# Patient Record
Sex: Female | Born: 1945 | Race: White | Hispanic: No | Marital: Married | Smoking: Never smoker
Health system: Southern US, Community
[De-identification: ages and names within clinical notes are randomized; demographics above are authoritative.]

## PROBLEM LIST (undated history)

## (undated) DIAGNOSIS — I10 Essential (primary) hypertension: Secondary | ICD-10-CM

## (undated) DIAGNOSIS — F419 Anxiety disorder, unspecified: Secondary | ICD-10-CM

## (undated) DIAGNOSIS — E785 Hyperlipidemia, unspecified: Secondary | ICD-10-CM

## (undated) DIAGNOSIS — R0602 Shortness of breath: Secondary | ICD-10-CM

## (undated) DIAGNOSIS — D649 Anemia, unspecified: Secondary | ICD-10-CM

## (undated) DIAGNOSIS — R51 Headache: Secondary | ICD-10-CM

## (undated) DIAGNOSIS — G5600 Carpal tunnel syndrome, unspecified upper limb: Secondary | ICD-10-CM

## (undated) DIAGNOSIS — F32A Depression, unspecified: Secondary | ICD-10-CM

## (undated) DIAGNOSIS — Z8719 Personal history of other diseases of the digestive system: Secondary | ICD-10-CM

## (undated) DIAGNOSIS — M542 Cervicalgia: Secondary | ICD-10-CM

## (undated) DIAGNOSIS — R55 Syncope and collapse: Secondary | ICD-10-CM

## (undated) DIAGNOSIS — G43909 Migraine, unspecified, not intractable, without status migrainosus: Secondary | ICD-10-CM

## (undated) DIAGNOSIS — K922 Gastrointestinal hemorrhage, unspecified: Secondary | ICD-10-CM

## (undated) DIAGNOSIS — E119 Type 2 diabetes mellitus without complications: Secondary | ICD-10-CM

## (undated) DIAGNOSIS — G4730 Sleep apnea, unspecified: Secondary | ICD-10-CM

## (undated) DIAGNOSIS — R011 Cardiac murmur, unspecified: Secondary | ICD-10-CM

## (undated) DIAGNOSIS — I639 Cerebral infarction, unspecified: Secondary | ICD-10-CM

## (undated) DIAGNOSIS — J449 Chronic obstructive pulmonary disease, unspecified: Secondary | ICD-10-CM

## (undated) DIAGNOSIS — I495 Sick sinus syndrome: Secondary | ICD-10-CM

## (undated) DIAGNOSIS — I341 Nonrheumatic mitral (valve) prolapse: Secondary | ICD-10-CM

## (undated) DIAGNOSIS — I4891 Unspecified atrial fibrillation: Secondary | ICD-10-CM

## (undated) DIAGNOSIS — I499 Cardiac arrhythmia, unspecified: Secondary | ICD-10-CM

## (undated) DIAGNOSIS — F329 Major depressive disorder, single episode, unspecified: Secondary | ICD-10-CM

## (undated) DIAGNOSIS — K52831 Collagenous colitis: Secondary | ICD-10-CM

## (undated) DIAGNOSIS — K219 Gastro-esophageal reflux disease without esophagitis: Secondary | ICD-10-CM

## (undated) DIAGNOSIS — M1990 Unspecified osteoarthritis, unspecified site: Secondary | ICD-10-CM

## (undated) HISTORY — PX: TONSILLECTOMY: SUR1361

## (undated) HISTORY — PX: BACK SURGERY: SHX140

## (undated) HISTORY — PX: BREAST CYST ASPIRATION: SHX578

## (undated) HISTORY — PX: LUMBAR LAMINECTOMY: SHX95

## (undated) HISTORY — PX: KNEE ARTHROSCOPY: SHX127

## (undated) HISTORY — PX: JOINT REPLACEMENT: SHX530

## (undated) SURGERY — ESOPHAGOGASTRODUODENOSCOPY (EGD) WITH PROPOFOL
Anesthesia: Monitor Anesthesia Care

## (undated) SURGERY — BRONCHOSCOPY, FLEXIBLE
Anesthesia: Moderate Sedation

---

## 1977-10-06 HISTORY — PX: ABDOMINAL HYSTERECTOMY: SHX81

## 1992-10-06 HISTORY — PX: NASAL SINUS SURGERY: SHX719

## 2004-09-16 ENCOUNTER — Ambulatory Visit: Payer: Self-pay | Admitting: Family Medicine

## 2004-12-27 ENCOUNTER — Emergency Department: Payer: Self-pay | Admitting: General Practice

## 2005-03-29 ENCOUNTER — Emergency Department: Payer: Self-pay | Admitting: Emergency Medicine

## 2005-03-29 ENCOUNTER — Other Ambulatory Visit: Payer: Self-pay

## 2005-10-06 DIAGNOSIS — G4730 Sleep apnea, unspecified: Secondary | ICD-10-CM

## 2005-10-06 HISTORY — DX: Sleep apnea, unspecified: G47.30

## 2006-05-28 ENCOUNTER — Ambulatory Visit: Payer: Self-pay | Admitting: Family Medicine

## 2007-03-02 ENCOUNTER — Ambulatory Visit: Payer: Self-pay | Admitting: Unknown Physician Specialty

## 2007-05-11 ENCOUNTER — Ambulatory Visit: Payer: Self-pay | Admitting: Rheumatology

## 2007-06-05 ENCOUNTER — Inpatient Hospital Stay: Payer: Self-pay | Admitting: Internal Medicine

## 2007-06-05 ENCOUNTER — Other Ambulatory Visit: Payer: Self-pay

## 2007-07-22 ENCOUNTER — Ambulatory Visit: Payer: Self-pay | Admitting: Family Medicine

## 2007-09-08 ENCOUNTER — Ambulatory Visit: Payer: Self-pay | Admitting: Cardiology

## 2007-10-04 ENCOUNTER — Ambulatory Visit: Payer: Self-pay | Admitting: Family Medicine

## 2007-10-13 ENCOUNTER — Ambulatory Visit: Payer: Self-pay | Admitting: Gastroenterology

## 2008-01-03 ENCOUNTER — Ambulatory Visit: Payer: Self-pay | Admitting: Urology

## 2008-03-02 ENCOUNTER — Ambulatory Visit: Payer: Self-pay | Admitting: Rheumatology

## 2008-07-25 ENCOUNTER — Ambulatory Visit: Payer: Self-pay | Admitting: Family Medicine

## 2008-10-06 DIAGNOSIS — K52831 Collagenous colitis: Secondary | ICD-10-CM

## 2008-10-06 HISTORY — DX: Collagenous colitis: K52.831

## 2009-06-01 ENCOUNTER — Emergency Department: Payer: Self-pay | Admitting: Internal Medicine

## 2009-10-03 ENCOUNTER — Ambulatory Visit: Payer: Self-pay | Admitting: Family Medicine

## 2009-10-06 HISTORY — PX: CHOLECYSTECTOMY: SHX55

## 2010-02-15 ENCOUNTER — Emergency Department: Payer: Self-pay | Admitting: Emergency Medicine

## 2010-02-28 ENCOUNTER — Ambulatory Visit: Payer: Self-pay | Admitting: Family Medicine

## 2010-03-26 ENCOUNTER — Ambulatory Visit: Payer: Self-pay | Admitting: Gastroenterology

## 2010-03-26 ENCOUNTER — Encounter: Payer: Self-pay | Admitting: Gastroenterology

## 2010-04-09 ENCOUNTER — Encounter: Payer: Self-pay | Admitting: Gastroenterology

## 2010-04-15 ENCOUNTER — Ambulatory Visit: Payer: Self-pay | Admitting: Surgery

## 2010-05-08 ENCOUNTER — Encounter: Payer: Self-pay | Admitting: Gastroenterology

## 2010-05-08 ENCOUNTER — Inpatient Hospital Stay: Payer: Self-pay | Admitting: Surgery

## 2010-05-09 LAB — PATHOLOGY REPORT

## 2010-05-29 ENCOUNTER — Encounter (INDEPENDENT_AMBULATORY_CARE_PROVIDER_SITE_OTHER): Payer: Self-pay | Admitting: *Deleted

## 2010-07-12 ENCOUNTER — Ambulatory Visit: Payer: Self-pay | Admitting: Gastroenterology

## 2010-08-15 ENCOUNTER — Telehealth: Payer: Self-pay | Admitting: Gastroenterology

## 2010-09-10 ENCOUNTER — Ambulatory Visit: Payer: Self-pay | Admitting: Gastroenterology

## 2010-10-06 HISTORY — PX: CARDIAC CATHETERIZATION: SHX172

## 2010-10-11 ENCOUNTER — Ambulatory Visit: Payer: Self-pay | Admitting: Family Medicine

## 2010-11-07 NOTE — Progress Notes (Signed)
Summary: update  Phone Note Call from Patient Call back at Home Phone [phone]   Caller: Patient Call For: Dr. Christella Hartigan Reason for Call: Talk to Nurse Summary of Call: would like to discuss/update after stopping Entocort and [facility]'s Colon Health Initial call taken by: Vallarie Mare,  August 15, 2010 9:04 AM  Follow-up for Phone Call        Patient calling to report she has been off Entocort and Phillip's colon health since 07/21/10.She had done pretty well until last Friday. Last Friday she had 3 diarrhea stools. She has had one diarrhea stool each day since then until today. Today she has had 3 diarrhea stools already.. She reports she has not had a normal BM since last Friday. Please, advise. Follow-up by: Jesse Fall RN,  August 15, 2010 9:19 AM  Additional Follow-up for Phone Call Additional follow up Details #1::        restart one entocort pill a day and needs rov in 3-4 weeks, (call sooner if diarrhea worsens). Additional Follow-up by: Rachael Fee MD,  August 15, 2010 9:28 AM    Additional Follow-up for Phone Call Additional follow up Details #2::    Patient notified to restart entocort one tablet daily as per Dr. Christella Hartigan. She will call if diarrhea gets worse. Patient scheduled on 09/10/10 at 2:45 PM with Dr. Christella Hartigan. Follow-up by: Jesse Fall RN,  August 15, 2010 1:59 PM  c

## 2010-11-07 NOTE — Letter (Signed)
Summary: [facility]   Imported By: Lester Ross 07/18/2010 09:36:42  _____________________________________________________________________  External Attachment:    Type:   Image     Comment:   External Document

## 2010-11-07 NOTE — Assessment & Plan Note (Signed)
History of Present Illness Visit Type: new patient  Primary GI MD: Rob Bunting MD Primary Provider: Jerl Mina, MD  Requesting Provider: Dr. Dierdre Forth Chief Complaint: LLQ abd pain  History of Present Illness:     very pleasant 65 year old woman who is bothered by terrible profuse diarrhea that lasted for about 3 months and only improved after she had her gallbladder removed. Macroscopic cholecystectomy about 2 months ago showed chronic cholecystitis with adhesions throughout her abdomen.   Workup for this included colonoscopy and also EGD. Biopsies of her colonShowed collagenous colitis.Marland Kitchen     she was having dirrhea, 15 times a day for about 3 months. This was very new for her. She underwent colonoscoyp in June, tics noted, random biopsies showed collagenous colitis.  Pentasa didn't help at all.  She was put on entocort, she could only take 2 pills a day.  She is still on one pill a day. She is actually now pretty constipated.  She will have 3 bms a week.  She takes 2 oxycodone a week for RA pains.           Current Medications (verified): 1)  Zocor 10 Mg Tabs (Simvastatin) .Marland Kitchen.. 1 By Mouth Once Daily 2)  Sertraline Hcl 100 Mg Tabs (Sertraline Hcl) .Marland Kitchen.. 1 1/2 By Mouth Once Daily 3)  Prilosec 20 Mg Cpdr (Omeprazole) .Marland Kitchen.. 1 By Mouth Once Daily 4)  Metoprolol Tartrate 50 Mg Tabs (Metoprolol Tartrate) .Marland Kitchen.. 1 By Mouth Once Daily 5)  Losartan Potassium 100 Mg Tabs (Losartan Potassium) .Marland Kitchen.. 1 By Mouth Once Daily 6)  Zoloft 100 Mg Tabs (Sertraline Hcl) .Marland Kitchen.. 1 1/2 By Mouth Once Daily 7)  Entocort Ec 3 Mg Xr24h-Cap (Budesonide) .... One Capsule By Mouth Once Daily 8)  Phillips Colon Health  Caps (Probiotic Product) .... One Capsule By Mouth Once Daily 9)  Gas-X 80 Mg Chew (Simethicone) .... As Needed  Allergies (verified): 1)  ! Sulfa 2)  ! Inderal 3)  ! Flagyl 4)  ! Relafen 5)  ! Plaquenil 6)  ! * Entocort  Past History:  Past Medical History: Chronic Headaches mitral  valve prolapse Arthritis Hypertension carpal tunnel Obesity Depression Diverticulosis GI Bleed Hemorrhoids Sleep Apnea   Past Surgical History: Rotator Cuff Repair laminectomy Breast Biopsy Hysterectomy Knee Arthroscopy Tonsillectomy cholecystectomy 2001  Family History: no colon cancer  Social History: nonsmoker, nondrinker  Review of Systems       Pertinent positive and negative review of systems were noted in the above HPI and GI specific review of systems.  All other review of systems was otherwise negative.   Vital Signs:  Patient profile:   65 year old female Height:      66 inches Weight:      201 pounds BMI:     32.56 BSA:     2.01 Pulse rate:   62 / minute Pulse rhythm:   regular BP sitting:   138 / 76  (left arm) Cuff size:   regular  Vitals Entered By: Ok Anis CMA (July 12, 2010 2:20 PM)  Physical Exam  Additional Exam:  Constitutional: generally well appearing Psychiatric: alert and oriented times 3 Eyes: extraocular movements intact Mouth: oropharynx moist, no lesions Neck: supple, no lymphadenopathy Cardiovascular: heart regular rate and rythm Lungs: CTA bilaterally Abdomen: soft, non-tender, non-distended, no obvious ascites, no peritoneal signs, normal bowel sounds Extremities: no lower extremity edema bilaterally Skin: no lesions on visible extremities    Impression & Recommendations:  Problem # 1:  recent  profuse diarrhea, cholecystectomy it is unusual for acute diarrhea to improve after cholecystectomy. I have also never seen sick gallbladder cause collagenous colitis as the biopsy of her colon did show. Perhaps her chronic cholecystitis and her collagenous colitis for unrelated but both truly present. Perhaps her collagenous colitis was the result of her cholecystitis, gallbladder pathology as the clinical timing of her improvement does seem to suggest. She is only taking a single Entocort a day and feels very good. She is also  probiotic once daily. I recommended she stop both of these for now to see how she feels. I explained to her that collagenous colitis can come intermittently. She has a return of her significant diarrhea she is to call immediately, otherwise she will report on her symptoms in 4-5 weeks by phone.  Patient Instructions: 1)  Stop the probiotic. 2)  Stop the entocort. 3)  Call Dr. Christella Hartigan office in 4-5 weeks to report on symptoms (diarrhea), much sooner if you get worse. 4)  A copy of this information will be sent to Dr. Dierdre Forth, and Dr. Burnett Sheng. 5)  The medication list was reviewed and reconciled.  All changed / newly prescribed medications were explained.  A complete medication list was provided to the patient / caregiver.

## 2010-11-07 NOTE — Procedures (Signed)
Summary: Donnelly Stager MD  [facility]/Martin Cyndia Skeeters MD   Imported By: Lester [place] 07/18/2010 09:39:52  _____________________________________________________________________  External Attachment:    Type:   Image     Comment:   External Document

## 2010-11-07 NOTE — Letter (Signed)
Summary: New Patient letter  [facility]  [address] [place], [place] [address]   Phone: [phone]  Fax: [phone]       05/29/2010 MRN: [id number]  Caitlin Leonard [address] [place], [place]  [address]  Dear Caitlin Leonard,  Welcome to the Gastroenterology Division at [facility].    You are scheduled to see Dr.  Christella Hartigan  on 07/12/1967 at 2:15 pmon the 3rd floor at [facility], [address]. Please bring Colon and Path report from Dr Marva Panda.   We ask that you try to arrive at  our office 15 minutes prior to your appointment time to allow for check-in. We would like you to complete the enclosed self-administered evaluation form prior to your visit and bring it with you on the day of your appointment.  We will review it with you.  Also, please bring a complete list of all your medications or, if you prefer, bring the medication bottles and we will list them.  Please bring your insurance card so that we may make a copy of it.  If your insurance requires a referral to see a specialist, please bring your referral form from your primary care physician.  Co-payments are due at the time of your visit and may be paid by cash, check or credit card.     Your office visit will consist of a consult with your physician (includes a physical exam), any laboratory testing he/she may order, scheduling of any necessary diagnostic testing (e.g. x-ray, ultrasound, CT-scan), and scheduling of a procedure (e.g. Endoscopy, Colonoscopy) if required.  Please allow enough time on your schedule to allow for any/all of these possibilities.    If you cannot keep your appointment, please call [phone] to cancel or reschedule prior to your appointment date.  This allows [place] the opportunity to schedule an appointment for another patient in need of care.  If you do not cancel or reschedule by 5 p.m. the business day prior to your appointment date, you will be charged a $50.00 late  cancellation/no-show fee.    Thank you for choosing [facility] for your medical needs.  We appreciate the opportunity to care for you.  Please visit [place] at our website  to learn more about our practice.                     Sincerely,                                                             The Gastroenterology Division

## 2010-11-07 NOTE — Assessment & Plan Note (Signed)
  Review of gastrointestinal problems: 1. Collagenous colitis, diagnosed by colonoscopy, biopsies randomly June 2011 Dr. Marva Panda.  Entocort, one pill a day is very effective.    History of Present Illness Visit Type: Follow-up Visit Primary GI MD: Rob Bunting MD Primary Provider: Jerl Mina, MD  Requesting Provider: Dr. Dierdre Forth Chief Complaint: colitis History of Present Illness:     very pleasant 65 year old woman whom I last saw about 2 months ago.  she did well for about one month but then diarrhea returned and she restarted entocort at 1 pill a day about one month ago and the dirrhea responded in 2 days. She still has intermittent lower left sided abd pains after eating.  But stools are back to normal.           Current Medications (verified): 1)  Zocor 10 Mg Tabs (Simvastatin) .Marland Kitchen.. 1 By Mouth Once Daily 2)  Prilosec 20 Mg Cpdr (Omeprazole) .Marland Kitchen.. 1 By Mouth Once Daily 3)  Metoprolol Tartrate 50 Mg Tabs (Metoprolol Tartrate) .Marland Kitchen.. 1 By Mouth Once Daily 4)  Amlodipine Besylate 10 Mg Tabs (Amlodipine Besylate) .... Once Daily 5)  Prozac 20 Mg Caps (Fluoxetine Hcl) .... Once Daily 6)  Gas-X 80 Mg Chew (Simethicone) .... As Needed 7)  Entocort Ec 3 Mg Xr24h-Cap (Budesonide) .... Once Daily  Allergies: 1)  ! Sulfa 2)  ! Inderal 3)  ! Flagyl 4)  ! Plaquenil  Vital Signs:  Patient profile:   65 year old female Height:      66 inches Weight:      203.50 pounds BMI:     32.96 Pulse rate:   72 / minute Pulse rhythm:   regular BP sitting:   120 / 72  (left arm) Cuff size:   regular  Vitals Entered By: June McMurray CMA Duncan Dull) (September 10, 2010 2:39 PM)  Physical Exam  Additional Exam:  Constitutional: generally well appearing Psychiatric: alert and oriented times 3 Abdomen: soft, non-tender, non-distended, normal bowel sounds    Impression & Recommendations:  Problem # 1:  collagenous colitis she will stay on Entocort, one pill a day for the next 2 weeks and  then she will stop. If the symptoms returned she knows she can restart Entocort but she will also call my office to let me know what is going on.  Patient Instructions: 1)  Stay on one entocort pill a day for 2 more weeks, then stop. 2)  If diarrhea symptoms return,  convincingly, restart one entocort pill a day and call Dr. Christella Hartigan' office. 3)  A copy of this information will be sent to Dr. Burnett Sheng. 4)  The medication list was reviewed and reconciled.  All changed / newly prescribed medications were explained.  A complete medication list was provided to the patient / caregiver.

## 2010-11-07 NOTE — Procedures (Signed)
Summary: Glendon Axe MD  Colon/Martin Cyndia Skeeters MD   Imported By: Lester [place] 07/18/2010 09:41:48  _____________________________________________________________________  External Attachment:    Type:   Image     Comment:   External Document

## 2010-11-07 NOTE — Op Note (Signed)
Summary: Quentin Ore MD  Quentin Ore MD   Imported By: Lester [place]itt[place] 07/18/2010 09:35:16  _____________________________________________________________________  External Attachment:    Type:   Image     Comment:   External Document

## 2011-07-02 ENCOUNTER — Emergency Department: Payer: Self-pay | Admitting: Unknown Physician Specialty

## 2011-08-15 ENCOUNTER — Ambulatory Visit: Payer: Self-pay | Admitting: Cardiology

## 2011-08-15 ENCOUNTER — Observation Stay: Payer: Self-pay | Admitting: Student

## 2011-09-05 ENCOUNTER — Encounter: Payer: Self-pay | Admitting: Unknown Physician Specialty

## 2011-09-06 ENCOUNTER — Encounter: Payer: Self-pay | Admitting: Unknown Physician Specialty

## 2011-10-07 ENCOUNTER — Encounter: Payer: Self-pay | Admitting: Unknown Physician Specialty

## 2011-11-06 ENCOUNTER — Ambulatory Visit: Payer: Self-pay | Admitting: Family Medicine

## 2011-11-07 ENCOUNTER — Encounter: Payer: Self-pay | Admitting: Unknown Physician Specialty

## 2011-12-05 ENCOUNTER — Encounter (HOSPITAL_COMMUNITY): Payer: Self-pay | Admitting: Pharmacy Technician

## 2011-12-11 ENCOUNTER — Other Ambulatory Visit: Payer: Self-pay | Admitting: Orthopedic Surgery

## 2011-12-11 ENCOUNTER — Encounter (HOSPITAL_COMMUNITY)
Admission: RE | Admit: 2011-12-11 | Discharge: 2011-12-11 | Disposition: A | Discharge status: Home / Self Care | Payer: BC Managed Care – PPO | Source: Ambulatory Visit | Attending: Orthopedic Surgery | Admitting: Orthopedic Surgery

## 2011-12-11 ENCOUNTER — Other Ambulatory Visit: Payer: Self-pay

## 2011-12-11 ENCOUNTER — Encounter (HOSPITAL_COMMUNITY): Payer: Self-pay

## 2011-12-11 HISTORY — DX: Hyperlipidemia, unspecified: E78.5

## 2011-12-11 HISTORY — DX: Unspecified osteoarthritis, unspecified site: M19.90

## 2011-12-11 HISTORY — DX: Shortness of breath: R06.02

## 2011-12-11 HISTORY — DX: Cardiac arrhythmia, unspecified: I49.9

## 2011-12-11 HISTORY — DX: Gastro-esophageal reflux disease without esophagitis: K21.9

## 2011-12-11 HISTORY — DX: Anxiety disorder, unspecified: F41.9

## 2011-12-11 HISTORY — DX: Cardiac murmur, unspecified: R01.1

## 2011-12-11 HISTORY — DX: Major depressive disorder, single episode, unspecified: F32.9

## 2011-12-11 HISTORY — DX: Syncope and collapse: R55

## 2011-12-11 HISTORY — DX: Headache: R51

## 2011-12-11 HISTORY — DX: Essential (primary) hypertension: I10

## 2011-12-11 HISTORY — DX: Personal history of other diseases of the digestive system: Z87.19

## 2011-12-11 HISTORY — DX: Depression, unspecified: F32.A

## 2011-12-11 HISTORY — DX: Sleep apnea, unspecified: G47.30

## 2011-12-11 HISTORY — DX: Collagenous colitis: K52.831

## 2011-12-11 LAB — PROTIME-INR
INR: 0.96 (ref 0.00–1.49)
Prothrombin Time: 13 seconds (ref 11.6–15.2)

## 2011-12-11 LAB — DIFFERENTIAL
Basophils Relative: 1 % (ref 0–1)
Eosinophils Absolute: 0.2 10*3/uL (ref 0.0–0.7)
Lymphs Abs: 2.3 10*3/uL (ref 0.7–4.0)
Neutro Abs: 7.4 10*3/uL (ref 1.7–7.7)
Neutrophils Relative %: 69 % (ref 43–77)

## 2011-12-11 LAB — URINALYSIS, ROUTINE W REFLEX MICROSCOPIC
Bilirubin Urine: NEGATIVE
Ketones, ur: NEGATIVE mg/dL
Nitrite: NEGATIVE
Specific Gravity, Urine: 1.019 (ref 1.005–1.030)
Urobilinogen, UA: 1 mg/dL (ref 0.0–1.0)

## 2011-12-11 LAB — TYPE AND SCREEN
ABO/RH(D): O POS
Antibody Screen: NEGATIVE

## 2011-12-11 LAB — COMPREHENSIVE METABOLIC PANEL
Alkaline Phosphatase: 91 U/L (ref 39–117)
BUN: 14 mg/dL (ref 6–23)
Creatinine, Ser: 0.77 mg/dL (ref 0.50–1.10)
GFR calc Af Amer: >90 mL/min (ref >90)
Glucose, Bld: 114 mg/dL — ABNORMAL HIGH (ref 70–99)
Potassium: 4 mEq/L (ref 3.5–5.1)
Total Protein: 7.5 g/dL (ref 6.0–8.3)

## 2011-12-11 LAB — APTT: aPTT: 26 seconds (ref 24–37)

## 2011-12-11 LAB — CBC
HCT: 43.8 % (ref 36.0–46.0)
Hemoglobin: 15.1 g/dL — ABNORMAL HIGH (ref 12.0–15.0)
MCH: 31.9 pg (ref 26.0–34.0)
MCHC: 34.5 g/dL (ref 30.0–36.0)
MCV: 92.4 fL (ref 78.0–100.0)
RDW: 13 % (ref 11.5–15.5)

## 2011-12-11 LAB — SURGICAL PCR SCREEN
MRSA, PCR: NEGATIVE
Staphylococcus aureus: NEGATIVE

## 2011-12-11 LAB — ABO/RH: ABO/RH(D): O POS

## 2011-12-11 NOTE — Pre-Procedure Instructions (Signed)
20 Caitlin Leonard  12/11/2011   Your procedure is scheduled on:  Monday, March 18  Report to [facility] at 0530 AM.  Call this number if you have problems the morning of surgery: [phone]   Remember:   Do not eat food:After Midnight.  May have clear liquids: up to 4 Hours before arrival.(1:30 am)  Clear liquids include soda, tea, black coffee, apple or grape juice, broth.  Take these medicines the morning of surgery with A SIP OF WATER: Amlodipine,Prozac,Metoprolol,Omeprazole,Hydrocodone   Do not wear jewelry, make-up or nail polish.  Do not wear lotions, powders, or perfumes. You may wear deodorant.  Do not shave 48 hours prior to surgery.  Do not bring valuables to the hospital.  Contacts, dentures or bridgework may not be worn into surgery.  Leave suitcase in the car. After surgery it may be brought to your room.  For patients admitted to the hospital, checkout time is 11:00 AM the day of discharge.     Special Instructions: Incentive Spirometry - Practice and bring it with you on the day of surgery. and CHG Shower Use Special Wash: 1/2 bottle night before surgery and 1/2 bottle morning of surgery.   Please read over the following fact sheets that you were given: Pain Booklet, Coughing and Deep Breathing, Blood Transfusion Information, Total Joint Packet, MRSA Information and Surgical Site Infection Prevention

## 2011-12-12 LAB — URINE CULTURE

## 2011-12-15 NOTE — Progress Notes (Signed)
RECEIVED REQUESTED MATERIAL [facility].   MATERIAL PLACED ON THE CHART.   CHART GIVEN TO ALLISON FOR REVIEW.

## 2011-12-15 NOTE — Progress Notes (Signed)
Spoke with Morrie Sheldon at the [facility] [phone]...requested neurology notes be faxed to me at [phone].  She will search for notes and fax what she finds.

## 2011-12-15 NOTE — Consult Note (Addendum)
Anesthesia:  Patient is a 66 year old female scheduled for a left TKA on 12/22/11.  Her history includes HTN, OSA, murmur with history of MVP, dysrhythmia, migraine headaches, anxiety, HLD, cataracts, GERD, asthma, non-smoker, IBS, colitis, depression, syncope, and SOB.  Her PCP is Dr. Jerl Mina.  EKG from 12/11/11 showed SB, moderate voltage criteria for LVH (may be normal variant), inferior and anterior T wave abnormality.  These changes are at least somewhat apparent on her EKG from [facility] from 11/22/09, but do appear more pronounced on her current EKG.  She denies CP, SOB.  Until recently, when she had to take care of her parents with dementia, she was going to the riding a stationary bicycle for 30-60 minutes without CV symptoms.    Her Cardiologist is Dr. Harold Hedge.  He last saw her on 09/16/11.  She has been followed for reports of syncope with no cardiac etiology found.  Referral to Neurology was made.    She has had a prior cath at [facility] on 09/08/07 (due to a finding of a anterobasilar perfusion defect on on Adenosine myoview in September 2008.  EF 51%.) that showed normal coronary anatomy.  A 24-hr Holter test showed no significant arrhythmias in August of 2008 and a 30 day event monitor in 2010 revealing no significant arrhythmias.  Her last echo on 05/30/09 showed normal Lv systolic function, EF 55%, mild MI/TI.  Carotid dopplers in 2010 also showed no significant ICA stenosis. According to his note, brain MRI only showed a tiny venous angioma.    Neurology notes from [facility] were received this afternoon, but were from > 10 year ago.  Subsequently, I called Ms. Kittelson and learned she has most recently been followed by Dr. Terrace Arabia at [facility] just a few months ago.  She reports that every 3-4 years she has multiple episodes of brief syncope lasting over a few months.  Her most recent exacerbation was in September 2012 when she had approximately 6 or 7  episodes.  These are preceded by 1-2 minutes of N/V and flushing.  If she is able to lie down once these symptoms develop, she can usually avoid passing out.  Her last episode was 2 weeks ago while straining to have a BM.  The episode lasted for a few seconds.    I will follow-up once her most recent Neurology records are available.    Addendum: 12/17/11 1645  I reviewed records from Neurologist Dr. Terrace Arabia.  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  Dr. Terrace Arabia has not made any specific recommendations so far.  I reviewed her cardiac and syncopal history with Anesthesiologist Dr. Noreene Larsson.  With negative cardiac work-up, normal carotid duplex in 2010, and no acute changes on MRI, would plan to proceed if no new symptomology.

## 2011-12-21 ENCOUNTER — Other Ambulatory Visit: Payer: Self-pay | Admitting: Orthopedic Surgery

## 2011-12-21 MED ORDER — CEFAZOLIN SODIUM-DEXTROSE 2-3 GM-% IV SOLR
2.0000 g | INTRAVENOUS | Status: AC
  Administered 2011-12-22: 2 g via INTRAVENOUS
  Filled 2011-12-21: qty 50

## 2011-12-22 ENCOUNTER — Encounter (HOSPITAL_COMMUNITY): Payer: Self-pay | Admitting: Vascular Surgery

## 2011-12-22 ENCOUNTER — Inpatient Hospital Stay (HOSPITAL_COMMUNITY)
Admission: RE | Admit: 2011-12-22 | Discharge: 2011-12-26 | DRG: 209 | Disposition: A | Discharge status: Home Health | Payer: BC Managed Care – PPO | Source: Ambulatory Visit | Attending: Orthopedic Surgery | Admitting: Orthopedic Surgery

## 2011-12-22 ENCOUNTER — Ambulatory Visit (HOSPITAL_COMMUNITY): Payer: BC Managed Care – PPO | Admitting: Vascular Surgery

## 2011-12-22 ENCOUNTER — Encounter (HOSPITAL_COMMUNITY): Payer: Self-pay | Admitting: *Deleted

## 2011-12-22 ENCOUNTER — Encounter (HOSPITAL_COMMUNITY)
Admission: RE | Disposition: A | Discharge status: Home Health | Payer: Self-pay | Source: Ambulatory Visit | Attending: Orthopedic Surgery

## 2011-12-22 DIAGNOSIS — M1990 Unspecified osteoarthritis, unspecified site: Secondary | ICD-10-CM | POA: Diagnosis present

## 2011-12-22 DIAGNOSIS — G43909 Migraine, unspecified, not intractable, without status migrainosus: Secondary | ICD-10-CM | POA: Diagnosis present

## 2011-12-22 DIAGNOSIS — J45909 Unspecified asthma, uncomplicated: Secondary | ICD-10-CM | POA: Diagnosis present

## 2011-12-22 DIAGNOSIS — I10 Essential (primary) hypertension: Secondary | ICD-10-CM | POA: Diagnosis present

## 2011-12-22 DIAGNOSIS — K219 Gastro-esophageal reflux disease without esophagitis: Secondary | ICD-10-CM | POA: Diagnosis present

## 2011-12-22 DIAGNOSIS — F411 Generalized anxiety disorder: Secondary | ICD-10-CM | POA: Diagnosis present

## 2011-12-22 DIAGNOSIS — M1710 Unilateral primary osteoarthritis, unspecified knee: Principal | ICD-10-CM | POA: Diagnosis present

## 2011-12-22 DIAGNOSIS — Z79899 Other long term (current) drug therapy: Secondary | ICD-10-CM

## 2011-12-22 DIAGNOSIS — D62 Acute posthemorrhagic anemia: Secondary | ICD-10-CM | POA: Diagnosis not present

## 2011-12-22 DIAGNOSIS — M1712 Unilateral primary osteoarthritis, left knee: Secondary | ICD-10-CM

## 2011-12-22 DIAGNOSIS — G4730 Sleep apnea, unspecified: Secondary | ICD-10-CM | POA: Diagnosis present

## 2011-12-22 DIAGNOSIS — E785 Hyperlipidemia, unspecified: Secondary | ICD-10-CM | POA: Diagnosis present

## 2011-12-22 HISTORY — PX: TOTAL KNEE ARTHROPLASTY: SHX125

## 2011-12-22 SURGERY — ARTHROPLASTY, KNEE, TOTAL
Anesthesia: Regional | Site: Knee | Laterality: Left | Wound class: Clean

## 2011-12-22 MED ORDER — SODIUM CHLORIDE 0.9 % IV SOLN
INTRAVENOUS | Status: DC
  Administered 2011-12-22 – 2011-12-23 (×3): via INTRAVENOUS

## 2011-12-22 MED ORDER — ALUM & MAG HYDROXIDE-SIMETH 200-200-20 MG/5ML PO SUSP: 30.0000 mL | ORAL | Status: DC | PRN

## 2011-12-22 MED ORDER — METOPROLOL SUCCINATE ER 50 MG PO TB24
50.0000 mg | ORAL_TABLET | Freq: Every day | ORAL | Status: DC
  Administered 2011-12-23 – 2011-12-26 (×4): 50 mg via ORAL
  Filled 2011-12-22 (×5): qty 1

## 2011-12-22 MED ORDER — BUPIVACAINE 0.25 % ON-Q PUMP SINGLE CATH 300ML
INJECTION | Status: DC | PRN
  Administered 2011-12-22: 300 mL

## 2011-12-22 MED ORDER — BUPIVACAINE-EPINEPHRINE 0.25% -1:200000 IJ SOLN
INTRAMUSCULAR | Status: DC | PRN
  Administered 2011-12-22: 20 mL

## 2011-12-22 MED ORDER — ACETAMINOPHEN 650 MG RE SUPP: 650.0000 mg | Freq: Four times a day (QID) | RECTAL | Status: DC | PRN

## 2011-12-22 MED ORDER — PANTOPRAZOLE SODIUM 40 MG PO TBEC
40.0000 mg | DELAYED_RELEASE_TABLET | Freq: Every day | ORAL | Status: DC
  Administered 2011-12-23 – 2011-12-26 (×4): 40 mg via ORAL
  Filled 2011-12-22 (×3): qty 1

## 2011-12-22 MED ORDER — BUPIVACAINE ON-Q PAIN PUMP (FOR ORDER SET NO CHG): INJECTION | Status: DC

## 2011-12-22 MED ORDER — HYDROMORPHONE HCL PF 1 MG/ML IJ SOLN
0.5000 mg | INTRAMUSCULAR | Status: DC | PRN
  Administered 2011-12-23 (×2): 1 mg via INTRAVENOUS
  Filled 2011-12-22 (×3): qty 1

## 2011-12-22 MED ORDER — PROPOFOL 10 MG/ML IV EMUL
INTRAVENOUS | Status: DC | PRN
  Administered 2011-12-22: 150 mg via INTRAVENOUS
  Administered 2011-12-22: 50 mg via INTRAVENOUS

## 2011-12-22 MED ORDER — LORAZEPAM 1 MG PO TABS: 1.0000 mg | ORAL_TABLET | Freq: Every day | ORAL | Status: DC | PRN

## 2011-12-22 MED ORDER — ONDANSETRON HCL 4 MG PO TABS
4.0000 mg | ORAL_TABLET | Freq: Four times a day (QID) | ORAL | Status: DC | PRN
  Administered 2011-12-25: 4 mg via ORAL
  Filled 2011-12-22: qty 1

## 2011-12-22 MED ORDER — FLUOXETINE HCL 20 MG PO CAPS
40.0000 mg | ORAL_CAPSULE | Freq: Every day | ORAL | Status: DC
Start: 2011-12-22 — End: 2011-12-26
  Administered 2011-12-23 – 2011-12-26 (×4): 40 mg via ORAL
  Filled 2011-12-22 (×5): qty 2

## 2011-12-22 MED ORDER — FLEET ENEMA 7-19 GM/118ML RE ENEM: 1.0000 | ENEMA | Freq: Once | RECTAL | Status: AC | PRN

## 2011-12-22 MED ORDER — METHOCARBAMOL 100 MG/ML IJ SOLN
500.0000 mg | Freq: Four times a day (QID) | INTRAVENOUS | Status: DC | PRN
  Administered 2011-12-22: 500 mg via INTRAVENOUS
  Filled 2011-12-22: qty 5

## 2011-12-22 MED ORDER — EPHEDRINE SULFATE 50 MG/ML IJ SOLN
INTRAMUSCULAR | Status: DC | PRN
  Administered 2011-12-22: 5 mg via INTRAVENOUS
  Administered 2011-12-22: 10 mg via INTRAVENOUS

## 2011-12-22 MED ORDER — CHLORHEXIDINE GLUCONATE 4 % EX LIQD
60.0000 mL | Freq: Once | CUTANEOUS | Status: DC
  Filled 2011-12-22: qty 60

## 2011-12-22 MED ORDER — BUPIVACAINE-EPINEPHRINE PF 0.5-1:200000 % IJ SOLN
INTRAMUSCULAR | Status: DC | PRN
  Administered 2011-12-22: 30 mL

## 2011-12-22 MED ORDER — DIPHENHYDRAMINE HCL 12.5 MG/5ML PO ELIX: 12.5000 mg | ORAL_SOLUTION | ORAL | Status: DC | PRN

## 2011-12-22 MED ORDER — ACETAMINOPHEN 10 MG/ML IV SOLN
1000.0000 mg | Freq: Four times a day (QID) | INTRAVENOUS | Status: DC
  Administered 2011-12-22: 1000 mg via INTRAVENOUS
  Filled 2011-12-22 (×3): qty 100

## 2011-12-22 MED ORDER — BUPIVACAINE 0.25 % ON-Q PUMP SINGLE CATH 300ML
300.0000 mL | INJECTION | Status: DC
  Filled 2011-12-22 (×2): qty 300

## 2011-12-22 MED ORDER — METOCLOPRAMIDE HCL 5 MG PO TABS
5.0000 mg | ORAL_TABLET | Freq: Three times a day (TID) | ORAL | Status: DC | PRN
  Filled 2011-12-22: qty 2

## 2011-12-22 MED ORDER — CEFAZOLIN SODIUM-DEXTROSE 2-3 GM-% IV SOLR
2.0000 g | Freq: Four times a day (QID) | INTRAVENOUS | Status: AC
  Administered 2011-12-22 – 2011-12-23 (×3): 2 g via INTRAVENOUS
  Filled 2011-12-22 (×4): qty 50

## 2011-12-22 MED ORDER — SIMVASTATIN 20 MG PO TABS
20.0000 mg | ORAL_TABLET | Freq: Every evening | ORAL | Status: DC
  Administered 2011-12-23 – 2011-12-25 (×3): 20 mg via ORAL
  Filled 2011-12-22 (×5): qty 1

## 2011-12-22 MED ORDER — METOCLOPRAMIDE HCL 5 MG/ML IJ SOLN: 5.0000 mg | Freq: Three times a day (TID) | INTRAMUSCULAR | Status: DC | PRN

## 2011-12-22 MED ORDER — HYDROMORPHONE HCL PF 1 MG/ML IJ SOLN
0.2500 mg | INTRAMUSCULAR | Status: DC | PRN
  Administered 2011-12-22: 0.25 mg via INTRAVENOUS
  Administered 2011-12-22: 0.5 mg via INTRAVENOUS
  Administered 2011-12-22: 0.25 mg via INTRAVENOUS
  Administered 2011-12-22: 0.5 mg via INTRAVENOUS

## 2011-12-22 MED ORDER — ONDANSETRON HCL 4 MG/2ML IJ SOLN
INTRAMUSCULAR | Status: DC | PRN
  Administered 2011-12-22: 4 mg via INTRAVENOUS

## 2011-12-22 MED ORDER — SODIUM CHLORIDE 0.9 % IR SOLN
Status: DC | PRN
  Administered 2011-12-22: 3000 mL

## 2011-12-22 MED ORDER — ONDANSETRON HCL 4 MG/2ML IJ SOLN
4.0000 mg | Freq: Four times a day (QID) | INTRAMUSCULAR | Status: DC | PRN
  Administered 2011-12-23: 4 mg via INTRAVENOUS
  Filled 2011-12-22: qty 2

## 2011-12-22 MED ORDER — OXYCODONE HCL 5 MG PO TABS
5.0000 mg | ORAL_TABLET | ORAL | Status: DC | PRN
  Administered 2011-12-22 – 2011-12-25 (×11): 10 mg via ORAL
  Administered 2011-12-25: 5 mg via ORAL
  Administered 2011-12-26 (×3): 10 mg via ORAL
  Filled 2011-12-22 (×3): qty 2
  Filled 2011-12-22: qty 1
  Filled 2011-12-22 (×12): qty 2

## 2011-12-22 MED ORDER — AMLODIPINE BESYLATE 5 MG PO TABS
5.0000 mg | ORAL_TABLET | Freq: Every day | ORAL | Status: DC
  Administered 2011-12-23 – 2011-12-26 (×3): 5 mg via ORAL
  Filled 2011-12-22 (×5): qty 1

## 2011-12-22 MED ORDER — IBUPROFEN 600 MG PO TABS
600.0000 mg | ORAL_TABLET | Freq: Three times a day (TID) | ORAL | Status: DC
  Administered 2011-12-22 – 2011-12-26 (×11): 600 mg via ORAL
  Filled 2011-12-22 (×16): qty 1

## 2011-12-22 MED ORDER — DOCUSATE SODIUM 100 MG PO CAPS
100.0000 mg | ORAL_CAPSULE | Freq: Two times a day (BID) | ORAL | Status: DC
  Administered 2011-12-22 – 2011-12-26 (×8): 100 mg via ORAL
  Filled 2011-12-22 (×9): qty 1

## 2011-12-22 MED ORDER — ACETAMINOPHEN 325 MG PO TABS
650.0000 mg | ORAL_TABLET | Freq: Four times a day (QID) | ORAL | Status: DC | PRN
  Administered 2011-12-23: 650 mg via ORAL
  Filled 2011-12-22: qty 2

## 2011-12-22 MED ORDER — ZOLPIDEM TARTRATE 5 MG PO TABS: 5.0000 mg | ORAL_TABLET | Freq: Every evening | ORAL | Status: DC | PRN

## 2011-12-22 MED ORDER — LACTATED RINGERS IV SOLN
INTRAVENOUS | Status: DC | PRN
  Administered 2011-12-22 (×2): via INTRAVENOUS

## 2011-12-22 MED ORDER — PHENOL 1.4 % MT LIQD: 1.0000 | OROMUCOSAL | Status: DC | PRN

## 2011-12-22 MED ORDER — BISACODYL 5 MG PO TBEC: 5.0000 mg | DELAYED_RELEASE_TABLET | Freq: Every day | ORAL | Status: DC | PRN

## 2011-12-22 MED ORDER — OXYCODONE HCL 10 MG PO TB12
10.0000 mg | ORAL_TABLET | Freq: Two times a day (BID) | ORAL | Status: DC
  Administered 2011-12-22 (×2): 10 mg via ORAL
  Filled 2011-12-22 (×2): qty 1

## 2011-12-22 MED ORDER — MENTHOL 3 MG MT LOZG: 1.0000 | LOZENGE | OROMUCOSAL | Status: DC | PRN

## 2011-12-22 MED ORDER — BUDESONIDE 3 MG PO CP24
6.0000 mg | ORAL_CAPSULE | ORAL | Status: DC
  Administered 2011-12-23 – 2011-12-24 (×2): 6 mg via ORAL
  Administered 2011-12-25: 3 mg via ORAL
  Administered 2011-12-26: 6 mg via ORAL
  Filled 2011-12-22 (×7): qty 2

## 2011-12-22 MED ORDER — SENNOSIDES-DOCUSATE SODIUM 8.6-50 MG PO TABS: 1.0000 | ORAL_TABLET | Freq: Every evening | ORAL | Status: DC | PRN

## 2011-12-22 MED ORDER — MIDAZOLAM HCL 5 MG/5ML IJ SOLN
INTRAMUSCULAR | Status: DC | PRN
  Administered 2011-12-22: 2 mg via INTRAVENOUS

## 2011-12-22 MED ORDER — METHOCARBAMOL 500 MG PO TABS
500.0000 mg | ORAL_TABLET | Freq: Four times a day (QID) | ORAL | Status: DC | PRN
  Administered 2011-12-25 – 2011-12-26 (×2): 500 mg via ORAL
  Filled 2011-12-22 (×3): qty 1

## 2011-12-22 MED ORDER — ENOXAPARIN SODIUM 40 MG/0.4ML ~~LOC~~ SOLN
40.0000 mg | SUBCUTANEOUS | Status: DC
  Administered 2011-12-23 – 2011-12-26 (×4): 40 mg via SUBCUTANEOUS
  Filled 2011-12-22 (×5): qty 0.4

## 2011-12-22 MED ORDER — MELATONIN 5 MG PO TABS: 1.0000 | ORAL_TABLET | Freq: Every day | ORAL | Status: DC

## 2011-12-22 MED ORDER — FENTANYL CITRATE 0.05 MG/ML IJ SOLN
INTRAMUSCULAR | Status: DC | PRN
  Administered 2011-12-22: 100 ug via INTRAVENOUS
  Administered 2011-12-22: 25 ug via INTRAVENOUS
  Administered 2011-12-22 (×2): 50 ug via INTRAVENOUS
  Administered 2011-12-22: 25 ug via INTRAVENOUS

## 2011-12-22 MED ORDER — ACETAMINOPHEN 10 MG/ML IV SOLN
INTRAVENOUS | Status: AC
  Filled 2011-12-22: qty 100

## 2011-12-22 MED ORDER — LIDOCAINE HCL (CARDIAC) 20 MG/ML IV SOLN
INTRAVENOUS | Status: DC | PRN
  Administered 2011-12-22: 60 mg via INTRAVENOUS

## 2011-12-22 MED ORDER — SODIUM CHLORIDE 0.9 % IV SOLN: INTRAVENOUS | Status: DC

## 2011-12-22 SURGICAL SUPPLY — 57 items
BANDAGE ELASTIC 6 VELCRO ST LF (GAUZE/BANDAGES/DRESSINGS) ×1 IMPLANT
BANDAGE ESMARK 6X9 LF (GAUZE/BANDAGES/DRESSINGS) ×1 IMPLANT
BLADE SAGITTAL 13X1.27X60 (BLADE) ×2 IMPLANT
BLADE SAW SGTL 83.5X18.5 (BLADE) ×2 IMPLANT
BNDG CMPR 9X6 STRL LF SNTH (GAUZE/BANDAGES/DRESSINGS) ×1
BNDG ESMARK 6X9 LF (GAUZE/BANDAGES/DRESSINGS) ×2
BOWL SMART MIX CTS (DISPOSABLE) ×2 IMPLANT
CATH KIT ON Q 5IN SLV (PAIN MANAGEMENT) ×2 IMPLANT
CEMENT BONE SIMPLEX SPEEDSET (Cement) ×4 IMPLANT
CLOTH BEACON ORANGE TIMEOUT ST (SAFETY) ×2 IMPLANT
COVER BACK TABLE 24X17X13 BIG (DRAPES) ×1 IMPLANT
COVER SURGICAL LIGHT HANDLE (MISCELLANEOUS) ×2 IMPLANT
CUFF TOURNIQUET SINGLE 34IN LL (TOURNIQUET CUFF) ×2 IMPLANT
DRAPE EXTREMITY T 121X128X90 (DRAPE) ×2 IMPLANT
DRAPE INCISE IOBAN 66X45 STRL (DRAPES) ×4 IMPLANT
DRAPE PROXIMA HALF (DRAPES) ×2 IMPLANT
DRAPE U-SHAPE 47X51 STRL (DRAPES) ×2 IMPLANT
DRSG ADAPTIC 3X8 NADH LF (GAUZE/BANDAGES/DRESSINGS) ×2 IMPLANT
DRSG PAD ABDOMINAL 8X10 ST (GAUZE/BANDAGES/DRESSINGS) ×2 IMPLANT
DURAPREP 26ML APPLICATOR (WOUND CARE) ×4 IMPLANT
ELECT REM PT RETURN 9FT ADLT (ELECTROSURGICAL) ×2
ELECTRODE REM PT RTRN 9FT ADLT (ELECTROSURGICAL) ×1 IMPLANT
EVACUATOR 1/8 PVC DRAIN (DRAIN) ×2 IMPLANT
GLOVE BIOGEL M 7.0 STRL (GLOVE) IMPLANT
GLOVE BIOGEL PI IND STRL 7.5 (GLOVE) IMPLANT
GLOVE BIOGEL PI IND STRL 8.5 (GLOVE) ×2 IMPLANT
GLOVE BIOGEL PI INDICATOR 7.5 (GLOVE)
GLOVE BIOGEL PI INDICATOR 8.5 (GLOVE) ×2
GLOVE SURG ORTHO 8.0 STRL STRW (GLOVE) ×4 IMPLANT
GOWN PREVENTION PLUS XLARGE (GOWN DISPOSABLE) ×4 IMPLANT
GOWN STRL NON-REIN LRG LVL3 (GOWN DISPOSABLE) ×4 IMPLANT
HANDPIECE INTERPULSE COAX TIP (DISPOSABLE) ×2
HOOD PEEL AWAY FACE SHEILD DIS (HOOD) ×8 IMPLANT
KIT BASIN OR (CUSTOM PROCEDURE TRAY) ×2 IMPLANT
KIT ROOM TURNOVER OR (KITS) ×2 IMPLANT
MANIFOLD NEPTUNE II (INSTRUMENTS) ×2 IMPLANT
NEEDLE 22X1 1/2 (OR ONLY) (NEEDLE) ×1 IMPLANT
NS IRRIG 1000ML POUR BTL (IV SOLUTION) ×2 IMPLANT
PACK TOTAL JOINT (CUSTOM PROCEDURE TRAY) ×2 IMPLANT
PAD ARMBOARD 7.5X6 YLW CONV (MISCELLANEOUS) ×4 IMPLANT
PADDING CAST COTTON 6X4 STRL (CAST SUPPLIES) ×2 IMPLANT
POSITIONER HEAD PRONE TRACH (MISCELLANEOUS) ×2 IMPLANT
SET HNDPC FAN SPRY TIP SCT (DISPOSABLE) ×1 IMPLANT
SPONGE GAUZE 4X4 12PLY (GAUZE/BANDAGES/DRESSINGS) ×2 IMPLANT
STAPLER VISISTAT 35W (STAPLE) ×2 IMPLANT
SUCTION FRAZIER TIP 10 FR DISP (SUCTIONS) ×2 IMPLANT
SUT BONE WAX W31G (SUTURE) ×2 IMPLANT
SUT VIC AB 0 CTB1 27 (SUTURE) ×4 IMPLANT
SUT VIC AB 1 CT1 27 (SUTURE) ×8
SUT VIC AB 1 CT1 27XBRD ANBCTR (SUTURE) ×4 IMPLANT
SUT VIC AB 2-0 CT1 27 (SUTURE) ×4
SUT VIC AB 2-0 CT1 TAPERPNT 27 (SUTURE) ×2 IMPLANT
SYR CONTROL 10ML LL (SYRINGE) ×1 IMPLANT
TOWEL OR 17X24 6PK STRL BLUE (TOWEL DISPOSABLE) ×2 IMPLANT
TOWEL OR 17X26 10 PK STRL BLUE (TOWEL DISPOSABLE) ×2 IMPLANT
TRAY FOLEY CATH 14FR (SET/KITS/TRAYS/PACK) ×2 IMPLANT
WATER STERILE IRR 1000ML POUR (IV SOLUTION) ×6 IMPLANT

## 2011-12-22 NOTE — Transfer of Care (Signed)
Immediate Anesthesia Transfer of Care Note  Patient: Caitlin Leonard  Procedure(s) Performed: Procedure(s) (LRB): TOTAL KNEE ARTHROPLASTY (Left)  Patient Location: PACU  Anesthesia Type: General  Level of Consciousness: awake, alert  and oriented  Airway & Oxygen Therapy: Patient Spontanous Breathing and Patient connected to nasal cannula oxygen  Post-op Assessment: Report given to PACU RN, Post -op Vital signs reviewed and stable and Patient moving all extremities  Post vital signs: Reviewed and stable  Complications: No apparent anesthesia complications

## 2011-12-22 NOTE — Progress Notes (Signed)
Orthopedic Tech Progress Note Patient Details:  Caitlin Leonard Apr 14, 1946 [id number]  CPM Left Knee CPM Left Knee: On Left Knee Flexion (Degrees): 90  Left Knee Extension (Degrees): 0  Additional Comments: pt in at 10a   Fortune Torosian T 12/22/2011, 10:01 AM

## 2011-12-22 NOTE — Anesthesia Preprocedure Evaluation (Addendum)
Anesthesia Evaluation  Patient identified by MRN, date of birth, ID band Patient awake    Reviewed: Allergy & Precautions, H&P , NPO status , Patient's Chart, lab work & pertinent test results, reviewed documented beta blocker date and time   Airway Mallampati: III TM Distance: >3 FB Neck ROM: Full    Dental No notable dental hx. (+) Teeth Intact and Dental Advisory Given   Pulmonary asthma , sleep apnea ,  Does not use CPAP breath sounds clear to auscultation  Pulmonary exam normal       Cardiovascular hypertension, On Medications, On Home Beta Blockers and Pt. on home beta blockers + dysrhythmias + Valvular Problems/Murmurs MVP Rhythm:Regular Rate:Normal     Neuro/Psych  Headaches, PSYCHIATRIC DISORDERS    GI/Hepatic Neg liver ROS, GERD-  Medicated and Controlled,  Endo/Other  negative endocrine ROSMorbid obesity  Renal/GU negative Renal ROS  negative genitourinary   Musculoskeletal   Abdominal (+) + obese,   Peds  Hematology negative hematology ROS (+)   Anesthesia Other Findings   Reproductive/Obstetrics negative OB ROS                         Anesthesia Physical Anesthesia Plan  ASA: III  Anesthesia Plan: General   Post-op Pain Management:    Induction: Intravenous  Airway Management Planned: LMA  Additional Equipment:   Intra-op Plan:   Post-operative Plan: Extubation in OR  Informed Consent: I have reviewed the patients History and Physical, chart, labs and discussed the procedure including the risks, benefits and alternatives for the proposed anesthesia with the patient or authorized representative who has indicated his/her understanding and acceptance.     Plan Discussed with: CRNA and Anesthesiologist  Anesthesia Plan Comments:        Anesthesia Quick Evaluation

## 2011-12-22 NOTE — H&P (Signed)
Caitlin Leonard MRN:  [id number] DOB/SEX:  1946-06-10/female  CHIEF COMPLAINT:  Painful left Knee  HISTORY: Patient is a 66 y.o. female presented with a history of pain in the left knee. Onset of symptoms was gradual starting several years ago with gradually worsening course since that time. The patient noted no past surgery on the left knee. Prior procedures on the knee include arthroscopy. Patient has been treated conservatively with over-the-counter NSAIDs and activity modification. Patient currently rates pain in the knee at 9 out of 10 with activity. There is pain at night.  PAST MEDICAL HISTORY: There are no active problems to display for this patient.  Past Medical History  Diagnosis Date  . Hypertension     on medication x 10 years  . Heart murmur     MVP ( symptomatic)  . Dysrhythmia   . Asthma     due to seasonal alllergies  . Sleep apnea 2007    does not use CPAP since 40 # wt loss  . GERD (gastroesophageal reflux disease)   . History of IBS   . Collagenous colitis 2010  . Headache     migraines; 2 x month  . Arthritis     osteoarthritis  . Anxiety   . Depression     situational  . Hyperlipemia   . Syncopal episodes   . Cataract   . Shortness of breath     exertional   Past Surgical History  Procedure Date  . Cardiac catheterization 2012    [facility]  . Lumbar laminectomy [id number]  . Back surgery   . Tonsillectomy   . Knee arthroscopy 1985, 1990, 2012 x 2    bilateral  . Cholecystectomy 2011  . Nasal sinus surgery 1994  . Abdominal hysterectomy 1979     MEDICATIONS:   Prescriptions prior to admission  Medication Sig Dispense Refill  . amLODipine (NORVASC) 5 MG tablet Take 5 mg by mouth daily.      . budesonide (ENTOCORT EC) 3 MG 24 hr capsule Take 6 mg by mouth every morning.      Marland Kitchen FLUoxetine (PROZAC) 40 MG capsule Take 40 mg by mouth daily.      Marland Kitchen HYDROcodone-acetaminophen (NORCO) 10-325 MG per tablet Take 1 tablet by mouth every 8 (eight) hours as  needed. For pain      . ibuprofen (ADVIL,MOTRIN) 600 MG tablet Take 600 mg by mouth 2 (two) times daily.      Marland Kitchen LORazepam (ATIVAN) 1 MG tablet Take 1 mg by mouth daily as needed. For migraines      . Melatonin 5 MG TABS Take 1 tablet by mouth at bedtime. For sleep      . metoprolol succinate (TOPROL-XL) 50 MG 24 hr tablet Take 50 mg by mouth daily. Take with or immediately following a meal.      . omeprazole (PRILOSEC) 20 MG capsule Take 20 mg by mouth daily.      . orphenadrine (NORFLEX) 100 MG tablet Take 100 mg by mouth daily as needed. takes with lorazepam for migraines      . simvastatin (ZOCOR) 20 MG tablet Take 20 mg by mouth every evening.        ALLERGIES:   Allergies  Allergen Reactions  . Contrast Media (Iodinated Diagnostic Agents) Anaphylaxis  . Hydroxychloroquine Sulfate Hives  . Metronidazole Other (See Comments)    unknown  . Propranolol Hcl Hives  . Sulfonamide Derivatives Hives    REVIEW OF SYSTEMS:  Pertinent items are  noted in HPI.   FAMILY HISTORY:   Family History  Problem Relation Age of Onset  . Anesthesia problems Neg Hx     SOCIAL HISTORY:   History  Substance Use Topics  . Smoking status: Never Smoker   . Smokeless tobacco: Never Used  . Alcohol Use: Yes     occasional wine     EXAMINATION:  Vital signs in last 24 hours: Temp:  [97.7 F (36.5 C)] 97.7 F (36.5 C) (03/18 0542) Pulse Rate:  [62] 62  (03/18 0542) Resp:  [18] 18  (03/18 0542) BP: (132)/(73) 132/73 mmHg (03/18 0542) SpO2:  [93 %] 93 % (03/18 0542)  General appearance: alert, cooperative, no distress and mildly obese Lungs: clear to auscultation bilaterally Heart: regular rate and rhythm, S1, S2 normal, no murmur, click, rub or gallop Abdomen: soft, non-tender; bowel sounds normal; no masses,  no organomegaly Extremities: extremities normal, atraumatic, no cyanosis or edema and Homans sign is negative, no sign of DVT Pulses: 2+ and symmetric Skin: Skin color, texture,  turgor normal. No rashes or lesions Neurologic: Alert and oriented X 3, normal strength and tone. Normal symmetric reflexes. Normal coordination and gait  Musculoskeletal:  ROM 0-115, Ligaments intact,  Imaging Review Plain radiographs demonstrate severe degenerative joint disease of the left knee. The overall alignment is mild varus. The bone quality appears to be good for age and reported activity level.  Assessment/Plan: End stage arthritis, left knee   The patient history, physical examination and imaging studies are consistent with advanced degenerative joint disease of the left knee. The patient has failed conservative treatment.  The clearance notes were reviewed.  After discussion with the patient it was felt that Total Knee Replacement was indicated. The procedure,  risks, and benefits of total knee arthroplasty were presented and reviewed. The risks including but not limited to aseptic loosening, infection, blood clots, vascular injury, stiffness, patella tracking problems complications among others were discussed. The patient acknowledged the explanation, agreed to proceed with the plan.  Anntoinette Haefele 12/22/2011, 7:10 AM

## 2011-12-22 NOTE — Preoperative (Signed)
Beta Blockers   Reason not to administer Beta Blockers:Not Applicable 

## 2011-12-22 NOTE — Op Note (Signed)
TOTAL KNEE REPLACEMENT OPERATIVE NOTE:  12/22/2011  1:36 PM  PATIENT:  Caitlin Leonard  66 y.o. female  PRE-OPERATIVE DIAGNOSIS:  osteoarthritis left knee  POST-OPERATIVE DIAGNOSIS:  osteoarthritis left knee  PROCEDURE:  Procedure(s): TOTAL KNEE ARTHROPLASTY  SURGEON:  Surgeon(s): Raymon Mutton, MD  PHYSICIAN ASSISTANT: Altamese Cabal, [facility]  ANESTHESIA:   general  DRAINS: Hemovac and On-Q Marcaine Pain Pump  SPECIMEN: None  COUNTS:  Correct  TOURNIQUET:   Total Tourniquet Time Documented: Thigh (Left) - 49 minutes  DICTATION:  Indication for procedure:    The patient is a 66 y.o. female who has failed conservative treatment for osteoarthritis left knee.  Informed consent was obtained prior to anesthesia. The risks versus benefits of the operation were explain and in a way the patient can, and did, understand.   Description of procedure:     The patient was taken to the operating room and placed under anesthesia.  The patient was positioned in the usual fashion taking care that all body parts were adequately padded and/or protected.  I foley catheter was placed.  A tourniquet was applied and the leg prepped and draped in the usual sterile fashion.  The extremity was exsanguinated with the esmarch and tourniquet inflated to 350 mmHg.  Pre-operative range of motion was normal.  The knee was in 3 degree of mild varus.  A midline incision approximately 6-7 inches long was made with a #10 blade.  A new blade was used to make a parapatellar arthrotomy going 2-3 cm into the quadriceps tendon, over the patella, and alongside the medial aspect of the patellar tendon.  A synovectomy was then performed with the #10 blade and forceps. I then elevated the deep MCL off the medial tibial metaphysis subperiosteally around to the semimembranosus attachment.    I everted the patella and used calipers to measure patellar thickness.  I used the reamer to ream down to appropriate thickness to  recreate the native thickness.  I then removed excess bone with the rongeur and sagittal saw.  I used the appropriately sized template and drilled the three lug holes.  I then put the trial in place and measured the thickness with the calipers to ensure recreation of the native thickness.  The trial was then removed and the patella subluxed and the knee brought into flexion.  A homan retractor was place to retract and protect the patella and lateral structures.  A Z-retractor was place medially to protect the medial structures.  The extra-medullary alignment system was used to make cut the tibial articular surface perpendicular to the anamotic axis of the tibia and in 3 degrees of posterior slope.  The cut surface and alignment jig was removed.  I then used the intramedullary alignment guide to make a 6 valgus cut on the distal femur.  I then marked out the epicondylar axis on the distal femur.  The posterior condylar axis measured 3 degrees.  I then used the anterior referencing sizer and measured the femur to be a size E.  The 4-In-1 cutting block was screwed into place in external rotation matching the posterior condylar angle, making our cuts perpendicular to the epicondylar axis.  Anterior, posterior and chamfer cuts were made with the sagittal saw.  The cutting block and cut pieces were removed.  A lamina spreader was placed in 90 degrees of flexion.  The ACL, PCL, menisci, and posterior condylar osteophytes were removed.  A 12 mm spacer blocked was found to offer good flexion  and extension gap balance after minimal in degree releasing.   The scoop retractor was then placed and the femoral finishing block was pinned in place.  The small sagittal saw was used as well as the lug drill to finish the femur.  The block and cut surfaces were removed and the medullary canal hole filled with autograft bone from the cut pieces.  The tibia was delivered forward in deep flexion and external rotation.  A size 5  tray was selected and pinned into place centered on the medial 1/3 of the tibial tubercle.  The reamer and keel was used to prepare the tibia through the tray.    I then trialed with the size E femur, size 5 tibia, a 12 mm insert and the 35 patella.  I had excellent flexion/extension gap balance, excellent patella tracking.  Flexion was full and beyond 120 degrees; extension was zero.  These components were chosen and the staff opened them to me on the back table while the knee was lavaged copiously and the cement mixed.  I cemented in the components and removed all excess cement.  The polyethylene tibial component was snapped into place and the knee placed in extension while cement was hardening.  The capsule was infilltrated with 20cc of .25% Marcaine with epinephrine.  A hemovac was place in the joint exiting superolaterally.  A pain pump was place superomedially superficial to the arthrotomy.  Once the cement was hard, the tourniquet was let down.  Hemostasis was obtained.  The arthrotomy was closed with figure-8 #1 vicryl sutures.  The deep soft tissues were closed with #0 vicryls and the subcuticular layer closed with a running #2-0 vicryl.  The skin was reapproximated and closed with skin staples.  The wound was dressed with xeroform, 4 x4's, 2 ABD sponges, a single layer of webril and a TED stocking.   The patient was then awakened, extubated, and taken to the recovery room in stable condition.  BLOOD LOSS:  300cc DRAINS: 1 hemovac, 1 pain catheter COMPLICATIONS:  None.  PLAN OF CARE: Admit to inpatient   PATIENT DISPOSITION:  PACU - hemodynamically stable.   Delay start of Pharmacological VTE agent (>24hrs) due to surgical blood loss or risk of bleeding:  not applicable  Please fax a copy of this op note to my office at [phone] (please only include page 1 and 2 of the Case Information op note)

## 2011-12-22 NOTE — Anesthesia Postprocedure Evaluation (Signed)
  Anesthesia Post-op Note  Patient: Caitlin Leonard  Procedure(s) Performed: Procedure(s) (LRB): TOTAL KNEE ARTHROPLASTY (Left)  Patient Location: PACU  Anesthesia Type: GA combined with regional for post-op pain  Level of Consciousness: awake and alert   Airway and Oxygen Therapy: Patient Spontanous Breathing and Patient connected to nasal cannula oxygen  Post-op Pain: moderate  Post-op Assessment: Post-op Vital signs reviewed, Patient's Cardiovascular Status Stable, Respiratory Function Stable, Patent Airway and No signs of Nausea or vomiting  Post-op Vital Signs: Reviewed and stable  Complications: No apparent anesthesia complications

## 2011-12-22 NOTE — Progress Notes (Signed)

## 2011-12-22 NOTE — Anesthesia Procedure Notes (Addendum)
Anesthesia Regional Block:  Femoral nerve block  Pre-Anesthetic Checklist: ,, timeout performed, Correct Patient, Correct Site, Correct Laterality, Correct Procedure, Correct Position, site marked, Risks and benefits discussed, pre-op evaluation,  At surgeon's request and post-op pain management  Laterality: Left  Prep: Maximum Sterile Barrier Precautions used and chloraprep       Needles:  Injection technique: Single-shot  Needle Type: Echogenic Stimulator Needle      Needle Gauge: 22 and 22 G    Additional Needles:  Procedures: nerve stimulator Femoral nerve block  Nerve Stimulator or Paresthesia:  Response: Patellar respose, 0.4 mA,   Additional Responses:   Narrative:  Start time: 12/22/2011 7:10 AM End time: 12/22/2011 7:18 AM Injection made incrementally with aspirations every 5 mL. Anesthesiologist: Fitzgerald,MD  Additional Notes: 2% Lidocaine skin wheel.   Femoral nerve block Procedure Name: LMA Insertion Date/Time: 12/22/2011 7:42 AM Performed by: Rogelia Boga Pre-anesthesia Checklist: Patient identified, Emergency Drugs available, Suction available, Patient being monitored and Timeout performed Patient Re-evaluated:Patient Re-evaluated prior to inductionOxygen Delivery Method: Circle system utilized Preoxygenation: Pre-oxygenation with 100% oxygen Intubation Type: IV induction LMA: LMA with gastric port inserted LMA Size: 4.0 Number of attempts: 1 Placement Confirmation: positive ETCO2 and breath sounds checked- equal and bilateral Tube secured with: Tape Dental Injury: Teeth and Oropharynx as per pre-operative assessment

## 2011-12-23 ENCOUNTER — Encounter (HOSPITAL_COMMUNITY): Payer: Self-pay | Admitting: Orthopedic Surgery

## 2011-12-23 LAB — CBC
Hemoglobin: 11.8 g/dL — ABNORMAL LOW (ref 12.0–15.0)
MCH: 31.4 pg (ref 26.0–34.0)
MCHC: 33.4 g/dL (ref 30.0–36.0)
MCV: 93.9 fL (ref 78.0–100.0)
RBC: 3.76 MIL/uL — ABNORMAL LOW (ref 3.87–5.11)

## 2011-12-23 LAB — BASIC METABOLIC PANEL
CO2: 22 mEq/L (ref 19–32)
Calcium: 9 mg/dL (ref 8.4–10.5)
Creatinine, Ser: 0.54 mg/dL (ref 0.50–1.10)
GFR calc non Af Amer: >90 mL/min (ref >90)
Glucose, Bld: 123 mg/dL — ABNORMAL HIGH (ref 70–99)

## 2011-12-23 MED ORDER — OXYCODONE HCL 20 MG PO TB12
20.0000 mg | ORAL_TABLET | Freq: Two times a day (BID) | ORAL | Status: DC
  Administered 2011-12-23 – 2011-12-24 (×4): 20 mg via ORAL
  Filled 2011-12-23 (×4): qty 1

## 2011-12-23 NOTE — Progress Notes (Signed)
Physical Therapy Treatment Note   12/23/11 1700  PT Visit Information  Last PT Received On 12/23/11  Precautions  Precautions Knee  Restrictions  LLE Weight Bearing WBAT  Bed Mobility  Bed Mobility (ther ex in supine only completed )  Total Joint Exercises  Ankle Circles/Pumps AROM;Both;10 reps;Supine  Quad Sets AROM;Left;10 reps;Supine  Heel Slides AAROM;Left;10 reps;Supine (achieve approx 45 degrees)  Short Arc Quad AAROM;Left;10 reps;Supine  Hip ABduction/ADduction AAROM;Left;10 reps;Supine  Straight Leg Raises AAROM;Left;10 reps;Supine  PT - End of Session  Equipment Utilized During Treatment Gait belt  Activity Tolerance (OOB mobility deferred due to fall earlier today)  Patient left in bed;with call bell in reach  General  Behavior During Session [facility] for tasks performed  Cognition [facility] for tasks performed  PT - Assessment/Plan  Comments on Treatment Session RN and pt report patient to have fallen earlier today due to bilat Knee buckling. Patient now reports "I didn't realize how weak I was." Patient with increaed difficulty achieving L quad set. Patient now to strongly benefit from SNF upon d/c to [facility] functional recovery.  PT Frequency 7X/week  Follow Up Recommendations Skilled nursing facility;Supervision/Assistance - 24 hour  Equipment Recommended Defer to next venue  Acute Rehab PT Goals  PT Goal: Perform Home Exercise Program - Progress Progressing toward goal     Lewis Shock, PT, DPT Pager #: [phone] Office #: [phone]

## 2011-12-23 NOTE — Progress Notes (Signed)
Orthopedic Tech Progress Note Patient Details:  Caitlin Leonard 06/28/1946 [id number]  Other Ortho Devices Type of Ortho Device: Knee Immobilizer   Cammer, Mickie Bail 12/23/2011, 12:14 PM Knee immobilizer

## 2011-12-23 NOTE — Progress Notes (Signed)
Caitlin Spurling, MD   Altamese Cabal, PA-C [address] [place], [place], [place]  [address]                             [phone]   PROGRESS NOTE  Subjective:  negative for Chest Pain  negative for Shortness of Breath  negative for Nausea/Vomiting   negative for Calf Pain  negative for Bowel Movement   Tolerating Diet: yes         Patient reports pain as 9 on 0-10 scale.    Objective: Vital signs in last 24 hours:   Patient Vitals for the past 24 hrs:  BP Temp Pulse Resp SpO2  12/23/11 0520 112/86 mmHg 98 F (36.7 C) 77  18  93 %  12/23/11 0416 127/60 mmHg 98.2 F (36.8 C) 60  18  94 %  12/22/11 2121 125/103 mmHg 98.6 F (37 C) 87  18  96 %  12/22/11 1211 109/50 mmHg 97.6 F (36.4 C) 65  16  95 %  12/22/11 1113 - - 64  15  98 %  12/22/11 1112 - - 67  18  97 %  12/22/11 1111 - - 63  13  96 %  12/22/11 1110 - - 60  14  97 %  12/22/11 1109 - - 64  14  98 %  12/22/11 1108 - - 61  16  98 %  12/22/11 1107 - - 65  13  97 %  12/22/11 1106 - - 67  13  98 %  12/22/11 1105 108/61 mmHg - 63  13  98 %  12/22/11 1104 - - 65  14  99 %  12/22/11 1103 - - 62  15  99 %  12/22/11 1102 - - 63  12  98 %  12/22/11 1101 - - 63  14  98 %  12/22/11 1100 - 97.8 F (36.6 C) 63  12  98 %  12/22/11 1059 - - 64  14  98 %  12/22/11 1058 - - 63  12  95 %  12/22/11 1057 - - 64  17  95 %  12/22/11 1056 - - 62  13  95 %  12/22/11 1055 - - 62  12  99 %  12/22/11 1054 - - 62  26  97 %  12/22/11 1053 - - 60  24  100 %  12/22/11 1052 - - 60  14  100 %  12/22/11 1051 - - 59  23  99 %  12/22/11 1050 109/59 mmHg - 60  12  99 %  12/22/11 1049 - - 60  33  100 %  12/22/11 1048 - - 65  17  100 %  12/22/11 1047 - - 62  17  100 %  12/22/11 1046 - - 58  7  100 %  12/22/11 1045 - - 60  16  100 %  12/22/11 1044 - - 59  21  100 %  12/22/11 1043 - - 60  12  100 %  12/22/11 1042 - - 60  20  100 %  12/22/11 1041 - - 58  21  100 %  12/22/11 1040 - - 60  11  100 %  12/22/11 1039 - - 60  13  100 %    12/22/11 1038 - - 58  19  100 %  12/22/11 1037 - - 60  8  100 %  12/22/11  1036 - - 59  13  100 %  12/22/11 1035 117/57 mmHg - 57  15  100 %  12/22/11 1034 - - 58  21  100 %  12/22/11 1033 - - 59  13  100 %  12/22/11 1032 - - 61  14  99 %  12/22/11 1031 - - 60  20  99 %  12/22/11 1030 - - 58  13  99 %  12/22/11 1029 - - 60  14  100 %  12/22/11 1028 - - 59  11  100 %  12/22/11 1027 - - 56  21  100 %  12/22/11 1026 - - 59  15  99 %  12/22/11 1025 - - 62  19  98 %  12/22/11 1024 - - 59  17  99 %  12/22/11 1023 - - 60  14  100 %  12/22/11 1022 - - 58  25  99 %  12/22/11 1021 - - 60  14  100 %  12/22/11 1020 106/57 mmHg - 62  17  99 %  12/22/11 1019 - - 60  13  97 %  12/22/11 1018 - - 57  26  99 %  12/22/11 1017 - - 56  11  100 %  12/22/11 1016 - - 57  17  100 %  12/22/11 1015 - - 58  14  100 %  12/22/11 1014 - - 56  10  100 %  12/22/11 1013 - - 60  21  100 %  12/22/11 1012 - - 57  13  99 %  12/22/11 1011 - - 56  16  99 %  12/22/11 1010 - - 59  14  100 %  12/22/11 1009 - - 59  13  100 %  12/22/11 1008 - - 60  9  100 %  12/22/11 1007 - - 61  13  99 %  12/22/11 1006 - - 59  10  97 %  12/22/11 1005 106/62 mmHg - 58  8  97 %  12/22/11 1004 - - 56  9  98 %  12/22/11 1003 - - 58  13  97 %  12/22/11 1002 - - 59  15  96 %  12/22/11 1001 - - 60  18  97 %  12/22/11 1000 - - 62  23  98 %  12/22/11 0959 - - 63  18  98 %  12/22/11 0958 - - 63  22  99 %  12/22/11 0957 - - 62  12  99 %  12/22/11 0956 - - 63  14  98 %  12/22/11 0955 - - 62  15  98 %  12/22/11 0954 - - 62  11  98 %  12/22/11 0953 - - 61  16  97 %  12/22/11 0952 - - 63  10  98 %  12/22/11 0951 - - 65  13  97 %  12/22/11 0950 - - 65  15  96 %  12/22/11 0949 129/70 mmHg - 62  14  96 %  12/22/11 0948 - - 63  22  96 %  12/22/11 0947 - - 63  17  95 %  12/22/11 0946 - - 64  15  97 %  12/22/11 0945 - - 61  14  96 %  12/22/11 0944 - - 64  14  95 %  12/22/11 0943 - - 63  15  96 %  12/22/11 0942 - -  65  13  95 %  12/22/11  0941 - - 61  15  94 %  12/22/11 0940 - - 61  15  94 %  12/22/11 0939 - - 62  15  96 %  12/22/11 0938 - - 66  13  95 %  12/22/11 0937 - - 63  15  95 %  12/22/11 0936 - - 64  17  94 %  12/22/11 0935 108/73 mmHg - 68  20  94 %  12/22/11 0934 - - 61  - 94 %  12/22/11 0930 - 97.3 F (36.3 C) - - -    @flow {1959:LAST@   Intake/Output from previous day:   03/18 0701 - 03/19 0700 In: 3246.3 [I.V.:2946.3] Out: 1550 [Urine:900; Drains:600]   Intake/Output this shift:       Intake/Output      03/18 0701 - 03/19 0700 03/19 0701 - 03/20 0700   I.V. 1610.3    Other 300    Total Intake 3246.3    Urine 900    Drains 600    Blood 50    Total Output 1550    Net +1696.3            LABORATORY DATA:  Basename 12/23/11 0526  WBC 9.1  HGB 11.8*  HCT 35.3*  PLT 178    Basename 12/23/11 0526  NA 132*  K 4.1  CL 99  CO2 22  BUN 8  CREATININE 0.54  GLUCOSE 123*  CALCIUM 9.0   Lab Results  Component Value Date   INR 0.96 12/11/2011    Examination:  General appearance: alert, cooperative and no distress Extremities: Homans sign is negative, no sign of DVT  Wound Exam: clean, dry, intact   Drainage:  Scant/small amount Serosanguinous exudate  Motor Exam: EHL and FHL Intact  Sensory Exam: Deep Peroneal normal  Vascular Exam:    Assessment:    1 Day Post-Op  Procedure(s) (LRB): TOTAL KNEE ARTHROPLASTY (Left)  ADDITIONAL DIAGNOSIS:  Active Problems:  * No active hospital problems. *   Acute Blood Loss Anemia   Plan: Physical Therapy as ordered Weight Bearing as Tolerated (WBAT)  DVT Prophylaxis:  Lovenox  DISCHARGE PLAN: Home  DISCHARGE NEEDS: HHPT, CPM, Walker and 3-in-1 comode seat         Kemo Spruce 12/23/2011, 8:01 AM

## 2011-12-23 NOTE — Progress Notes (Signed)
Physical Therapy Evaluation Note  Past Medical History  Diagnosis Date  . Hypertension     on medication x 10 years  . Heart murmur     MVP ( symptomatic)  . Dysrhythmia   . Asthma     due to seasonal alllergies  . Sleep apnea 2007    does not use CPAP since 40 # wt loss  . GERD (gastroesophageal reflux disease)   . History of IBS   . Collagenous colitis 2010  . Headache     migraines; 2 x month  . Arthritis     osteoarthritis  . Anxiety   . Depression     situational  . Hyperlipemia   . Syncopal episodes   . Cataract   . Shortness of breath     exertional    Past Surgical History  Procedure Date  . Cardiac catheterization 2012    [facility]  . Lumbar laminectomy [id number]  . Back surgery   . Tonsillectomy   . Knee arthroscopy 1985, 1990, 2012 x 2    bilateral  . Cholecystectomy 2011  . Nasal sinus surgery 1994  . Abdominal hysterectomy 1979  . 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    Procedure: TOTAL KNEE ARTHROPLASTY;  Surgeon: Raymon Mutton, MD;  Location: [facility] OR;  Service: Orthopedics;  Laterality: Left;     12/23/11 0900  PT Visit Information  Last PT Received On 12/23/11  Precautions  Precautions Knee  Restrictions  LLE Weight Bearing WBAT  Home Living  Lives With Spouse  Type of Home House  Home Layout One level  Home Access Stairs to enter  Entrance Stairs-Rails Right  Entrance Stairs-Number of Steps 4  Bathroom Shower/Tub Walk-in Pension scheme manager Yes  How Accessible Accessible via walker  Home Adaptive Equipment Bedside commode/3-in-1;Walker - rolling;Reacher;Sock aid  Additional Comments spouse to be home for the next week and half  Prior Function  Level of Independence Independent with basic ADLs;Independent with gait;Independent with transfers  Able to Take Stairs? Yes  Driving Yes  Vocation Retired  Public house manager  Overall Cognitive Status Appears within functional limits for  tasks assessed  Orientation Level Oriented X4  Sensation  Light Touch Appears Intact  Bed Mobility  Bed Mobility Yes  Supine to Sit 4: Min assist;With rails;HOB flat  Supine to Sit Details (indicate cue type and reason) assist for L LE management, v/c's for sequencing  Sitting - Scoot to Edge of Bed 4: Min assist  Sitting - Scoot to [place] of Bed Details (indicate cue type and reason) assist for L LE management  Transfers  Transfers Yes  Sit to Stand 3: Mod assist;From bed  Sit to Stand Details (indicate cue type and reason) verbal cues for technique/sequencing  Stand Pivot Transfers 1: +2 Total assist (Pt = 70%, + L knee buckling, no KI per MD)  Stand Pivot Transfer Details (indicate cue type and reason) std pvt to chair with max directional verbal cues  Ambulation/Gait  Ambulation/Gait No (unable due nausea and L knee buckling)  Stairs No  Posture/Postural Control  Posture/Postural Control No significant limitations  Balance  Balance Assessed No  RUE Assessment  RUE Assessment WFL  LUE Assessment  LUE Assessment WFL  RLE Assessment  RLE Assessment WFL  LLE Assessment  LLE Assessment Not tested (due to operative leg)  Cervical Assessment  Cervical Assessment [facility]  Thoracic Assessment  Thoracic Assessment [facility]  Lumbar Assessment  Lumbar Assessment [facility]  Exercises  Exercises Total Joint  Total Joint Exercises  Ankle Circles/Pumps AROM;10 reps;Supine;Both  Quad Sets AROM;Left;10 reps;Supine  Heel Slides AAROM;Left;10 reps;Seated (achieved 80 degrees L knee flexion)  PT - End of Session  Equipment Utilized During Treatment Gait belt  Activity Tolerance Patient tolerated treatment well  Patient left in chair;with call bell in reach;with family/visitor present  Nurse Communication Mobility status for transfers;Mobility status for ambulation  General  Behavior During Session [facility] for tasks performed  Cognition [facility] for tasks performed  PT Assessment  Clinical Impression  Statement patient s/p L TKA presenting with decreased L LE strength and decreased active L knee ROM. Patient with h/o of syncope and falls due "fainting." Patient to benefit from skilled PT to maximize strength and ROM to L LE. to return to I function.  PT Recommendation/Assessment Patient will need skilled PT in the acute care venue  PT Problem List Decreased strength;Decreased range of motion;Decreased activity tolerance;Decreased balance;Decreased mobility  Barriers to Discharge Decreased caregiver support (patient spouse only home for 1.5 weeks)  PT Therapy Diagnosis  Difficulty walking;Abnormality of gait;Generalized weakness;Acute pain  PT Plan  PT Frequency 7X/week  PT Treatment/Interventions DME instruction;Gait training;Stair training;Functional mobility training;Therapeutic activities;Therapeutic exercise  PT Recommendation  Follow Up Recommendations Home health PT;Supervision/Assistance - 24 hour  Equipment Recommended None recommended by PT (pt has dme)  Individuals Consulted  Consulted and Agree with Results and Recommendations Patient  Acute Rehab PT Goals  PT Goal Formulation With patient  Time For Goal Achievement 7 days  Pt will go Supine/Side to Sit with modified independence;with HOB 0 degrees  PT Goal: Supine/Side to Sit - Progress Goal set today  Pt will go Sit to Stand with modified independence  PT Goal: Sit to Stand - Progress Goal set today  Pt will Transfer Bed to Chair/Chair to Bed with supervision (with RW)  PT Transfer Goal: Bed to Chair/Chair to Bed - Progress Goal set today  Pt will Ambulate 51 - 150 feet;with modified independence;with rolling walker  PT Goal: Ambulate - Progress Goal set today  Pt will Go Up / Down Stairs 3-5 stairs;with min assist;with rail(s) (and HHA on L UE.)  PT Goal: Up/Down Stairs - Progress Goal set today  Pt will Perform Home Exercise Program Independently  PT Goal: Perform Home Exercise Program - Progress Goal set today      Pain: 5/10 L LE pain  Lewis Shock, PT, DPT Pager #: [phone] Office #: [phone]

## 2011-12-24 LAB — CBC
HCT: 29 % — ABNORMAL LOW (ref 36.0–46.0)
MCHC: 33.1 g/dL (ref 30.0–36.0)
Platelets: 126 10*3/uL — ABNORMAL LOW (ref 150–400)
RDW: 13.7 % (ref 11.5–15.5)
WBC: 7.9 10*3/uL (ref 4.0–10.5)

## 2011-12-24 LAB — BASIC METABOLIC PANEL
BUN: 8 mg/dL (ref 6–23)
Chloride: 99 mEq/L (ref 96–112)
Creatinine, Ser: 0.67 mg/dL (ref 0.50–1.10)
GFR calc Af Amer: >90 mL/min (ref >90)
GFR calc non Af Amer: >90 mL/min (ref >90)
Potassium: 4 mEq/L (ref 3.5–5.1)

## 2011-12-24 NOTE — Progress Notes (Signed)
Occupational Therapy Evaluation Patient Details Name: Caitlin Leonard MRN: [id number] DOB: 1946-08-06 Today's Date: 12/24/2011  Problem List: There is no problem list on file for this patient.   Past Medical History:  Past Medical History  Diagnosis Date  . Hypertension     on medication x 10 years  . Heart murmur     MVP ( symptomatic)  . Dysrhythmia   . Asthma     due to seasonal alllergies  . Sleep apnea 2007    does not use CPAP since 40 # wt loss  . GERD (gastroesophageal reflux disease)   . History of IBS   . Collagenous colitis 2010  . Headache     migraines; 2 x month  . Arthritis     osteoarthritis  . Anxiety   . Depression     situational  . Hyperlipemia   . Syncopal episodes   . Cataract   . Shortness of breath     exertional   Past Surgical History:  Past Surgical History  Procedure Date  . Cardiac catheterization 2012    [facility]  . Lumbar laminectomy [id number]  . Back surgery   . Tonsillectomy   . Knee arthroscopy 1985, 1990, 2012 x 2    bilateral  . Cholecystectomy 2011  . Nasal sinus surgery 1994  . Abdominal hysterectomy 1979  . 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 Pt will benefit from skilled OT to address the problems below to max indep and safety with ADL and mobility for ADL prior to D/C home with husband. Pt will most likely not need OT after D/C - pending progress. OT Recommendation/Assessment: Patient will need skilled OT in the acute care venue OT Problem List: Decreased strength;Decreased range of motion;Decreased activity tolerance;Decreased knowledge of use of DME or AE;Pain Barriers to Discharge: None OT Therapy Diagnosis : Generalized weakness;Acute pain OT Plan OT Frequency: Min 2X/week OT  Treatment/Interventions: Self-care/ADL training;DME and/or AE instruction;Therapeutic activities;Patient/family education OT Recommendation Follow Up Recommendations: No OT follow up;Other (comment) (pending progress) Equipment Recommended: None recommended by OT;Other (comment) (has all DME and AE) Individuals Consulted Consulted and Agree with Results and Recommendations: Patient OT Goals Acute Rehab OT Goals OT Goal Formulation: With patient Time For Goal Achievement: 7 days ADL Goals Pt Will Perform Lower Body Bathing: with modified independence;with adaptive equipment;Unsupported;Sit to stand from chair ADL Goal: Lower Body Bathing - Progress: Goal set today Pt Will Perform Lower Body Dressing: with modified independence;Sit to stand from chair;Unsupported;with adaptive equipment ADL Goal: Lower Body Dressing - Progress: Goal set today Pt Will Transfer to Toilet: with modified independence;with DME;Ambulation;3-in-1 ADL Goal: Toilet Transfer - Progress: Goal set today Pt Will Perform Toileting - Clothing Manipulation: with modified independence;Standing ADL Goal: Toileting - Clothing Manipulation - Progress: Goal set today Pt Will Perform Toileting - Hygiene: Independently;Sit to stand from 3-in-1/toilet ADL Goal: Toileting - Hygiene - Progress: Goal set today Pt Will Perform Tub/Shower Transfer: with supervision;Shower transfer;Ambulation;with DME;Other (comment) (3 in 1) ADL Goal: Tub/Shower Transfer - Progress: Goal set today  OT Evaluation Precautions/Restrictions  Precautions Precautions: Knee Restrictions Weight Bearing Restrictions: Yes LLE Weight Bearing: Weight bearing as tolerated Prior Functioning Home Living Lives With: Spouse Type of Home: House Home Layout: One level Home Access: Stairs to enter Entrance Stairs-Rails: Right Entrance Stairs-Number  of Steps: 4 Bathroom Shower/Tub: Health visitor: Standard Bathroom Accessibility: Yes How  Accessible: Accessible via walker Home Adaptive Equipment: Bedside commode/3-in-1;Walker - rolling;Reacher;Sock aid Additional Comments: spouse to be home for the next week and half Prior Function Level of Independence: Independent with basic ADLs;Independent with gait;Independent with transfers Able to Take Stairs?: Yes Driving: Yes Vocation: Retired Comments: Husband plans to return to work. ADL ADL Eating/Feeding: Independent Where Assessed - Eating/Feeding: Edge of bed Grooming: Simulated;Set up Where Assessed - Grooming: Supported;Supine, head of bed up Upper Body Bathing: Simulated;Set up Lower Body Bathing: Simulated;Minimal assistance Where Assessed - Lower Body Bathing: Sit to stand from bed Upper Body Dressing: Set up Lower Body Dressing: Moderate assistance;Simulated Where Assessed - Lower Body Dressing: Sit to stand from bed Toilet Transfer: Simulated;Supervision/safety Tub/Shower Transfer: Other (comment) (To to assess shower transfer) Ambulation Related to ADLs: supervision ADL Comments: Will benefit from review of AE and practicing shower transfer. Vision/Perception  Vision - History Baseline Vision: Wears glasses all the time Cognition Cognition Arousal/Alertness: Awake/alert Overall Cognitive Status: Appears within functional limits for tasks assessed Orientation Level: Oriented X4 Sensation/Coordination Coordination Gross Motor Movements are Fluid and Coordinated: Yes Fine Motor Movements are Fluid and Coordinated: Yes Extremity Assessment RUE Assessment RUE Assessment: Within Functional Limits LUE Assessment LUE Assessment: Within Functional Limits Mobility  Bed Mobility Supine to Sit: 4: Min assist Supine to Sit Details (indicate cue type and reason): use of bed rail but patient able to manage L LE Sitting - Scoot to Edge of Bed: 5: Supervision Transfers Transfers: Yes Sit to Stand: 5: Supervision;With upper extremity assist;From bed Sit to Stand  Details (indicate cue type and reason): v/cs for hand placement Exercises Total Joint Exercises Short Arc Quad: AROM;Left;10 reps;Seated Heel Slides: AROM;Left;10 reps;Seated (10 AAROM to achieve increased L knee flexion to 90 degrees) End of Session OT - End of Session Equipment Utilized During Treatment: Gait belt Activity Tolerance: Patient tolerated treatment well Patient left: in bed;with call bell in reach General Behavior During Session: [facility] for tasks performed Cognition: [facility] for tasks performed   Ciel Yanes,HILLARY 12/24/2011, 5:03 PM  [facility], OTR/L  [phone] 12/24/2011

## 2011-12-24 NOTE — Progress Notes (Signed)
Physical Therapy Treatment Note   12/24/11 0913  PT Visit Information  Last PT Received On 12/24/11  Precautions  Precautions Knee  Restrictions  LLE Weight Bearing WBAT  Bed Mobility  Bed Mobility Yes  Supine to Sit 4: Min assist (assist with L LE, HOB flat, no use of bed rail)  Sitting - Scoot to [place] of Bed 5: Supervision  Transfers  Sit to Stand 4: Min assist;From bed  Sit to Stand Details (indicate cue type and reason) verbal cues for hand placement  Ambulation/Gait  Ambulation/Gait Yes  Ambulation/Gait Assistance 4: Min assist  Ambulation/Gait Assistance Details (indicate cue type and reason) L KI used, increased time, increased stability and control  Ambulation Distance (Feet) 30 Feet  Assistive device Rolling walker  Gait Pattern Step-to pattern;Decreased step length - left;Decreased stance time - left  Gait velocity decreased  Stairs No  Wheelchair Mobility  Wheelchair Mobility No  Total Joint Exercises  Quad Sets AROM;Left;10 reps;Supine  Heel Slides AROM;Left;10 reps;Seated (achieve 60 degrees L knee flex)  PT - End of Session  Equipment Utilized During Treatment Gait belt;Left knee immobilizer  Activity Tolerance Patient limited by fatigue  Patient left in chair;with family/visitor present;with call bell in reach  Nurse Communication Mobility status for transfers;Mobility status for ambulation  General  Behavior During Session [facility] for tasks performed  Cognition [facility] for tasks performed  PT - Assessment/Plan  Comments on Treatment Session Patient with much improved mobiltiy and ambulation ability this date. Patient with no feeling of whooziness or knee buckling.   PT Plan Discharge plan remains appropriate;Frequency remains appropriate  PT Frequency 7X/week  Follow Up Recommendations Skilled nursing facility;Supervision/Assistance - 24 hour (vs HHPT)  Equipment Recommended Defer to next venue  Acute Rehab PT Goals  PT Goal: Supine/Side to Sit - Progress  Progressing toward goal  PT Goal: Sit to Stand - Progress Progressing toward goal  PT Transfer Goal: Bed to Chair/Chair to Bed - Progress Progressing toward goal  PT Goal: Ambulate - Progress Progressing toward goal  PT Goal: Perform Home Exercise Program - Progress Progressing toward goal    Pain: 5/10 L knee pain  Lewis Shock, PT, DPT Pager #: [phone] Office #: [phone]

## 2011-12-24 NOTE — Progress Notes (Signed)
  Georgena Spurling, MD   Altamese Cabal, PA-C [address] [place], [place], [place]  [address]                             [phone]   PROGRESS NOTE  Subjective:  negative for Chest Pain  negative for Shortness of Breath  negative for Nausea/Vomiting   negative for Calf Pain  negative for Bowel Movement   Tolerating Diet: yes         Patient reports pain as 6 on 0-10 scale.    Objective: Vital signs in last 24 hours:   Patient Vitals for the past 24 hrs:  BP Temp Temp src Pulse Resp SpO2  12/24/11 1214 108/54 mmHg - - - - -  12/24/11 1022 105/52 mmHg - - - - 88 %  12/24/11 0750 120/50 mmHg 97.4 F (36.3 C) Oral 67  16  88 %  12/24/11 0656 - 98.9 F (37.2 C) Oral 69  20  94 %  12/23/11 2044 127/45 mmHg 100 F (37.8 C) Oral 77  18  93 %  12/23/11 1424 110/55 mmHg 98.6 F (37 C) Oral 67  18  90 %    @flow {1959:LAST@   Intake/Output from previous day:   03/19 0701 - 03/20 0700 In: 360 [P.O.:360] Out: 52 [Urine:2; Drains:50]   Intake/Output this shift:   03/20 0701 - 03/20 1900 In: 120 [P.O.:120] Out: 250 [Urine:250]   Intake/Output      03/19 0701 - 03/20 0700 03/20 0701 - 03/21 0700   P.O. 360 120   I.V.  0   Other     Total Intake 360 120   Urine 2 250   Drains 50    Blood     Total Output 52 250   Net +308 -130        Emesis Occurrence 1 x       LABORATORY DATA:  Basename 12/24/11 0530 12/23/11 0526  WBC 7.9 9.1  HGB 9.6* 11.8*  HCT 29.0* 35.3*  PLT 126* 178    Basename 12/24/11 0530 12/23/11 0526  NA 131* 132*  K 4.0 4.1  CL 99 99  CO2 25 22  BUN 8 8  CREATININE 0.67 0.54  GLUCOSE 112* 123*  CALCIUM 8.8 9.0   Lab Results  Component Value Date   INR 0.96 12/11/2011    Examination:  General appearance: alert, cooperative and no distress Extremities: Homans sign is negative, no sign of DVT  Wound Exam: clean, dry, intact   Drainage:  None: wound tissue dry  Motor Exam: EHL and FHL Intact  Sensory Exam: Deep Peroneal  normal  Vascular Exam:    Assessment:    2 Days Post-Op  Procedure(s) (LRB): TOTAL KNEE ARTHROPLASTY (Left)  ADDITIONAL DIAGNOSIS:  Active Problems:  * No active hospital problems. *   Acute Blood Loss Anemia   Plan: Physical Therapy as ordered Weight Bearing as Tolerated (WBAT)  DVT Prophylaxis:  Lovenox  DISCHARGE PLAN: Home  DISCHARGE NEEDS: HHPT, CPM, Walker and 3-in-1 comode seat         Kharon Hixon 12/24/2011, 12:44 PM

## 2011-12-24 NOTE — Progress Notes (Signed)
Physical Therapy Treatment Note   12/24/11 1524  PT Visit Information  Last PT Received On 12/24/11  Precautions  Precautions Knee  Restrictions  LLE Weight Bearing WBAT  Bed Mobility  Supine to Sit 4: Min assist  Supine to Sit Details (indicate cue type and reason) use of bed rail but patient able to manage L LE  Sitting - Scoot to Edge of Bed 5: Supervision  Transfers  Sit to Stand 4: Min assist  Sit to Stand Details (indicate cue type and reason) v/cs for hand placement  Ambulation/Gait  Ambulation/Gait Assistance 4: Min assist  Ambulation/Gait Assistance Details (indicate cue type and reason) patient able to amb without L KI, pt able to complete L QS during stance phase, no knee buckling this date  Ambulation Distance (Feet) 30 Feet  Assistive device Rolling walker  Gait Pattern Step-to pattern;Decreased step length - left;Decreased stance time - left  Gait velocity decreased  Total Joint Exercises  Heel Slides AROM;Left;10 reps;Seated (10 AAROM to achieve increased L knee flexion to 90 degrees)  Short Arc Quad AROM;Left;10 reps;Seated  PT - End of Session  Equipment Utilized During Treatment Gait belt  Patient left in chair;with call bell in reach  Nurse Communication Mobility status for transfers;Mobility status for ambulation  General  Behavior During Session [facility] for tasks performed  Cognition [facility] for tasks performed  PT - Assessment/Plan  Comments on Treatment Session Patient ambulation limited by onset of lightheadedness however tolerated ambulation without KI very well. Plan is to trial stair negotiation 3/21 in the AM.  Follow Up Recommendations Skilled nursing facility (home vs SNF, spouse not to be avail after next week)  Acute Rehab PT Goals  PT Goal: Supine/Side to Sit - Progress Progressing toward goal  PT Goal: Sit to Stand - Progress Progressing toward goal  PT Transfer Goal: Bed to Chair/Chair to Bed - Progress Progressing toward goal  PT Goal: Ambulate -  Progress Progressing toward goal  PT Goal: Perform Home Exercise Program - Progress Progressing toward goal    Pain: intially 3/10, s/p pt 7.5/10 in L Knee  Lewis Shock, PT, DPT Pager #: [phone] Office #: [phone]

## 2011-12-25 LAB — BASIC METABOLIC PANEL
Chloride: 100 mEq/L (ref 96–112)
GFR calc Af Amer: >90 mL/min (ref >90)
GFR calc non Af Amer: >90 mL/min (ref >90)
Potassium: 4 mEq/L (ref 3.5–5.1)
Sodium: 134 mEq/L — ABNORMAL LOW (ref 135–145)

## 2011-12-25 LAB — CBC
Hemoglobin: 9.8 g/dL — ABNORMAL LOW (ref 12.0–15.0)
MCHC: 33.7 g/dL (ref 30.0–36.0)
RDW: 13.4 % (ref 11.5–15.5)
WBC: 6.2 10*3/uL (ref 4.0–10.5)

## 2011-12-25 MED ORDER — WHITE PETROLATUM GEL
Status: AC
  Administered 2011-12-25: 15:00:00
  Filled 2011-12-25: qty 5

## 2011-12-25 NOTE — Plan of Care (Signed)
Problem: Consults Goal: Diagnosis- Total Joint Replacement Outcome: Completed/Met Date Met:  12/25/11 Primary Total Knee  Problem: Phase III Progression Outcomes Goal: Discharge plan remains appropriate-arrangements made Outcome: Progressing Still home vs. snf depending on pt meeting PT goals

## 2011-12-25 NOTE — Progress Notes (Signed)
CARE MANAGEMENT NOTE 12/25/2011    Action/Plan:   Patient was preoperatively setup with [facility], but wants to go to SNF for shortterm rehab.   Anticipated DC Date:  12/26/2011   Anticipated DC Plan:  SKILLED NURSING FACILITY  In-house referral  Clinical Social Worker      DC Planning Services  CM consult      [facility] Choice  NA   Choice offered to / List presented to:  NA   DME arranged  NA      DME agency  NA     HH arranged  NA      Status of service:  Completed, signed off    Discharge Disposition:  SKILLED NURSING FACILITY

## 2011-12-25 NOTE — Progress Notes (Signed)
Physical Therapy Treatment Note   12/25/11 1400  PT Visit Information  Last PT Received On 12/25/11  Precautions  Precautions Knee  Restrictions  LLE Weight Bearing WBAT  Bed Mobility  Supine to Sit 5: Supervision  Supine to Sit Details (indicate cue type and reason) no use of bed rail to mimic home, increased time required but able to complete task  Sitting - Scoot to Edge of Bed 5: Supervision  Transfers  Sit to Stand 4: Min assist  Sit to Stand Details (indicate cue type and reason) minA from bed, supervision from armed chair  Ambulation/Gait  Ambulation/Gait Assistance 4: Min assist  Ambulation/Gait Assistance Details (indicate cue type and reason) patient with improved sequencing and walker management demonstrating safe technique  Ambulation Distance (Feet) 50 Feet  Assistive device Rolling walker  Gait Pattern Step-to pattern;Decreased step length - left;Decreased stance time - left;Antalgic  Gait velocity decreased, however much improved from yesterday  Stairs Yes  Stairs Assistance 4: Min assist  Stairs Assistance Details (indicate cue type and reason) completed both platform step forward with RW and 4 STE home, spouse present and returned demonstrated good understanding and assist to patient  Stair Management Technique Two rails;Forwards  Number of Stairs 4   PT - End of Session  Equipment Utilized During Treatment Gait belt  Activity Tolerance (patient became pale during stair negotiation, BP 118/58, 123/57)  Patient left in bed;with call bell in reach;with family/visitor present  Nurse Communication Mobility status for transfers;Mobility status for ambulation  General  Behavior During Session [facility] for tasks performed  Cognition [facility] for tasks performed  PT - Assessment/Plan  Comments on Treatment Session Patient with improved moral and improved recall re: transfers and walker management during ambulation however remains to have difficulty with recall for proper stair  negotiation sequencing however sposue able to recall and remind patient. Patient remains to require 24/7 supervision due to h/o syncope and occasional episodes of "zoning out". Patient unsafe to return home alone at this time but is safe to return home with 24/7 assist/supervision and HHPT.  PT Plan Discharge plan remains appropriate;Frequency remains appropriate  PT Frequency 7X/week  Follow Up Recommendations Skilled nursing facility (vs HHPT with 24/7 supervision/assist)  Equipment Recommended None recommended by PT  Acute Rehab PT Goals  PT Goal: Supine/Side to Sit - Progress Progressing toward goal  PT Goal: Sit to Stand - Progress Progressing toward goal  PT Transfer Goal: Bed to Chair/Chair to Bed - Progress Progressing toward goal  PT Goal: Ambulate - Progress Progressing toward goal  PT Goal: Up/Down Stairs - Progress Progressing toward goal  PT Goal: Perform Home Exercise Program - Progress Progressing toward goal    Pain: 8/10 during there ex  Lewis Shock, PT, DPT Pager #: [phone] Office #: [phone]

## 2011-12-25 NOTE — Progress Notes (Signed)
  Georgena Spurling, MD   Altamese Cabal, PA-C [address] [place], [place], [place]  [address]                             [phone]   PROGRESS NOTE  Subjective:  negative for Chest Pain  negative for Shortness of Breath  positive for Nausea/Vomiting   negative for Calf Pain  negative for Bowel Movement   Tolerating Diet: yes         Patient reports pain as 5 on 0-10 scale.    Objective: Vital signs in last 24 hours:   Patient Vitals for the past 24 hrs:  BP Temp Temp src Pulse Resp SpO2  12/25/11 0624 118/60 mmHg 98 F (36.7 C) - 65  18  95 %  12/24/11 2100 122/51 mmHg 98.1 F (36.7 C) - 71  18  94 %  12/24/11 1225 108/54 mmHg 98.2 F (36.8 C) Oral 66  18  90 %  12/24/11 1214 108/54 mmHg - - - - -    @flow {1959:LAST@   Intake/Output from previous day:   03/20 0701 - 03/21 0700 In: 600 [P.O.:600] Out: 550 [Urine:550]   Intake/Output this shift:   03/21 0701 - 03/21 1900 In: -  Out: 400 [Urine:400]   Intake/Output      03/20 0701 - 03/21 0700 03/21 0701 - 03/22 0700   P.O. 600    Total Intake 600    Urine 550 400   Drains     Total Output 550 400   Net +50 -400        Urine Occurrence 1 x       LABORATORY DATA:  Basename 12/25/11 0731 12/24/11 0530 12/23/11 0526  WBC 6.2 7.9 9.1  HGB 9.8* 9.6* 11.8*  HCT 29.1* 29.0* 35.3*  PLT 175 126* 178    Basename 12/25/11 0731 12/24/11 0530 12/23/11 0526  NA 134* 131* 132*  K 4.0 4.0 4.1  CL 100 99 99  CO2 28 25 22   BUN 10 8 8   CREATININE 0.66 0.67 0.54  GLUCOSE 142* 112* 123*  CALCIUM 9.0 8.8 9.0   Lab Results  Component Value Date   INR 0.96 12/11/2011    Examination:  General appearance: alert, cooperative and no distress Extremities: Homans sign is negative, no sign of DVT  Wound Exam: clean, dry, intact   Drainage:  None: wound tissue dry  Motor Exam: EHL and FHL Intact  Sensory Exam: Deep Peroneal normal  Vascular Exam:    Assessment:    3 Days Post-Op  Procedure(s) (LRB): TOTAL  KNEE ARTHROPLASTY (Left)  ADDITIONAL DIAGNOSIS:  Active Problems:  * No active hospital problems. *   Acute Blood Loss Anemia   Plan: Physical Therapy as ordered Weight Bearing as Tolerated (WBAT)  DVT Prophylaxis:  Lovenox  DISCHARGE PLAN: Home vs. snf  DISCHARGE NEEDS: HHPT, CPM, Walker and 3-in-1 comode seat         Bernisha Verma 12/25/2011, 10:46 AM

## 2011-12-25 NOTE — Progress Notes (Signed)
Utilization review completed. Danniela Mcbrearty, RN, BSN. 12/25/11  

## 2011-12-25 NOTE — Consult Note (Signed)
Clinical Social Work Department BRIEF PSYCHOSOCIAL ASSESSMENT 12/25/2011  Patient:  Caitlin Leonard, Caitlin Leonard     Account Number:  [id number]     Admit date:  12/22/2011  Clinical Social Worker:  Thomasene Mohair  Date/Time:  12/25/2011 11:00 AM  Referred by:  Physician  Date Referred:  12/24/2011 Referred for  SNF Placement   Other Referral:   Interview type:  Patient Other interview type:    PSYCHOSOCIAL DATA Living Status:  HUSBAND Admitted from facility:   Level of care:   Primary support name:  Caitlin Leonard/ cell [phone] Primary support relationship to patient:  SPOUSE Degree of support available:   Good    CURRENT CONCERNS Current Concerns  Post-Acute Placement   Other Concerns:    SOCIAL WORK ASSESSMENT / PLAN CSW met with Pt to discuss dc plans. 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 Pt is agreeable to snf at dc for additional rehab.  Pt's husband is at bedside and is agreeable to plan as well. CSW will begin bed search in [facility].   Assessment/plan status:   Other assessment/ plan:   Information/referral to community resources:    PATIENT'S/FAMILY'S RESPONSE TO PLAN OF CARE: Pt and Pt's spouse agree that snf would offer pt the most rehab and nursing care benefit after dc from hospital. Family aware that insurance preauth is required.    Caitlin Hamman, LCSW [phone]  Covering for Caitlin Fava, LCSW

## 2011-12-25 NOTE — Consult Note (Signed)
Clinical Social Work Department BRIEF PSYCHOSOCIAL ASSESSMENT 12/25/2011  Patient:  Caitlin Leonard     Account Number:  [id number]     Admit date:  12/23/2011  Clinical Social Worker:  Thomasene Mohair  Date/Time:  12/25/2011 11:30 AM  Referred by:  Physician  Date Referred:  12/25/2011 Referred for  SNF Placement   Other Referral:   Interview type:  Patient Other interview type:    PSYCHOSOCIAL DATA Living Status:  ALONE Admitted from facility:   Level of care:   Primary support name:  Edwena Blow cell [phone] Primary support relationship to patient:  FAMILY Degree of support available:   granddaughter, good support.    CURRENT CONCERNS Current Concerns  Post-Acute Placement   Other Concerns:    SOCIAL WORK ASSESSMENT / PLAN CSW met with pt to discuss dc plans. Pt prefers to dc home vs a family member's house vs a SNF. Pt lives alone and recognizes that the recommendation is snf at dc. Pt is agreeable to information being sent out to search for bed availability.   Assessment/plan status:  Psychosocial Support/Ongoing Assessment of Needs Other assessment/ plan:   Information/referral to community resources:    PATIENT'S/FAMILY'S RESPONSE TO PLAN OF CARE: Pt prefers to go home at [facility] but understands concerns of medical team.  Pt will talk to son and granddaughter re: bed offers and dc plan.    Frederico Hamman, LCSW [phone] covering for Bonnye Fava, LCSW

## 2011-12-25 NOTE — Progress Notes (Signed)
OT Cancellation Note  Treatment cancelled today due to pt. declining due to in CPM. Will re-attempt 3/22 in A.Mitchell Heir, OTR/L Pager [phone] 12/25/2011, 1:55 PM

## 2011-12-25 NOTE — Progress Notes (Signed)
Physical Therapy Treatment Note   12/25/11 0947  PT Visit Information  Last PT Received On 12/25/11  Precautions  Precautions Knee  Restrictions  LLE Weight Bearing WBAT  Bed Mobility  Supine to Sit 5: Supervision  Supine to Sit Details (indicate cue type and reason) use of bed rail, increased time required, + nausea upon achieving sitting EOB  Transfers  Sit to Stand 3: Mod assist  Sit to Stand Details (indicate cue type and reason) v/cs for hand placement and technique, pt with impaired/delayed processing this date compared to yesterday and broke down crying/emotional due to frustration   Stand Pivot Transfers 3: Mod assist  Stand Pivot Transfer Details (indicate cue type and reason) std pvt to chair only due to nausea, max directional verbal cues for sequencing due to patient emotional/upset  Ambulation/Gait  Ambulation/Gait No (pt limited by nausea and pt being upset)  Wheelchair Mobility  Wheelchair Mobility No  Posture/Postural Control  Posture/Postural Control No significant limitations  Balance  Balance Assessed No  Total Joint Exercises  Heel Slides AROM;Left;10 reps;Seated  Short Arc Quad AROM;Left;10 reps;Seated  PT - End of Session  Equipment Utilized During Treatment Gait belt  Activity Tolerance (limited by nausea and being upset)  Patient left in chair;with call bell in reach;with family/visitor present  Nurse Communication Mobility status for transfers;Mobility status for ambulation (alerted RN re: nausea, RN provided medication)  General  Behavior During Session [facility] for tasks performed  Cognition (demonstrated increased recall difficulty with transfer technique and walker safety)  PT - Assessment/Plan  Comments on Treatment Session Patient greatly limited this AM due to becoming emotional regarding difficulty recalling yesterdays transfer technique and walker management in addition to thoughts of going to a rehab facility. Plan is to see patient again in PM.    PT Plan Discharge plan remains appropriate  PT Frequency 7X/week  Follow Up Recommendations Skilled nursing facility (vs HHPT with 24/7 supervision/assist)  Equipment Recommended None recommended by PT  Acute Rehab PT Goals  PT Goal: Supine/Side to Sit - Progress Progressing toward goal  PT Goal: Sit to Stand - Progress Progressing toward goal  PT Transfer Goal: Bed to Chair/Chair to Bed - Progress Progressing toward goal  PT Goal: Perform Home Exercise Program - Progress Progressing toward goal    Pain: 8/10 with increased L knee flexion  Lewis Shock, PT, DPT Pager #: [phone] Office #: [phone]

## 2011-12-26 LAB — BASIC METABOLIC PANEL
CO2: 26 mEq/L (ref 19–32)
Chloride: 101 mEq/L (ref 96–112)
GFR calc Af Amer: >90 mL/min (ref >90)
Potassium: 3.5 mEq/L (ref 3.5–5.1)

## 2011-12-26 MED ORDER — ENOXAPARIN SODIUM 40 MG/0.4ML ~~LOC~~ SOLN: 40.0000 mg | SUBCUTANEOUS | Status: DC

## 2011-12-26 MED ORDER — METHOCARBAMOL 500 MG PO TABS: 500.0000 mg | ORAL_TABLET | Freq: Four times a day (QID) | ORAL | Status: AC | PRN

## 2011-12-26 MED ORDER — OXYCODONE HCL 5 MG PO TABS: 5.0000 mg | ORAL_TABLET | ORAL | Status: AC | PRN

## 2011-12-26 NOTE — Discharge Summary (Signed)
PATIENT ID:      Caitlin Leonard  MRN:     [id number] DOB/AGE:    July 16, 1946 / 66 y.o.     DISCHARGE SUMMARY  ADMISSION DATE:    12/22/2011 DISCHARGE DATE:   12/26/2011   ADMISSION DIAGNOSIS: osteoarthritis left knee  (osteoarthritis left knee)  DISCHARGE DIAGNOSIS:  osteoarthritis left knee    ADDITIONAL DIAGNOSIS: Active Problems:  * No active hospital problems. *   Past Medical History  Diagnosis Date  . Hypertension     on medication x 10 years  . Heart murmur     MVP ( symptomatic)  . Dysrhythmia   . Asthma     due to seasonal alllergies  . Sleep apnea 2007    does not use CPAP since 40 # wt loss  . GERD (gastroesophageal reflux disease)   . History of IBS   . Collagenous colitis 2010  . Headache     migraines; 2 x month  . Arthritis     osteoarthritis  . Anxiety   . Depression     situational  . Hyperlipemia   . Syncopal episodes   . Cataract   . Shortness of breath     exertional    PROCEDURE: Procedure(s): TOTAL KNEE ARTHROPLASTY on 12/22/2011  CONSULTS:     HISTORY:  See H&P in chart  HOSPITAL COURSE:  Caitlin Leonard is a 66 y.o. admitted on 12/22/2011 and found to have a diagnosis of osteoarthritis left knee.  After appropriate laboratory studies were obtained  they were taken to the operating room on 12/22/2011 and underwent Procedure(s): TOTAL KNEE ARTHROPLASTY.   They were given perioperative antibiotics:  Anti-infectives     Start     Dose/Rate Route Frequency Ordered Stop   12/22/11 1200   ceFAZolin (ANCEF) IVPB 2 g/50 mL premix        2 g 100 mL/hr over 30 Minutes Intravenous Every 6 hours 12/22/11 1157 12/23/11 0047   12/22/11 0800   ceFAZolin (ANCEF) IVPB 2 g/50 mL premix        2 g 100 mL/hr over 30 Minutes Intravenous 60 min pre-op 12/21/11 1405 12/22/11 0745        .  Tolerated the procedure well.  Placed with a foley intraoperatively.  Given Ofirmev at induction and for 48 hours.  PCA for analgesia.   POD #1, allowed out of bed  to a chair.  PT for ambulation and exercise program.  Foley D/C'd in morning.  IV saline locked.  O2 discontionued.  POD #2, continued PT and ambulation .  The remainder of the hospital course was dedicated to ambulation and strengthening.   The patient was discharged on 4 Days Post-Op in  Good condition.  Blood products given:none  DIAGNOSTIC STUDIES: Recent vital signs: Patient Vitals for the past 24 hrs:  BP Temp Pulse Resp SpO2  12/26/11 0558 118/58 mmHg 97.7 F (36.5 C) 70  18  94 %  12/25/11 2342 121/56 mmHg 97.3 F (36.3 C) 74  18  90 %  12/25/11 1254 125/46 mmHg 97.2 F (36.2 C) 75  18  -       Recent laboratory studies:  Basename 12/25/11 0731 01-01-12 0530 12/23/11 0526  WBC 6.2 7.9 9.1  HGB 9.8* 9.6* 11.8*  HCT 29.1* 29.0* 35.3*  PLT 175 126* 178    Basename 12/26/11 0625 12/25/11 0731 01-01-2012 0530 12/23/11 0526  NA 135 134* 131* 132*  K 3.5 4.0 4.0 4.1  CL  101 100 99 99  CO2 26 28 25 22   BUN 9 10 8 8   CREATININE 0.66 0.66 0.67 0.54  GLUCOSE 108* 142* 112* 123*  CALCIUM 9.2 9.0 8.8 9.0   Lab Results  Component Value Date   INR 0.96 12/11/2011     Recent Radiographic Studies :  Dg Chest 2 View  12/11/2011  *RADIOLOGY REPORT*  Clinical Data: Hypertension.  Heart murmur.  Dysrhythmia.  Sleep apnea.  CHEST - 2 VIEW  Comparison: None.  Findings:  Cardiopericardial silhouette within normal limits. Mediastinal contours normal. Trachea midline.  No airspace disease or effusion.  IMPRESSION: No active cardiopulmonary disease.  Original Report Authenticated By: Andreas Newport, M.D.    DISCHARGE INSTRUCTIONS: Discharge Orders    Future Orders Please Complete By Expires   Diet - low sodium heart healthy      Call MD / Call 911      Comments:   If you experience chest pain or shortness of breath, CALL 911 and be transported to the hospital emergency room.  If you develope a fever above 101 F, pus (white drainage) or increased drainage or redness at the wound, or calf  pain, call your surgeon's office.   Constipation Prevention      Comments:   Drink plenty of fluids.  Prune juice may be helpful.  You may use a stool softener, such as Colace (over the counter) 100 mg twice a day.  Use MiraLax (over the counter) for constipation as needed.   Increase activity slowly as tolerated      Weight Bearing as taught in Physical Therapy      Comments:   Use a walker or crutches as instructed.   Driving restrictions      Comments:   No driving for 6 weeks   Lifting restrictions      Comments:   No lifting for 6 weeks   CPM      Comments:   Continuous passive motion machine (CPM):      Use the CPM from 0 to 90 for 6-8 hours per day.      You may increase by 10 per day.  You may break it up into 2 or 3 sessions per day.      Use CPM for 2 weeks or until you are told to stop.   TED hose      Comments:   Use stockings (TED hose) for 3 weeks on both leg(s).  You may remove them at night for sleeping.   Change dressing      Comments:   Change dressing on friday, then change the dressing daily with sterile 4 x 4 inch gauze dressing and apply TED hose.  You may clean the incision with alcohol prior to redressing.   Do not put a pillow under the knee. Place it under the heel.         DISCHARGE MEDICATIONS:   Medication List  As of 12/26/2011 12:24 PM   STOP taking these medications         HYDROcodone-acetaminophen 10-325 MG per tablet         TAKE these medications         amLODipine 5 MG tablet   Commonly known as: NORVASC   Take 5 mg by mouth daily.      budesonide 3 MG 24 hr capsule   Commonly known as: ENTOCORT EC   Take 6 mg by mouth every morning.      enoxaparin 40  MG/0.4ML injection   Commonly known as: LOVENOX   Inject 0.4 mLs (40 mg total) into the skin daily.      FLUoxetine 40 MG capsule   Commonly known as: PROZAC   Take 40 mg by mouth daily.      ibuprofen 600 MG tablet   Commonly known as: ADVIL,MOTRIN   Take 600 mg by mouth 2  (two) times daily.      LORazepam 1 MG tablet   Commonly known as: ATIVAN   Take 1 mg by mouth daily as needed. For migraines      Melatonin 5 MG Tabs   Take 1 tablet by mouth at bedtime. For sleep      methocarbamol 500 MG tablet   Commonly known as: ROBAXIN   Take 1-2 tablets (500-1,000 mg total) by mouth every 6 (six) hours as needed.      metoprolol succinate 50 MG 24 hr tablet   Commonly known as: TOPROL-XL   Take 50 mg by mouth daily. Take with or immediately following a meal.      omeprazole 20 MG capsule   Commonly known as: PRILOSEC   Take 20 mg by mouth daily.      orphenadrine 100 MG tablet   Commonly known as: NORFLEX   Take 100 mg by mouth daily as needed. takes with lorazepam for migraines      oxyCODONE 5 MG immediate release tablet   Commonly known as: Oxy IR/ROXICODONE   Take 1-2 tablets (5-10 mg total) by mouth every 3 (three) hours as needed.      simvastatin 20 MG tablet   Commonly known as: ZOCOR   Take 20 mg by mouth every evening.            FOLLOW UP VISIT:   Follow-up Information    Follow up with Raymon Mutton, MD. Call on 01/06/2012.   Contact information:   [address] [place] [address] [phone]          DISPOSITION:  Home    CONDITION:  Good   Francella Barnett 12/26/2011, 12:24 PM

## 2011-12-26 NOTE — Progress Notes (Signed)
Physical Therapy Treatment Note   12/26/11 0755  PT Visit Information  Last PT Received On 12/26/11  Precautions  Precautions Knee  Restrictions  LLE Weight Bearing WBAT  Bed Mobility  Supine to Sit 5: Supervision  Supine to Sit Details (indicate cue type and reason) no use of bed rail, increased time  Sitting - Scoot to [place] of Bed 5: Supervision  Transfers  Sit to Stand 5: Supervision  Sit to Stand Details (indicate cue type and reason) verbal cues for hand placement  Ambulation/Gait  Ambulation/Gait Assistance 4: Min assist  Ambulation/Gait Assistance Details (indicate cue type and reason) contact guard, patient transitioned to step-through gait pattern towards end of tx  Ambulation Distance (Feet) 150 Feet  Assistive device Rolling walker  Gait Pattern Step-to pattern;Decreased step length - left;Decreased stance time - left  Gait velocity improved from yesterday  Stairs (did not completed but went over and re-educated pt on stair negotiation, pt able to recall ascending but required min v/cs for descending)  Total Joint Exercises  Heel Slides AROM;Left;20 reps;Seated;PROM (pt achieved approx 95 deg L knee flex in sitting)  [employer] reps;Seated  PT - End of Session  Equipment Utilized During Treatment Gait belt  Activity Tolerance Patient tolerated treatment well  Patient left in chair;with call bell in reach  General  Behavior During Session [facility] for tasks performed  Cognition [facility] for tasks performed  PT - Assessment/Plan  Comments on Treatment Session Patient with improved recall of yesterdays treatment requiring only minor cues for descending step sequencing and hand placement when getting up from bed. Patient safe to return home with 24/7 supervision and HHPT, If 24/7 supervision can not be provided pt unsafe to return home.  PT Plan Discharge plan remains appropriate  Follow Up Recommendations Home health PT;Supervision/Assistance - 24 hour (vs SNF if 24/7  assist isn't avail)  Equipment Recommended None recommended by PT  Acute Rehab PT Goals  PT Goal: Supine/Side to Sit - Progress Progressing toward goal  PT Goal: Sit to Stand - Progress Progressing toward goal  PT Transfer Goal: Bed to Chair/Chair to Bed - Progress Progressing toward goal  PT Goal: Ambulate - Progress Progressing toward goal  PT Goal: Perform Home Exercise Program - Progress Progressing toward goal    Pain: 4/10 L knee pain  Lewis Shock, PT, DPT Pager #: [phone] Office #: [phone]

## 2011-12-26 NOTE — Discharge Instructions (Signed)
Diet: As you were doing prior to hospitalization   Activity:  Increase activity slowly as tolerated                  No lifting or driving for 6 weeks  Shower:  May shower without a dressing once there is no drainage from your wound.                 Do NOT wash over the wound.  If drainage remains, cover wound with saran                  Wrap and then shower.  Clean incision with betadine and change dressing                        After saran wrap removed.  Dressing:  You may change your dressing on Friday                    Then change the dressing daily with sterile 4"x4"s gauze dressing                     And TED hose for knees.  Use paper tape to hold dressing in place                     For hips.  You may clean the incision with alcohol prior to redressing.  Weight Bearing:  Weight bearing as tolerated as taught in physical therapy.  Use a                                walker or Crutches as instructed.  To prevent constipation: you may use a stool softener such as -               Colace ( over the counter) 100 mg by mouth twice a day                Drink plenty of fluids ( prune juice may be helpful) and high fiber foods                Miralax ( over the counter) for constipation as needed.    Precautions:  If you experience chest pain or shortness of breath - call 911 immediately               For transfer to the hospital emergency department!!               If you develop a fever greater that 101 F, purulent drainage from wound,                             increased redness or drainage from wound, or calf pain -- Call the office at                                                 [phone].  Follow- Up Appointment:  Please call for an appointment to be seen on 01/06/12  [place] - [phone]                   

## 2011-12-26 NOTE — Progress Notes (Signed)
  Georgena Spurling, MD   Altamese Cabal, PA-C [address] [place], [place], [place]  [address]                             [phone]   PROGRESS NOTE  Subjective:  negative for Chest Pain  negative for Shortness of Breath  negative for Nausea/Vomiting   negative for Calf Pain  negative for Bowel Movement   Tolerating Diet: yes         Patient reports pain as 4 on 0-10 scale.    Objective: Vital signs in last 24 hours:   Patient Vitals for the past 24 hrs:  BP Temp Pulse Resp SpO2  12/26/11 0558 118/58 mmHg 97.7 F (36.5 C) 70  18  94 %  12/25/11 2342 121/56 mmHg 97.3 F (36.3 C) 74  18  90 %  12/25/11 1254 125/46 mmHg 97.2 F (36.2 C) 75  18  -    @flow {1959:LAST@   Intake/Output from previous day:   03/21 0701 - 03/22 0700 In: 960 [P.O.:960] Out: 400 [Urine:400]   Intake/Output this shift:       Intake/Output      03/21 0701 - 03/22 0700 03/22 0701 - 03/23 0700   P.O. 960    Total Intake 960    Urine 400    Total Output 400    Net +560         Urine Occurrence 1 x       LABORATORY DATA:  Basename 12/25/11 0731 12/24/11 0530 12/23/11 0526  WBC 6.2 7.9 9.1  HGB 9.8* 9.6* 11.8*  HCT 29.1* 29.0* 35.3*  PLT 175 126* 178    Basename 12/26/11 0625 12/25/11 0731 12/24/11 0530 12/23/11 0526  NA 135 134* 131* 132*  K 3.5 4.0 4.0 4.1  CL 101 100 99 99  CO2 26 28 25 22   BUN 9 10 8 8   CREATININE 0.66 0.66 0.67 0.54  GLUCOSE 108* 142* 112* 123*  CALCIUM 9.2 9.0 8.8 9.0   Lab Results  Component Value Date   INR 0.96 12/11/2011    Examination:  General appearance: alert, cooperative and no distress Extremities: Homans sign is negative, no sign of DVT  Wound Exam: clean, dry, intact   Drainage:  None: wound tissue dry  Motor Exam: EHL and FHL Intact  Sensory Exam: Deep Peroneal normal  Vascular Exam:    Assessment:    4 Days Post-Op  Procedure(s) (LRB): TOTAL KNEE ARTHROPLASTY (Left)  ADDITIONAL DIAGNOSIS:  Active Problems:  * No active  hospital problems. *   Acute Blood Loss Anemia   Plan: Physical Therapy as ordered Weight Bearing as Tolerated (WBAT)  DVT Prophylaxis:  Lovenox  DISCHARGE PLAN: Home  DISCHARGE NEEDS: HHPT, CPM, Walker and 3-in-1 comode seat         Lashauna Arpin 12/26/2011, 12:18 PM

## 2012-01-12 ENCOUNTER — Encounter: Payer: Self-pay | Admitting: Orthopedic Surgery

## 2012-02-04 ENCOUNTER — Encounter: Payer: Self-pay | Admitting: Orthopedic Surgery

## 2012-09-06 ENCOUNTER — Ambulatory Visit: Payer: Self-pay | Admitting: Ophthalmology

## 2012-09-13 ENCOUNTER — Ambulatory Visit: Payer: Self-pay | Admitting: Ophthalmology

## 2012-10-19 ENCOUNTER — Encounter: Payer: Self-pay | Admitting: Orthopedic Surgery

## 2012-11-06 ENCOUNTER — Encounter: Payer: Self-pay | Admitting: Orthopedic Surgery

## 2012-11-08 ENCOUNTER — Ambulatory Visit: Payer: Self-pay | Admitting: Family Medicine

## 2012-12-04 ENCOUNTER — Encounter: Payer: Self-pay | Admitting: Orthopedic Surgery

## 2012-12-31 ENCOUNTER — Other Ambulatory Visit: Payer: Self-pay | Admitting: Family Medicine

## 2012-12-31 LAB — BASIC METABOLIC PANEL
Anion Gap: 8 (ref 7–16)
BUN: 10 mg/dL (ref 7–18)
Calcium, Total: 9.1 mg/dL (ref 8.5–10.1)
Chloride: 105 mmol/L (ref 98–107)
Co2: 24 mmol/L (ref 21–32)
Creatinine: 0.82 mg/dL (ref 0.60–1.30)
Glucose: 117 mg/dL — ABNORMAL HIGH (ref 65–99)
Osmolality: 274 (ref 275–301)
Potassium: 3.5 mmol/L (ref 3.5–5.1)
Sodium: 137 mmol/L (ref 136–145)

## 2013-01-07 ENCOUNTER — Emergency Department: Payer: Self-pay | Admitting: Emergency Medicine

## 2013-01-07 LAB — CBC WITH DIFFERENTIAL/PLATELET
Basophil #: 0.1 10*3/uL (ref 0.0–0.1)
Basophil %: 0.7 %
Eosinophil %: 1.5 %
HGB: 12.2 g/dL (ref 12.0–16.0)
Lymphocyte %: 20.7 %
MCH: 27.8 pg (ref 26.0–34.0)
MCHC: 32.8 g/dL (ref 32.0–36.0)
MCV: 85 fL (ref 80–100)
Neutrophil #: 6.4 10*3/uL (ref 1.4–6.5)
Neutrophil %: 69.8 %
Platelet: 269 10*3/uL (ref 150–440)
RDW: 16.3 % — ABNORMAL HIGH (ref 11.5–14.5)

## 2013-01-07 LAB — TROPONIN I: Troponin-I: <0.02 ng/mL

## 2013-01-07 LAB — COMPREHENSIVE METABOLIC PANEL
Alkaline Phosphatase: 109 U/L (ref 50–136)
Anion Gap: 8 (ref 7–16)
Calcium, Total: 9.2 mg/dL (ref 8.5–10.1)
Chloride: 106 mmol/L (ref 98–107)
Co2: 25 mmol/L (ref 21–32)
Creatinine: 0.85 mg/dL (ref 0.60–1.30)
Glucose: 128 mg/dL — ABNORMAL HIGH (ref 65–99)
Osmolality: 281 (ref 275–301)
Potassium: 3.3 mmol/L — ABNORMAL LOW (ref 3.5–5.1)
SGOT(AST): 25 U/L (ref 15–37)
SGPT (ALT): 24 U/L (ref 12–78)
Sodium: 139 mmol/L (ref 136–145)

## 2013-03-09 ENCOUNTER — Ambulatory Visit: Payer: Self-pay | Admitting: Specialist

## 2013-03-18 ENCOUNTER — Emergency Department: Payer: Self-pay | Admitting: Emergency Medicine

## 2013-04-05 ENCOUNTER — Ambulatory Visit: Payer: Self-pay | Admitting: Orthopedic Surgery

## 2013-09-14 ENCOUNTER — Ambulatory Visit: Payer: Self-pay | Admitting: Ophthalmology

## 2013-11-09 ENCOUNTER — Ambulatory Visit: Payer: Self-pay | Admitting: Family Medicine

## 2014-02-24 ENCOUNTER — Ambulatory Visit (INDEPENDENT_AMBULATORY_CARE_PROVIDER_SITE_OTHER): Payer: Medicare HMO | Admitting: Internal Medicine

## 2014-02-24 ENCOUNTER — Other Ambulatory Visit: Payer: Medicare HMO

## 2014-02-24 ENCOUNTER — Encounter (INDEPENDENT_AMBULATORY_CARE_PROVIDER_SITE_OTHER): Payer: Self-pay

## 2014-02-24 ENCOUNTER — Encounter: Payer: Self-pay | Admitting: Internal Medicine

## 2014-02-24 VITALS — BP 122/86 | HR 74 | Ht 69.0 in | Wt 226.6 lb

## 2014-02-24 DIAGNOSIS — R059 Cough, unspecified: Secondary | ICD-10-CM

## 2014-02-24 DIAGNOSIS — R05 Cough: Secondary | ICD-10-CM

## 2014-02-24 DIAGNOSIS — R053 Chronic cough: Secondary | ICD-10-CM | POA: Insufficient documentation

## 2014-02-24 DIAGNOSIS — Z77120 Contact with and (suspected) exposure to mold (toxic): Secondary | ICD-10-CM

## 2014-02-24 MED ORDER — HYDROCOD POLST-CHLORPHEN POLST 10-8 MG/5ML PO LQCR: 5.0000 mL | Freq: Two times a day (BID) | ORAL | Status: DC

## 2014-02-24 NOTE — Patient Instructions (Signed)
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  Cough is from sinus drainage, possible acid reflux, possible asthma or Hypersensitivity Pneumonitis All of this is working together to cause cyclical cough/LPR cough and made worse by talking  #Sinus drainage /Sinus allergies  - start netti pot saline wash three times a week - continue singulair and nasonex as before    #Possible Acid Reflux  - take otc zegerid 20mg   1 capsule daily on empty stomach    -  At all times avoid colas, spices, cheeses, spirits, red meats, beer, chocolates, fried foods etc.,   - sleep with head end of bed elevated  - eat small frequent meals  - do not go to bed for 3 hours after last meal  #Possible Asthma  - do PFT breathing test in 4 weeks  #Possible Hypersensitivity Pneumonitis  - do blood test for mold  - do HRCT chest    #Cyclical cough/Irritable Larynx  - please choose 2-3 days and observe complete voice rest - no talking or whispering  - at all times there  there is urge to cough, drink water or swallow or sip on throat lozenge - do tussionex 26ml Twice daily x 3 days during this period  #Followup - I will see you in 4 weeksl cough score at followup - any problems call or come sooner

## 2014-02-24 NOTE — Progress Notes (Addendum)
Subjective:    Patient ID: Caitlin Leonard, female    DOB: [birth year], 68 y.o.   MRN: [id number]  PCP [place] Pink, MD Allergist : DR Madilyn Hook Pulmonary : Dr Raul Del -> Dr Chase Caller Rheum: Dr Amil Amen  HPI  IOV 02/24/2014  Chief Complaint  Patient presents with  . Pulmonary Consult    Referred by Dr. Donneta Romberg for asthmatic bronchitis.     New consult for chronic cough  68 year old  female previously well. A year ago in April 2014 approximately she started doing some gardening by removing  Weeds  one day and then abruptly she developed cough possibly with fever and green sputum production. Apparently chest x-ray at that time showed pneumonia. She was treated with antibiotics. And possibly prednisone at that time. Cough subsequently resolved in 10 weeks. She did have allergy workup in June 2014 that showed significant positive reaction for mixed Aspergillus and mold mix. To my knowledge she was not treated with allergy shots. She was maintained on steroid inhalers and after 10 weeks she started feeling well although she did have some mild basal chronic cough.  Then again on 01/04/2014 she went to the garden and removed chick weeds and then abruptly fell sick with fever, green sputum. This time apparently the chest x-ray was normal. She saw Dr. Raul Del in [place]. Dr. Raul Del is a pulmonologist. Apparently was diagnosed with asthmatic bronchitis. Unclear she had a spirometry. She was maintained on prednisone but within 4 weeks again tremendous weight so she quit taking this several weeks ago. She now rates that a chest x-ray was clear. Initially cough was productive but subsequently has become dry. Initially cough was severe but currently rated as moderate. Cough has definitely improved with the passage of time but still significant enough. Initially running low-grade fever for several weeks but subsequently resolved. Cough is associated with fatigue, ticklish sensation in the throat and  occasional gag. She denies any acid reflux. Cough somewhat improved by cough suppressants. Significantly impact quality of life.  Of note, she is known to have psoriatic arthritis and apparently was considered for TNF alpha agents but because of the above they were not started. She is on [place] for this. Uptodate says: Phosphodiesterase-4 Enzyme Inhibitor. Respiratory: Upper respiratory tract infection (8%), nasopharyngitis (7%), sinusitis (2%), bronchitis (<2%), cough (<2%), pharyngitis (<2%), rhinitis (<2%), sinus headache (<2%).  Infection: Use caution in patients with latent infections such as tuberculosis, viral hepatitis, herpes viral infection and herpes zoster (limited experience). Use in patients with severe immunological diseases, severe acute infectious diseases, or psoriasis patients treated with immunosuppressive therapy has not been evaluated.  Current RSI cough score is 29 and reflects multifactorial cough    Dr Lorenza Cambridge Reflux Symptom Index (> 13-15 suggestive of LPR cough) 0 -> 5  =  none ->severe problem 02/24/2014   Hoarseness of problem with voice 5  Clearing  Of Throat 5  Excess throat mucus or feeling of post nasal drip 5  Difficulty swallowing food, liquid or tablets 1  Cough after eating or lying down 4  Breathing difficulties or choking episodes 2  Troublesome or annoying cough 5  Sensation of something sticking in throat or lump in throat 1  Heartburn, chest pain, indigestion, or stomach acid coming up 1  TOTAL 29    Past Medical History  Diagnosis Date  . Hypertension     on medication x 10 years  . Heart murmur     MVP ( symptomatic)  . Dysrhythmia   .  Asthma     due to seasonal alllergies  . Sleep apnea 2007    does not use CPAP since 40 # wt loss  . GERD (gastroesophageal reflux disease)   . History of IBS   . Collagenous colitis 2010  . Headache(784.0)     migraines; 2 x month  . Arthritis     osteoarthritis  . Anxiety   . Depression      situational  . Hyperlipemia   . Syncopal episodes   . Cataract   . Shortness of breath     exertional     Family History  Problem Relation Age of Onset  . Anesthesia problems Neg Hx      History   Social History  . Marital Status: Married    Spouse Name: N/A    Number of Children: N/A  . Years of Education: N/A   Occupational History  . retired Pharmacist, hospital    Social History Main Topics  . Smoking status: Never Smoker   . Smokeless tobacco: Never Used  . Alcohol Use: Yes     Comment: occasional wine  . Drug Use: No  . Sexual Activity: No   Other Topics Concern  . Not on file   Social History Narrative  . No narrative on file     Allergies  Allergen Reactions  . Contrast Media [Iodinated Diagnostic Agents] Anaphylaxis  . Hydroxychloroquine Sulfate Hives  . Metronidazole Other (See Comments)    unknown  . Propranolol Hcl Hives  . Sulfonamide Derivatives Hives     Outpatient Prescriptions Prior to Visit  Medication Sig Dispense Refill  . amLODipine (NORVASC) 5 MG tablet Take 5 mg by mouth daily.      Marland Kitchen ibuprofen (ADVIL,MOTRIN) 600 MG tablet Take 600 mg by mouth 2 (two) times daily.      Marland Kitchen LORazepam (ATIVAN) 1 MG tablet Take 1 mg by mouth daily as needed. For migraines      . Melatonin 5 MG TABS Take 1 tablet by mouth at bedtime. For sleep      . metoprolol succinate (TOPROL-XL) 50 MG 24 hr tablet Take 50 mg by mouth daily. Take with or immediately following a meal.      . omeprazole (PRILOSEC) 20 MG capsule Take 20 mg by mouth daily.      . orphenadrine (NORFLEX) 100 MG tablet Take 100 mg by mouth daily as needed. takes with lorazepam for migraines      . simvastatin (ZOCOR) 20 MG tablet Take 20 mg by mouth every evening.      . budesonide (ENTOCORT EC) 3 MG 24 hr capsule Take 6 mg by mouth every morning.      . enoxaparin (LOVENOX) 40 MG/0.4ML injection Inject 0.4 mLs (40 mg total) into the skin daily.  10 Syringe  0  . FLUoxetine (PROZAC) 40 MG capsule Take 40  mg by mouth daily.       No facility-administered medications prior to visit.      Review of Systems  Constitutional: Negative for fever and unexpected weight change.  HENT: Positive for congestion, rhinorrhea and sinus pressure. Negative for dental problem, ear pain, nosebleeds, postnasal drip, sneezing, sore throat and trouble swallowing.   Eyes: Negative for redness and itching.  Respiratory: Positive for cough, chest tightness and shortness of breath. Negative for wheezing.   Cardiovascular: Negative for palpitations and leg swelling.  Gastrointestinal: Negative for nausea and vomiting.  Genitourinary: Negative for dysuria.  Musculoskeletal: Negative for joint swelling.  Skin: Negative for rash.  Neurological: Positive for headaches.  Hematological: Does not bruise/bleed easily.  Psychiatric/Behavioral: Negative for dysphoric mood. The patient is not nervous/anxious.        Objective:   Physical Exam  Vitals reviewed. Constitutional: She is oriented to person, place, and time. She appears well-developed and well-nourished. No distress.  Body mass index is 33.45 kg/(m^2).   HENT:  Head: Normocephalic and atraumatic.  Right Ear: External ear normal.  Left Ear: External ear normal.  Mouth/Throat: Oropharynx is clear and moist. No oropharyngeal exudate.  Post nasal drainage +  Eyes: Conjunctivae and EOM are normal. Pupils are equal, round, and reactive to light. Right eye exhibits no discharge. Left eye exhibits no discharge. No scleral icterus.  Neck: Normal range of motion. Neck supple. No JVD present. No tracheal deviation present. No thyromegaly present.  Cardiovascular: Normal rate, regular rhythm, normal heart sounds and intact distal pulses.  Exam reveals no gallop and no friction rub.   No murmur heard. Pulmonary/Chest: Effort normal and breath sounds normal. No respiratory distress. She has no wheezes. She has no rales. She exhibits no tenderness.  Abdominal: Soft.  Bowel sounds are normal. She exhibits no distension and no mass. There is no tenderness. There is no rebound and no guarding.  Musculoskeletal: Normal range of motion. She exhibits no edema and no tenderness.  Lymphadenopathy:    She has no cervical adenopathy.  Neurological: She is alert and oriented to person, place, and time. She has normal reflexes. No cranial nerve deficit. She exhibits normal muscle tone. Coordination normal.  Skin: Skin is warm and dry. No rash noted. She is not diaphoretic. No erythema. No pallor.  Psychiatric: She has a normal mood and affect. Her behavior is normal. Judgment and thought content normal.          Assessment & Plan:

## 2014-02-25 NOTE — Assessment & Plan Note (Signed)
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  Cough is from sinus drainage, possible acid reflux, possible asthma or Hypersensitivity Pneumonitis All of this is working together to cause cyclical cough/LPR cough and made worse by talking  #Sinus drainage /Sinus allergies  - start netti pot saline wash three times a week - continue singulair and nasonex as before    #Possible Acid Reflux  - take otc zegerid 20mg  1 capsule daily on empty stomach    -  At all times avoid colas, spices, cheeses, spirits, red meats, beer, chocolates, fried foods etc.,   - sleep with head end of bed elevated  - eat small frequent meals  - do not go to bed for 3 hours after last meal  #Possible Asthma  - do PFT breathing test in 4 weeks  #Possible Hypersensitivity Pneumonitis  - do blood test for mold  - do HRCT chest    #Cyclical cough/Irritable Larynx  - please choose 2-3 days and observe complete voice rest - no talking or whispering  - at all times there  there is urge to cough, drink water or swallow or sip on throat lozenge - do tussionex 5ml Twice daily x 3 days during this period  #Followup - I will see you in 4 weeksl cough score at followup - any problems call or come sooner  

## 2014-02-25 NOTE — Assessment & Plan Note (Signed)
Check CT chest Check HP panel

## 2014-03-02 ENCOUNTER — Encounter: Payer: Self-pay | Admitting: Internal Medicine

## 2014-03-02 LAB — HYPERSENSITIVITY PNUEMONITIS PROFILE

## 2014-03-03 ENCOUNTER — Ambulatory Visit (INDEPENDENT_AMBULATORY_CARE_PROVIDER_SITE_OTHER)
Admission: RE | Admit: 2014-03-03 | Discharge: 2014-03-03 | Disposition: A | Discharge status: Home / Self Care | Payer: Medicare HMO | Source: Ambulatory Visit | Attending: Internal Medicine | Admitting: Internal Medicine

## 2014-03-03 DIAGNOSIS — R059 Cough, unspecified: Secondary | ICD-10-CM

## 2014-03-03 DIAGNOSIS — R053 Chronic cough: Secondary | ICD-10-CM

## 2014-03-03 DIAGNOSIS — R05 Cough: Secondary | ICD-10-CM

## 2014-03-07 ENCOUNTER — Telehealth: Payer: Self-pay | Admitting: Internal Medicine

## 2014-03-07 ENCOUNTER — Encounter: Payer: Self-pay | Admitting: Internal Medicine

## 2014-03-07 NOTE — Telephone Encounter (Addendum)
Patient sent email asking for results. I have replied back. However, if she calls here they are for your use    02/24/14 - HP panel negative; this is for mold exposure. Did not call because I wanted to wait for CT chest  03/03/14 - CT chest done Friday and reviweing Tuesday 03/07/14: Just let her know healthy lung tissue but there are few other minor findings that I will explain face to face    PLAN  - continue cough Rx plan laid out at office visit  - minor CT chest findings: will explain at followup  - ensure followup per prior OV    THanks  Dr. Brand Males, M.D., [facility].C.P Pulmonary and Critical Care Medicine Staff Physician [facility] Pager: [phone], If no answer or between  15:00h - 7:00h: call [phone]  03/07/2014 10:09 PM   CT chst 03/03/14   1)  No evidence of ILD to explain cough. Good news 2. There is evidence she had viral respiratory infection in past   3) Coronary artery calcification 4) Ascending aorta 4cm    CT chest 03/03/14  COMPARISON: None.  FINDINGS:  No pathologically enlarged mediastinal lymph nodes. Hilar regions  are difficult to definitively evaluate without IV contrast but  appear grossly unremarkable. No axillary adenopathy. Ascending aorta  measures 4 cm. Three-vessel coronary artery calcification. Heart  size normal. No pleural fluid.  Clustered peribronchovascular nodularity in the right lower lobe,  superior segment (series 5, image 23) is likely post infectious in  etiology. No ill-defined centrilobular nodularity. No subpleural  reticulation, traction bronchiectasis/ bronchiolectasis,  ground-glass, architectural distortion or honeycombing. No air  trapping on inspiratory and expiratory imaging. No pleural fluid.  Airway is unremarkable.  Incidental imaging of the upper abdomen shows the visualized  portions of the liver, adrenal glands, kidneys, spleen and stomach  to be grossly  unremarkable. No upper abdominal adenopathy. No  worrisome lytic or sclerotic lesions. Degenerative changes are seen  in the spine.  IMPRESSION:  1. No evidence of interstitial lung disease or hypersensitivity  pneumonitis. No findings to explain the patient's chronic cough.  2. Small ascending aortic aneurysm.  3. Three-vessel coronary artery calcification.  Electronically Signed  By: Lorin Picket M.D.  On: 03/03/2014 11:23             External Result Report

## 2014-03-07 NOTE — Telephone Encounter (Signed)
Pt is requesting lab results. Please advise MR thanks

## 2014-03-08 NOTE — Telephone Encounter (Signed)
I called and spoke with the pt about her results and she verbalized understanding  She verbalized understanding and nothing further needed Will keep her planned ov in July

## 2014-03-17 ENCOUNTER — Encounter: Payer: Self-pay | Admitting: Internal Medicine

## 2014-04-17 ENCOUNTER — Ambulatory Visit (INDEPENDENT_AMBULATORY_CARE_PROVIDER_SITE_OTHER): Payer: Medicare HMO | Admitting: Internal Medicine

## 2014-04-17 ENCOUNTER — Encounter: Payer: Self-pay | Admitting: Internal Medicine

## 2014-04-17 VITALS — BP 132/80 | HR 120 | Ht 67.0 in | Wt 230.0 lb

## 2014-04-17 DIAGNOSIS — R059 Cough, unspecified: Secondary | ICD-10-CM

## 2014-04-17 DIAGNOSIS — I2584 Coronary atherosclerosis due to calcified coronary lesion: Secondary | ICD-10-CM

## 2014-04-17 DIAGNOSIS — R05 Cough: Secondary | ICD-10-CM

## 2014-04-17 DIAGNOSIS — Z77120 Contact with and (suspected) exposure to mold (toxic): Secondary | ICD-10-CM

## 2014-04-17 DIAGNOSIS — I2510 Atherosclerotic heart disease of native coronary artery without angina pectoris: Secondary | ICD-10-CM

## 2014-04-17 DIAGNOSIS — R053 Chronic cough: Secondary | ICD-10-CM

## 2014-04-17 DIAGNOSIS — I712 Thoracic aortic aneurysm, without rupture, unspecified: Secondary | ICD-10-CM

## 2014-04-17 DIAGNOSIS — I7121 Aneurysm of the ascending aorta, without rupture: Secondary | ICD-10-CM | POA: Insufficient documentation

## 2014-04-17 LAB — PULMONARY FUNCTION TEST
DL/VA % PRED: 80 %
DL/VA: 4.16 ml/min/mmHg/L
DLCO unc % pred: 71 %
DLCO unc: 20.6 ml/min/mmHg
FEF 25-75 Post: 3.64 L/sec
FEF 25-75 Pre: 2.8 L/sec
FEF2575-%CHANGE-POST: 30 %
FEF2575-%Pred-Post: 160 %
FEF2575-%Pred-Pre: 123 %
FEV1-%CHANGE-POST: 8 %
FEV1-%PRED-POST: 105 %
FEV1-%PRED-PRE: 96 %
FEV1-POST: 2.84 L
FEV1-PRE: 2.61 L
FEV1FVC-%CHANGE-POST: 0 %
FEV1FVC-%Pred-Pre: 106 %
FEV6-%Change-Post: 8 %
FEV6-%Pred-Post: 102 %
FEV6-%Pred-Pre: 94 %
FEV6-Post: 3.47 L
FEV6-Pre: 3.19 L
FEV6FVC-%PRED-PRE: 104 %
FEV6FVC-%Pred-Post: 104 %
FVC-%Change-Post: 8 %
FVC-%PRED-PRE: 90 %
FVC-%Pred-Post: 98 %
FVC-Post: 3.47 L
FVC-Pre: 3.19 L
POST FEV1/FVC RATIO: 82 %
PRE FEV6/FVC RATIO: 100 %
Post FEV6/FVC ratio: 100 %
Pre FEV1/FVC ratio: 82 %
RV % PRED: 75 %
RV: 1.72 L
TLC % pred: 99 %
TLC: 5.54 L

## 2014-04-17 NOTE — Progress Notes (Signed)
PFT done today. 

## 2014-04-17 NOTE — Assessment & Plan Note (Signed)
  Chronic Cough   - too bad is back aftger gardening exposure  - Cough is from sinus drainage, possible acid reflux, possible asthma  All of this is working together to cause cyclical cough/LPR cough or irritable larynx and made worse by talking  #Sinus drainage /Sinus allergies  -continue netti pot saline wash three times a week - continue singulair and nasonex as before  #Possible Acid Reflux  - continue PPI as before on empty stomach    -  At all times avoid colas, spices, cheeses, spirits, red meats, beer, chocolates, fried foods etc.,   - sleep with head end of bed elevated  - eat small frequent meals  - do not go to bed for 3 hours after last meal  #Possible Asthma  - continue advair as before   #Cyclical cough/Irritable Larynx  - you will benefit from nuerontin and speech therapy but have agreed you want to see how next 8 weeks evolve before commmiting to this or referral to [facility] Dr Joya Gaskins  #Health Maintenance  - PREVNAR Vaccine today   -#Followup - I will see you in 8 weeksl cough score at followup - any problems call or come sooner

## 2014-04-17 NOTE — Assessment & Plan Note (Signed)
She recollects having had a cardiac stress test 5 years ago. She is surprised to find out that she has coronary artery calcification on her CT scan of the chest and then when she wants a copy of the CD-ROM. She will discuss this with her cardiologist in [place] Dr. Bartholome Bill

## 2014-04-17 NOTE — Patient Instructions (Addendum)
#  Coronary ARtery calcification and Aortica Aneurysm  - sign release to get CD rom of our CT chest and please follow with Dr Bartholome Bill your cardiologist in [place]  # Chronic Cough   - too bad is back  - Cough is from sinus drainage, possible acid reflux, possible asthma  All of this is working together to cause cyclical cough/LPR cough or irritable larynx and made worse by talking  #Sinus drainage /Sinus allergies  -continue netti pot saline wash three times a week - continue singulair and nasonex as before  #Possible Acid Reflux  - continue PPI as before on empty stomach    -  At all times avoid colas, spices, cheeses, spirits, red meats, beer, chocolates, fried foods etc.,   - sleep with head end of bed elevated  - eat small frequent meals  - do not go to bed for 3 hours after last meal  #Possible Asthma  - continue advair as before   #Cyclical cough/Irritable Larynx  - you will benefit from nuerontin and speech therapy but have agreed you want to see how next 8 weeks evolve  #Health Maintenance  - PREVNAR Vaccine today   -#Followup - I will see you in 8 weeksl cough score at followup - any problems call or come sooner

## 2014-04-17 NOTE — Progress Notes (Signed)
Subjective:    Patient ID: Caitlin Leonard, female    DOB: 06-08-1946, 68 y.o.   MRN: [id number]  HPI  IOV 02/24/14    New consult for chronic cough  68 year old  female previously well. A year ago in April 2014 approximately she started doing some gardening by removing  Weeds  one day and then abruptly she developed cough possibly with fever and green sputum production. Apparently chest x-ray at that time showed pneumonia. She was treated with antibiotics. And possibly prednisone at that time. Cough subsequently resolved in 10 weeks. She did have allergy workup in June 2014 that showed significant positive reaction for mixed Aspergillus and mold mix. To my knowledge she was not treated with allergy shots. She was maintained on steroid inhalers and after 10 weeks she started feeling well although she did have some mild basal chronic cough.  Then again on 01/04/2014 she went to the garden and removed chick weeds and then abruptly fell sick with fever, green sputum. This time apparently the chest x-ray was normal. She saw Dr. Raul Del in [place]. Dr. Raul Del is a pulmonologist. Apparently was diagnosed with asthmatic bronchitis. Unclear she had a spirometry. She was maintained on prednisone but within 4 weeks again tremendous weight so she quit taking this several weeks ago. She now rates that a chest x-ray was clear. Initially cough was productive but subsequently has become dry. Initially cough was severe but currently rated as moderate. Cough has definitely improved with the passage of time but still significant enough. Initially running low-grade fever for several weeks but subsequently resolved. Cough is associated with fatigue, ticklish sensation in the throat and occasional gag. She denies any acid reflux. Cough somewhat improved by cough suppressants. Significantly impact quality of life.  Of note, she is known to have psoriatic arthritis and apparently was considered for TNF alpha agents but  because of the above they were not started. She is on [place] for this. Uptodate says: Phosphodiesterase-4 Enzyme Inhibitor. Respiratory: Upper respiratory tract infection (8%), nasopharyngitis (7%), sinusitis (2%), bronchitis (<2%), cough (<2%), pharyngitis (<2%), rhinitis (<2%), sinus headache (<2%).  Infection: Use caution in patients with latent infections such as tuberculosis, viral hepatitis, herpes viral infection and herpes zoster (limited experience). Use in patients with severe immunological diseases, severe acute infectious diseases, or psoriasis patients treated with immunosuppressive therapy has not been evaluated.  Current RSI cough score is 29 and reflects multifactorial cough    Dr Lorenza [place] Reflux Symptom Index (> 13-15 suggestive of LPR cough) 0 -> 5  =  none ->severe problem 02/24/2014   Hoarseness of problem with voice 5  Clearing  Of Throat 5  Excess throat mucus or feeling of post nasal drip 5  Difficulty swallowing food, liquid or tablets 1  Cough after eating or lying down 4  Breathing difficulties or choking episodes 2  Troublesome or annoying cough 5  Sensation of something sticking in throat or lump in throat 1  Heartburn, chest pain, indigestion, or stomach acid coming up 1  TOTAL 29      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   Cough is from sinus drainage, possible acid reflux, possible asthma or Hypersensitivity Pneumonitis All of this is working together to cause cyclical cough/LPR cough and made worse by talking  #Sinus drainage /Sinus allergies  - start netti pot saline wash three times a week - continue singulair and nasonex as before   #Possible Acid Reflux  - take otc zegerid 20mg   1  capsule daily on empty stomach    -  At all times avoid colas, spices, cheeses, spirits, red meats, beer, chocolates, fried foods etc.,   - sleep with head end of bed elevated  - eat small frequent meals  - do not go to bed for 3 hours after last meal  #Possible Asthma    - do PFT breathing test in 4 weeks  #Possible Hypersensitivity Pneumonitis  - do blood test for mold  - do HRCT chest    #Cyclical cough/Irritable Larynx  - please choose 2-3 days and observe complete voice rest - no talking or whispering  - at all times there  there is urge to cough, drink water or swallow or sip on throat lozenge - do tussionex 78ml Twice daily x 3 days during this period  #Followup - I will see you in 4 weeksl cough score at followup - any problems call or come sooner   OV 04/17/2014  Chief Complaint  Patient presents with  . Follow-up    With PFT results. Pt states she has DOE, productive cough with white and yellow mucous and wheezing. Denies CP/tightness.    FU chronic cough  -At last visit we focused on treating the sinus, acid reflux issues and simple effective measures for irritable larynx syndrome. The same time we did initiate some basic cough investigation . I am not so sure she did any of the treatment measures I recommended. In fact on medication list review I notice that she is on Advair now but then she tells me that she's been on it for years. I also noticed that she is on Prilosec although I recommended Zegerid and she doesn't initiated on Prilosec for years.  But she stated that the cough resolved but a month ago she went and did some gardening again and the cough is relapsed. Currently cough is moderate and associated with a lot of clearing of the throat and hoarse voice. Relevant investigations are listed below. The CT scan of the chest also has incidental findings that are listed below  Hypersensitivity Blood tests profile 02/24/2014 is negative  CT chest 03/03/2014  IMPRESSION:  1. No evidence of interstitial lung disease or hypersensitivity  pneumonitis. No findings to explain the patient's chronic cough.  2. Small ascending aortic aneurysm.  3. Three-vessel coronary artery calcification.  Electronically Signed  By: Lorin Picket M.D.   On: 03/03/2014 11:23   Pulmonary function test 04/17/2014 is essentially normal. Diffusion capacity is 20.6/71% and mildly reduced but the CT scan of the chest does not show any evidence of emphysema or interstitial lung disease  Review of Systems  Constitutional: Negative for fever and unexpected weight change.  HENT: Positive for congestion. Negative for dental problem, ear pain, nosebleeds, postnasal drip, rhinorrhea, sinus pressure, sneezing, sore throat and trouble swallowing.   Eyes: Negative for redness and itching.  Respiratory: Positive for cough, shortness of breath and wheezing. Negative for chest tightness.   Cardiovascular: Negative for palpitations and leg swelling.  Gastrointestinal: Negative for nausea and vomiting.  Genitourinary: Negative for dysuria.  Musculoskeletal: Negative for joint swelling.  Skin: Negative for rash.  Neurological: Negative for headaches.  Hematological: Does not bruise/bleed easily.  Psychiatric/Behavioral: Negative for dysphoric mood. The patient is not nervous/anxious.   Current outpatient prescriptions:albuterol (PROVENTIL HFA;VENTOLIN HFA) 108 (90 BASE) MCG/ACT inhaler, Inhale 1-2 puffs into the lungs every 6 (six) hours as needed for wheezing or shortness of breath., Disp: , Rfl: ;  amLODipine (NORVASC) 5 MG tablet,  Take 5 mg by mouth daily., Disp: , Rfl: ;  Azelastine HCl 0.15 % SOLN, Place into the nose 2 (two) times daily., Disp: , Rfl:  fluticasone-salmeterol (ADVAIR HFA) 115-21 MCG/ACT inhaler, Inhale 2 puffs into the lungs 2 (two) times daily., Disp: , Rfl: ;  ibuprofen (ADVIL,MOTRIN) 600 MG tablet, Take 600 mg by mouth 2 (two) times daily., Disp: , Rfl: ;  levocetirizine (XYZAL) 5 MG tablet, at bedtime., Disp: , Rfl: ;  LORazepam (ATIVAN) 1 MG tablet, Take 1 mg by mouth daily as needed. For migraines, Disp: , Rfl:  Melatonin 5 MG TABS, Take 1 tablet by mouth at bedtime. For sleep, Disp: , Rfl: ;  metoprolol succinate (TOPROL-XL) 50 MG 24 hr  tablet, Take 50 mg by mouth daily. Take with or immediately following a meal., Disp: , Rfl: ;  montelukast (SINGULAIR) 10 MG tablet, Take 1 tablet by mouth daily., Disp: , Rfl: ;  NASONEX 50 MCG/ACT nasal spray, Place into the nose daily., Disp: , Rfl:  omeprazole (PRILOSEC) 20 MG capsule, Take 20 mg by mouth daily., Disp: , Rfl: ;  orphenadrine (NORFLEX) 100 MG tablet, Take 100 mg by mouth daily as needed. takes with lorazepam for migraines, Disp: , Rfl: ;  OTEZLA 30 MG TABS, Take 1 tablet by mouth 2 (two) times daily., Disp: , Rfl: ;  sertraline (ZOLOFT) 100 MG tablet, Take 100 mg by mouth daily., Disp: , Rfl: ;  simvastatin (ZOCOR) 20 MG tablet, Take 20 mg by mouth every evening., Disp: , Rfl:       Objective:   Physical Exam  Filed Vitals:   04/17/14 1422  BP: 132/80  Pulse: 120  Height: 5\' 7"  (1.702 m)  Weight: 230 lb (104.327 kg)  SpO2: 94%   tals reviewed. Constitutional: She is oriented to person, place, and time. She appears well-developed and well-nourished. No distress.     HENT:  Head: Normocephalic and atraumatic.  Right Ear: External ear normal.  Left Ear: External ear normal.  Mouth/Throat: Oropharynx is clear and moist. No oropharyngeal exudate.   frequent cough and clearing of the throat and barking laryngeal quality to the cough  Eyes: Conjunctivae and EOM are normal. Pupils are equal, round, and reactive to light. Right eye exhibits no discharge. Left eye exhibits no discharge. No scleral icterus.  Neck: Normal range of motion. Neck supple. No JVD present. No tracheal deviation present. No thyromegaly present.  Cardiovascular: Normal rate, regular rhythm, normal heart sounds and intact distal pulses.  Exam reveals no gallop and no friction rub.   No murmur heard. Pulmonary/Chest: Effort normal and breath sounds normal. No respiratory distress. She has no wheezes. She has no rales. She exhibits no tenderness.  Abdominal: Soft. Bowel sounds are normal. She exhibits no  distension and no mass. There is no tenderness. There is no rebound and no guarding.  Musculoskeletal: Normal range of motion. She exhibits no edema and no tenderness.  Lymphadenopathy:    She has no cervical adenopathy.  Neurological: She is alert and oriented to person, place, and time. She has normal reflexes. No cranial nerve deficit. She exhibits normal muscle tone. Coordination normal.  Skin: Skin is warm and dry. No rash noted. She is not diaphoretic. No erythema. No pallor.  Psychiatric: She has a normal mood and affect. Her behavior is normal. Judgment and thought content normal.            Assessment & Plan:  #Coronary ARtery calcification and Aortica Aneurysm  - sign  release to get CD rom of our CT chest and please follow with Dr Bartholome Bill your cardiologist in [place]  # Chronic Cough   - too bad is back  - Cough is from sinus drainage, possible acid reflux, possible asthma  All of this is working together to cause cyclical cough/LPR cough or irritable larynx and made worse by talking  #Sinus drainage /Sinus allergies  -continue netti pot saline wash three times a week - continue singulair and nasonex as before  #Possible Acid Reflux  - continue PPI as before on empty stomach    -  At all times avoid colas, spices, cheeses, spirits, red meats, beer, chocolates, fried foods etc.,   - sleep with head end of bed elevated  - eat small frequent meals  - do not go to bed for 3 hours after last meal  #Possible Asthma  - continue advair as before   #Cyclical cough/Irritable Larynx  -  you will benefit from nuerontin and speech therapy but have agreed you want to see how next 8 weeks evolve before commmiting to this or referral to [facility] Dr Joya Gaskins  #Health Maintenance  - PREVNAR Vaccine today   -#Followup - I will see you in 8 weeksl cough score at followup - any problems call or come sooner

## 2014-04-17 NOTE — Assessment & Plan Note (Signed)
Again this was noticed on CT scan of the chest done for chronic cough. She will again discuss this with her cardiologist

## 2014-04-19 ENCOUNTER — Telehealth: Payer: Self-pay | Admitting: Internal Medicine

## 2014-04-19 NOTE — Telephone Encounter (Signed)
Rec'd from [facility] forward 8 pages to [facility]

## 2014-04-27 DIAGNOSIS — I341 Nonrheumatic mitral (valve) prolapse: Secondary | ICD-10-CM | POA: Insufficient documentation

## 2014-06-03 ENCOUNTER — Ambulatory Visit: Payer: Self-pay | Admitting: Family Medicine

## 2014-06-13 ENCOUNTER — Ambulatory Visit (INDEPENDENT_AMBULATORY_CARE_PROVIDER_SITE_OTHER): Payer: Medicare HMO | Admitting: Internal Medicine

## 2014-06-13 ENCOUNTER — Encounter: Payer: Self-pay | Admitting: Internal Medicine

## 2014-06-13 VITALS — BP 122/86 | HR 109 | Ht 68.0 in | Wt 233.0 lb

## 2014-06-13 DIAGNOSIS — R0609 Other forms of dyspnea: Secondary | ICD-10-CM

## 2014-06-13 DIAGNOSIS — R0600 Dyspnea, unspecified: Secondary | ICD-10-CM | POA: Insufficient documentation

## 2014-06-13 DIAGNOSIS — R053 Chronic cough: Secondary | ICD-10-CM

## 2014-06-13 DIAGNOSIS — R05 Cough: Secondary | ICD-10-CM

## 2014-06-13 DIAGNOSIS — R911 Solitary pulmonary nodule: Secondary | ICD-10-CM

## 2014-06-13 DIAGNOSIS — R059 Cough, unspecified: Secondary | ICD-10-CM

## 2014-06-13 DIAGNOSIS — R0989 Other specified symptoms and signs involving the circulatory and respiratory systems: Secondary | ICD-10-CM

## 2014-06-13 NOTE — Progress Notes (Signed)
Subjective:    Patient ID: Caitlin Leonard, female    DOB: [birth year], 68 y.o.   MRN: [id number]  HPI   IOV 02/24/14    New consult for chronic cough  68 year old  female previously well. A year ago in April 2014 approximately she started doing some gardening by removing  Weeds  one day and then abruptly she developed cough possibly with fever and green sputum production. Apparently chest x-ray at that time showed pneumonia. She was treated with antibiotics. And possibly prednisone at that time. Cough subsequently resolved in 10 weeks. She did have allergy workup in June 2014 that showed significant positive reaction for mixed Aspergillus and mold mix. To my knowledge she was not treated with allergy shots. She was maintained on steroid inhalers and after 10 weeks she started feeling well although she did have some mild basal chronic cough.  Then again on 01/04/2014 she went to the garden and removed chick weeds and then abruptly fell sick with fever, green sputum. This time apparently the chest x-ray was normal. She saw Dr. Raul Del in [place]. Dr. Raul Del is a pulmonologist. Apparently was diagnosed with asthmatic bronchitis. Unclear she had a spirometry. She was maintained on prednisone but within 4 weeks again tremendous weight so she quit taking this several weeks ago. She now rates that a chest x-ray was clear. Initially cough was productive but subsequently has become dry. Initially cough was severe but currently rated as moderate. Cough has definitely improved with the passage of time but still significant enough. Initially running low-grade fever for several weeks but subsequently resolved. Cough is associated with fatigue, ticklish sensation in the throat and occasional gag. She denies any acid reflux. Cough somewhat improved by cough suppressants. Significantly impact quality of life.   Note chronic sinus issues- seen dr [facility] in [place] few years ago. S/p surgery in  past   Of note, she is known to have psoriatic arthritis and apparently was considered for TNF alpha agents but because of the above they were not started. She is on [place] for this. Uptodate says: Phosphodiesterase-4 Enzyme Inhibitor. Respiratory: Upper respiratory tract infection (8%), nasopharyngitis (7%), sinusitis (2%), bronchitis (<2%), cough (<2%), pharyngitis (<2%), rhinitis (<2%), sinus headache (<2%).  Infection: Use caution in patients with latent infections such as tuberculosis, viral hepatitis, herpes viral infection and herpes zoster (limited experience). Use in patients with severe immunological diseases, severe acute infectious diseases, or psoriasis patients treated with immunosuppressive therapy has not been evaluated.  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   Cough is from sinus drainage, possible acid reflux, possible asthma or Hypersensitivity Pneumonitis All of this is working together to cause cyclical cough/LPR cough and made worse by talking  #Sinus drainage /Sinus allergies  - start netti pot saline wash three times a week - continue singulair and nasonex as before   #Possible Acid Reflux  - take otc zegerid 20mg   1 capsule daily on empty stomach    -  At all times avoid colas, spices, cheeses, spirits, red meats, beer, chocolates, fried foods etc.,   - sleep with head end of bed elevated  - eat small frequent meals  - do not go to bed for 3 hours after last meal  #Possible Asthma  - do PFT breathing test in 4 weeks  #Possible Hypersensitivity Pneumonitis  - do blood test for mold  - do HRCT chest    #Cyclical  cough/Irritable Larynx  - please choose 2-3 days and observe complete voice rest - no talking or whispering  - at all times there  there is urge to cough, drink water or swallow or sip on throat lozenge - do tussionex 41m Twice daily x 3 days during this period  #Followup - I will see you  in 4 weeksl cough score at followup - any problems call or come sooner   OV 04/17/2014  Chief Complaint  Patient presents with  . Follow-up    With PFT results. Pt states she has DOE, productive cough with white and yellow mucous and wheezing. Denies CP/tightness.    FU chronic cough  -At last visit we focused on treating the sinus, acid reflux issues and simple effective measures for irritable larynx syndrome. The same time we did initiate some basic cough investigation . I am not so sure she did any of the treatment measures I recommended. In fact on medication list review I notice that she is on Advair now but then she tells me that she's been on it for years. I also noticed that she is on Prilosec although I recommended Zegerid and she doesn't initiated on Prilosec for years.  But she stated that the cough resolved but a month ago she went and did some gardening again and the cough is relapsed. Currently cough is moderate and associated with a lot of clearing of the throat and hoarse voice. Relevant investigations are listed below. The CT scan of the chest also has incidental findings that are listed below  Hypersensitivity Blood tests profile 02/24/2014 is negative  CT chest 03/03/2014  IMPRESSION:  1. No evidence of interstitial lung disease or hypersensitivity  pneumonitis. No findings to explain the patient's chronic cough.  2. Small ascending aortic aneurysm.  3. Three-vessel coronary artery calcification.  4. Clustered peribronchovascular nodularity in the right lower lobe,  superior segment (series 5, image 23) is likely post infectious in  etiology  Electronically Signed  By: MLorin PicketM.D.  On: 03/03/2014 11:23   Pulmonary function test 04/17/2014 is essentially normal. Diffusion capacity is 20.6/71% and mildly reduced but the CT scan of the chest does not show any evidence of emphysema or interstitial lung disease  #Coronary ARtery calcification and [place]  Aneurysm  - sign release to get CD rom of our CT chest and please follow with Dr KBartholome [place] # Chronic Cough   - too bad is back  - Cough is from sinus drainage, possible acid reflux, possible asthma  All of this is working together to cause cyclical cough/LPR cough or irritable larynx and made worse by talking  #Sinus drainage /Sinus allergies  -continue netti pot saline wash three times a week - continue singulair and nasonex as before  #Possible Acid Reflux  - continue PPI as before on empty stomach    -  At all times avoid colas, spices, cheeses, spirits, red meats, beer, chocolates, fried foods etc.,   - sleep with head end of bed elevated  - eat small frequent meals  - do not go to bed for 3 hours after last meal  #Possible Asthma  - continue advair as before   #Cyclical cough/Irritable Larynx  - you will benefit from nuerontin and speech therapy but have agreed you want to see how next 8 weeks evolve  #Health Maintenance  - PREVNAR Vaccine today   -#Followup - I will see you in 8 weeksl cough score at followup -  any problems call or come sooner   OV 06/13/2014  Chief Complaint  Patient presents with  . Follow-up    Pt states her breathing is unchanged since last OV. Pt c/o DOE. Pt states her cough has improved. Pt denies CP/tightness.    FU cough: this is mych better. RSI cough score is 9. This is after she followeed sinus, gerd and airway Rx compliantly. STill complaints of hoarse voice after she is tired. Last visit with ENT Was few years ago; wants to defer followup now  RLL peribronchial nodularity - seen on CT may 2015. Will do fu May 2016  Dyspnea: reports insidious onset of chronic dyspnea on exertion, class 2 relieved by rest. PEr Hx cardiac stress test is negative. Not progressive but significant. Not responding to advair. CT chest not explaining dyspnea. Assoicated obesity +. She wants more testing. No ass chest pain/.       Dr Lorenza [place] Reflux Symptom Index (> 13-15 suggestive of LPR cough) 0 -> 5  =  none ->severe problem 02/24/2014  06/13/2014 After compliance  Hoarseness of problem with voice 5 3  Clearing  Of Throat 5 1  Excess throat mucus or feeling of post nasal drip 5 0  Difficulty swallowing food, liquid or tablets 1 0  Cough after eating or lying down 4 3  Breathing difficulties or choking episodes 2 1  Troublesome or annoying cough 5 1  Sensation of something sticking in throat or lump in throat 1 0  Heartburn, chest pain, indigestion, or stomach acid coming up 1 0  TOTAL 29 9    Review of Systems  Constitutional: Negative for fever and unexpected weight change.  HENT: Negative for congestion, dental problem, ear pain, nosebleeds, postnasal drip, rhinorrhea, sinus pressure, sneezing, sore throat and trouble swallowing.   Eyes: Negative for redness and itching.  Respiratory: Positive for cough and shortness of breath. Negative for chest tightness and wheezing.   Cardiovascular: Negative for palpitations and leg swelling.  Gastrointestinal: Negative for nausea and vomiting.  Genitourinary: Negative for dysuria and vaginal bleeding.  Musculoskeletal: Negative for joint swelling.  Skin: Negative for rash.  Neurological: Negative for headaches.  Hematological: Does not bruise/bleed easily.  Psychiatric/Behavioral: Negative for dysphoric mood. The patient is not nervous/anxious.    Current outpatient prescriptions:albuterol (PROVENTIL HFA;VENTOLIN HFA) 108 (90 BASE) MCG/ACT inhaler, Inhale 1-2 puffs into the lungs every 6 (six) hours as needed for wheezing or shortness of breath., Disp: , Rfl: ;  ALPRAZolam (XANAX) 0.5 MG tablet, Take 1 tablet by mouth at bedtime., Disp: , Rfl: ;  amLODipine (NORVASC) 5 MG tablet, Take 5 mg by mouth daily., Disp: , Rfl:  Azelastine HCl 0.15 % SOLN, Place into the nose 2 (two) times daily., Disp: , Rfl: ;  EPIPEN 2-PAK 0.3 MG/0.3ML SOAJ injection, as needed.,  Disp: , Rfl: ;  fluticasone-salmeterol (ADVAIR HFA) 115-21 MCG/ACT inhaler, Inhale 2 puffs into the lungs 2 (two) times daily., Disp: , Rfl: ;  ibuprofen (ADVIL,MOTRIN) 600 MG tablet, Take 600 mg by mouth 2 (two) times daily., Disp: , Rfl: ;  levocetirizine (XYZAL) 5 MG tablet, at bedtime., Disp: , Rfl:  LORazepam (ATIVAN) 1 MG tablet, Take 1 mg by mouth daily as needed. For migraines, Disp: , Rfl: ;  Melatonin 5 MG TABS, Take 1 tablet by mouth at bedtime. For sleep, Disp: , Rfl: ;  metoprolol succinate (TOPROL-XL) 50 MG 24 hr tablet, Take 50 mg by mouth daily. Take with or immediately following a meal., Disp: ,  Rfl: ;  montelukast (SINGULAIR) 10 MG tablet, Take 1 tablet by mouth daily., Disp: , Rfl:  NASONEX 50 MCG/ACT nasal spray, Place into the nose daily., Disp: , Rfl: ;  omeprazole (PRILOSEC) 20 MG capsule, Take 20 mg by mouth daily., Disp: , Rfl: ;  orphenadrine (NORFLEX) 100 MG tablet, Take 100 mg by mouth daily as needed. takes with lorazepam for migraines, Disp: , Rfl: ;  sertraline (ZOLOFT) 100 MG tablet, Take 100 mg by mouth daily., Disp: , Rfl: ;  simvastatin (ZOCOR) 20 MG tablet, Take 20 mg by mouth every evening., Disp: , Rfl:      Objective:   Physical Exam  Vitals reviewed. Constitutional: She is oriented to person, place, and time. She appears well-developed and well-nourished. No distress.  Body mass index is 35.44 kg/(m^2).   HENT:  Head: Normocephalic and atraumatic.  Right Ear: External ear normal.  Left Ear: External ear normal.  Mouth/Throat: Oropharynx is clear and moist. No oropharyngeal exudate.  Eyes: Conjunctivae and EOM are normal. Pupils are equal, round, and reactive to light. Right eye exhibits no discharge. Left eye exhibits no discharge. No scleral icterus.  Neck: Normal range of motion. Neck supple. No JVD present. No tracheal deviation present. No thyromegaly present.  Cardiovascular: Normal rate, regular rhythm, normal heart sounds and intact distal pulses.   Exam reveals no gallop and no friction rub.   No murmur heard. Pulmonary/Chest: Effort normal and breath sounds normal. No respiratory distress. She has no wheezes. She has no rales. She exhibits no tenderness.  Abdominal: Soft. Bowel sounds are normal. She exhibits no distension and no mass. There is no tenderness. There is no rebound and no guarding.  Musculoskeletal: Normal range of motion. She exhibits no edema and no tenderness.  Lymphadenopathy:    She has no cervical adenopathy.  Neurological: She is alert and oriented to person, place, and time. She has normal reflexes. No cranial nerve deficit. She exhibits normal muscle tone. Coordination normal.  Skin: Skin is warm and dry. No rash noted. She is not diaphoretic. No erythema. No pallor.  Psychiatric: She has a normal mood and affect. Her behavior is normal. Judgment and thought content normal.    Filed Vitals:   06/13/14 1337  BP: 122/86  Pulse: 109  Height: 5\' 8"  (1.727 m)  Weight: 233 lb (105.688 kg)  SpO2: 96%         Assessment & Plan:  #Shortness of breath - probably related to obesity, deconditionin or diastolic dysfunction or some combo of these  - do CPST bike test    #Right lower lobe lung nodules - seen on CT May 2015  - likely post infectious  - repeat CT chest wo contrast [id number] (to  Be ordered at followup)  #Chronic Cough   - glad is better  - Cough is from sinus drainage, possible acid reflux, possible asthma  All of this is working together to cause cyclical cough/LPR cough or irritable larynx and made worse by talking   #Sinus drainage /Sinus allergies   -continue netti pot saline wash three times a week  - continue singulair and nasonex as before and xyzal as before   #Possible Acid Reflux   - continue Prilosec and dietary control as before    #Possible Asthma   - continue advair as before    -#Followup - do CPST bike test and then return for followup

## 2014-06-13 NOTE — Patient Instructions (Addendum)
#  Shortness of breath  - do CPST bike test    #Right lower lobe lung nodules - seen on CT May 2015  - likely post infectious  - repeat CT chest wo contrast [id number] (to  Be ordered at followup)  #Chronic Cough   - glad is better  - Cough is from sinus drainage, possible acid reflux, possible asthma  All of this is working together to cause cyclical cough/LPR cough or irritable larynx and made worse by talking  #Sinus drainage /Sinus allergies  -continue netti pot saline wash three times a week - continue singulair and nasonex as before and xyzal as before  #Possible Acid Reflux  - continue Prilosec and dietary control as before   #Possible Asthma  - continue advair as before    -#Followup - do CPST bike test and then return for followup

## 2014-06-14 DIAGNOSIS — R911 Solitary pulmonary nodule: Secondary | ICD-10-CM | POA: Insufficient documentation

## 2014-06-14 NOTE — Assessment & Plan Note (Signed)
#  Right lower lobe lung nodules - seen on CT May 2015  - likely post infectious  - repeat CT chest wo contrast [id number] (to  Be ordered at followup)

## 2014-06-14 NOTE — Assessment & Plan Note (Signed)
 #  Right lower lobe lung nodules - seen on CT May 2015  - likely post infectious  - repeat CT chest wo contrast [id number] (to  Be ordered at followup)  #Chronic Cough   - glad is better  - Cough is from sinus drainage, possible acid reflux, possible asthma  All of this is working together to cause cyclical cough/LPR cough or irritable larynx and made worse by talking  #Sinus drainage /Sinus allergies  -continue netti pot saline wash three times a week - continue singulair and nasonex as before and xyzal as before  #Possible Acid Reflux  - continue Prilosec and dietary control as before   #Possible Asthma  - continue advair as before    -#Followup - do CPST bike test and then return for followup

## 2014-06-14 NOTE — Assessment & Plan Note (Signed)
#  Shortness of breath - probably related to obesity, deconditionin or diastolic dysfunction or some combo of these  - do CPST bike test

## 2014-07-14 ENCOUNTER — Ambulatory Visit (HOSPITAL_COMMUNITY): Payer: Medicare HMO | Attending: Internal Medicine

## 2014-07-14 DIAGNOSIS — R0600 Dyspnea, unspecified: Secondary | ICD-10-CM | POA: Insufficient documentation

## 2014-07-18 ENCOUNTER — Ambulatory Visit: Payer: Medicare HMO | Admitting: Internal Medicine

## 2014-07-20 ENCOUNTER — Ambulatory Visit (INDEPENDENT_AMBULATORY_CARE_PROVIDER_SITE_OTHER): Payer: Medicare HMO | Admitting: Internal Medicine

## 2014-07-20 ENCOUNTER — Encounter: Payer: Self-pay | Admitting: Internal Medicine

## 2014-07-20 VITALS — BP 134/92 | HR 86 | Ht 68.0 in | Wt 242.0 lb

## 2014-07-20 DIAGNOSIS — R911 Solitary pulmonary nodule: Secondary | ICD-10-CM

## 2014-07-20 NOTE — Progress Notes (Signed)
Subjective:    Patient ID: Caitlin Leonard, female    DOB: [birth year], 68 y.o.   MRN: [id number]  HPI   IOV 02/24/14    New consult for chronic cough  68 year old  female previously well. A year ago in April 2014 approximately she started doing some gardening by removing  Weeds  one day and then abruptly she developed cough possibly with fever and green sputum production. Apparently chest x-ray at that time showed pneumonia. She was treated with antibiotics. And possibly prednisone at that time. Cough subsequently resolved in 10 weeks. She did have allergy workup in June 2014 that showed significant positive reaction for mixed Aspergillus and mold mix. To my knowledge she was not treated with allergy shots. She was maintained on steroid inhalers and after 10 weeks she started feeling well although she did have some mild basal chronic cough.  Then again on 01/04/2014 she went to the garden and removed chick weeds and then abruptly fell sick with fever, green sputum. This time apparently the chest x-ray was normal. She saw Dr. Meredeth Ide in [place]. Dr. Meredeth Ide is a pulmonologist. Apparently was diagnosed with asthmatic bronchitis. Unclear she had a spirometry. She was maintained on prednisone but within 4 weeks again tremendous weight so she quit taking this several weeks ago. She now rates that a chest x-ray was clear. Initially cough was productive but subsequently has become dry. Initially cough was severe but currently rated as moderate. Cough has definitely improved with the passage of time but still significant enough. Initially running low-grade fever for several weeks but subsequently resolved. Cough is associated with fatigue, ticklish sensation in the throat and occasional gag. She denies any acid reflux. Cough somewhat improved by cough suppressants. Significantly impact quality of life.   Note chronic sinus issues- seen dr [facility] in [place] few years ago. S/p surgery in  past   Of note, she is known to have psoriatic arthritis and apparently was considered for TNF alpha agents but because of the above they were not started. She is on [place] for this. Uptodate says: Phosphodiesterase-4 Enzyme Inhibitor. Respiratory: Upper respiratory tract infection (8%), nasopharyngitis (7%), sinusitis (2%), bronchitis (<2%), cough (<2%), pharyngitis (<2%), rhinitis (<2%), sinus headache (<2%).  Infection: Use caution in patients with latent infections such as tuberculosis, viral hepatitis, herpes viral infection and herpes zoster (limited experience). Use in patients with severe immunological diseases, severe acute infectious diseases, or psoriasis patients treated with immunosuppressive therapy has not been evaluated.  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   Cough is from sinus drainage, possible acid reflux, possible asthma or Hypersensitivity Pneumonitis All of this is working together to cause cyclical cough/LPR cough and made worse by talking  #Sinus drainage /Sinus allergies  - start netti pot saline wash three times a week - continue singulair and nasonex as before   #Possible Acid Reflux  - take otc zegerid 20mg   1 capsule daily on empty stomach    -  At all times avoid colas, spices, cheeses, spirits, red meats, beer, chocolates, fried foods etc.,   - sleep with head end of bed elevated  - eat small frequent meals  - do not go to bed for 3 hours after last meal  #Possible Asthma  - do PFT breathing test in 4 weeks  #Possible Hypersensitivity Pneumonitis  - do blood test for mold  - do HRCT chest    #Cyclical  cough/Irritable Larynx  - please choose 2-3 days and observe complete voice rest - no talking or whispering  - at all times there  there is urge to cough, drink water or swallow or sip on throat lozenge - do tussionex 41m Twice daily x 3 days during this period  #Followup - I will see you  in 4 weeksl cough score at followup - any problems call or come sooner   OV 04/17/2014  Chief Complaint  Patient presents with  . Follow-up    With PFT results. Pt states she has DOE, productive cough with white and yellow mucous and wheezing. Denies CP/tightness.    FU chronic cough  -At last visit we focused on treating the sinus, acid reflux issues and simple effective measures for irritable larynx syndrome. The same time we did initiate some basic cough investigation . I am not so sure she did any of the treatment measures I recommended. In fact on medication list review I notice that she is on Advair now but then she tells me that she's been on it for years. I also noticed that she is on Prilosec although I recommended Zegerid and she doesn't initiated on Prilosec for years.  But she stated that the cough resolved but a month ago she went and did some gardening again and the cough is relapsed. Currently cough is moderate and associated with a lot of clearing of the throat and hoarse voice. Relevant investigations are listed below. The CT scan of the chest also has incidental findings that are listed below  Hypersensitivity Blood tests profile 02/24/2014 is negative  CT chest 03/03/2014  IMPRESSION:  1. No evidence of interstitial lung disease or hypersensitivity  pneumonitis. No findings to explain the patient's chronic cough.  2. Small ascending aortic aneurysm.  3. Three-vessel coronary artery calcification.  4. Clustered peribronchovascular nodularity in the right lower lobe,  superior segment (series 5, image 23) is likely post infectious in  etiology  Electronically Signed  By: MLorin PicketM.D.  On: 03/03/2014 11:23   Pulmonary function test 04/17/2014 is essentially normal. Diffusion capacity is 20.6/71% and mildly reduced but the CT scan of the chest does not show any evidence of emphysema or interstitial lung disease  #Coronary ARtery calcification and Aortica  Aneurysm  - sign release to get CD rom of our CT chest and please follow with Dr [place]artholome [place] # Chronic Cough   - too bad is back  - Cough is from sinus drainage, possible acid reflux, possible asthma  All of this is working together to cause cyclical cough/LPR cough or irritable larynx and made worse by talking  #Sinus drainage /Sinus allergies  -continue netti pot saline wash three times a week - continue singulair and nasonex as before  #Possible Acid Reflux  - continue PPI as before on empty stomach    -  At all times avoid colas, spices, cheeses, spirits, red meats, beer, chocolates, fried foods etc.,   - sleep with head end of bed elevated  - eat small frequent meals  - do not go to bed for 3 hours after last meal  #Possible Asthma  - continue advair as before   #Cyclical cough/Irritable Larynx  - you will benefit from nuerontin and speech therapy but have agreed you want to see how next 8 weeks evolve  #Health Maintenance  - PREVNAR Vaccine today   -#Followup - I will see you in 8 weeksl cough score at followup -  any problems call or come sooner   OV 06/13/2014  Chief Complaint  Patient presents with  . Follow-up    Pt states her breathing is unchanged since last OV. Pt c/o DOE. Pt states her cough has improved. Pt denies CP/tightness.    FU cough: this is mych better. RSI cough score is 9. This is after she followeed sinus, gerd and airway Rx compliantly. STill complaints of hoarse voice after she is tired. Last visit with ENT Was few years ago; wants to defer followup now  RLL peribronchial nodularity - seen on CT may 2015. Will do fu May 2016  Dyspnea: reports insidious onset of chronic dyspnea on exertion, class 2 relieved by rest. PEr Hx cardiac stress test is negative. Not progressive but significant. Not responding to advair. CT chest not explaining dyspnea. Assoicated obesity +. She wants more testing. No ass chest pain/.       OV 07/20/2014  Chief Complaint  Patient presents with  . Follow-up    Pt states she is not wheezing like before. Pt states she feels like she is improving. Pt here after CPST. Pt c/o DOE, mild prod cough with white/yellow mucus. Pt denies CP/tightness.     CPST fu for dyspjnea   - CPST shows redueced Vo2 max 65% and for IBW at 75%., Vo2 max is ok at 45%, Adequate effort. Normal Ventilation. Normal HRR response. No EIB. PRoblem is flat o2 pulse and stroke volume limitation all c/w circulatory limitation. She tells me stress test cardiac this summer 2015 by Dr Herb Grays is normal. Suspect diastolic dysfn  - no new issues  Dr Gretta Cool Reflux Symptom Index (> 13-15 suggestive of LPR cough) 0 -> 5  =  none ->severe problem 02/24/2014  06/13/2014 After compliance  Hoarseness of problem with voice 5 3  Clearing  Of Throat 5 1  Excess throat mucus or feeling of post nasal drip 5 0  Difficulty swallowing food, liquid or tablets 1 0  Cough after eating or lying down 4 3  Breathing difficulties or choking episodes 2 1  Troublesome or annoying cough 5 1  Sensation of something sticking in throat or lump in throat 1 0  Heartburn, chest pain, indigestion, or stomach acid coming up 1 0  TOTAL 29 9    Review of Systems  Constitutional: Negative for fever and unexpected weight change.  HENT: Negative for congestion, dental problem, ear pain, nosebleeds, postnasal drip, rhinorrhea, sinus pressure, sneezing, sore throat and trouble swallowing.   Eyes: Negative for redness and itching.  Respiratory: Positive for cough and shortness of breath. Negative for chest tightness and wheezing.   Cardiovascular: Negative for palpitations and leg swelling.  Gastrointestinal: Negative for nausea and vomiting.  Genitourinary: Negative for dysuria.  Musculoskeletal: Negative for joint swelling.  Skin: Negative for rash.  Neurological: Negative for headaches.  Hematological: Does not bruise/bleed  easily.  Psychiatric/Behavioral: Negative for dysphoric mood. The patient is not nervous/anxious.        Objective:   Physical Exam  Filed Vitals:   07/20/14 1111  BP: 134/92  Pulse: 86  Height: 5\' 8"  (1.727 m)  Weight: 242 lb (109.77 kg)  SpO2: 95%    Discussion only visit     Assessment & Plan:  #Shortness of breath CPST bike test 07/14/14: shows circulatory limitation which in presence of a normal stress test could be due to diastolic dysfunction  - will inform Dr 09/13/14 of these findings  - recommend BP control  and weight loss   #Right lower lobe lung nodules - seen on CT May 2015  - likely post infectious  - repeat CT chest wo contrast [id number]    #Chronic Cough   - glad is better  - Cough is from sinus drainage, possible acid reflux, possible asthma  All of this is working together to cause cyclical cough/LPR cough or irritable larynx and made worse by talking  #Sinus drainage /Sinus allergies  -continue netti pot saline wash three times a week - continue singulair and nasonex as before and xyzal as before  #Possible Acid Reflux  - continue Prilosec and dietary control as before   #Possible Asthma  - continue advair as before    -#Followup - May 2016 after CT chest    (> 50% of this 15 min visit spent in face to face counseling)    Dr. Brand Males, M.D., [facility].C.P Pulmonary and Critical Care Medicine Staff Physician [facility] Pager: [phone], If no answer or between  15:00h - 7:00h: call [phone]  07/20/2014 11:53 AM

## 2014-07-20 NOTE — Patient Instructions (Addendum)
#  Shortness of breath CPST bike test 07/14/14: shows circulatory limitation which in presence of a normal stress test could be due to diastolic dysfunction  - will inform Dr Lana Fish of these findings  - recommend BP control and weight loss   #Right lower lobe lung nodules - seen on CT May 2015  - likely post infectious  - repeat CT chest wo contrast [id number]    #Chronic Cough   - glad is better  - Cough is from sinus drainage, possible acid reflux, possible asthma  All of this is working together to cause cyclical cough/LPR cough or irritable larynx and made worse by talking  #Sinus drainage /Sinus allergies  -continue netti pot saline wash three times a week - continue singulair and nasonex as before and xyzal as before  #Possible Acid Reflux  - continue Prilosec and dietary control as before   #Possible Asthma  - continue advair as before    -#Followup - May 2016 after CT chest

## 2014-08-02 ENCOUNTER — Encounter: Payer: Self-pay | Admitting: Internal Medicine

## 2014-08-02 NOTE — Telephone Encounter (Signed)
Per 10.28 mychart email from pt: Message     I have had 2 pneumonia shots 1 in 2009 and another this fall. Please change my chart.   Email sent back to pt asking if her most recent PNA vaccine was the poly-23 or prevnar? The 2009 vaccine has been added to her chart

## 2014-08-24 ENCOUNTER — Ambulatory Visit: Payer: Self-pay | Admitting: Cardiology

## 2014-09-22 DIAGNOSIS — R0600 Dyspnea, unspecified: Secondary | ICD-10-CM | POA: Diagnosis not present

## 2014-10-01 ENCOUNTER — Telehealth: Payer: Self-pay | Admitting: Family

## 2014-10-01 DIAGNOSIS — R6889 Other general symptoms and signs: Secondary | ICD-10-CM

## 2014-10-01 NOTE — Progress Notes (Signed)
E visit for Flu like symptoms   We are sorry that you are not feeling well.  Here is how we plan to help! Based on what you have shared with me it looks like you may have flu-like symptoms that should be watched but do not seem to indicate anti-viral treatment.  Influenza or "the flu" is   an infection caused by a respiratory virus. The flu virus is highly contagious and persons who did not receive their yearly flu vaccination may "catch" the flu from close contact. We have anti-viral medications to treat the viruses that cause this infection. They are not a "cure" and only shorten the course of the infection. These prescriptions are most effective when they are given within the first 2 days of "flu" symptoms. They are indicated for patients who have elevated risks of complications from infection or who might exposure close contacts who are at risk. These medications can help patients avoid complications from the flu  but have side effects that you should know. Possible side effects from Tamiflu or oseltamivir include nausea, vomiting, diarrhea, dizziness, headaches, eye redness, sleep problems or other respiratory symptoms. You should not take Tamiflu if you have an allergy to oseltamivir or any to the ingredients in Tamiflu. Based upon your symptoms and potential risk factors I recommend that you follow the flu symptoms recommendation that I have listed below.  ANYONE WHO HAS FLU SYMPTOMS SHOULD: . Stay home. The flu is highly contagious and going out or to work exposes others! . Be sure to drink plenty of fluids. Water is fine as well as fruit juices, sodas and electrolyte beverages. You may want to stay away from caffeine or alcohol. If you are nauseated, try taking small sips of liquids. How do you know if you are getting enough fluid? Your urine should be a pale yellow or almost colorless. . Get rest. . Taking a steamy shower or using a humidifier may help nasal congestion and ease sore throat  pain. Using a saline nasal spray works much the same way. . Cough drops, hard candies and sore throat lozenges may ease your cough. . Line up a caregiver. Have someone check on you regularly.   GET HELP RIGHT AWAY IF: . You cannot keep down liquids or your medications. . You become short of breath . Your fell like you are going to pass out or loose consciousness. . Your symptoms persist after you have completed your treatment plan MAKE SURE YOU   Understand these instructions.  Will watch your condition.  Will get help right away if you are not doing well or get worse.  Your e-visit answers were reviewed by a board certified advanced clinical practitioner to complete your personal care plan.  Depending on the condition, your plan could have included both over the counter or prescription medications.  If there is a problem please reply  once you have received a response from your provider.  Your safety is important to [place].  If you have drug allergies check your prescription carefully.    You can use MyChart to ask questions about today's visit, request a non-urgent call back, or ask for a work or school excuse.  You will get an e-mail in the next two days asking about your experience.  I hope that your e-visit has been valuable and will speed your recovery. Thank you for using e-visits.

## 2014-10-17 ENCOUNTER — Ambulatory Visit (HOSPITAL_COMMUNITY)
Admission: RE | Admit: 2014-10-17 | Discharge: 2014-10-17 | Disposition: A | Discharge status: Home / Self Care | Payer: Medicare HMO | Source: Ambulatory Visit | Attending: Internal Medicine | Admitting: Internal Medicine

## 2014-10-17 ENCOUNTER — Telehealth (HOSPITAL_COMMUNITY): Payer: Self-pay | Admitting: Vascular Surgery

## 2014-10-17 ENCOUNTER — Encounter (HOSPITAL_COMMUNITY): Payer: Self-pay

## 2014-10-17 VITALS — BP 113/73 | HR 103 | Resp 18 | Wt 249.2 lb

## 2014-10-17 DIAGNOSIS — I712 Thoracic aortic aneurysm, without rupture: Secondary | ICD-10-CM

## 2014-10-17 DIAGNOSIS — Z7982 Long term (current) use of aspirin: Secondary | ICD-10-CM | POA: Insufficient documentation

## 2014-10-17 DIAGNOSIS — R0600 Dyspnea, unspecified: Secondary | ICD-10-CM | POA: Insufficient documentation

## 2014-10-17 DIAGNOSIS — R002 Palpitations: Secondary | ICD-10-CM

## 2014-10-17 DIAGNOSIS — I2510 Atherosclerotic heart disease of native coronary artery without angina pectoris: Secondary | ICD-10-CM | POA: Diagnosis not present

## 2014-10-17 DIAGNOSIS — I2584 Coronary atherosclerosis due to calcified coronary lesion: Secondary | ICD-10-CM

## 2014-10-17 DIAGNOSIS — E669 Obesity, unspecified: Secondary | ICD-10-CM | POA: Diagnosis not present

## 2014-10-17 DIAGNOSIS — R5381 Other malaise: Secondary | ICD-10-CM | POA: Insufficient documentation

## 2014-10-17 DIAGNOSIS — G4733 Obstructive sleep apnea (adult) (pediatric): Secondary | ICD-10-CM | POA: Insufficient documentation

## 2014-10-17 DIAGNOSIS — R053 Chronic cough: Secondary | ICD-10-CM

## 2014-10-17 DIAGNOSIS — R000 Tachycardia, unspecified: Secondary | ICD-10-CM | POA: Insufficient documentation

## 2014-10-17 DIAGNOSIS — I10 Essential (primary) hypertension: Secondary | ICD-10-CM | POA: Diagnosis not present

## 2014-10-17 DIAGNOSIS — I7121 Aneurysm of the ascending aorta, without rupture: Secondary | ICD-10-CM

## 2014-10-17 DIAGNOSIS — R05 Cough: Secondary | ICD-10-CM

## 2014-10-17 DIAGNOSIS — Z79899 Other long term (current) drug therapy: Secondary | ICD-10-CM | POA: Insufficient documentation

## 2014-10-17 DIAGNOSIS — R0602 Shortness of breath: Secondary | ICD-10-CM

## 2014-10-17 DIAGNOSIS — F329 Major depressive disorder, single episode, unspecified: Secondary | ICD-10-CM | POA: Diagnosis not present

## 2014-10-17 DIAGNOSIS — K219 Gastro-esophageal reflux disease without esophagitis: Secondary | ICD-10-CM | POA: Diagnosis not present

## 2014-10-17 LAB — BASIC METABOLIC PANEL
ANION GAP: 11 (ref 5–15)
BUN: 12 mg/dL (ref 6–23)
CHLORIDE: 105 meq/L (ref 96–112)
CO2: 20 mmol/L (ref 19–32)
Calcium: 9.7 mg/dL (ref 8.4–10.5)
Creatinine, Ser: 0.72 mg/dL (ref 0.50–1.10)
GFR calc Af Amer: >90 mL/min (ref >90)
GFR calc non Af Amer: 88 mL/min — ABNORMAL LOW (ref >90)
Glucose, Bld: 140 mg/dL — ABNORMAL HIGH (ref 70–99)
Potassium: 4.7 mmol/L (ref 3.5–5.1)
SODIUM: 136 mmol/L (ref 135–145)

## 2014-10-17 LAB — CBC
HCT: 40.5 % (ref 36.0–46.0)
HEMOGLOBIN: 13.3 g/dL (ref 12.0–15.0)
MCH: 28.9 pg (ref 26.0–34.0)
MCHC: 32.8 g/dL (ref 30.0–36.0)
MCV: 87.9 fL (ref 78.0–100.0)
PLATELETS: 274 10*3/uL (ref 150–400)
RBC: 4.61 MIL/uL (ref 3.87–5.11)
RDW: 15 % (ref 11.5–15.5)
WBC: 9.5 10*3/uL (ref 4.0–10.5)

## 2014-10-17 LAB — BRAIN NATRIURETIC PEPTIDE: B NATRIURETIC PEPTIDE 5: 31.4 pg/mL (ref 0.0–100.0)

## 2014-10-17 LAB — TSH: TSH: 4.384 u[IU]/mL (ref 0.350–4.500)

## 2014-10-17 MED ORDER — POTASSIUM CHLORIDE CRYS ER 20 MEQ PO TBCR: 20.0000 meq | EXTENDED_RELEASE_TABLET | Freq: Every day | ORAL | Status: DC

## 2014-10-17 MED ORDER — FUROSEMIDE 40 MG PO TABS: 40.0000 mg | ORAL_TABLET | Freq: Every day | ORAL | Status: DC

## 2014-10-17 MED ORDER — SIMVASTATIN 20 MG PO TABS: 20.0000 mg | ORAL_TABLET | Freq: Every evening | ORAL | Status: DC

## 2014-10-17 MED ORDER — FUROSEMIDE 20 MG PO TABS: 20.0000 mg | ORAL_TABLET | Freq: Every day | ORAL | Status: DC

## 2014-10-17 NOTE — Progress Notes (Signed)
Patient ID: Caitlin Leonard, female   DOB: 06-19-1946, 69 y.o.   MRN: [id number] PCP: Dr. Maryland Pink  69 yo with history of psoriatic arthritis, suspected asthma, and chronic cough presents for evaluation of exertional dyspnea.  Patient has been seeing pulmonary (Dr Chase Caller) for about a year for evaluation of exertional dyspnea and chronic cough.  Workup has not identified a definite pulmonary cause for her dyspnea.  She may have asthma but this probably does not explain the extent of her symptoms.    Patient has had exertional dyspnea for 6+ months.  It is quite limiting. She is short of breath walking 100 feet or walking up a flight of steps.  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  She does seem to have orthopnea and has to sleep on her side.  She feels her heart race frequently and HR runs high (sinus tachycardia).  She has episodes of lightheadedness with standing and has spells of severe presyncope about every 4 months.  She has chest pain when she lies flat in bed but not with exertion.  Omeprazole has helped this.   Patient had PFTs in 7/15 that were essentially normal.  Chest CT in 5/15 did not show interstitial lung disease.  Echo in 7/15 was unremarkable as was stress Cardiolite.  However, CPX in 10/15 showed a marked functional limitation that appeared circulatory.  Right and left heart cath in 11/15 showed no significant coronary disease and filling pressures were not elevated. She does, of note, snore and gasp in her sleep.   ECG: NSR, nonspecific T wave abnormalities.   Labs (11/15): K 4.5, Na 133, creatinine 0.7  PMH: 1. GERD 2. Depression 3. HTN 4. Suspected asthma: On Advair.  5. History chronic sinusitis/sinus surgery.  6. OSA: Resolved with weight loss, but has gained back the weight.  Not using CPAP currently.  7. Chronic cough: ?Asthma, ?GERD.  She has had extensive workup with Dr. Chase Caller.  8. Chronic exertional dyspnea: Extensive workup.  She saw Dr. Chase Caller  and no definite pulmonary etiology was found.  PFTs in 7/15 were essentially normal.  CT chest in 5/15 showed coronary calcification but no interstitial lung disease.  Echo (7/15) showed EF > 55%, no LVH, mild RV dilation with normal RV function, mild MR.  Stress Cardiolite (summer 2015) showed no ischemia.  CPX (10/15) showed RER 1.11, peak VO2 9.2 (65% predicted, moderately depressed), VE/VCO2 slope 42, flat oxygen pulse response that could be consistent with ischemia versus diastolic dysfunction.  LHC/RHC (11/15, [facility]) with no significant coronary disease, mean RA 4, PASP 14, mean PCWP 4.  9. Psoriatic arthritis: Dr. Amil Amen.  10. Coronary artery disease: Coronary calcification on CT in 5/15 but coronary angiography in 11/15 did not show significant disease.  11. Ascending aortic aneurysm: 4.0 cm on 5/15 CT.   SH: Married, lives in [place], nonsmoker, retired.    FH: No premature CAD.   ROS: All systems reviewed and negative except as per HPI.   Current Outpatient Prescriptions  Medication Sig Dispense Refill  . albuterol (PROVENTIL HFA;VENTOLIN HFA) 108 (90 BASE) MCG/ACT inhaler Inhale 1-2 puffs into the lungs every 6 (six) hours as needed for wheezing or shortness of breath.    . ALPRAZolam (XANAX) 0.5 MG tablet Take 1 tablet by mouth at bedtime.    Marland Kitchen amLODipine (NORVASC) 5 MG tablet Take 5 mg by mouth daily.    . Azelastine HCl 0.15 % SOLN Place into the nose 2 (two) times  daily.    Marland Kitchen EPIPEN 2-PAK 0.3 MG/0.3ML SOAJ injection as needed.    . fluticasone-salmeterol (ADVAIR HFA) 115-21 MCG/ACT inhaler Inhale 2 puffs into the lungs 2 (two) times daily.    Marland Kitchen ibuprofen (ADVIL,MOTRIN) 200 MG tablet Take 600 mg by mouth at bedtime.    Marland Kitchen levocetirizine (XYZAL) 5 MG tablet Take 5 mg by mouth at bedtime.     Marland Kitchen LORazepam (ATIVAN) 1 MG tablet Take 1 mg by mouth daily as needed. For migraines    . Melatonin 5 MG TABS Take 1 tablet by mouth at bedtime. For sleep    . metoprolol succinate (TOPROL-XL)  50 MG 24 hr tablet Take 50 mg by mouth daily. Take with or immediately following a meal.    . montelukast (SINGULAIR) 10 MG tablet Take 1 tablet by mouth daily.    Marland Kitchen NASONEX 50 MCG/ACT nasal spray Place 2 sprays into the nose daily.     Marland Kitchen omeprazole (PRILOSEC) 20 MG capsule Take 20 mg by mouth daily.    . orphenadrine (NORFLEX) 100 MG tablet Take 100 mg by mouth daily as needed. takes with lorazepam for migraines    . sertraline (ZOLOFT) 100 MG tablet Take 150 mg by mouth daily.     . furosemide (LASIX) 20 MG tablet Take 1 tablet (20 mg total) by mouth daily. 90 tablet 3  . potassium chloride SA (KLOR-CON M20) 20 MEQ tablet Take 1 tablet (20 mEq total) by mouth daily. 90 tablet 3  . simvastatin (ZOCOR) 20 MG tablet Take 1 tablet (20 mg total) by mouth every evening. 90 tablet 3   No current facility-administered medications for this encounter.   BP 113/73 mmHg  Pulse 103  Resp 18  Wt 249 lb 3 oz (113.031 kg)  SpO2 93% General: NAD, obese.  Neck: JVP 8 with HJR, no thyromegaly or thyroid nodule.  Lungs: Clear to auscultation bilaterally with normal respiratory effort. CV: Nondisplaced PMI.  Heart regular S1/S2, no S3/S4, no murmur.  Trace ankle edema.  No carotid bruit.  Normal pedal pulses.  Abdomen: Soft, nontender, no hepatosplenomegaly, no distention.  Skin: Intact without lesions or rashes.  Neurologic: Alert and oriented x 3.  Psych: Normal affect. Extremities: No clubbing or cyanosis.  HEENT: Normal.   Assessment/Plan: 1. Exertional dyspnea: Main complaint.  Extensive workup so far.  PFTs essentially normal, no ILD on CT chest, echo and Cardiolite unremarkable, LHC/RHC without coronary disease or evidence for significant volume overload.  It is possible that asthma contributes to the picture.  Obesity and deconditioning certainly play a role here as well.  CPX suggested circulatory limitation, but so far testing has not backed this up.  It is possible, however, that she has  diastolic dysfunction that leads to worse symptoms when she exerts herself.  On my exam, she has HJR and perhaps mild volume overload.  - Lasix 40 mg daily x 4 days then decrease to 20 mg daily.  Empiric test to see if this helps her symptoms.  - Take KCl 20 along with Lasix.  - Check BMET, BNP, CBC, and TSH today.  2. OSA: Patient had OSA in the past, lost weight and it resolved.  She has gained weight back again, and I suspect she has significant OSA.  I will arrange for sleep study.  3. Tachycardia: Patient frequently feels her heart racing.  HR runs high at baseline, sinus tachycardia.  I will arrange for 48 hour holter monitor to assess for arrhythmias and to  determine average HR.  She could have inappropriate sinus tachycardia and slowing of HR may help her symptoms.  Beta blocker or Corlanor would be options though if she has asthma, beta blocker might trigger bronchospasm.   4. CAD: Coronary calcification on CT but no significant stenosis on angiography.  Given coronary calcification, reasonable to remain on statin.  Her statin was stopped to see if it was contributing to her symptoms, but this did not help.  She can restart her prior home simvastatin dose.  She will continue ASA 81 daily.  5. HTN: BP is under reasonable control.  6. Ascending aortic aneurysm:  4.0 cm on CT in 5/15.  Repeat CT in 5/16 to follow.   Followup in 2 wks.   Loralie Champagne 10/18/2014

## 2014-10-17 NOTE — Patient Instructions (Addendum)
Will call you if any lab work from today returns abnormal, otherwise no news is good news!  START Lasix (fluid pill) 2 tablets (40mg ) for the next 4 days, then reduce daily dose to 1 tablet (20mg ) once daily.  START Potassium 1 tabelt (77meq) once daily with lasix.  RESTART Simvastatin at previous daily dose (20mg  tablet once daily).  We have referred you to pulmonary for a sleep study.  We will schedule you to have a 48 hour Holter (Cardiac Event) Monitor placed.  Do the following things EVERYDAY: 1) Weigh yourself in the morning before breakfast. Write it down and keep it in a log. 2) Take your medicines as prescribed 3) Eat low salt foods-Limit salt (sodium) to 2000 mg per day.  4) Stay as active as you can everyday       5)   Limit all fluids for the day to less than 2 liters

## 2014-10-17 NOTE — Telephone Encounter (Signed)
Open in error

## 2014-10-18 ENCOUNTER — Telehealth (HOSPITAL_COMMUNITY): Payer: Self-pay

## 2014-10-18 ENCOUNTER — Ambulatory Visit (INDEPENDENT_AMBULATORY_CARE_PROVIDER_SITE_OTHER): Payer: Medicare HMO

## 2014-10-18 DIAGNOSIS — G4733 Obstructive sleep apnea (adult) (pediatric): Secondary | ICD-10-CM | POA: Insufficient documentation

## 2014-10-18 DIAGNOSIS — R002 Palpitations: Secondary | ICD-10-CM

## 2014-10-18 NOTE — Telephone Encounter (Signed)
Lab results reviewed with patient.  Instructed to resume Lasix dose of 20mg  daily per Dr. Claris Gladden instructions.  Aware and agreeable.  Renee Pain

## 2014-10-18 NOTE — Addendum Note (Signed)
Encounter addended by: Melissa Montane, NT on: 10/18/2014 10:52 AM<BR>     Documentation filed: Charges VN

## 2014-10-31 ENCOUNTER — Encounter (HOSPITAL_COMMUNITY): Payer: Self-pay

## 2014-10-31 ENCOUNTER — Ambulatory Visit (HOSPITAL_COMMUNITY)
Admission: RE | Admit: 2014-10-31 | Discharge: 2014-10-31 | Disposition: A | Discharge status: Home / Self Care | Payer: Medicare HMO | Source: Ambulatory Visit | Attending: Cardiology | Admitting: Cardiology

## 2014-10-31 VITALS — BP 106/66 | HR 83 | Resp 18 | Wt 251.8 lb

## 2014-10-31 DIAGNOSIS — I7121 Aneurysm of the ascending aorta, without rupture: Secondary | ICD-10-CM

## 2014-10-31 DIAGNOSIS — G4733 Obstructive sleep apnea (adult) (pediatric): Secondary | ICD-10-CM | POA: Insufficient documentation

## 2014-10-31 DIAGNOSIS — R0609 Other forms of dyspnea: Secondary | ICD-10-CM | POA: Insufficient documentation

## 2014-10-31 DIAGNOSIS — I10 Essential (primary) hypertension: Secondary | ICD-10-CM | POA: Diagnosis not present

## 2014-10-31 DIAGNOSIS — I2584 Coronary atherosclerosis due to calcified coronary lesion: Secondary | ICD-10-CM

## 2014-10-31 DIAGNOSIS — I712 Thoracic aortic aneurysm, without rupture: Secondary | ICD-10-CM | POA: Diagnosis not present

## 2014-10-31 DIAGNOSIS — I2510 Atherosclerotic heart disease of native coronary artery without angina pectoris: Secondary | ICD-10-CM | POA: Insufficient documentation

## 2014-10-31 DIAGNOSIS — R0600 Dyspnea, unspecified: Secondary | ICD-10-CM

## 2014-10-31 DIAGNOSIS — R000 Tachycardia, unspecified: Secondary | ICD-10-CM | POA: Diagnosis not present

## 2014-10-31 NOTE — Progress Notes (Signed)
Patient ID: Caitlin Leonard, female   DOB: 21-Jul-1946, 69 y.o.   MRN: [id number] PCP: Dr. Maryland Pink  69 yo with history of psoriatic arthritis, suspected asthma, and chronic cough presents for evaluation of exertional dyspnea.  Patient has been seeing pulmonary (Dr Chase Caller) for about a year for evaluation of exertional dyspnea and chronic cough.  Workup has not identified a definite pulmonary cause for her dyspnea.  She may have asthma but this may not explain the extent of her symptoms.    Patient has had exertional dyspnea for 6+ months.  It is quite limiting. She is short of breath walking 100 feet or walking up a flight of steps.  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  She does seem to have orthopnea and has to sleep on her side.  She feels her heart race frequently and HR runs high (sinus tachycardia).  She has episodes of lightheadedness with standing and has spells of severe presyncope about every 4 months.  She has chest pain when she lies flat in bed but not with exertion.  Omeprazole has helped this.   Patient had PFTs in 7/15 that were essentially normal.  Chest CT in 5/15 did not show interstitial lung disease.  Echo in 7/15 was unremarkable as was stress Cardiolite.  However, CPX in 10/15 showed a marked functional limitation that appeared circulatory.  Right and left heart cath in 11/15 showed no significant coronary disease and filling pressures were not elevated. She does, of note, snore and gasp in her sleep.   After last appointment, I had her wear a holter monitor given sensation of heart racing to assess for an arrhythmia.  This showed only occasional PVCs and PACs.  I tried her empirically on Lasix.  This did not help her breathing and made her feel dehydrated so she stopped it.  She is using Advair for asthma and tells me that her dyspnea worsens on days that she does not take Advair.  She is also under a fair amount of stress and feels depressed.  She is taking Zoloft and  Xanax.   ECG: NSR, nonspecific T wave abnormalities.   Labs (11/15): K 4.5, Na 133, creatinine 0.7 Labs (1/16): K 4.7, creatinine 0.72, HCT 40.5, TSH normal, BNP 31  PMH: 1. GERD 2. Depression 3. HTN 4. Suspected asthma: On Advair.  5. History chronic sinusitis/sinus surgery.  6. OSA: Resolved with weight loss, but has gained back the weight.  Not using CPAP currently.  7. Chronic cough: ?Asthma, ?GERD.  She has had extensive workup with Dr. Chase Caller.  8. Chronic exertional dyspnea: Extensive workup.  She saw Dr. Chase Caller and no definite pulmonary etiology was found.  PFTs in 7/15 were essentially normal.  CT chest in 5/15 showed coronary calcification but no interstitial lung disease.  Echo (7/15) showed EF > 55%, no LVH, mild RV dilation with normal RV function, mild MR.  Stress Cardiolite (summer 2015) showed no ischemia.  CPX (10/15) showed RER 1.11, peak VO2 9.2 (65% predicted, moderately depressed), VE/VCO2 slope 42, flat oxygen pulse response that could be consistent with ischemia versus diastolic dysfunction.  LHC/RHC (11/15, [facility]) with no significant coronary disease, mean RA 4, PASP 14, mean PCWP 4.  9. Psoriatic arthritis: Dr. Amil Amen.  10. Coronary artery disease: Coronary calcification on CT in 5/15 but coronary angiography in 11/15 did not show significant disease.  11. Ascending aortic aneurysm: 4.0 cm on 5/15 CT.  12. Palpitations: 48 hour holter (1/16) showed  occasional PACs and PVCs with max HR 133 (sinus tachy).    SH: Married, lives in [place], nonsmoker, retired.    FH: No premature CAD.   ROS: All systems reviewed and negative except as per HPI.   Current Outpatient Prescriptions  Medication Sig Dispense Refill  . albuterol (PROVENTIL HFA;VENTOLIN HFA) 108 (90 BASE) MCG/ACT inhaler Inhale 1-2 puffs into the lungs every 6 (six) hours as needed for wheezing or shortness of breath.    . ALPRAZolam (XANAX) 0.5 MG tablet Take 1 tablet by mouth at bedtime.    Marland Kitchen  amLODipine (NORVASC) 5 MG tablet Take 5 mg by mouth daily.    . Azelastine HCl 0.15 % SOLN Place into the nose 2 (two) times daily.    . chlorpheniramine-HYDROcodone (TUSSIONEX) 10-8 MG/5ML LQCR Take 5 mLs by mouth every 12 (twelve) hours as needed for cough.    . EPIPEN 2-PAK 0.3 MG/0.3ML SOAJ injection as needed.    . fluticasone-salmeterol (ADVAIR HFA) 115-21 MCG/ACT inhaler Inhale 2 puffs into the lungs 2 (two) times daily.    Marland Kitchen levocetirizine (XYZAL) 5 MG tablet Take 5 mg by mouth at bedtime.     Marland Kitchen levofloxacin (LEVAQUIN) 500 MG tablet Take 500 mg by mouth daily.    . metoprolol succinate (TOPROL-XL) 50 MG 24 hr tablet Take 50 mg by mouth daily. Take with or immediately following a meal.    . montelukast (SINGULAIR) 10 MG tablet Take 1 tablet by mouth daily.    Marland Kitchen NASONEX 50 MCG/ACT nasal spray Place 2 sprays into the nose daily.     . predniSONE (DELTASONE) 10 MG tablet Take 10 mg by mouth daily with breakfast.    . promethazine-codeine (PHENERGAN WITH CODEINE) 6.25-10 MG/5ML syrup Take by mouth every 6 (six) hours as needed for cough.    . sertraline (ZOLOFT) 100 MG tablet Take 150 mg by mouth daily.      No current facility-administered medications for this encounter.   BP 106/66 mmHg  Pulse 83  Resp 18  Wt 251 lb 12 oz (114.193 kg)  SpO2 97% General: NAD, obese.  Neck: JVP normal, no thyromegaly or thyroid nodule.  Lungs: Clear to auscultation bilaterally with normal respiratory effort. CV: Nondisplaced PMI.  Heart regular S1/S2, no S3/S4, no murmur.  Trace ankle edema.  No carotid bruit.  Normal pedal pulses.  Abdomen: Soft, nontender, no hepatosplenomegaly, no distention.  Skin: Intact without lesions or rashes.  Neurologic: Alert and oriented x 3.  Psych: Normal affect. Extremities: No clubbing or cyanosis.  HEENT: Normal.   Assessment/Plan: 1. Exertional dyspnea: Main complaint.  Extensive workup so far.  PFTs essentially normal, no ILD on CT chest, echo and Cardiolite  unremarkable, LHC/RHC without coronary disease or evidence for significant volume overload.  CPX suggested circulatory limitation, but so far testing has not backed this up.  BNP was normal and empiric trial of Lasix did not help her symptoms.  Of note, she does report that her symptoms worsen when she does not take Advair.  I suspect that her dyspnea is multifactorial and due to asthma, obesity/deconditioning, and possibly anxiety/depression plays a role.   - Continue to follow with pulmonary for treatment of asthma.   - She needs to work on weight loss through exercise and diet.  Also, I think that pulmonary rehab may be helpful.  She can do this at [facility], will try to arrange.  2. OSA: Patient had OSA in the past, lost weight and it resolved.  She  has gained weight back again, and I suspect she has significant OSA.  I will arrange for sleep study through our office in [place].  3. Tachycardia: 48 hour holter showed occasional PACs and PVCs but no significant arrhythmias and HR average was well less than 100 bpm.   4. CAD: Coronary calcification on CT but no significant stenosis on angiography.  Given coronary calcification, reasonable to remain on statin.  Her statin was stopped to see if it was contributing to her symptoms, but this did not help.  I started her back on simvastatin. She will continue ASA 81 daily.  5. HTN: BP is under reasonable control.  6. Ascending aortic aneurysm:  4.0 cm on CT in 5/15.  Repeat CTA chest in 5/16 to follow.   Followup in 6 months.   Loralie Champagne 10/31/2014

## 2014-10-31 NOTE — Patient Instructions (Signed)
Will schedule you for a CT chest and sleep study at [facility].  Will refer you to pulmonary rehab. They will call you to arrange initial appointment.  Follow up 6 months.  Do the following things EVERYDAY: 1) Weigh yourself in the morning before breakfast. Write it down and keep it in a log. 2) Take your medicines as prescribed 3) Eat low salt foods-Limit salt (sodium) to 2000 mg per day.  4) Stay as active as you can everyday 5) Limit all fluids for the day to less than 2 liters

## 2014-11-01 ENCOUNTER — Other Ambulatory Visit (HOSPITAL_COMMUNITY): Payer: Self-pay

## 2014-11-10 ENCOUNTER — Ambulatory Visit: Payer: Self-pay | Admitting: Family Medicine

## 2014-11-16 ENCOUNTER — Telehealth: Payer: Self-pay | Admitting: Internal Medicine

## 2014-11-16 NOTE — Telephone Encounter (Signed)
Can you get her in next 2 weeks? She has already been workedup. STill things to offer are  - ent eval - she had an ent in [place] but said will hold off - she chould now see him - start gabapentin Rx / Speech Rx -t his is something I would focus on at next vist  Can you get her in next 2 weeks

## 2014-11-16 NOTE — Telephone Encounter (Signed)
Nothing open in 2 wks  You have some blocked spots started in March  Please advise thanks

## 2014-11-16 NOTE — Telephone Encounter (Signed)
Please advise MR thanks 

## 2014-11-17 NOTE — Telephone Encounter (Signed)
Schedule not yet open. Will forward to myself to book pt once schedule opens. Pt aware I will call her back once schedule opens.

## 2014-11-17 NOTE — Telephone Encounter (Signed)
See if you can get her in 3.30pm and beyond on Tuesday 11/21/14. Caitlin Leonard ok to open my schedule

## 2014-11-20 NOTE — Telephone Encounter (Signed)
Called and spoke to pt's husband. Pt is not available to come in for an appt d/t being out of town. Pt's husband stated she would call once she is available to come in. Nothing further needed at this time.

## 2014-12-04 ENCOUNTER — Encounter: Payer: Self-pay | Admitting: Cardiology

## 2014-12-05 ENCOUNTER — Encounter: Payer: Self-pay | Admitting: Cardiology

## 2014-12-15 ENCOUNTER — Encounter: Payer: Self-pay | Admitting: Internal Medicine

## 2014-12-30 ENCOUNTER — Emergency Department: Payer: Self-pay | Admitting: Emergency Medicine

## 2014-12-30 LAB — BASIC METABOLIC PANEL
Anion Gap: 9 (ref 7–16)
BUN: 7 mg/dL
CALCIUM: 8.9 mg/dL
Chloride: 101 mmol/L
Co2: 24 mmol/L
Creatinine: 0.65 mg/dL
EGFR (African American): >60
EGFR (Non-African Amer.): >60
Glucose: 167 mg/dL — ABNORMAL HIGH
POTASSIUM: 3.2 mmol/L — AB
SODIUM: 134 mmol/L — AB

## 2014-12-30 LAB — CBC
HCT: 33.2 % — AB (ref 35.0–47.0)
HGB: 10.7 g/dL — ABNORMAL LOW (ref 12.0–16.0)
MCH: 26.9 pg (ref 26.0–34.0)
MCHC: 32.2 g/dL (ref 32.0–36.0)
MCV: 84 fL (ref 80–100)
Platelet: 280 10*3/uL (ref 150–440)
RBC: 3.97 10*6/uL (ref 3.80–5.20)
RDW: 17 % — ABNORMAL HIGH (ref 11.5–14.5)
WBC: 7.6 10*3/uL (ref 3.6–11.0)

## 2014-12-30 LAB — TROPONIN I

## 2015-01-10 ENCOUNTER — Telehealth: Payer: Self-pay | Admitting: Cardiology

## 2015-01-10 NOTE — Telephone Encounter (Signed)
New Message         Pt calling stating that she has switched cardiologists and will no longer being seeing Dr. Aundra Dubin and that any future appt's she has that he ordered something needs to be canceled. Please call pt back if you have any questions.

## 2015-01-10 NOTE — Telephone Encounter (Signed)
Will forward message to Dr Aundra Dubin and his nurse as Juluis Rainier.

## 2015-01-17 NOTE — Telephone Encounter (Signed)
Noted, Dr Aundra Dubin aware

## 2015-01-19 ENCOUNTER — Other Ambulatory Visit: Payer: Self-pay | Admitting: Cardiology

## 2015-01-19 DIAGNOSIS — I719 Aortic aneurysm of unspecified site, without rupture: Secondary | ICD-10-CM

## 2015-01-23 NOTE — Op Note (Signed)
PATIENT NAME:  Caitlin Leonard, FRITCHMAN MR#:  [id number] DATE OF BIRTH:  28-Feb-1946  DATE OF PROCEDURE:  09/13/2012  PREOPERATIVE DIAGNOSIS:  Cataract, left eye.    POSTOPERATIVE DIAGNOSIS:  Cataract, left eye.  PROCEDURE PERFORMED:  Extracapsular cataract extraction using phacoemulsification with placement of an Alcon [id number], 21.0-diopter posterior chamber lens, serial # [id number].  SURGEON:  Loura Back. Barbie Croston, MD  ASSISTANT:  None.  ANESTHESIA:  4% lidocaine and 0.75% Marcaine in a 50/50 mixture with 10 units/mL of Hylenex added, given as peribulbar.  ANESTHESIOLOGIST:  Dr. [place] Service   COMPLICATIONS:  None.  ESTIMATED BLOOD LOSS:  Less than 1 mL.  DESCRIPTION OF PROCEDURE:  The patient was brought to the operating room and given a peribulbar block.  The patient was then prepped and draped in the usual fashion.  The vertical rectus muscles were imbricated using 5-0 silk sutures.  These sutures were then clamped to the sterile drapes as bridle sutures.  A limbal peritomy was performed extending two clock hours and hemostasis was obtained with cautery.  A partial thickness scleral groove was made at the surgical limbus and dissected anteriorly in a lamellar dissection using an Alcon crescent knife.  The anterior chamber was entered supero-temporally with a Superblade and through the lamellar dissection with a 2.6 mm keratome.  DisCoVisc was used to replace the aqueous and a continuous tear capsulorrhexis was carried out.  Hydrodissection and hydrodelineation were carried out with balanced salt and a 27 gauge canula.  The nucleus was rotated to confirm the effectiveness of the hydrodissection.  Phacoemulsification was carried out using a divide-and-conquer technique.  Total ultrasound time was 1 minute and 39.1 seconds with an average power of 20.5 percent, CDE 33.87.  Irrigation/aspiration was used to remove the residual cortex.  DisCoVisc was used to inflate the capsule and the internal  incision was enlarged to 3 mm with the crescent knife.  The intraocular lens was folded and inserted into the capsular bag using the AcrySert delivery system.  Irrigation/aspiration was used to remove the residual DisCoVisc.  Miostat was injected into the anterior chamber through the paracentesis track to inflate the anterior chamber and induce miosis.  The wound was checked for leaks and none were found. The conjunctiva was closed with cautery and the bridle sutures were removed.  Two drops of 0.3% Vigamox were placed on the eye.   An eye shield was placed on the eye.  The patient was discharged to the recovery room in good condition.   ____________________________ Loura Back Heela Heishman, MD sad:drc D: 09/13/2012 12:53:54 ET T: 09/13/2012 13:09:11 ET JOB#: [id number]  cc: Remo Lipps A. Bronsen Serano, MD, <Dictator> Martie Lee MD ELECTRONICALLY SIGNED 09/20/2012 13:19

## 2015-01-29 ENCOUNTER — Encounter: Payer: Self-pay | Admitting: Cardiovascular Disease

## 2015-01-29 ENCOUNTER — Ambulatory Visit (INDEPENDENT_AMBULATORY_CARE_PROVIDER_SITE_OTHER): Payer: Medicare PPO | Admitting: Cardiovascular Disease

## 2015-01-29 VITALS — BP 120/88 | HR 92 | Ht 68.0 in | Wt 246.5 lb

## 2015-01-29 DIAGNOSIS — R0600 Dyspnea, unspecified: Secondary | ICD-10-CM

## 2015-01-29 DIAGNOSIS — I712 Thoracic aortic aneurysm, without rupture: Secondary | ICD-10-CM | POA: Diagnosis not present

## 2015-01-29 DIAGNOSIS — R05 Cough: Secondary | ICD-10-CM | POA: Diagnosis not present

## 2015-01-29 DIAGNOSIS — R053 Chronic cough: Secondary | ICD-10-CM

## 2015-01-29 DIAGNOSIS — G4733 Obstructive sleep apnea (adult) (pediatric): Secondary | ICD-10-CM

## 2015-01-29 DIAGNOSIS — I7121 Aneurysm of the ascending aorta, without rupture: Secondary | ICD-10-CM

## 2015-01-29 MED ORDER — DILTIAZEM HCL 30 MG PO TABS: 30.0000 mg | ORAL_TABLET | Freq: Three times a day (TID) | ORAL | Status: DC | PRN

## 2015-01-29 MED ORDER — PROPRANOLOL HCL 20 MG PO TABS: 20.0000 mg | ORAL_TABLET | Freq: Three times a day (TID) | ORAL | Status: DC | PRN

## 2015-01-29 MED ORDER — ALBUTEROL SULFATE HFA 108 (90 BASE) MCG/ACT IN AERS
1.0000 | INHALATION_SPRAY | Freq: Four times a day (QID) | RESPIRATORY_TRACT | Status: DC | PRN
Start: 2015-01-29 — End: 2017-01-06

## 2015-01-29 MED ORDER — PROMETHAZINE-CODEINE 6.25-10 MG/5ML PO SYRP
5.0000 mL | ORAL_SOLUTION | Freq: Four times a day (QID) | ORAL | Status: DC | PRN
Start: 2015-01-29 — End: 2015-04-11

## 2015-01-29 MED ORDER — DILTIAZEM HCL ER COATED BEADS 180 MG PO CP24: 180.0000 mg | ORAL_CAPSULE | Freq: Every day | ORAL | Status: DC

## 2015-01-29 MED ORDER — AZELASTINE HCL 0.15 % NA SOLN
1.0000 | Freq: Two times a day (BID) | NASAL | Status: DC | PRN
Start: 2015-01-29 — End: 2016-09-09

## 2015-01-29 NOTE — Assessment & Plan Note (Signed)
4 cm AAA, repeat CT scan scheduled for later this spring

## 2015-01-29 NOTE — Progress Notes (Signed)
Patient ID: Caitlin Leonard, female    DOB: [birth year], 69 y.o.   MRN: [id number]  HPI Comments: Caitlin Leonard is a 69 year old woman with history of obesity, chronic cough, chronic exertional dyspnea, who presents for second opinion regarding her symptoms.  She has been seen by pulmonary for her exertional shortness of breath and chronic cough.  Previously seen by Loralie Champagne as well as cardiology through [facility] for her symptoms. Symptoms of shortness of breath for more than 6 months, very limiting. No regular exercise as she feels unable to ambulate. Also reports having tachycardia with minimal exertion. Periods of near syncope at times, occasional chest pain  She has had stents of workup including PFTs July 2015 which were normal, chest CT scan May 2015, also again not showing interstitial lung disease Echocardiogram July 2015 showing mildly elevated right heart pressures, right ventricular systolic pressure of 40 Right and left heart catheterizations November 2015 showing no significant CAD, filling pressures were not elevated Possible sleep apnea Holter monitor has been performed showing APCs and PVCs She has been tried on Lasix daily but felt dehydrated Continues on Advair  She does report that cough medication helped her with her sleeping as typically now she coughs through the night and has broken sleep. Also reports that albuterol seemed to help her bronchospastic cough. Currently does not have any albuterol but would like some. Unable to participate in pulmonary rehabilitation as she has to sick parents that live out of state both in their 71s. She often has to fly to see them  EKG on today's visit shows normal sinus rhythm with rate 92 bpm, no significant ST or T-wave changes Other past medical history Includes carotid ultrasounds from August 2010 showing minimal carotid plaquing  Staged symptom limited exercise treadmill testing implies that she has mild to moderate functional  limitation, diastolic dysfunction suggested    Allergies  Allergen Reactions  . Contrast Media [Iodinated Diagnostic Agents] Anaphylaxis  . Hydroxychloroquine Sulfate Hives  . Propranolol Hcl Hives  . Sulfonamide Derivatives Hives  . Metronidazole Rash    Outpatient Encounter Prescriptions as of 01/29/2015  Medication Sig  . ALPRAZolam (XANAX) 0.5 MG tablet Take 1 tablet by mouth at bedtime.  . chlorpheniramine-HYDROcodone (TUSSIONEX) 10-8 MG/5ML LQCR Take 5 mLs by mouth every 12 (twelve) hours as needed for cough.  . diltiazem (CARDIZEM CD) 180 MG 24 hr capsule Take 1 capsule (180 mg total) by mouth daily.  Marland Kitchen EPIPEN 2-PAK 0.3 MG/0.3ML SOAJ injection as needed.  . fluticasone-salmeterol (ADVAIR HFA) 115-21 MCG/ACT inhaler Inhale 2 puffs into the lungs 2 (two) times daily.  Marland Kitchen levocetirizine (XYZAL) 5 MG tablet Take 5 mg by mouth at bedtime.   . montelukast (SINGULAIR) 10 MG tablet Take 1 tablet by mouth daily.  Marland Kitchen NASONEX 50 MCG/ACT nasal spray Place 2 sprays into the nose daily.   . sertraline (ZOLOFT) 100 MG tablet Take 200 mg by mouth daily.   . simvastatin (ZOCOR) 20 MG tablet Take 20 mg by mouth daily.  . [DISCONTINUED] amLODipine (NORVASC) 5 MG tablet Take 5 mg by mouth daily.  . [DISCONTINUED] diltiazem (CARDIZEM CD) 120 MG 24 hr capsule Take 120 mg by mouth daily.   Marland Kitchen albuterol (PROVENTIL HFA;VENTOLIN HFA) 108 (90 BASE) MCG/ACT inhaler Inhale 1-2 puffs into the lungs every 6 (six) hours as needed for wheezing or shortness of breath.  . Azelastine HCl 0.15 % SOLN Place 1 spray into the nose 2 (two) times daily as needed.  . diltiazem (  CARDIZEM) 30 MG tablet Take 1 tablet (30 mg total) by mouth 3 (three) times daily as needed.  . promethazine-codeine (PHENERGAN WITH CODEINE) 6.25-10 MG/5ML syrup Take 5 mLs by mouth every 6 (six) hours as needed for cough.  . propranolol (INDERAL) 20 MG tablet Take 1 tablet (20 mg total) by mouth 3 (three) times daily as needed.  . [DISCONTINUED]  albuterol (PROVENTIL HFA;VENTOLIN HFA) 108 (90 BASE) MCG/ACT inhaler Inhale 1-2 puffs into the lungs every 6 (six) hours as needed for wheezing or shortness of breath.  . [DISCONTINUED] Azelastine HCl 0.15 % SOLN Place into the nose 2 (two) times daily.  . [DISCONTINUED] levofloxacin (LEVAQUIN) 500 MG tablet Take 500 mg by mouth daily.  . [DISCONTINUED] metoprolol succinate (TOPROL-XL) 50 MG 24 hr tablet Take 50 mg by mouth daily. Take with or immediately following a meal.  . [DISCONTINUED] predniSONE (DELTASONE) 10 MG tablet Take 10 mg by mouth daily with breakfast.  . [DISCONTINUED] promethazine-codeine (PHENERGAN WITH CODEINE) 6.25-10 MG/5ML syrup Take by mouth every 6 (six) hours as needed for cough.    Past Medical History  Diagnosis Date  . Hypertension     on medication x 10 years  . Heart murmur     MVP ( symptomatic)  . Dysrhythmia   . Asthma     due to seasonal alllergies  . Sleep apnea 2007    does not use CPAP since 40 # wt loss  . GERD (gastroesophageal reflux disease)   . History of IBS   . Collagenous colitis 2010  . Headache(784.0)     migraines; 2 x month  . Arthritis     osteoarthritis  . Anxiety   . Depression     situational  . Hyperlipemia   . Syncopal episodes   . Cataract   . Shortness of breath     exertional    Past Surgical History  Procedure Laterality Date  . Cardiac catheterization  2012    [facility]  . Lumbar laminectomy  [id number]  . Back surgery    . Tonsillectomy    . Knee arthroscopy  1985, 1990, 2012 x 2    bilateral  . Cholecystectomy  2011  . Nasal sinus surgery  1994  . Abdominal hysterectomy  1979  . Total knee arthroplasty  12/22/2011    Procedure: TOTAL KNEE ARTHROPLASTY;  Surgeon: Rudean Haskell, MD;  Location: [place];  Service: Orthopedics;  Laterality: Left;    Social History  reports that she has never smoked. She has never used smokeless tobacco. She reports that she drinks alcohol. She reports that she does not use illicit  drugs.  Family History family history includes Arrhythmia in her father; Hyperlipidemia in her father and mother; Hypertension in her father and mother. There is no history of Anesthesia problems.    Review of Systems  Constitutional: Negative.   Respiratory: Positive for cough and shortness of breath.   Cardiovascular: Negative.   Gastrointestinal: Negative.   Musculoskeletal: Negative.   Skin: Negative.   Neurological: Negative.   Hematological: Negative.   Psychiatric/Behavioral: Negative.   All other systems reviewed and are negative.   BP 120/88 mmHg  Pulse 92  Ht 5\' 8"  (1.727 m)  Wt 246 lb 8 oz (111.812 kg)  BMI 37.49 kg/m2  Physical Exam  Constitutional: She is oriented to person, place, and time. She appears well-developed and well-nourished.  Obese, bronchospastic cough through our entire visit  HENT:  Head: Normocephalic.  Nose: Nose normal.  Mouth/Throat:  Oropharynx is clear and moist.  Eyes: Conjunctivae are normal. Pupils are equal, round, and reactive to light.  Neck: Normal range of motion. Neck supple. No JVD present.  Cardiovascular: Normal rate, regular rhythm, S1 normal, S2 normal, normal heart sounds and intact distal pulses.  Exam reveals no gallop and no friction rub.   No murmur heard. Pulmonary/Chest: Effort normal and breath sounds normal. No respiratory distress. She has no wheezes. She has no rales. She exhibits no tenderness.  Abdominal: Soft. Bowel sounds are normal. She exhibits no distension. There is no tenderness.  Musculoskeletal: Normal range of motion. She exhibits no edema or tenderness.  Lymphadenopathy:    She has no cervical adenopathy.  Neurological: She is alert and oriented to person, place, and time. Coordination normal.  Skin: Skin is warm and dry. No rash noted. No erythema.  Psychiatric: She has a normal mood and affect. Her behavior is normal. Judgment and thought content normal.    Assessment and Plan  Nursing note and  vitals reviewed.

## 2015-01-29 NOTE — Assessment & Plan Note (Signed)
Barking, bronchospastic cough throughout our entire visit. Likely resolving upper respiratory infection from several months ago. Unable to tolerate prednisone. Unclear if repeated coughing is causing laryngeal irritation. Certainly interrupting her sleep which may be contributing to her fatigue. We have refilled her cough medication which she reports helped her with her sleep.

## 2015-01-29 NOTE — Assessment & Plan Note (Signed)
I think her shortness of breath is multifactorial. She has obesity, significant deconditioning, likely diastolic dysfunction, mixed with bronchospastic cough, also with baseline tachycardia. No ischemia noted on recent catheterization. There is mildly elevated right heart pressures again likely related to her diastolic dysfunction. We have recommended she use Lasix sparingly for days when she has significant leg swelling and shortness of breath. Recommended she try to participate in pulmonary rehabilitation at [facility] on days when she can participate (other day she needs to take care of her parents who are out of state). She needs a regular exercise regimen. Recommended weight loss through diet.  she may benefit from meeting with a dietitian. Cough medication renewed for better sleep. Albuterol renewed for her to take in the setting of bronchospastic cough when necessary. For her tachycardia, we have given her diltiazem to take when necessary, 30 mg tabs. If no improvement, suggested she try propranolol 10-20 mg as needed especially prior to exertion/exercise. Heart above 100 at rest on my check and she appeared very relaxed.  No further testing needed. She is in agreement with this

## 2015-01-29 NOTE — Assessment & Plan Note (Signed)
Will defer to pulmonary. Recommended weight loss through dietary means.

## 2015-01-29 NOTE — Patient Instructions (Addendum)
Please take lasix as needed for bad breathing days (for fluid) Take the cough syrup for sleep, Nasal spray for sleep  Try to participate in pulmonary rehab for diastolic heart issue and shortness of breath  Please take diltiazem 30 mg pills as needed for tachycardia Please take propranolol as needed for tachycardia and breathing  Please call us if you have new issues that need to be addressed before your next appt.  Your physician wants you to follow-up in: 3 months.  You will receive a reminder letter in the mail two months in advance. If you don't receive a letter, please call our office to schedule the follow-up appointment.

## 2015-02-05 ENCOUNTER — Ambulatory Visit: Payer: Medicare PPO

## 2015-02-07 ENCOUNTER — Encounter: Payer: Self-pay | Admitting: Cardiovascular Disease

## 2015-02-20 ENCOUNTER — Inpatient Hospital Stay: Admission: RE | Admit: 2015-02-20 | Payer: Medicare HMO | Source: Ambulatory Visit

## 2015-02-20 ENCOUNTER — Ambulatory Visit (INDEPENDENT_AMBULATORY_CARE_PROVIDER_SITE_OTHER)
Admission: RE | Admit: 2015-02-20 | Discharge: 2015-02-20 | Disposition: A | Discharge status: Home / Self Care | Payer: Medicare PPO | Source: Ambulatory Visit | Attending: Internal Medicine | Admitting: Internal Medicine

## 2015-02-20 DIAGNOSIS — R911 Solitary pulmonary nodule: Secondary | ICD-10-CM | POA: Diagnosis not present

## 2015-02-23 ENCOUNTER — Ambulatory Visit (INDEPENDENT_AMBULATORY_CARE_PROVIDER_SITE_OTHER): Payer: Medicare PPO | Admitting: Internal Medicine

## 2015-02-23 VITALS — BP 126/70 | HR 101 | Ht 68.0 in | Wt 246.0 lb

## 2015-02-23 DIAGNOSIS — R05 Cough: Secondary | ICD-10-CM

## 2015-02-23 DIAGNOSIS — R911 Solitary pulmonary nodule: Secondary | ICD-10-CM

## 2015-02-23 DIAGNOSIS — R053 Chronic cough: Secondary | ICD-10-CM

## 2015-02-23 LAB — NITRIC OXIDE: Nitric Oxide: 21

## 2015-02-23 MED ORDER — GABAPENTIN 300 MG PO CAPS: ORAL_CAPSULE | ORAL | Status: DC

## 2015-02-23 NOTE — Patient Instructions (Addendum)
ICD-9-CM ICD-10-CM   1. Chronic cough 786.2 R05   2. Nodule of right lung 793.11 R91.1     #Right lower lobe lung nodules - seen on CT May 2015 amd stable May 2016  - no further CT   #Chronic Cough  - too bad this is back and worse  - Cough is from sinus drainage, possible acid reflux, possible asthma  All of this is working together to cause cyclical cough/LPR cough or irritable larynx or cough hypersensitivity and made worse by talking  #Sinus drainage /Sinus allergies - continue singulair and nasonex as before and xyzal as before  #Possible Acid Reflux  - continue Prilosec and dietary control as before  #Possible Asthma  - I do not think you have this based on Normal FeNO testing 02/23/2015 =use albuterol as needed   #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 If this makes you too sleepy or drowsy call us and we will cut your medication dosing down  - Refer neuro rehabilitation voice therapy with Mr Garald Balding  -#Followup - 6 weeks with NP Tammy PArrett

## 2015-02-23 NOTE — Progress Notes (Signed)
Subjective:    Patient ID: [name], female    DOB: 03-04-1946, 69 y.o.   MRN: [id number]  HPI    IOV 02/24/14    New consult for chronic cough  69 year old  female previously well. A year ago in April 2014 approximately she started doing some gardening by removing  Weeds  one day and then abruptly she developed cough possibly with fever and green sputum production. Apparently chest x-ray at that time showed pneumonia. She was treated with antibiotics. And possibly prednisone at that time. Cough subsequently resolved in 10 weeks. She did have allergy workup in June 2014 that showed significant positive reaction for mixed Aspergillus and mold mix. To my knowledge she was not treated with allergy shots. She was maintained on steroid inhalers and after 10 weeks she started feeling well although she did have some mild basal chronic cough.  Then again on 01/04/2014 she went to the garden and removed chick weeds and then abruptly fell sick with fever, green sputum. This time apparently the chest x-ray was normal. She saw Dr. Raul Del in [place]. Dr. Raul Del is a pulmonologist. Apparently was diagnosed with asthmatic bronchitis. Unclear she had a spirometry. She was maintained on prednisone but within 4 weeks again tremendous weight so she quit taking this several weeks ago. She now rates that a chest x-ray was clear. Initially cough was productive but subsequently has become dry. Initially cough was severe but currently rated as moderate. Cough has definitely improved with the passage of time but still significant enough. Initially running low-grade fever for several weeks but subsequently resolved. Cough is associated with fatigue, ticklish sensation in the throat and occasional gag. She denies any acid reflux. Cough somewhat improved by cough suppressants. Significantly impact quality of life.   Note chronic sinus issues- seen dr [facility] in [place] few years ago. S/p surgery in  past   Of note, she is known to have psoriatic arthritis and apparently was considered for TNF alpha agents but because of the above they were not started. She is on [place] for this. Uptodate says: Phosphodiesterase-4 Enzyme Inhibitor. Respiratory: Upper respiratory tract infection (8%), nasopharyngitis (7%), sinusitis (2%), bronchitis (<2%), cough (<2%), pharyngitis (<2%), rhinitis (<2%), sinus headache (<2%).  Infection: Use caution in patients with latent infections such as tuberculosis, viral hepatitis, herpes viral infection and herpes zoster (limited experience). Use in patients with severe immunological diseases, severe acute infectious diseases, or psoriasis patients treated with immunosuppressive therapy has not been evaluated.  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   Cough is from sinus drainage, possible acid reflux, possible asthma or Hypersensitivity Pneumonitis All of this is working together to cause cyclical cough/LPR cough and made worse by talking  #Sinus drainage /Sinus allergies  - start netti pot saline wash three times a week - continue singulair and nasonex as before   #Possible Acid Reflux  - take otc zegerid $RemoveBef'20mg'gWIgCNtmwC$   1 capsule daily on empty stomach    -  At all times avoid colas, spices, cheeses, spirits, red meats, beer, chocolates, fried foods etc.,   - sleep with head end of bed elevated  - eat small frequent meals  - do not go to bed for 3 hours after last meal  #Possible Asthma  - do PFT breathing test in 4 weeks  #Possible Hypersensitivity Pneumonitis  - do blood test for mold  - do HRCT chest    #  Cyclical cough/Irritable Larynx  - please choose 2-3 days and observe complete voice rest - no talking or whispering  - at all times there  there is urge to cough, drink water or swallow or sip on throat lozenge - do tussionex 11ml Twice daily x 3 days during this period  #Followup - I will see you  in 4 weeksl cough score at followup - any problems call or come sooner   OV 04/17/2014  Chief Complaint  Patient presents with  . Follow-up    With PFT results. Pt states she has DOE, productive cough with white and yellow mucous and wheezing. Denies CP/tightness.    FU chronic cough  -At last visit we focused on treating the sinus, acid reflux issues and simple effective measures for irritable larynx syndrome. The same time we did initiate some basic cough investigation . I am not so sure she did any of the treatment measures I recommended. In fact on medication list review I notice that she is on Advair now but then she tells me that she's been on it for years. I also noticed that she is on Prilosec although I recommended Zegerid and she doesn't initiated on Prilosec for years.  But she stated that the cough resolved but a month ago she went and did some gardening again and the cough is relapsed. Currently cough is moderate and associated with a lot of clearing of the throat and hoarse voice. Relevant investigations are listed below. The CT scan of the chest also has incidental findings that are listed below  Hypersensitivity Blood tests profile 02/24/2014 is negative  CT chest 03/03/2014  IMPRESSION:  1. No evidence of interstitial lung disease or hypersensitivity  pneumonitis. No findings to explain the patient's chronic cough.  2. Small ascending aortic aneurysm.  3. Three-vessel coronary artery calcification.  4. Clustered peribronchovascular nodularity in the right lower lobe,  superior segment (series 5, image 23) is likely post infectious in  etiology  Electronically Signed  By: Lorin Picket M.D.  On: 03/03/2014 11:23   Pulmonary function test 04/17/2014 is essentially normal. Diffusion capacity is 20.6/71% and mildly reduced but the CT scan of the chest does not show any evidence of emphysema or interstitial lung disease  #Coronary ARtery calcification and Aortica  Aneurysm  - sign release to get CD rom of our CT chest and please follow with Dr Bartholome Bill your cardiologist in [place]  # Chronic Cough   - too bad is back  - Cough is from sinus drainage, possible acid reflux, possible asthma  All of this is working together to cause cyclical cough/LPR cough or irritable larynx and made worse by talking  #Sinus drainage /Sinus allergies  -continue netti pot saline wash three times a week - continue singulair and nasonex as before  #Possible Acid Reflux  - continue PPI as before on empty stomach    -  At all times avoid colas, spices, cheeses, spirits, red meats, beer, chocolates, fried foods etc.,   - sleep with head end of bed elevated  - eat small frequent meals  - do not go to bed for 3 hours after last meal  #Possible Asthma  - continue advair as before   #Cyclical cough/Irritable Larynx  - you will benefit from nuerontin and speech therapy but have agreed you want to see how next 8 weeks evolve  #Health Maintenance  - PREVNAR Vaccine today   -#Followup - I will see you in 8 weeksl cough score at  followup - any problems call or come sooner   OV 06/13/2014  Chief Complaint  Patient presents with  . Follow-up    Pt states her breathing is unchanged since last OV. Pt c/o DOE. Pt states her cough has improved. Pt denies CP/tightness.    FU cough: this is mych better. RSI cough score is 9. This is after she followeed sinus, gerd and airway Rx compliantly. STill complaints of hoarse voice after she is tired. Last visit with ENT Was few years ago; wants to defer followup now  RLL peribronchial nodularity - seen on CT may 2015. Will do fu May 2016  Dyspnea: reports insidious onset of chronic dyspnea on exertion, class 2 relieved by rest. PEr Hx cardiac stress test is negative. Not progressive but significant. Not responding to advair. CT chest not explaining dyspnea. Assoicated obesity +. She wants more testing. No ass chest pain/.       OV 07/20/2014  Chief Complaint  Patient presents with  . Follow-up    Pt states she is not wheezing like before. Pt states she feels like she is improving. Pt here after CPST. Pt c/o DOE, mild prod cough with white/yellow mucus. Pt denies CP/tightness.     CPST fu for dyspjnea   - CPST shows redueced Vo2 max 65% and for IBW at 75%., Vo2 max is ok at 45%, Adequate effort. Normal Ventilation. Normal HRR response. No EIB. PRoblem is flat o2 pulse and stroke volume limitation all c/w circulatory limitation. She tells me stress test cardiac this summer 2015 by Dr Lana Fish is normal. Suspect diastolic dysfn  - no new issues  OV 02/23/2015  Chief Complaint  Patient presents with  . Follow-up    Pt c/o non-productive cough since 09/2014. Pt states cough not getting any better. Denies f/c/s, n/v     Follow-up chronic cough and pulmonary nodule  # Pulmonary nodule  CT chest May 2016 compared to may 2015 - personally visualized image IMPRESSION: 1. Stable small pulmonary nodules compatible with a benign process. 2. Stable appearance of ascending aorta measuring 4 cm. 3. Aortic atherosclerosis and 3 vessel Coronary artery calcification. Electronically Signed  By: Kerby Moors M.D.  On: 02/20/2015 11:04   #Chronic cough - In the fall of 2015 this improved very minimal levels. She says that in December 2015 she went to [place] to see her parents and when she got back she picked up a cold and since then she's had significant severe cough. Cough is rated as severe. It is persistent. It is present both day and night. It is worse during the daytime. Talking and daytime make it worse. Nighttime and being quiet and drinking water make it better. There is associated voice hoarseness and wheezing but it appears that wheezing is mostly coming from the upper airway. There is associated ticklishness in the throat and laryngeal quality to the voice. There is a feeling of post nasal  drip. She is not doing her Advair anymore. She's been started on propranolol a few weeks ago by her car new cardiologist Dr. Ida Rogue (she has propranolol listed as an allergy but she denies this]. In addition a week ago her rheumatologist Dr. Amil Amen started her on Lyrica low-dose for neuropathic pain. She is open to switching this to gabapentin for her chronic cough. Which can also help neuropathic pain. Apparently because of a cough she is not getting her allergy shots anymore   Exhaled NO FeNO at this visit is 21 and argues against  eosinophilic asthma  Dr Lorenza Cambridge Reflux Symptom Index (> 13-15 suggestive of LPR cough) 0 -> 5  =  none ->severe problem 02/24/2014  06/13/2014 After compliance  Hoarseness of problem with voice 5 3  Clearing  Of Throat 5 1  Excess throat mucus or feeling of post nasal drip 5 0  Difficulty swallowing food, liquid or tablets 1 0  Cough after eating or lying down 4 3  Breathing difficulties or choking episodes 2 1  Troublesome or annoying cough 5 1  Sensation of something sticking in throat or lump in throat 1 0  Heartburn, chest pain, indigestion, or stomach acid coming up 1 0  TOTAL 29 9     Allergies  Allergen Reactions  . Contrast Media [Iodinated Diagnostic Agents] Anaphylaxis  . Hydroxychloroquine Sulfate Hives  . Propranolol Hcl Hives  . Sulfonamide Derivatives Hives  . Metronidazole Rash     Current outpatient prescriptions:  .  albuterol (PROVENTIL HFA;VENTOLIN HFA) 108 (90 BASE) MCG/ACT inhaler, Inhale 1-2 puffs into the lungs every 6 (six) hours as needed for wheezing or shortness of breath., Disp: 1 Inhaler, Rfl: 3 .  ALPRAZolam (XANAX) 0.5 MG tablet, Take 1 tablet by mouth at bedtime., Disp: , Rfl:  .  Azelastine HCl 0.15 % SOLN, Place 1 spray into the nose 2 (two) times daily as needed., Disp: 30 mL, Rfl: 1 .  diltiazem (CARDIZEM CD) 180 MG 24 hr capsule, Take 1 capsule (180 mg total) by mouth daily., Disp: 90 capsule, Rfl: 3 .   diltiazem (CARDIZEM) 30 MG tablet, Take 1 tablet (30 mg total) by mouth 3 (three) times daily as needed., Disp: 90 tablet, Rfl: 3 .  EPIPEN 2-PAK 0.3 MG/0.3ML SOAJ injection, as needed., Disp: , Rfl:  .  fluticasone-salmeterol (ADVAIR HFA) 115-21 MCG/ACT inhaler, Inhale 2 puffs into the lungs 2 (two) times daily., Disp: , Rfl:  .  levocetirizine (XYZAL) 5 MG tablet, Take 5 mg by mouth at bedtime. , Disp: , Rfl:  .  montelukast (SINGULAIR) 10 MG tablet, Take 1 tablet by mouth daily., Disp: , Rfl:  .  NASONEX 50 MCG/ACT nasal spray, Place 2 sprays into the nose daily. , Disp: , Rfl:  .  Pregabalin (LYRICA PO), Take by mouth 2 (two) times daily., Disp: , Rfl:  .  propranolol (INDERAL) 20 MG tablet, Take 1 tablet (20 mg total) by mouth 3 (three) times daily as needed., Disp: 90 tablet, Rfl: 3 .  sertraline (ZOLOFT) 100 MG tablet, Take 100 mg by mouth 2 (two) times daily. , Disp: , Rfl:  .  simvastatin (ZOCOR) 20 MG tablet, Take 20 mg by mouth daily., Disp: , Rfl:  .  chlorpheniramine-HYDROcodone (TUSSIONEX) 10-8 MG/5ML LQCR, Take 5 mLs by mouth every 12 (twelve) hours as needed for cough., Disp: , Rfl:  .  promethazine-codeine (PHENERGAN WITH CODEINE) 6.25-10 MG/5ML syrup, Take 5 mLs by mouth every 6 (six) hours as needed for cough. (Patient not taking: Reported on 02/23/2015), Disp: 120 mL, Rfl: 1   Review of Systems  Constitutional: Negative for fever and unexpected weight change.  HENT: Positive for sore throat. Negative for congestion, dental problem, ear pain, nosebleeds, postnasal drip, rhinorrhea, sinus pressure, sneezing and trouble swallowing.   Eyes: Negative for redness and itching.  Respiratory: Positive for cough and shortness of breath. Negative for chest tightness and wheezing.   Cardiovascular: Negative for palpitations and leg swelling.  Gastrointestinal: Negative for nausea and vomiting.  Genitourinary: Negative for dysuria.  Musculoskeletal: Negative for  joint swelling.  Skin:  Negative for rash.  Neurological: Negative for headaches.  Hematological: Does not bruise/bleed easily.  Psychiatric/Behavioral: Negative for dysphoric mood. The patient is not nervous/anxious.        Objective:   Physical Exam  Constitutional: She is oriented to person, place, and time. She appears well-developed and well-nourished. No distress.  HENT:  Head: Normocephalic and atraumatic.  Right Ear: External ear normal.  Left Ear: External ear normal.  Mouth/Throat: Oropharynx is clear and moist. No oropharyngeal exudate.  Laryngeal voice Repeatedly coughing Hoarse voice  Eyes: Conjunctivae and EOM are normal. Pupils are equal, round, and reactive to light. Right eye exhibits no discharge. Left eye exhibits no discharge. No scleral icterus.  Neck: Normal range of motion. Neck supple. No JVD present. No tracheal deviation present. No thyromegaly present.  Cardiovascular: Normal rate, regular rhythm, normal heart sounds and intact distal pulses.  Exam reveals no gallop and no friction rub.   No murmur heard. Pulmonary/Chest: Effort normal and breath sounds normal. No respiratory distress. She has no wheezes. She has no rales. She exhibits no tenderness.  Abdominal: Soft. Bowel sounds are normal. She exhibits no distension and no mass. There is no tenderness. There is no rebound and no guarding.  Musculoskeletal: Normal range of motion. She exhibits no edema or tenderness.  Now using a cane  Lymphadenopathy:    She has no cervical adenopathy.  Neurological: She is alert and oriented to person, place, and time. She has normal reflexes. No cranial nerve deficit. She exhibits normal muscle tone. Coordination normal.  Skin: Skin is warm and dry. No rash noted. She is not diaphoretic. No erythema. No pallor.  Psychiatric:  Flat affect is new  Vitals reviewed.   Filed Vitals:   02/23/15 1440  BP: 126/70  Pulse: 101  Height: $Remove'5\' 8"'XBzJtaZ$  (1.727 m)  Weight: 111.585 kg (246 lb)  SpO2: 94%          Assessment & Plan:     ICD-9-CM ICD-10-CM   1. Chronic cough 786.2 R05 Ambulatory Referral to Neuro Rehab     Nitric oxide  2. Nodule of right lung 793.11 R91.1 Ambulatory Referral to Neuro Rehab   #Right lower lobe lung nodules - seen on CT May 2015 amd stable May 2016  - no further CT   #Chronic Cough  - too bad this is back and worse  - Cough is from sinus drainage, possible acid reflux, possible asthma  All of this is working together to cause cyclical cough/LPR cough or irritable larynx or cough hypersensitivity and made worse by talking  #Sinus drainage /Sinus allergies - continue singulair and nasonex as before and xyzal as before  #Possible Acid Reflux  - continue Prilosec and dietary control as before  #Possible Asthma  - I do not think you have this based on Normal FeNO testing 02/23/2015 =use albuterol as needed   #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 If this makes you too sleepy or drowsy call us and we will cut your medication dosing down  - Refer neuro rehabilitation voice therapy with Mr Garald Balding  -#Followup - 6 weeks with NP Tammy PArrett

## 2015-02-25 ENCOUNTER — Telehealth: Payer: Self-pay | Admitting: Internal Medicine

## 2015-02-25 NOTE — Telephone Encounter (Signed)
Can you put the vital signs for this visit in may 20th 2016 please?  Thanks  MR

## 2015-02-26 ENCOUNTER — Encounter: Payer: Self-pay | Admitting: Internal Medicine

## 2015-02-26 NOTE — Telephone Encounter (Signed)
Pt worked up by [employer], vital signs kept by [employer]. Updated pt's chart with vitals. Will sign off.

## 2015-03-06 ENCOUNTER — Encounter: Payer: Medicare PPO | Attending: Cardiovascular Disease | Admitting: Respiratory Therapy

## 2015-03-06 ENCOUNTER — Encounter: Payer: Self-pay | Admitting: Cardiovascular Disease

## 2015-03-06 ENCOUNTER — Encounter: Payer: Self-pay | Admitting: Respiratory Therapy

## 2015-03-06 VITALS — BP 128/86 | HR 89 | Resp 12 | Ht 68.0 in | Wt 254.0 lb

## 2015-03-06 DIAGNOSIS — J4520 Mild intermittent asthma, uncomplicated: Secondary | ICD-10-CM | POA: Insufficient documentation

## 2015-03-06 DIAGNOSIS — R053 Chronic cough: Secondary | ICD-10-CM

## 2015-03-06 DIAGNOSIS — R05 Cough: Secondary | ICD-10-CM

## 2015-03-06 NOTE — Patient Instructions (Signed)
pul Patient Instructions  Patient Details  Name: GAIL VENDETTI MRN: [id number] Date of Birth: [birth year] Referring Provider:  [place] Pink, MD  Below are the personal goals you chose as well as exercise and nutrition goals. Our goal is to help you keep on track towards obtaining and maintaining your goals. We will be discussing your progress on these goals with you throughout the program.  Initial Exercise Prescription:   Exercise Goals: Frequency: Be able to perform aerobic exercise three times per week working toward 3-5 days per week.  Intensity: Work with a perceived exertion of 11 (fairly light) - 15 (hard) as tolerated. Follow your new exercise prescription and watch for changes in prescription as you progress with the program. Changes will be reviewed with you when they are made.  Duration: You should be able to do 30 minutes of continuous aerobic exercise in addition to a 5 minute warm-up and a 5 minute cool-down routine.  Nutrition Goals: Your personal nutrition goals will be established when you do your nutrition analysis with the dietician.  The following are nutrition guidelines to follow: Cholesterol < 200mg /day Sodium < 1500mg /day Fiber: Women over 50 yrs - 21 grams per day  Personal Goals:   Tobacco Use Initial Evaluation: History  Smoking status   Never Smoker   Smokeless tobacco   Never Used    Copy of goals given to participant.

## 2015-03-06 NOTE — Telephone Encounter (Signed)
Spoke w/pt. °

## 2015-03-06 NOTE — Progress Notes (Signed)
Pulmonary Individual Treatment Plan  Patient Details  Name: Caitlin Leonard MRN: [id number] Date of Birth: 16-Oct-1945 Referring Provider:  [place] Pink, MD  Initial Encounter Date:    Visit Diagnosis: Asthma, mild intermittent, uncomplicated  Chronic cough  Patient's Home Medications on Admission:  Current outpatient prescriptions:    albuterol (PROVENTIL HFA;VENTOLIN HFA) 108 (90 BASE) MCG/ACT inhaler, Inhale 1-2 puffs into the lungs every 6 (six) hours as needed for wheezing or shortness of breath., Disp: 1 Inhaler, Rfl: 3   ALPRAZolam (XANAX) 0.5 MG tablet, Take 1 tablet by mouth at bedtime., Disp: , Rfl:    Azelastine HCl 0.15 % SOLN, Place 1 spray into the nose 2 (two) times daily as needed., Disp: 30 mL, Rfl: 1   chlorpheniramine-HYDROcodone (TUSSIONEX) 10-8 MG/5ML LQCR, Take 5 mLs by mouth every 12 (twelve) hours as needed for cough., Disp: , Rfl:    diltiazem (CARDIZEM CD) 180 MG 24 hr capsule, Take 1 capsule (180 mg total) by mouth daily., Disp: 90 capsule, Rfl: 3   diltiazem (CARDIZEM) 30 MG tablet, Take 1 tablet (30 mg total) by mouth 3 (three) times daily as needed., Disp: 90 tablet, Rfl: 3   EPIPEN 2-PAK 0.3 MG/0.3ML SOAJ injection, as needed., Disp: , Rfl:    fluticasone-salmeterol (ADVAIR HFA) 115-21 MCG/ACT inhaler, Inhale 2 puffs into the lungs 2 (two) times daily., Disp: , Rfl:    gabapentin (NEURONTIN) 300 MG capsule, 300mg  once daily x 3 days, then 300mg  twice daily x 3 days, then 300mg  three times daily to continue., Disp: 100 capsule, Rfl: 1   levocetirizine (XYZAL) 5 MG tablet, Take 5 mg by mouth at bedtime. , Disp: , Rfl:    montelukast (SINGULAIR) 10 MG tablet, Take 1 tablet by mouth daily., Disp: , Rfl:    NASONEX 50 MCG/ACT nasal spray, Place 2 sprays into the nose daily. , Disp: , Rfl:    Pregabalin (LYRICA PO), Take by mouth 2 (two) times daily., Disp: , Rfl:    promethazine-codeine (PHENERGAN WITH CODEINE) 6.25-10 MG/5ML syrup, Take 5 mLs by  mouth every 6 (six) hours as needed for cough. (Patient not taking: Reported on 02/23/2015), Disp: 120 mL, Rfl: 1   propranolol (INDERAL) 20 MG tablet, Take 1 tablet (20 mg total) by mouth 3 (three) times daily as needed., Disp: 90 tablet, Rfl: 3   sertraline (ZOLOFT) 100 MG tablet, Take 100 mg by mouth 2 (two) times daily. , Disp: , Rfl:    simvastatin (ZOCOR) 20 MG tablet, Take 20 mg by mouth daily., Disp: , Rfl:   Past Medical History: Past Medical History  Diagnosis Date   Hypertension     on medication x 10 years   Heart murmur     MVP ( symptomatic)   Dysrhythmia    Asthma     due to seasonal alllergies   Sleep apnea 2007    does not use CPAP since 40 # wt loss   GERD (gastroesophageal reflux disease)    History of IBS    Collagenous colitis 2010   Headache(784.0)     migraines; 2 x month   Arthritis     osteoarthritis   Anxiety    Depression     situational   Hyperlipemia    Syncopal episodes    Cataract    Shortness of breath     exertional    Tobacco Use: History  Smoking status   Never Smoker   Smokeless tobacco   Never Used    Labs:  Recent Review Flowsheet Data    There is no flowsheet data to display.       ADL [facility]:     ADL [facility]      03/06/15 0900       ADL [facility]   ADL Phase Entry     SOB Score total 44     Rest 0     Walk 3     Stairs 5     Bath 2     Dress 2     Shop 4         Pulmonary Function Assessment:     Pulmonary Function Assessment - 03/06/15 0900    Pulmonary Function Tests   RV% 75 %   DLCO% 71 %   Initial Spirometry Results   FVC% 90 %   FEV1% 96 %   FEV1/FVC Ratio 82   Post Bronchodilator Spirometry Results   FVC% 98 %   FEV1% 105 %   FEV1/FVC Ratio 82      Exercise Target Goals:    Exercise Program Goal: Individual exercise prescription set with THRR, safety & activity barriers. Participant demonstrates ability to understand and report RPE using BORG scale, to self-measure pulse  accurately, and to acknowledge the importance of the exercise prescription.  Exercise Prescription Goal: Starting with aerobic activity 30 plus minutes a day, 3 days per week for initial exercise prescription. Provide home exercise prescription and guidelines that participant acknowledges understanding prior to discharge.  Activity Barriers & Risk Stratification:     Activity Barriers & Risk Stratification - 03/06/15 0900    Activity Barriers & Risk Stratification   Activity Barriers Left Knee Replacement   Risk Stratification Low      6 Minute Walk:   Initial Exercise Prescription:   Exercise Prescription Changes:   Discharge Exercise Prescription:    Nutrition:  Target Goals: Understanding of nutrition guidelines, daily intake of sodium 1500mg , cholesterol 200mg , calories 30% from fat and 7% or less from saturated fats, daily to have 5 or more servings of fruits and vegetables.  Biometrics:    Nutrition Therapy Plan and Nutrition Goals:     Nutrition Therapy & Goals - 03/06/15 0900    Nutrition Therapy   Diet Caitlin Leonard prefers not to meet with the dietitian, although her goal to loss weight is 55lbs; she knows able mutrition and what to do.      Nutrition Discharge: Rate Your Plate Scores:   Psychosocial: Target Goals: Acknowledge presence or absence of depression, maximize coping skills, provide positive support system. Participant is able to verbalize types and ability to use techniques and skills needed for reducing stress and depression.  Initial Review & Psychosocial Screening:     Initial Psych Review & Screening - 03/06/15 0900    Initial Review   Current issues with Current Depression   Family Dynamics   Good Support System? Yes  Good support from her husband and 2 sons.   Comments Caitlin Leonard is dealing with her parent's health - they are both in their 20's with serious health concerns, and she is the only child. She also has depression from her  medical conserns.   Barriers   Psychosocial barriers to participate in program The patient should benefit from training in stress management and relaxation.   Screening Interventions   Interventions Encouraged to exercise  Caitlin Leonard meets with a counselor weekly.      Quality of Life Scores:     Quality of Life -  03/06/15 0900    Quality of Life Scores   Health/Function Pre 7.69 %   Socioeconomic Pre 27 %   Psych/Spiritual Pre 12.14 %   Family Pre 10.8 %   GLOBAL Pre 12.47 %      PHQ-9:     Recent Review Flowsheet Data    Depression screen [facility] 2/9 03/06/2015   Decreased Interest 1   Down, Depressed, Hopeless 2   PHQ - 2 Score 3   Altered sleeping 1   Tired, decreased energy 1   Change in appetite 1   Feeling bad or failure about yourself  2   Trouble concentrating 1   Moving slowly or fidgety/restless 0   Suicidal thoughts 0   PHQ-9 Score 9   Difficult doing work/chores Somewhat difficult      Psychosocial Evaluation and Intervention:   Psychosocial Re-Evaluation:  Education: Education Goals: Education classes will be provided on a weekly basis, covering required topics. Participant will state understanding/return demonstration of topics presented.  Learning Barriers/Preferences:   Education Topics: Initial Evaluation Education: - Verbal, written and demonstration of respiratory meds, RPE/PD scales, oximetry and breathing techniques. Instruction on use of nebulizers and MDIs: cleaning and proper use, rinsing mouth with steroid doses and importance of monitoring MDI activations.      Most Recent Value   Date  03/06/15   Educator  LB   Instruction Review Code  2- meets goals/outcomes      General Nutrition Guidelines/Fats and Fiber: -Group instruction provided by verbal, written material, models and posters to present the general guidelines for heart healthy nutrition. Gives an explanation and review of dietary fats and fiber.   Controlling  Sodium/Reading Food Labels: -Group verbal and written material supporting the discussion of sodium use in heart healthy nutrition. Review and explanation with models, verbal and written materials for utilization of the food label.   Exercise Physiology & Risk Factors: - Group verbal and written instruction with models to review the exercise physiology of the cardiovascular system and associated critical values. Details cardiovascular disease risk factors and the goals associated with each risk factor.   Aerobic Exercise & Resistance Training: - Gives group verbal and written discussion on the health impact of inactivity. On the components of aerobic and resistive training programs and the benefits of this training and how to safely progress through these programs.   Flexibility, Balance, General Exercise Guidelines: - Provides group verbal and written instruction on the benefits of flexibility and balance training programs. Provides general exercise guidelines with specific guidelines to those with heart or lung disease. Demonstration and skill practice provided.   Stress Management: - Provides group verbal and written instruction about the health risks of elevated stress, cause of high stress, and healthy ways to reduce stress.   Depression: - Provides group verbal and written instruction on the correlation between heart/lung disease and depressed mood, treatment options, and the stigmas associated with seeking treatment.   Exercise & Equipment Safety: - Individual verbal instruction and demonstration of equipment use and safety with use of the equipment.   Infection Prevention: - Provides verbal and written material to individual with discussion of infection control including proper hand washing and proper equipment cleaning during exercise session.      Most Recent Value   Date  03/06/15   Educator  LB   Instruction Review Code  2- meets goals/outcomes      Falls  Prevention: - Provides verbal and written material to individual with discussion of falls  prevention and safety.      Most Recent Value   Date  03/06/15   Educator  LB   Instruction Review Code  2- meets goals/outcomes      Diabetes: - Individual verbal and written instruction to review signs/symptoms of diabetes, desired ranges of glucose level fasting, after meals and with exercise. Advice that pre and post exercise glucose checks will be done for 3 sessions at entry of program.   Chronic Lung Diseases: - Group verbal and written instruction to review new updates, new respiratory medications, new advancements in procedures and treatments. Provide informative websites and "800" numbers of self-education.   Lung Procedures: - Group verbal and written instruction to describe testing methods done to diagnose lung disease. Review the outcome of test results. Describe the treatment choices: Pulmonary Function Tests, ABGs and oximetry.   Energy Conservation: - Provide group verbal and written instruction for methods to conserve energy, plan and organize activities. Instruct on pacing techniques, use of adaptive equipment and posture/positioning to relieve shortness of breath.   Triggers: - Group verbal and written instruction to review types of environmental controls: home humidity, furnaces, filters, dust mite/pet prevention, HEPA vacuums. To discuss weather changes, air quality and the benefits of nasal washing.   Exacerbations: - Group verbal and written instruction to provide: warning signs, infection symptoms, calling MD promptly, preventive modes, and value of vaccinations. Review: effective airway clearance, coughing and/or vibration techniques. Create an Sports administrator.   Oxygen: - Individual and group verbal and written instruction on oxygen therapy. Includes supplement oxygen, available portable oxygen systems, continuous and intermittent flow rates, oxygen safety, concentrators,  and Medicare reimbursement for oxygen.   Respiratory Medications: - Group verbal and written instruction to review medications for lung disease. Drug class, frequency, complications, importance of spacers, rinsing mouth after steroid MDI's, and proper cleaning methods for nebulizers.      Most Recent Value   Date  03/06/15   Educator  LB   Instruction Review Code  2- meets goals/outcomes      AED/CPR: - Group verbal and written instruction with the use of models to demonstrate the basic use of the AED with the basic ABC's of resuscitation.   Breathing Retraining: - Provides individuals verbal and written instruction on purpose, frequency, and proper technique of diaphragmatic breathing and pursed-lipped breathing. Applies individual practice skills.      Most Recent Value   Date  03/06/15   Educator  LB   Instruction Review Code  2- meets goals/outcomes      Anatomy and Physiology of the Lungs: - Group verbal and written instruction with the use of models to provide basic lung anatomy and physiology related to function, structure and complications of lung disease.   Heart Failure: - Group verbal and written instruction on the basics of heart failure: signs/symptoms, treatments, explanation of ejection fraction, enlarged heart and cardiomyopathy.   Sleep Apnea: - Individual verbal and written instruction to review Obstructive Sleep Apnea. Review of risk factors, methods for diagnosing and types of masks and machines for OSA.   Anxiety: - Provides group, verbal and written instruction on the correlation between heart/lung disease and anxiety, treatment options, and management of anxiety.   Relaxation: - Provides group, verbal and written instruction about the benefits of relaxation for patients with heart/lung disease. Also provides patients with examples of relaxation techniques.   Knowledge Questionnaire Score:     Knowledge Questionnaire Score - 03/06/15 0900     Knowledge Questionnaire Score  Pre Score -3      Personal Goals and Risk Factors at Admission:   Personal Goals and Risk Factors Review:    Personal Goals Discharge:    Comments: Caitlin Leonard plans to attend [facility] 3 days/week; the 1000am class.

## 2015-03-06 NOTE — Progress Notes (Signed)
Pulmonary Individual Treatment Plan  Patient Details  Name: Caitlin Leonard MRN: [id number] Date of Birth: 04/14/1946 Referring Provider:  Maryland Pink, MD  Initial Encounter Date: Date: 03/06/15  Visit Diagnosis: Asthma, mild intermittent, uncomplicated  Chronic cough  Patient's Home Medications on Admission:  Current outpatient prescriptions:  .  albuterol (PROVENTIL HFA;VENTOLIN HFA) 108 (90 BASE) MCG/ACT inhaler, Inhale 1-2 puffs into the lungs every 6 (six) hours as needed for wheezing or shortness of breath., Disp: 1 Inhaler, Rfl: 3 .  ALPRAZolam (XANAX) 0.5 MG tablet, Take 1 tablet by mouth at bedtime., Disp: , Rfl:  .  Azelastine HCl 0.15 % SOLN, Place 1 spray into the nose 2 (two) times daily as needed., Disp: 30 mL, Rfl: 1 .  chlorpheniramine-HYDROcodone (TUSSIONEX) 10-8 MG/5ML LQCR, Take 5 mLs by mouth every 12 (twelve) hours as needed for cough., Disp: , Rfl:  .  diltiazem (CARDIZEM CD) 180 MG 24 hr capsule, Take 1 capsule (180 mg total) by mouth daily., Disp: 90 capsule, Rfl: 3 .  diltiazem (CARDIZEM) 30 MG tablet, Take 1 tablet (30 mg total) by mouth 3 (three) times daily as needed., Disp: 90 tablet, Rfl: 3 .  EPIPEN 2-PAK 0.3 MG/0.3ML SOAJ injection, as needed., Disp: , Rfl:  .  fluticasone-salmeterol (ADVAIR HFA) 115-21 MCG/ACT inhaler, Inhale 2 puffs into the lungs 2 (two) times daily., Disp: , Rfl:  .  gabapentin (NEURONTIN) 300 MG capsule, 300mg  once daily x 3 days, then 300mg  twice daily x 3 days, then 300mg  three times daily to continue., Disp: 100 capsule, Rfl: 1 .  levocetirizine (XYZAL) 5 MG tablet, Take 5 mg by mouth at bedtime. , Disp: , Rfl:  .  montelukast (SINGULAIR) 10 MG tablet, Take 1 tablet by mouth daily., Disp: , Rfl:  .  NASONEX 50 MCG/ACT nasal spray, Place 2 sprays into the nose daily. , Disp: , Rfl:  .  Pregabalin (LYRICA PO), Take by mouth 2 (two) times daily., Disp: , Rfl:  .  promethazine-codeine (PHENERGAN WITH CODEINE) 6.25-10 MG/5ML syrup,  Take 5 mLs by mouth every 6 (six) hours as needed for cough. (Patient not taking: Reported on 02/23/2015), Disp: 120 mL, Rfl: 1 .  propranolol (INDERAL) 20 MG tablet, Take 1 tablet (20 mg total) by mouth 3 (three) times daily as needed., Disp: 90 tablet, Rfl: 3 .  sertraline (ZOLOFT) 100 MG tablet, Take 100 mg by mouth 2 (two) times daily. , Disp: , Rfl:  .  simvastatin (ZOCOR) 20 MG tablet, Take 20 mg by mouth daily., Disp: , Rfl:   Past Medical History: Past Medical History  Diagnosis Date  . Hypertension     on medication x 10 years  . Heart murmur     MVP ( symptomatic)  . Dysrhythmia   . Asthma     due to seasonal alllergies  . Sleep apnea 2007    does not use CPAP since 40 # wt loss  . GERD (gastroesophageal reflux disease)   . History of IBS   . Collagenous colitis 2010  . Headache(784.0)     migraines; 2 x month  . Arthritis     osteoarthritis  . Anxiety   . Depression     situational  . Hyperlipemia   . Syncopal episodes   . Cataract   . Shortness of breath     exertional    Tobacco Use: History  Smoking status  . Never Smoker   Smokeless tobacco  . Never Used    Labs:  Recent Review Flowsheet Data    There is no flowsheet data to display.       ADL UCSD:     ADL UCSD      03/06/15 0900       ADL UCSD   ADL Phase Entry     SOB Score total 44     Rest 0     Walk 3     Stairs 5     Bath 2     Dress 2     Shop 4         Pulmonary Function Assessment:     Pulmonary Function Assessment - 03/06/15 0900    Pulmonary Function Tests   RV% 75 %   DLCO% 71 %   Initial Spirometry Results   FVC% 90 %   FEV1% 96 %   FEV1/FVC Ratio 82   Post Bronchodilator Spirometry Results   FVC% 98 %   FEV1% 105 %   FEV1/FVC Ratio 82      Exercise Target Goals: Date: 03/06/15  Exercise Program Goal: Individual exercise prescription set with THRR, safety & activity barriers. Participant demonstrates ability to understand and report RPE using BORG  scale, to self-measure pulse accurately, and to acknowledge the importance of the exercise prescription.  Exercise Prescription Goal: Starting with aerobic activity 30 plus minutes a day, 3 days per week for initial exercise prescription. Provide home exercise prescription and guidelines that participant acknowledges understanding prior to discharge.  Activity Barriers & Risk Stratification:     Activity Barriers & Risk Stratification - 03/06/15 0900    Activity Barriers & Risk Stratification   Activity Barriers Left Knee Replacement   Risk Stratification Low      6 Minute Walk:     6 Minute Walk      03/06/15 1333       6 Minute Walk   Phase Initial     Distance 805 feet     Walk Time 6 minutes     Resting HR 89 bpm     Resting BP 128/86 mmHg     Max Ex. HR 133 bpm     Max Ex. BP 152/80 mmHg     RPE 15     Perceived Dyspnea  4     Symptoms No        Initial Exercise Prescription:     Initial Exercise Prescription - 03/06/15 1300    Date of Initial Exercise Prescription   Date 03/06/15   Treadmill   MPH 1.4   Grade 0   Minutes 10   Bike   Level 0.4   Watts 30   Minutes 10   Recumbant Bike   Level 2   RPM 40   Watts 20   Minutes 10   NuStep   Level 2   Watts 40   Minutes 10   Arm Ergometer   Level 1   Watts 10   Minutes 10   Arm/Foot Ergometer   Level 1   Watts 12   Minutes 10   Recumbant Elliptical   Level 2   RPM 40   Watts 20   Minutes 10   Elliptical   Level 1   Speed 3   Minutes 1   REL-XR   Level 2   Watts 40   Minutes 10   Prescription Details   Frequency (times per week) 3   Duration Progress to 30 minutes of continuous aerobic without signs/symptoms of physical distress  Intensity   THRR REST +  30   Ratings of Perceived Exertion 11-15   Perceived Dyspnea 2-4   Progression Continue progressive overload as per policy without signs/symptoms or physical distress.   Resistance Training   Training Prescription Yes   Weight  1   Reps 10-12      Exercise Prescription Changes:   Discharge Exercise Prescription:    Nutrition:  Target Goals: Understanding of nutrition guidelines, daily intake of sodium 1500mg , cholesterol 200mg , calories 30% from fat and 7% or less from saturated fats, daily to have 5 or more servings of fruits and vegetables.  Biometrics:     Pre Biometrics - 03/06/15 1339    Pre Biometrics   Height 5\' 8"  (1.727 m)   Weight 254 lb (115.214 kg)   Waist Circumference 41.5 inches   Hip Circumference 51 inches   Waist to Hip Ratio 0.81 %   BMI (Calculated) 38.7       Nutrition Therapy Plan and Nutrition Goals:     Nutrition Therapy & Goals - 03/06/15 0900    Nutrition Therapy   Diet Ms Yebra prefers not to meet with the dietitian, although her goal to loss weight is 55lbs; she knows able mutrition and what to do.      Nutrition Discharge: Rate Your Plate Scores:   Psychosocial: Target Goals: Acknowledge presence or absence of depression, maximize coping skills, provide positive support system. Participant is able to verbalize types and ability to use techniques and skills needed for reducing stress and depression.  Initial Review & Psychosocial Screening:     Initial Psych Review & Screening - 03/06/15 0900    Initial Review   Current issues with Current Depression   Family Dynamics   Good Support System? Yes  Good support from her husband and 2 sons.   Comments Ms Keithley is dealing with her parent's health - they are both in their 52's with serious health concerns, and she is the only child. She also has depression from her medical conserns.   Barriers   Psychosocial barriers to participate in program The patient should benefit from training in stress management and relaxation.   Screening Interventions   Interventions Encouraged to exercise  Ms Ayllon meets with a counselor weekly.      Quality of Life Scores:     Quality of Life - 03/06/15 0900    Quality  of Life Scores   Health/Function Pre 7.69 %   Socioeconomic Pre 27 %   Psych/Spiritual Pre 12.14 %   Family Pre 10.8 %   GLOBAL Pre 12.47 %      PHQ-9:     Recent Review Flowsheet Data    Depression screen [facility] 2/9 03/06/2015   Decreased Interest 1   Down, Depressed, Hopeless 2   PHQ - 2 Score 3   Altered sleeping 1   Tired, decreased energy 1   Change in appetite 1   Feeling bad or failure about yourself  2   Trouble concentrating 1   Moving slowly or fidgety/restless 0   Suicidal thoughts 0   PHQ-9 Score 9   Difficult doing work/chores Somewhat difficult      Psychosocial Evaluation and Intervention:   Psychosocial Re-Evaluation:  Education: Education Goals: Education classes will be provided on a weekly basis, covering required topics. Participant will state understanding/return demonstration of topics presented.  Learning Barriers/Preferences:   Education Topics: Initial Evaluation Education: - Verbal, written and demonstration of respiratory meds, RPE/PD scales, oximetry  and breathing techniques. Instruction on use of nebulizers and MDIs: cleaning and proper use, rinsing mouth with steroid doses and importance of monitoring MDI activations.      Most Recent Value   Date  03/06/15   Educator  LB   Instruction Review Code  2- meets goals/outcomes      General Nutrition Guidelines/Fats and Fiber: -Group instruction provided by verbal, written material, models and posters to present the general guidelines for heart healthy nutrition. Gives an explanation and review of dietary fats and fiber.   Controlling Sodium/Reading Food Labels: -Group verbal and written material supporting the discussion of sodium use in heart healthy nutrition. Review and explanation with models, verbal and written materials for utilization of the food label.   Exercise Physiology & Risk Factors: - Group verbal and written instruction with models to review the exercise physiology of the  cardiovascular system and associated critical values. Details cardiovascular disease risk factors and the goals associated with each risk factor.   Aerobic Exercise & Resistance Training: - Gives group verbal and written discussion on the health impact of inactivity. On the components of aerobic and resistive training programs and the benefits of this training and how to safely progress through these programs.   Flexibility, Balance, General Exercise Guidelines: - Provides group verbal and written instruction on the benefits of flexibility and balance training programs. Provides general exercise guidelines with specific guidelines to those with heart or lung disease. Demonstration and skill practice provided.   Stress Management: - Provides group verbal and written instruction about the health risks of elevated stress, cause of high stress, and healthy ways to reduce stress.   Depression: - Provides group verbal and written instruction on the correlation between heart/lung disease and depressed mood, treatment options, and the stigmas associated with seeking treatment.   Exercise & Equipment Safety: - Individual verbal instruction and demonstration of equipment use and safety with use of the equipment.   Infection Prevention: - Provides verbal and written material to individual with discussion of infection control including proper hand washing and proper equipment cleaning during exercise session.      Most Recent Value   Date  03/06/15   Educator  LB   Instruction Review Code  2- meets goals/outcomes      Falls Prevention: - Provides verbal and written material to individual with discussion of falls prevention and safety.      Most Recent Value   Date  03/06/15   Educator  LB   Instruction Review Code  2- meets goals/outcomes      Diabetes: - Individual verbal and written instruction to review signs/symptoms of diabetes, desired ranges of glucose level fasting, after meals  and with exercise. Advice that pre and post exercise glucose checks will be done for 3 sessions at entry of program.   Chronic Lung Diseases: - Group verbal and written instruction to review new updates, new respiratory medications, new advancements in procedures and treatments. Provide informative websites and "800" numbers of self-education.   Lung Procedures: - Group verbal and written instruction to describe testing methods done to diagnose lung disease. Review the outcome of test results. Describe the treatment choices: Pulmonary Function Tests, ABGs and oximetry.   Energy Conservation: - Provide group verbal and written instruction for methods to conserve energy, plan and organize activities. Instruct on pacing techniques, use of adaptive equipment and posture/positioning to relieve shortness of breath.   Triggers: - Group verbal and written instruction to review types of environmental controls:  home humidity, furnaces, filters, dust mite/pet prevention, HEPA vacuums. To discuss weather changes, air quality and the benefits of nasal washing.   Exacerbations: - Group verbal and written instruction to provide: warning signs, infection symptoms, calling MD promptly, preventive modes, and value of vaccinations. Review: effective airway clearance, coughing and/or vibration techniques. Create an Sports administrator.   Oxygen: - Individual and group verbal and written instruction on oxygen therapy. Includes supplement oxygen, available portable oxygen systems, continuous and intermittent flow rates, oxygen safety, concentrators, and Medicare reimbursement for oxygen.   Respiratory Medications: - Group verbal and written instruction to review medications for lung disease. Drug class, frequency, complications, importance of spacers, rinsing mouth after steroid MDI's, and proper cleaning methods for nebulizers.      Most Recent Value   Date  03/06/15   Educator  LB   Instruction Review Code  2-  meets goals/outcomes      AED/CPR: - Group verbal and written instruction with the use of models to demonstrate the basic use of the AED with the basic ABC's of resuscitation.   Breathing Retraining: - Provides individuals verbal and written instruction on purpose, frequency, and proper technique of diaphragmatic breathing and pursed-lipped breathing. Applies individual practice skills.      Most Recent Value   Date  03/06/15   Educator  LB   Instruction Review Code  2- meets goals/outcomes      Anatomy and Physiology of the Lungs: - Group verbal and written instruction with the use of models to provide basic lung anatomy and physiology related to function, structure and complications of lung disease.   Heart Failure: - Group verbal and written instruction on the basics of heart failure: signs/symptoms, treatments, explanation of ejection fraction, enlarged heart and cardiomyopathy.   Sleep Apnea: - Individual verbal and written instruction to review Obstructive Sleep Apnea. Review of risk factors, methods for diagnosing and types of masks and machines for OSA.   Anxiety: - Provides group, verbal and written instruction on the correlation between heart/lung disease and anxiety, treatment options, and management of anxiety.   Relaxation: - Provides group, verbal and written instruction about the benefits of relaxation for patients with heart/lung disease. Also provides patients with examples of relaxation techniques.   Knowledge Questionnaire Score:     Knowledge Questionnaire Score - 03/06/15 0900    Knowledge Questionnaire Score   Pre Score -3      Personal Goals and Risk Factors at Admission:     Personal Goals and Risk Factors at Admission - 03/06/15 0900    Personal Goals and Risk Factors on Admission    Weight Management Yes   Intervention Learn and follow the exercise and diet guidelines while in the program. Utilize the nutrition and education classes to  help gain knowledge of the diet and exercise expectations in the program   Increase Aerobic Exercise and Physical Activity Yes   Intervention While in program, learn and follow the exercise prescription taught. Start at a low level workload and increase workload after able to maintain previous level for 30 minutes. Increase time before increasing intensity.   Understand more about Heart/Pulmonary Disease. Yes   Intervention While in program utilize professionals for any questions, and attend the education sessions. Great websites to use are www.americanheart.org or www.lung.org for reliable information.   Improve shortness of breath with ADL's Yes   Intervention While in program, learn and follow the exercise prescription taught. Start at a low level workload and increase workload ad advised  by the exercise physiologist. Increase time before increasing intensity.   Develop more efficient breathing techniques such as purse lipped breathing and diaphragmatic breathing; and practicing self-pacing with activity Yes   Intervention While in program, learn and utilize the specific breathing techniques taught to you. Continue to practice and use the techniques as needed.   Increase knowledge of respiratory medications and ability to use respiratory devices properly.  Yes   Intervention While in program learn and demonstrate appropriate use of your oxygen therapy by increasing flow with exertion, manage oxygen tank operation, including continuous and intermittent flow.  Understanding oxygen is a drug ordered by your physician.   Hypertension Yes   Goal Participant will see blood pressure controlled within the values of 140/58mm/Hg or within value directed by their physician.   Intervention Provide nutrition & aerobic exercise along with prescribed medications to achieve BP 140/90 or less.   Stress Yes   Goal To meet with psychosocial counselor for stress and relaxation information and guidance. To state  understanding of performing relaxation techniques and or identifying personal stressors.   Intervention Provide education on types of stress, identifiying stressors, and ways to cope with stress. Provide demonstration and active practice of relaxation techniques.      Personal Goals and Risk Factors Review:    Personal Goals Discharge:    Comments:

## 2015-03-09 ENCOUNTER — Encounter: Payer: Medicare PPO | Attending: Cardiovascular Disease | Admitting: *Deleted

## 2015-03-09 DIAGNOSIS — J4520 Mild intermittent asthma, uncomplicated: Secondary | ICD-10-CM | POA: Diagnosis present

## 2015-03-09 NOTE — Progress Notes (Signed)
Daily Session Note  Patient Details  Name: Caitlin Leonard MRN: [id number] Date of Birth: [birth year] Referring Provider:  Minna Merritts, MD  Encounter Date: 03/09/2015  Check In:     Session Check In - 03/09/15 1057    Check-In   Staff Present Heath Lark RN, BSN, CCRP;Laureen Owens Shark BS, RRT, Respiratory Therapist;Renee Dillard Essex MS, ACSM CEP Exercise Physiologist   ER physicians immediately available to respond to emergencies [facility] immediately available ER MD   Physician(s) Dr. Glenna Durand. Archie Balboa, Dr. Reita Cliche   Medication changes reported     No   Fall or balance concerns reported    No   Warm-up and Cool-down Performed on first and last piece of equipment   VAD Patient? No   Pain Assessment   Currently in Pain? Yes   Pain Score 7    Pain Location Knee  Both her knees hurt. Had surgery on one years ago.    Pain Orientation Right;Left   Pain Descriptors / Indicators Sore   Pain Type Chronic pain   Pain Onset More than a month ago   Pain Frequency Constant   Aggravating Factors  Walking, steps   Pain Relieving Factors Nothing much         Goals Met:  Proper associated with RPD/PD & O2 Sat Exercise tolerated well  Goals Unmet:  Not Applicable  Goals Comments: Both her knees have hurt for years. She had surgery on one knee with physical therapy after but her knees still hurt. She was told she needs knee surgery on the other one but she said she doesn't want to have it until she can't walk since her first knee surgery.    Dr. Emily Filbert is Medical Director for [place] and [facility].

## 2015-03-12 ENCOUNTER — Encounter: Payer: Medicare PPO | Admitting: *Deleted

## 2015-03-12 DIAGNOSIS — J4520 Mild intermittent asthma, uncomplicated: Secondary | ICD-10-CM | POA: Diagnosis not present

## 2015-03-12 DIAGNOSIS — R05 Cough: Secondary | ICD-10-CM

## 2015-03-12 DIAGNOSIS — R053 Chronic cough: Secondary | ICD-10-CM

## 2015-03-12 NOTE — Progress Notes (Signed)
Daily Session Note  Patient Details  Name: Caitlin Leonard MRN: [id number] Date of Birth: 10-30-1945 Referring Provider:  Maryland Pink, MD  Encounter Date: 03/12/2015  Check In:     Session Check In - 03/12/15 1034    Check-In   Staff Present Candiss Norse MS, ACSM CEP Exercise Physiologist;Laureen Janell Quiet, RRT, Respiratory Therapist;Steven Way BS, ACSM EP-C, Exercise Physiologist;Kelly Alfonso Patten, ACSM CEP Exercise Physiologist   ER physicians immediately available to respond to emergencies [facility] immediately available ER MD   Physician(s) Jacqualine Code and Kinner   Medication changes reported     No   Fall or balance concerns reported    No   Warm-up and Cool-down Performed on first and last piece of equipment   VAD Patient? No   Pain Assessment   Currently in Pain? No/denies   Multiple Pain Sites No         Goals Met:  Proper associated with RPD/PD & O2 Sat Exercise tolerated well Strength training completed today  Goals Unmet:  Not Applicable  Goals Comments: [name] will be out several weeks to take care of her parents and their medical needs.   Dr. Emily Filbert is Medical Director for [place] and [facility].

## 2015-03-12 NOTE — Progress Notes (Signed)
Daily Session Note  Patient Details  Name: Caitlin Leonard MRN: [id number] Date of Birth: January 31, 1946 Referring Provider:  Maryland Pink, MD  Encounter Date: 03/12/2015  Check In:     Session Check In - 03/12/15 1034    Check-In   Staff Present Candiss Norse MS, ACSM CEP Exercise Physiologist;Laureen Janell Quiet, RRT, Respiratory Therapist;Steven Way BS, ACSM EP-C, Exercise Physiologist;Sarahann Horrell Alfonso Patten, ACSM CEP Exercise Physiologist   ER physicians immediately available to respond to emergencies [facility] immediately available ER MD   Physician(s) Jacqualine Code and Kinner   Medication changes reported     No   Fall or balance concerns reported    No   Warm-up and Cool-down Performed on first and last piece of equipment   VAD Patient? No   Pain Assessment   Currently in Pain? No/denies   Multiple Pain Sites No         Goals Met:  Proper associated with RPD/PD & O2 Sat Independence with exercise equipment Exercise tolerated well Strength training completed today  Goals Unmet:  Not Applicable  Goals Comments:    Dr. Emily Filbert is Medical Director for [place] and [facility].

## 2015-03-23 ENCOUNTER — Encounter: Payer: Medicare PPO | Admitting: Respiratory Therapy

## 2015-03-23 VITALS — BP 114/80 | HR 97 | Wt 260.0 lb

## 2015-03-23 DIAGNOSIS — J4520 Mild intermittent asthma, uncomplicated: Secondary | ICD-10-CM | POA: Diagnosis not present

## 2015-03-23 DIAGNOSIS — R05 Cough: Secondary | ICD-10-CM

## 2015-03-23 DIAGNOSIS — R053 Chronic cough: Secondary | ICD-10-CM

## 2015-03-23 NOTE — Progress Notes (Signed)
Daily Session Note  Patient Details  Name: Caitlin Leonard MRN: [id number] Date of Birth: [birth year] Referring Provider:  Maryland Pink, MD  Encounter Date: 03/23/2015  Check In:     Session Check In - 03/23/15 1059    Check-In   Staff Present Heath Lark RN, BSN, CCRP;Stacey Blanch Media RRT, RCP Respiratory Therapist;Jarelle Ates Dillard Essex MS, ACSM CEP Exercise Physiologist   ER physicians immediately available to respond to emergencies [facility] immediately available ER MD   Physician(s) Corky Downs and Joni Fears   Medication changes reported     No   Fall or balance concerns reported    No   Warm-up and Cool-down Performed on first and last piece of equipment   VAD Patient? No   Pain Assessment   Currently in Pain? No/denies   Multiple Pain Sites No           Exercise Prescription Changes - 03/23/15 1100    Response to Exercise   Duration Progress to 30 minutes of continuous aerobic without signs/symptoms of physical distress   Progression Continue progressive overload as per policy without signs/symptoms or physical distress.   Resistance Training   Training Prescription Yes   Weight 1   Reps 10-15   Interval Training   Interval Training No   NuStep   Level 2   Watts 40   Minutes 15   REL-XR   Level 2   Watts 30   Minutes 15      Goals Met:  Proper associated with RPD/PD & O2 Sat Independence with exercise equipment Using PLB without cueing & demonstrates good technique Exercise tolerated well Strength training completed today  Goals Unmet:  Not Applicable  Goals Comments: Was happy to return to class today after dealing with the death of her father. She really enjoys the exercise and it has been a good stress relief for her.    Dr. Emily Filbert is Medical Director for [place] and [facility].

## 2015-03-26 ENCOUNTER — Encounter: Payer: Medicare PPO | Admitting: *Deleted

## 2015-03-26 DIAGNOSIS — J4520 Mild intermittent asthma, uncomplicated: Secondary | ICD-10-CM

## 2015-03-26 NOTE — Progress Notes (Unsigned)
Pulmonary Individual Treatment Plan  Patient Details  Name: Caitlin Leonard MRN: [id number] Date of Birth: 01/13/1946 Referring Provider:  Maryland Pink, MD  Initial Encounter Date:    Visit Diagnosis: Asthma, mild intermittent, uncomplicated  Patient's Home Medications on Admission:  Current outpatient prescriptions:  .  albuterol (PROVENTIL HFA;VENTOLIN HFA) 108 (90 BASE) MCG/ACT inhaler, Inhale 1-2 puffs into the lungs every 6 (six) hours as needed for wheezing or shortness of breath., Disp: 1 Inhaler, Rfl: 3 .  ALPRAZolam (XANAX) 0.5 MG tablet, Take 1 tablet by mouth at bedtime., Disp: , Rfl:  .  Azelastine HCl 0.15 % SOLN, Place 1 spray into the nose 2 (two) times daily as needed., Disp: 30 mL, Rfl: 1 .  chlorpheniramine-HYDROcodone (TUSSIONEX) 10-8 MG/5ML LQCR, Take 5 mLs by mouth every 12 (twelve) hours as needed for cough., Disp: , Rfl:  .  diltiazem (CARDIZEM CD) 180 MG 24 hr capsule, Take 1 capsule (180 mg total) by mouth daily., Disp: 90 capsule, Rfl: 3 .  diltiazem (CARDIZEM) 30 MG tablet, Take 1 tablet (30 mg total) by mouth 3 (three) times daily as needed., Disp: 90 tablet, Rfl: 3 .  EPIPEN 2-PAK 0.3 MG/0.3ML SOAJ injection, as needed., Disp: , Rfl:  .  fluticasone-salmeterol (ADVAIR HFA) 115-21 MCG/ACT inhaler, Inhale 2 puffs into the lungs 2 (two) times daily., Disp: , Rfl:  .  gabapentin (NEURONTIN) 300 MG capsule, 318m once daily x 3 days, then 3059mtwice daily x 3 days, then 30031mhree times daily to continue., Disp: 100 capsule, Rfl: 1 .  levocetirizine (XYZAL) 5 MG tablet, Take 5 mg by mouth at bedtime. , Disp: , Rfl:  .  montelukast (SINGULAIR) 10 MG tablet, Take 1 tablet by mouth daily., Disp: , Rfl:  .  NASONEX 50 MCG/ACT nasal spray, Place 2 sprays into the nose daily. , Disp: , Rfl:  .  Pregabalin (LYRICA PO), Take by mouth 2 (two) times daily., Disp: , Rfl:  .  promethazine-codeine (PHENERGAN WITH CODEINE) 6.25-10 MG/5ML syrup, Take 5 mLs by mouth every 6 (six)  hours as needed for cough. (Patient not taking: Reported on 02/23/2015), Disp: 120 mL, Rfl: 1 .  propranolol (INDERAL) 20 MG tablet, Take 1 tablet (20 mg total) by mouth 3 (three) times daily as needed., Disp: 90 tablet, Rfl: 3 .  sertraline (ZOLOFT) 100 MG tablet, Take 100 mg by mouth 2 (two) times daily. , Disp: , Rfl:  .  simvastatin (ZOCOR) 20 MG tablet, Take 20 mg by mouth daily., Disp: , Rfl:   Past Medical History: Past Medical History  Diagnosis Date  . Hypertension     on medication x 10 years  . Heart murmur     MVP ( symptomatic)  . Dysrhythmia   . Asthma     due to seasonal alllergies  . Sleep apnea 2007    does not use CPAP since 40 # wt loss  . GERD (gastroesophageal reflux disease)   . History of IBS   . Collagenous colitis 2010  . Headache(784.0)     migraines; 2 x month  . Arthritis     osteoarthritis  . Anxiety   . Depression     situational  . Hyperlipemia   . Syncopal episodes   . Cataract   . Shortness of breath     exertional    Tobacco Use: History  Smoking status  . Never Smoker   Smokeless tobacco  . Never Used    Labs: Recent Review Flowsheet  Data    There is no flowsheet data to display.       ADL UCSD:     ADL UCSD      03/06/15 0900       ADL UCSD   ADL Phase Entry     SOB Score total 44     Rest 0     Walk 3     Stairs 5     Bath 2     Dress 2     Shop 4         Pulmonary Function Assessment:     Pulmonary Function Assessment - 03/06/15 0900    Pulmonary Function Tests   RV% 75 %   DLCO% 71 %   Initial Spirometry Results   FVC% 90 %   FEV1% 96 %   FEV1/FVC Ratio 82   Post Bronchodilator Spirometry Results   FVC% 98 %   FEV1% 105 %   FEV1/FVC Ratio 82      Exercise Target Goals:    Exercise Program Goal: Individual exercise prescription set with THRR, safety & activity barriers. Participant demonstrates ability to understand and report RPE using BORG scale, to self-measure pulse accurately, and to  acknowledge the importance of the exercise prescription.  Exercise Prescription Goal: Starting with aerobic activity 30 plus minutes a day, 3 days per week for initial exercise prescription. Provide home exercise prescription and guidelines that participant acknowledges understanding prior to discharge.  Activity Barriers & Risk Stratification:     Activity Barriers & Risk Stratification - 03/06/15 0900    Activity Barriers & Risk Stratification   Activity Barriers Left Knee Replacement   Risk Stratification Low      6 Minute Walk:     6 Minute Walk      03/06/15 1333       6 Minute Walk   Phase Initial     Distance 805 feet     Walk Time 6 minutes     Resting HR 89 bpm     Resting BP 128/86 mmHg     Max Ex. HR 133 bpm     Max Ex. BP 152/80 mmHg     RPE 15     Perceived Dyspnea  4     Symptoms No        Initial Exercise Prescription:     Initial Exercise Prescription - 03/09/15 1300    NuStep   Level 2   Watts 40   Minutes 10   REL-XR   Level 3   Watts 20   Minutes 10      Exercise Prescription Changes:     Exercise Prescription Changes      03/23/15 1100 03/26/15 1000         Exercise Review   Progression  Yes      Response to Exercise   Duration Progress to 30 minutes of continuous aerobic without signs/symptoms of physical distress Progress to 30 minutes of continuous aerobic without signs/symptoms of physical distress      Progression Continue progressive overload as per policy without signs/symptoms or physical distress. Continue progressive overload as per policy without signs/symptoms or physical distress.      Resistance Training   Training Prescription Yes Yes      Weight 1 1      Reps 10-15 10-15      Interval Training   Interval Training No No      NuStep   Level 2 2  Watts 40 40      Minutes 15 15      REL-XR   Level 2 2      Watts 30 30      Minutes 15 15         Discharge Exercise Prescription:    Nutrition:   Target Goals: Understanding of nutrition guidelines, daily intake of sodium <1511m, cholesterol <2026m calories 30% from fat and 7% or less from saturated fats, daily to have 5 or more servings of fruits and vegetables.  Biometrics:     Pre Biometrics - 03/06/15 1339    Pre Biometrics   Height 5' 8" (1.727 m)   Weight 254 lb (115.214 kg)   Waist Circumference 41.5 inches   Hip Circumference 51 inches   Waist to Hip Ratio 0.81 %   BMI (Calculated) 38.7       Nutrition Therapy Plan and Nutrition Goals:     Nutrition Therapy & Goals - 03/06/15 0900    Nutrition Therapy   Diet Ms BeStembridgerefers not to meet with the dietitian, although her goal to loss weight is 55lbs; she knows able mutrition and what to do.      Nutrition Discharge: Rate Your Plate Scores:   Psychosocial: Target Goals: Acknowledge presence or absence of depression, maximize coping skills, provide positive support system. Participant is able to verbalize types and ability to use techniques and skills needed for reducing stress and depression.  Initial Review & Psychosocial Screening:     Initial Psych Review & Screening - 03/06/15 0900    Initial Review   Current issues with Current Depression   Family Dynamics   Good Support System? Yes  Good support from her husband and 2 sons.   Comments Ms BePaulis dealing with her parent's health - they are both in their 906'sith serious health concerns, and she is the only child. She also has depression from her medical conserns.   Barriers   Psychosocial barriers to participate in program The patient should benefit from training in stress management and relaxation.   Screening Interventions   Interventions Encouraged to exercise  Ms BeBamburgeets with a counselor weekly.      Quality of Life Scores:     Quality of Life - 03/06/15 0900    Quality of Life Scores   Health/Function Pre 7.69 %   Socioeconomic Pre 27 %   Psych/Spiritual Pre 12.14 %   Family  Pre 10.8 %   GLOBAL Pre 12.47 %      PHQ-9:     Recent Review Flowsheet Data    [facility]   Decreased Interest 1   Down, Depressed, Hopeless 2   PHQ - 2 Score 3   Altered sleeping 1   Tired, decreased energy 1   Change in appetite 1   Feeling bad or failure about yourself  2   Trouble concentrating 1   Moving slowly or fidgety/restless 0   Suicidal thoughts 0   PHQ-9 Score 9   Difficult doing work/chores Somewhat difficult      Psychosocial Evaluation and Intervention:     Psychosocial Evaluation - 03/12/15 1232    Psychosocial Evaluation & Interventions   Interventions Stress management education;Relaxation education;Encouraged to exercise with the program and follow exercise prescription   Comments Counselor met with Ms. Lantz for initial psychosocial evaluation.  She is a [age]ear old who came into this program as a result of breathing problems and an "  undiagnosed cough" that she has struggled with for quite some time.  Ms. Timberman has a strong support  system with a spouse of 40 years and is actively involved in her faith community.  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  Ms. Schillaci states she has struggled with depression for the past (3) years subsequent to caring for her sick parents long distance (as they live in [place], and refuse to move to [facility]).  Ms. Kinderman has been seeing a psychologist/counselor to support her through this time as well as taking medication as prescribed.  She reports stress from caring from her parents, traveling back and forth to help them in [place], and her own medical issues.  Her goals for this program are to increase her energy and ability to breathe better and possibly lose some weight in the process.     Continued Psychosocial Services Needed Yes  Ms. Campoy  will benefit from the psychoeducational components of this program, especially stress management and depression.  She also will benefit from the relaxation components as well as seeing the dietician for help with her weight loss goals.      Psychosocial Re-Evaluation:  Education: Education Goals: Education classes will be provided on a weekly basis, covering required topics. Participant will state understanding/return demonstration of topics presented.  Learning Barriers/Preferences:   Education Topics: Initial Evaluation Education: - Verbal, written and demonstration of respiratory meds, RPE/PD scales, oximetry and breathing techniques. Instruction on use of nebulizers and MDIs: cleaning and proper use, rinsing mouth with steroid doses and importance of monitoring MDI activations.   General Nutrition Guidelines/Fats and Fiber: -Group instruction provided by verbal, written material, models and posters to present the general guidelines for heart healthy nutrition. Gives an explanation and review of dietary fats and fiber.   Controlling Sodium/Reading Food Labels: -Group verbal and written material supporting the discussion of sodium use in heart healthy nutrition. Review and explanation with models, verbal and written materials for utilization of the food label.   Exercise Physiology & Risk Factors: - Group verbal and written instruction with models to review the exercise physiology of the cardiovascular system and associated critical values. Details cardiovascular disease risk factors and the goals associated with each risk factor.   Aerobic Exercise & Resistance Training: - Gives group verbal and written discussion on the health impact of inactivity. On the components of aerobic and resistive training programs and the benefits of this training and how to safely progress through these programs.   Flexibility, Balance, General Exercise Guidelines: - Provides group verbal and written  instruction on the benefits of flexibility and balance training programs. Provides general exercise guidelines with specific guidelines to those with heart or lung disease. Demonstration and skill practice provided.   Stress Management: - Provides group verbal and written instruction about the health risks of elevated stress, cause of high stress, and healthy ways to reduce stress.   Depression: - Provides group verbal and written instruction on the correlation between heart/lung disease and depressed mood, treatment options, and the stigmas associated with seeking treatment.   Exercise & Equipment Safety: - Individual verbal instruction and demonstration of equipment use and safety with use of the equipment.   Infection Prevention: - Provides verbal and written material to individual with discussion of infection control including proper hand washing and proper equipment cleaning during exercise session.  Falls Prevention: - Provides verbal and written material to individual with discussion of falls prevention and safety.   Diabetes: - Individual verbal and written instruction to review signs/symptoms of diabetes, desired ranges of glucose level fasting, after meals and with exercise. Advice that pre and post exercise glucose checks will be done for 3 sessions at entry of program.   Chronic Lung Diseases: - Group verbal and written instruction to review new updates, new respiratory medications, new advancements in procedures and treatments. Provide informative websites and "800" numbers of self-education.   Lung Procedures: - Group verbal and written instruction to describe testing methods done to diagnose lung disease. Review the outcome of test results. Describe the treatment choices: Pulmonary Function Tests, ABGs and oximetry.   Energy Conservation: - Provide group verbal and written instruction for methods to conserve energy, plan and organize activities. Instruct on pacing  techniques, use of adaptive equipment and posture/positioning to relieve shortness of breath.   Triggers: - Group verbal and written instruction to review types of environmental controls: home humidity, furnaces, filters, dust mite/pet prevention, HEPA vacuums. To discuss weather changes, air quality and the benefits of nasal washing.   Exacerbations: - Group verbal and written instruction to provide: warning signs, infection symptoms, calling MD promptly, preventive modes, and value of vaccinations. Review: effective airway clearance, coughing and/or vibration techniques. Create an Sports administrator.   Oxygen: - Individual and group verbal and written instruction on oxygen therapy. Includes supplement oxygen, available portable oxygen systems, continuous and intermittent flow rates, oxygen safety, concentrators, and Medicare reimbursement for oxygen.   Respiratory Medications: - Group verbal and written instruction to review medications for lung disease. Drug class, frequency, complications, importance of spacers, rinsing mouth after steroid MDI's, and proper cleaning methods for nebulizers.   AED/CPR: - Group verbal and written instruction with the use of models to demonstrate the basic use of the AED with the basic ABC's of resuscitation.   Breathing Retraining: - Provides individuals verbal and written instruction on purpose, frequency, and proper technique of diaphragmatic breathing and pursed-lipped breathing. Applies individual practice skills.   Anatomy and Physiology of the Lungs: - Group verbal and written instruction with the use of models to provide basic lung anatomy and physiology related to function, structure and complications of lung disease.   Heart Failure: - Group verbal and written instruction on the basics of heart failure: signs/symptoms, treatments, explanation of ejection fraction, enlarged heart and cardiomyopathy.   Sleep Apnea: - Individual verbal and written  instruction to review Obstructive Sleep Apnea. Review of risk factors, methods for diagnosing and types of masks and machines for OSA.   Anxiety: - Provides group, verbal and written instruction on the correlation between heart/lung disease and anxiety, treatment options, and management of anxiety.   Relaxation: - Provides group, verbal and written instruction about the benefits of relaxation for patients with heart/lung disease. Also provides patients with examples of relaxation techniques.   Knowledge Questionnaire Score:     Knowledge Questionnaire Score - 03/06/15 0900    Knowledge Questionnaire Score   Pre Score -3      Personal Goals and Risk Factors at Admission:     Personal Goals and Risk Factors at Admission - 03/06/15 0900    Personal Goals and Risk Factors on Admission    Weight Management Yes   Intervention Learn and follow the exercise and diet guidelines while in the program. Utilize the nutrition and education classes to help gain knowledge of the diet and exercise  expectations in the program   Increase Aerobic Exercise and Physical Activity Yes   Intervention While in program, learn and follow the exercise prescription taught. Start at a low level workload and increase workload after able to maintain previous level for 30 minutes. Increase time before increasing intensity.   Understand more about Heart/Pulmonary Disease. Yes   Intervention While in program utilize professionals for any questions, and attend the education sessions. Great websites to use are www.americanheart.org or www.lung.org for reliable information.   Improve shortness of breath with ADL's Yes   Intervention While in program, learn and follow the exercise prescription taught. Start at a low level workload and increase workload ad advised by the exercise physiologist. Increase time before increasing intensity.   Develop more efficient breathing techniques such as purse lipped breathing and  diaphragmatic breathing; and practicing self-pacing with activity Yes   Intervention While in program, learn and utilize the specific breathing techniques taught to you. Continue to practice and use the techniques as needed.   Increase knowledge of respiratory medications and ability to use respiratory devices properly.  Yes   Intervention While in program learn and demonstrate appropriate use of your oxygen therapy by increasing flow with exertion, manage oxygen tank operation, including continuous and intermittent flow.  Understanding oxygen is a drug ordered by your physician.   Hypertension Yes   Goal Participant will see blood pressure controlled within the values of 140/64m/Hg or within value directed by their physician.   Intervention Provide nutrition & aerobic exercise along with prescribed medications to achieve BP 140/90 or less.   Stress Yes   Goal To meet with psychosocial counselor for stress and relaxation information and guidance. To state understanding of performing relaxation techniques and or identifying personal stressors.   Intervention Provide education on types of stress, identifiying stressors, and ways to cope with stress. Provide demonstration and active practice of relaxation techniques.      Personal Goals and Risk Factors Review:    Personal Goals Discharge:    Comments: 30 day review

## 2015-03-26 NOTE — Progress Notes (Unsigned)
Daily Session Note  Patient Details  Name: Caitlin Leonard MRN: [id number] Date of Birth: 22-Jul-1946 Referring Provider:  Maryland Pink, MD  Encounter Date: 03/26/2015  Check In:     Session Check In - 03/26/15 1043    Check-In   Staff Present Carson Myrtle BS, RRT, Respiratory Therapist;Kelly Alfonso Patten, ACSM CEP Exercise Physiologist;Renee Dillard Essex MS, ACSM CEP Exercise Physiologist;Dyane Broberg RN, BSN   ER physicians immediately available to respond to emergencies [facility] immediately available ER MD   Physician(s) Dr Joni Fears and Dr. Corky Downs   Medication changes reported     No   Fall or balance concerns reported    No   Warm-up and Cool-down Performed on first and last piece of equipment   VAD Patient? No   Pain Assessment   Currently in Pain? No/denies         Goals Met:  Proper associated with RPD/PD & O2 Sat Exercise tolerated well Personal goals reviewed  Goals Unmet:  Not Applicable  Goals Comments:    Dr. Emily Filbert is Medical Director for [place] and [facility].

## 2015-03-28 ENCOUNTER — Encounter: Payer: Medicare PPO | Admitting: *Deleted

## 2015-03-28 DIAGNOSIS — J4520 Mild intermittent asthma, uncomplicated: Secondary | ICD-10-CM | POA: Diagnosis not present

## 2015-03-28 DIAGNOSIS — R053 Chronic cough: Secondary | ICD-10-CM

## 2015-03-28 DIAGNOSIS — R05 Cough: Secondary | ICD-10-CM

## 2015-03-28 NOTE — Progress Notes (Signed)
Daily Session Note  Patient Details  Name: LAVETA GILKEY MRN: [id number] Date of Birth: [birth year] Referring Provider:  Maryland Pink, MD  Encounter Date: 03/28/2015  Check In:     Session Check In - 03/28/15 1121    Check-In   Staff Present Candiss Norse MS, ACSM CEP Exercise Physiologist;Laureen Janell Quiet, RRT, Respiratory Therapist;Steven Way BS, ACSM EP-C, Exercise Physiologist   ER physicians immediately available to respond to emergencies [facility] immediately available ER MD   Physician(s) Thomasene Lot and Lord   Medication changes reported     No   Fall or balance concerns reported    No   Warm-up and Cool-down Performed on first and last piece of equipment   VAD Patient? No   Pain Assessment   Currently in Pain? No/denies   Multiple Pain Sites No         Goals Met:  Proper associated with RPD/PD & O2 Sat Independence with exercise equipment Using PLB without cueing & demonstrates good technique Exercise tolerated well Strength training completed today  Goals Unmet:  Not Applicable  Goals Comments:    Dr. Emily Filbert is Medical Director for [place] and [facility].

## 2015-03-28 NOTE — Progress Notes (Signed)
Daily Session Note  Patient Details  Name: Caitlin Leonard MRN: [id number] Date of Birth: [birth year] Referring Provider:  Maryland Pink, MD  Encounter Date: 03/28/2015  Check In:     Session Check In - 03/28/15 1121    Check-In   Staff Present Candiss Norse MS, ACSM CEP Exercise Physiologist;Laureen Janell Quiet, RRT, Respiratory Therapist;Steven Way BS, ACSM EP-C, Exercise Physiologist   ER physicians immediately available to respond to emergencies [facility] immediately available ER MD   Physician(s) Thomasene Lot and Lord   Medication changes reported     No   Fall or balance concerns reported    No   Warm-up and Cool-down Performed on first and last piece of equipment   VAD Patient? No   Pain Assessment   Currently in Pain? No/denies   Multiple Pain Sites No         Goals Met:  Proper associated with RPD/PD & O2 Sat Independence with exercise equipment Using PLB without cueing & demonstrates good technique Exercise tolerated well Strength training completed today  Goals Unmet:  Not Applicable  Goals Comments: [name] did not feel well today. Her blood pressure was high before exercise  - 140/80; she complained of dizziness; 2 cups of water was given to her and stated she felt better.  I talked to her about resting when she arrived home.   Dr. Emily Filbert is Medical Director for [place] and [facility].

## 2015-03-30 ENCOUNTER — Encounter: Payer: Medicare PPO | Admitting: *Deleted

## 2015-03-30 DIAGNOSIS — J4520 Mild intermittent asthma, uncomplicated: Secondary | ICD-10-CM | POA: Diagnosis not present

## 2015-03-30 DIAGNOSIS — R053 Chronic cough: Secondary | ICD-10-CM

## 2015-03-30 DIAGNOSIS — R05 Cough: Secondary | ICD-10-CM

## 2015-03-30 NOTE — Progress Notes (Signed)
Daily Session Note  Patient Details  Name: TIESHIA RETTINGER MRN: [id number] Date of Birth: 20-Jan-1946 Referring Provider:  Maryland Pink, MD  Encounter Date: 03/30/2015  Check In:     Session Check In - 03/30/15 1019    Check-In   Staff Present Heath Lark RN, BSN, CCRP;Stellan Vick Hannaford MS, ACSM CEP Exercise Physiologist;Stacey Blanch Media RRT, RCP Respiratory Therapist   ER physicians immediately available to respond to emergencies [facility] immediately available ER MD   Physician(s) Archie Balboa and Corky Downs   Medication changes reported     No   Fall or balance concerns reported    No   Warm-up and Cool-down Performed on first and last piece of equipment   VAD Patient? No   Pain Assessment   Currently in Pain? No/denies   Multiple Pain Sites No         Goals Met:  Independence with exercise equipment Using PLB without cueing & demonstrates good technique Exercise tolerated well Strength training completed today  Goals Unmet:  Not Applicable  Goals Comments:    Dr. Emily Filbert is Medical Director for [place] and [facility].

## 2015-04-02 ENCOUNTER — Encounter: Payer: Medicare PPO | Admitting: *Deleted

## 2015-04-02 DIAGNOSIS — R05 Cough: Secondary | ICD-10-CM

## 2015-04-02 DIAGNOSIS — J4520 Mild intermittent asthma, uncomplicated: Secondary | ICD-10-CM | POA: Diagnosis not present

## 2015-04-02 DIAGNOSIS — R053 Chronic cough: Secondary | ICD-10-CM

## 2015-04-02 NOTE — Progress Notes (Signed)
Daily Session Note  Patient Details  Name: Caitlin Leonard MRN: [id number] Date of Birth: 04/16/1946 Referring Provider:  Maryland Pink, MD  Encounter Date: 04/02/2015  Check In:     Session Check In - 04/02/15 1027    Check-In   Staff Present Laureen Owens Shark BS, RRT, Respiratory Therapist;Gracia Saggese Alfonso Patten, ACSM CEP Exercise Physiologist;Steven Way BS, ACSM EP-C, Exercise Physiologist   ER physicians immediately available to respond to emergencies [facility] immediately available ER MD   Physician(s) Dr. Edd Fabian and Dr. Reita Cliche   Medication changes reported     No   Fall or balance concerns reported    No   Warm-up and Cool-down Performed on first and last piece of equipment   VAD Patient? No   Pain Assessment   Currently in Pain? No/denies           Exercise Prescription Changes - 04/02/15 1000    Exercise Review   Progression Yes   Response to Exercise   Duration Progress to 30 minutes of continuous aerobic without signs/symptoms of physical distress   Progression Continue progressive overload as per policy without signs/symptoms or physical distress.   Resistance Training   Training Prescription Yes   Weight 2   Reps 10-15   Interval Training   Interval Training No   NuStep   Level 2   Watts 40   Minutes 15   Arm Ergometer   Level 2   Watts 10   Minutes 10   REL-XR   Level 2   Watts 30   Minutes 15      Goals Met:  Proper associated with RPD/PD & O2 Sat Independence with exercise equipment Exercise tolerated well Personal goals reviewed Strength training completed today  Goals Unmet:  Not Applicable  Goals Comments:    Dr. Emily Filbert is Medical Director for [place] and [facility].

## 2015-04-02 NOTE — Progress Notes (Signed)
Daily Session Note  Patient Details  Name: Caitlin Leonard MRN: [id number] Date of Birth: Feb 09, 1946 Referring Provider:  Maryland Pink, MD  Encounter Date: 04/02/2015  Check In:     Session Check In - 04/02/15 1027    Check-In   Staff Present Laureen Owens Shark BS, RRT, Respiratory Therapist;Kelly Alfonso Patten, ACSM CEP Exercise Physiologist;Steven Way BS, ACSM EP-C, Exercise Physiologist   ER physicians immediately available to respond to emergencies [facility] immediately available ER MD   Physician(s) Dr. Edd Fabian and Dr. Reita Cliche   Medication changes reported     No   Fall or balance concerns reported    No   Warm-up and Cool-down Performed on first and last piece of equipment   VAD Patient? No   Pain Assessment   Currently in Pain? No/denies           Exercise Prescription Changes - 04/02/15 1000    Exercise Review   Progression Yes   Response to Exercise   Duration Progress to 30 minutes of continuous aerobic without signs/symptoms of physical distress   Progression Continue progressive overload as per policy without signs/symptoms or physical distress.   Resistance Training   Training Prescription Yes   Weight 2   Reps 10-15   Interval Training   Interval Training No   NuStep   Level 2   Watts 40   Minutes 15   Arm Ergometer   Level 2   Watts 10   Minutes 10   REL-XR   Level 2   Watts 30   Minutes 15      Goals Met:  Proper associated with RPD/PD & O2 Sat Using PLB without cueing & demonstrates good technique Exercise tolerated well Strength training completed today  Goals Unmet:  Not Applicable  Goals Comments: Ms Westerlund questioned the height of her cane. We asked Caryl Pina, PT, if it was at the right height. She asked Ms Abeln to hang her arm at her side - the top of the cane should come to her wrist which it did, so in the right position.   Dr. Emily Filbert is Medical Director for [place] and [facility].

## 2015-04-04 ENCOUNTER — Encounter: Payer: Medicare PPO | Admitting: *Deleted

## 2015-04-04 DIAGNOSIS — R053 Chronic cough: Secondary | ICD-10-CM

## 2015-04-04 DIAGNOSIS — J4520 Mild intermittent asthma, uncomplicated: Secondary | ICD-10-CM | POA: Diagnosis not present

## 2015-04-04 DIAGNOSIS — R05 Cough: Secondary | ICD-10-CM

## 2015-04-04 NOTE — Progress Notes (Signed)
Daily Session Note  Patient Details  Name: JALINE PINCOCK MRN: [id number] Date of Birth: 06-Dec-1945 Referring Provider:  Maryland Pink, MD  Encounter Date: 04/04/2015  Check In:     Session Check In - 04/04/15 1039    Check-In   Staff Present Laureen Owens Shark BS, RRT, Respiratory Therapist;Inita Uram Dillard Essex MS, ACSM CEP Exercise Physiologist;Steven Way BS, ACSM EP-C, Exercise Physiologist   ER physicians immediately available to respond to emergencies [facility] immediately available ER MD   Physician(s) Archie Balboa and Joni Fears   Medication changes reported     No   Fall or balance concerns reported    No   Warm-up and Cool-down Performed on first and last piece of equipment   VAD Patient? No   Pain Assessment   Currently in Pain? No/denies   Multiple Pain Sites No         Goals Met:  Proper associated with RPD/PD & O2 Sat Independence with exercise equipment Using PLB without cueing & demonstrates good technique Exercise tolerated well Strength training completed today  Goals Unmet:  Not Applicable  Goals Comments:    Dr. Emily Filbert is Medical Director for [place] and [facility].

## 2015-04-06 ENCOUNTER — Encounter: Payer: Medicare PPO | Attending: Cardiovascular Disease | Admitting: *Deleted

## 2015-04-06 DIAGNOSIS — R05 Cough: Secondary | ICD-10-CM

## 2015-04-06 DIAGNOSIS — J4520 Mild intermittent asthma, uncomplicated: Secondary | ICD-10-CM | POA: Insufficient documentation

## 2015-04-06 DIAGNOSIS — R053 Chronic cough: Secondary | ICD-10-CM

## 2015-04-06 NOTE — Progress Notes (Signed)
Daily Session Note  Patient Details  Name: Caitlin Leonard MRN: [id number] Date of Birth: 12-19-1945 Referring Provider:  Minna Merritts, MD  Encounter Date: 04/06/2015  Check In:     Session Check In - 04/06/15 1025    Check-In   Staff Present Heath Lark RN, BSN, CCRP;Renee Dillard Essex MS, ACSM CEP Exercise Physiologist;Stacey Blanch Media RRT, RCP Respiratory Therapist   Physician(s) Dr. Thomasene Lot, and Dr. Claudette Head   Medication changes reported     No   Fall or balance concerns reported    No   Warm-up and Cool-down Performed on first and last piece of equipment   Pain Assessment   Currently in Pain? No/denies           Exercise Prescription Changes - 04/06/15 1000    Exercise Review   Progression Yes   Response to Exercise   Duration Progress to 30 minutes of continuous aerobic without signs/symptoms of physical distress   Progression Continue progressive overload as per policy without signs/symptoms or physical distress.   Resistance Training   Training Prescription Yes   Weight 2   Reps 10-15   Interval Training   Interval Training No   NuStep   Level 2   Watts 40   Minutes 15   Arm Ergometer   Level 2   Watts 10   Minutes 10   REL-XR   Level 3   Watts 30   Minutes 15      Goals Met:  Proper associated with RPD/PD & O2 Sat Independence with exercise equipment Exercise tolerated well Personal goals reviewed  Goals Unmet:  Not Applicable  Goals Comments:    Dr. Emily Filbert is Medical Director for [place] and [facility].

## 2015-04-10 ENCOUNTER — Ambulatory Visit: Payer: Medicare PPO | Attending: Internal Medicine

## 2015-04-10 DIAGNOSIS — J383 Other diseases of vocal cords: Secondary | ICD-10-CM | POA: Diagnosis not present

## 2015-04-10 NOTE — Patient Instructions (Signed)
Think about the relaxation strategies we talked about today and try to implement them. Grapefruit (widening of vocal cord space), melting away or evaporating away of any tightness/tickle feeling, and the picture of yourself rocking on the porch at the homestead in [place].  We will begin to [facility] on abdominal breathing when you return for therapy.   Consider another trial of the Gabapentin, soon, without the added stress you've had the last couple of months.

## 2015-04-10 NOTE — Therapy (Addendum)
[facility] [address] [place], [place], [address] Phone: [phone]   Fax:  [phone]  Speech Language Pathology Evaluation  Patient Details  Name: Caitlin Leonard MRN: [id number] Date of Birth: 23-Jul-1946 Referring Provider:  Brand Males, MD  Encounter Date: 04/10/2015      End of Session - 04/10/15 1123    Visit Number 1   Number of Visits 16   Date for SLP Re-Evaluation 06/10/15   SLP Start Time 68   SLP Stop Time  1104   SLP Time Calculation (min) 44 min   Activity Tolerance Other (comment)  pt's verbalization limited somewhat by cough frequency      Past Medical History  Diagnosis Date  . Hypertension     on medication x 10 years  . Heart murmur     MVP ( symptomatic)  . Dysrhythmia   . Asthma     due to seasonal alllergies  . Sleep apnea 2007    does not use CPAP since 40 # wt loss  . GERD (gastroesophageal reflux disease)   . History of IBS   . Collagenous colitis 2010  . Headache(784.0)     migraines; 2 x month  . Arthritis     osteoarthritis  . Anxiety   . Depression     situational  . Hyperlipemia   . Syncopal episodes   . Cataract   . Shortness of breath     exertional    Past Surgical History  Procedure Laterality Date  . Cardiac catheterization  2012    [facility]  . Lumbar laminectomy  [id number]  . Back surgery    . Tonsillectomy    . Knee arthroscopy  1985, 1990, 2012 x 2    bilateral  . Cholecystectomy  2011  . Nasal sinus surgery  1994  . Abdominal hysterectomy  1979  . Total knee arthroplasty  12/22/2011    Procedure: TOTAL KNEE ARTHROPLASTY;  Surgeon: Rudean Haskell, MD;  Location: [facility];  Service: Orthopedics;  Laterality: Left;    There were no vitals filed for this visit.  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 She stopped  taking gabapentin due to feeling it did not help the cough.            SLP Evaluation [facility] - 04/10/15 1035    SLP Visit Information   SLP Received On 04/10/15   Onset Date Christmas 2015   Medical Diagnosis cyclical cough   Pain Assessment   Currently in Pain? No/denies   General Information   Other Pertinent Information Pt with coughing with floral perfumes and tobacco smoke prior to Christmas 2015.  Pt has seen a psychologist for the past several months who deals wtih her stress.   Mobility Status single point cane   Prior Functional Status   Cognitive/Linguistic Baseline Within functional limits    Lives With Spouse   Vocation Retired   Associate Professor   Overall Cognitive Status Within Functional Limits for tasks assessed   Auditory Comprehension   Overall Auditory Comprehension Appears within functional limits for tasks assessed   Verbal Expression   Overall Verbal Expression Appears within functional limits for tasks assessed   Oral Motor/Sensory Function   Overall Oral Motor/Sensory Function Appears within functional limits for tasks assessed   Motor Speech   Overall Motor Speech Impaired  Respiration Impaired  vocal fold closure during inhalation (stacatto inhalation)   Level of Impairment Phrase   Phonation Hoarse;Other (comment)  pt coughed 38 times in this approx 40-minute evaluation   Interfering Components --  chest breather - no abdominal breathing noted during eval     Pt and SLP discussed handout of vocal cord dysfunction (VCD) in order to educate pt about VCD and for SLP to assess triggers/causes/signs and symptoms of VCD. Pt presents with mild hoarse voice. Pt's symptoms of VCD include feeling of tightness in chest, frequent cough or throat clearing, hoarse voice, and noisy breathing (wheezing). Causes were ID'd as strong odors or smells, voice usage, post-nasal drip, and strong emotions and stress.  SLP and pt discussed relaxation techniques for pt to think  about when VCD episodes occur including mental image of relaxation (swinging on the porch at the homestead in [place]), feeling tension/tightness in throat melt away or evaporate, and imagining the throat expanding.  Pt was educated about abdominal breathing, and how it is essential in inhibiting/eliminating symptoms of VCD. SLP assessed pt to be a chest breather. The pt will benefit from skilled ST addressing abdominal breathing (AB), and relaxation strategies, all to eliminate/reduce VCD and thus increase pt's QOL.         SLP Education - 04/10/15 1121    Education provided Yes   Education Details Causes/symptoms of vocal cord dysfunction (VCD), treatment plan for VCD, relaxation strategies   Person(s) Educated Patient   Methods Explanation;Verbal cues;Handout   Comprehension Verbalized understanding;Need further instruction          SLP Short Term Goals - 04/10/15 1133    SLP SHORT TERM GOAL #1   Title frequency of cough decr by 15%   Time 4   Period Weeks   Status New   SLP SHORT TERM GOAL #2   Title pt will demo abdominal breathing at rest 50% of the time   Time 4   Period Weeks   Status New   SLP SHORT TERM GOAL #3   Title pt will respond in sentences using abdominal breathing 30% of the time   Time 4   Period Weeks   Status New   SLP SHORT TERM GOAL #4   Title pt will attempt one relaxation strategy outside ST    Time 4   Period Weeks   Status New   SLP SHORT TERM GOAL #5   Title pt will present to SLP evidence of practicing abdominal breathing at rest (practice log) during 4 sessions   Time 4   Period Weeks   Status New          SLP Long Term Goals - 04/10/15 1137    SLP LONG TERM GOAL #1   Title pt will present evidence of practicing abdominal breathing (via practice log) over three sessions    Time 8   Period Weeks   Status New   SLP LONG TERM GOAL #2   Title pt will report decr in cough frequency by 50%   Time 8   Period Weeks   Status New    SLP LONG TERM GOAL #3   Title pt will report spontaneous attempting of relaxation strategies outside clinic during episodes   Time 8   Period Weeks   Status New   SLP LONG TERM GOAL #4   Title pt will demo abdominal breathing in sentence responses 75% of the time   Time 8   Status New   SLP  LONG TERM GOAL #5   Title pt will demo abdominal breathing in 5 minutes simple conversation, 50% of the time   Time 8   Period Weeks   Status New          Plan - 04/10/15 1130    Clinical Impression Statement Pt presents today with classic signs and symptoms of vocal cord 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 Skilled ST is needed.     G-Codes ST Voice - Current [phone]) CJ (20-39% impaired) ST Voice - Goal (G9172) CI (1-20% impaired)   Problem List Patient Active Problem List   Diagnosis Date Noted  . OSA (obstructive sleep apnea) 10/18/2014  . Nodule of right lung 06/14/2014  . Dyspnea 06/13/2014  . Coronary artery calcification 04/17/2014  . Aneurysm, ascending aorta 04/17/2014  . Chronic cough 02/24/2014  . Mold suspected exposure 02/24/2014    [facility] , [place], [place]  04/10/2015, 11:43 AM  [facility] [address] [place], [place], [address] Phone: [phone]   Fax:  [phone]

## 2015-04-11 ENCOUNTER — Ambulatory Visit (INDEPENDENT_AMBULATORY_CARE_PROVIDER_SITE_OTHER): Payer: Medicare PPO | Admitting: Adult Health

## 2015-04-11 ENCOUNTER — Encounter: Payer: Self-pay | Admitting: Adult Health

## 2015-04-11 VITALS — BP 130/90 | HR 71 | Temp 97.6°F | Ht 68.0 in | Wt 254.0 lb

## 2015-04-11 DIAGNOSIS — R053 Chronic cough: Secondary | ICD-10-CM

## 2015-04-11 DIAGNOSIS — J4520 Mild intermittent asthma, uncomplicated: Secondary | ICD-10-CM | POA: Diagnosis not present

## 2015-04-11 DIAGNOSIS — J209 Acute bronchitis, unspecified: Secondary | ICD-10-CM | POA: Diagnosis not present

## 2015-04-11 DIAGNOSIS — J45901 Unspecified asthma with (acute) exacerbation: Secondary | ICD-10-CM

## 2015-04-11 DIAGNOSIS — R05 Cough: Secondary | ICD-10-CM | POA: Diagnosis not present

## 2015-04-11 MED ORDER — PROMETHAZINE-CODEINE 6.25-10 MG/5ML PO SYRP: 5.0000 mL | ORAL_SOLUTION | Freq: Four times a day (QID) | ORAL | Status: DC | PRN

## 2015-04-11 MED ORDER — AZITHROMYCIN 250 MG PO TABS: ORAL_TABLET | ORAL | Status: AC

## 2015-04-11 NOTE — Progress Notes (Signed)
   Subjective:    Patient ID: Caitlin Leonard, female    DOB: Feb 16, 1946, 69 y.o.   MRN: [id number]  HPI 69 yo female never smoker followed for cyclical cough (since 2878)   04/11/2015 Follow up  Pt returns for 6 week follow up for chronic cough.  Says cough is unchanged . Does complain that she now has yellow mucus x 3 weeks.  Chest congestion/tightness, SOB with activity.  Sinus drainage and wheezing at bedtime.  Not using any cough meds for cough .  Started voice therapy yesterday.  Remains on Advair . And uses albuterol Twice daily .  Last ov  tried on neurontin but did not help and she did not like this .  Restarted lyrica.  Recent CT chest with stable pulmonary nodules c/w benign process. No acute process.  She denies chest pain, orthopnea,, increased edema , fever or hemoptysis .  Has seen by allergist and pulmonary in [place] prior to consult with Dr. Chase Caller  Seen by ENT in [facility] for chronic sinus dz On Singulair, Xyzal , Astelin. On Prilosec daily  Hx of Psoriatic arthritis.  Prev CT chest with no ILD noted.  PFT prev normal.   Review of Systems Constitutional:   No  weight loss, night sweats,  Fevers, chills, fatigue, or  lassitude.  HEENT:   No headaches,  Difficulty swallowing,  Tooth/dental problems, or  Sore throat,                No sneezing, itching, ear ache, + nasal congestion, post nasal drip,   CV:  No chest pain,  Orthopnea, PND, swelling in lower extremities, anasarca, dizziness, palpitations, syncope.   GI  No heartburn, indigestion, abdominal pain, nausea, vomiting, diarrhea, change in bowel habits, loss of appetite, bloody stools.   Resp:    No chest wall deformity  Skin: no rash or lesions.  GU: no dysuria, change in color of urine, no urgency or frequency.  No flank pain, no hematuria   MS:  No joint pain or swelling.  No decreased range of motion.  No back pain.  Psych:  No change in mood or affect. No depression or anxiety.  No memory  loss.         Objective:   Physical Exam GEN: A/Ox3; pleasant , NAD, well nourished   HEENT:  [place]/AT,  EACs-clear, TMs-wnl, NOSE-clear, THROAT-clear, no lesions, no postnasal drip or exudate noted.   NECK:  Supple w/ fair ROM; no JVD; normal carotid impulses w/o bruits; no thyromegaly or nodules palpated; no lymphadenopathy.  RESP  Clear  P & A; w/o, wheezes/ rales/ or rhonchi.no accessory muscle use, no dullness to percussion  CARD:  RRR, no m/r/g  , no peripheral edema, pulses intact, no cyanosis or clubbing.  GI:   Soft & nt; nml bowel sounds; no organomegaly or masses detected.  Musco: Warm bil, no deformities or joint swelling noted.   Neuro: alert, no focal deficits noted.    Skin: Warm, no lesions or rashes     Assessment & Plan:

## 2015-04-11 NOTE — Assessment & Plan Note (Signed)
Fl[place]   Plan  Zpack take as directed.  Mucinex Twice daily  As needed  Cough/congestioin  Delsym 2 tsp Twice daily  As needed  Cough .  Add Pepcid 20mg  At bedtime   Continue with voice rehab.  follow up Dr. Chase Caller in 2-3 months and As needed

## 2015-04-11 NOTE — Patient Instructions (Addendum)
Zpack take as directed.  Mucinex Twice daily  As needed  Cough/congestioin  Delsym 2 tsp Twice daily  As needed  Cough .  Add Pepcid 20mg  At bedtime   Continue with voice rehab.  follow up Dr. Chase Caller in 2-3 months and As needed

## 2015-04-11 NOTE — Progress Notes (Signed)
Daily Session Note  Patient Details  Name: Caitlin Leonard MRN: [id number] Date of Birth: Mar 20, 1946 Referring Provider:  Mary[place] Pink, MD  Encounter Date: 04/11/2015  Check In:     Session Check In - 04/11/15 1109    Check-In   Staff Present Lestine Box BS, ACSM EP-C, Exercise Physiologist;Renee Dillard Essex MS, ACSM CEP Exercise Physiologist;Laureen Janell Quiet, RRT, Respiratory Therapist   ER physicians immediately available to respond to emergencies [facility] immediately available ER MD   Physician(s) Joni Fears and Thomasene Lot   Medication changes reported     No   Fall or balance concerns reported    No   Warm-up and Cool-down Performed on first and last piece of equipment   VAD Patient? No   Pain Assessment   Currently in Pain? No/denies         Goals Met:  Proper associated with RPD/PD & O2 Sat Exercise tolerated well No report of cardiac concerns or symptoms Strength training completed today  Goals Unmet:  Not Applicable  Goals Comments:    Dr. Emily Filbert is Medical Director for [place] and [facility].

## 2015-04-11 NOTE — Assessment & Plan Note (Signed)
Cyclical cough with extensive workup  Felt with AR and GERD triggers.  Cont w/ cough control , add delsym  Add Pepcid for GERD prevention  Cont w/ voice therapy   Plan  Zpack take as directed.  Mucinex Twice daily  As needed  Cough/congestioin  Delsym 2 tsp Twice daily  As needed  Cough .  Add Pepcid 20mg  At bedtime   Continue with voice rehab.  follow up Dr. Chase Caller in 2-3 months and As needed

## 2015-04-12 ENCOUNTER — Ambulatory Visit: Payer: Medicare PPO | Admitting: Speech Pathology

## 2015-04-12 DIAGNOSIS — J383 Other diseases of vocal cords: Secondary | ICD-10-CM | POA: Diagnosis not present

## 2015-04-12 NOTE — Patient Instructions (Addendum)
   ABDOMINAL BREATHING  . Place your hand on your belly . Breathe in through your nose and fill your belly with air, watching your hand move outward . Breathe out through your mouth and watch your belly move in. An audible "sh" may help    Think of your belly as a balloon, when you fill with air, the balloon gets bigger. As the air goes out, the balloon deflates.  If you are having difficulty coordinating this, lay on your back with a plastic cup on your belly and repeat the above steps, watching you belly move up with inhalation and down with exhalations  Practice breathing in and out in front of a mirror, watching your belly Breathe in  for a count of 5 and breathe out for a count of 5  Now as you breathe out, get a picture of relaxing in your mind Feel the constant in-out of your breathing with your belly Picture the tension in your throat and chest evaporate like steam, melting away and FEEL it do so Picture your throat opening up so wide that a grapefruit or softball could fit through your throat.  There's an App for that: Breathe2relax  Provided by: Mickel Baas L. ST [phone]

## 2015-04-12 NOTE — Therapy (Signed)
[facility] [address] [place], [place], [address] Phone: [phone]   Fax:  [phone]  Speech Language Pathology Treatment  Patient Details  Name: Caitlin Leonard MRN: [id number] Date of Birth: 07-29-1946 Referring Provider:  Maryland Pink, MD  Encounter Date: 04/12/2015      End of Session - 04/12/15 1139    Visit Number 2   Number of Visits 16   Date for SLP Re-Evaluation 06/10/15   SLP Start Time 66   SLP Stop Time  1139   SLP Time Calculation (min) 39 min   Activity Tolerance Patient tolerated treatment well      Past Medical History  Diagnosis Date  . Hypertension     on medication x 10 years  . Heart murmur     MVP ( symptomatic)  . Dysrhythmia   . Asthma     due to seasonal alllergies  . Sleep apnea 2007    does not use CPAP since 40 # wt loss  . GERD (gastroesophageal reflux disease)   . History of IBS   . Collagenous colitis 2010  . Headache(784.0)     migraines; 2 x month  . Arthritis     osteoarthritis  . Anxiety   . Depression     situational  . Hyperlipemia   . Syncopal episodes   . Cataract   . Shortness of breath     exertional    Past Surgical History  Procedure Laterality Date  . Cardiac catheterization  2012    [facility]  . Lumbar laminectomy  [id number]  . Back surgery    . Tonsillectomy    . Knee arthroscopy  1985, 1990, 2012 x 2    bilateral  . Cholecystectomy  2011  . Nasal sinus surgery  1994  . Abdominal hysterectomy  1979  . Total knee arthroplasty  12/22/2011    Procedure: TOTAL KNEE ARTHROPLASTY;  Surgeon: Rudean Haskell, MD;  Location: [facility];  Service: Orthopedics;  Laterality: Left;    There were no vitals filed for this visit.  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 No/denies               ADULT SLP TREATMENT - 04/12/15  1107    General Information   Behavior/Cognition Alert;Cooperative;Pleasant mood   Treatment Provided   Treatment provided Cognitive-Linquistic   Pain Assessment   Pain Assessment No/denies pain   Cognitive-Linquistic Treatment   Treatment focused on Voice   Skilled Treatment Initiated training in abdmonimal breathing in isolatoin with usual mod verbal instrcutions and modeling. Pt improved to requireing usual min cues.  Initiated relaxation with usual mod A. Pt observed to have short cough consistently after laughing.    Assessment / Recommendations / Plan   Plan Continue with current plan of care   Progression Toward Goals   Progression toward goals Progressing toward goals          SLP Education - 04/12/15 1135    Education provided Yes   Education Details VCD triggers, relaxation strategies, rationale for abdominal breathing with VCD   Person(s) Educated Patient   Methods Explanation;Demonstration;Handout   Comprehension Verbalized understanding;Need further instruction          SLP Short Term Goals - 04/12/15 1138    SLP SHORT TERM GOAL #1   Title frequency of cough decr  by 15%   Time 4   Period Weeks   Status On-going   SLP SHORT TERM GOAL #2   Title pt will demo abdominal breathing at rest 50% of the time   Time 4   Period Weeks   Status On-going   SLP SHORT TERM GOAL #3   Title pt will respond in sentences using abdominal breathing 30% of the time   Time 4   Period Weeks   Status On-going   SLP SHORT TERM GOAL #4   Title pt will attempt one relaxation strategy outside ST    Time 4   Period Weeks   Status New   SLP SHORT TERM GOAL #5   Title pt will present to SLP evidence of practicing abdominal breathing at rest (practice log) during 4 sessions   Time 4   Period Weeks   Status New          SLP Long Term Goals - 04/12/15 1139    SLP LONG TERM GOAL #1   Title pt will present evidence of practicing abdominal breathing (via practice log) over three  sessions    Time 8   Period Weeks   Status On-going   SLP LONG TERM GOAL #2   Title pt will report decr in cough frequency by 50%   Time 8   Period Weeks   Status On-going   SLP LONG TERM GOAL #3   Title pt will report spontaneous attempting of relaxation strategies outside clinic during episodes   Time 8   Period Weeks   Status On-going   SLP LONG TERM GOAL #4   Title pt will demo abdominal breathing in sentence responses 75% of the time   Time 8   Status On-going   SLP LONG TERM GOAL #5   Title pt will demo abdominal breathing in 5 minutes simple conversation, 50% of the time   Time 8   Period Weeks   Status New          Plan - 04/12/15 1136    Clinical Impression Statement Pt required usual min to mod A for abdmominal breathingn in isolatoin with stactto respirations 50% of trials. Coughing episodes noted consistently after laughing. Continue skilled ST to reduce frequency/duration of VCD episodes.   Speech Therapy Frequency 2x / week   Duration --  8 weeks   Treatment/Interventions Functional tasks;Patient/family education;Compensatory strategies;SLP instruction and feedback;Internal/external aids   Potential to Achieve Goals Good   SLP Home Exercise Plan Abdominal breathing   Consulted and Agree with Plan of Care Patient        Problem List Patient Active Problem List   Diagnosis Date Noted  . Bronchitis, acute 04/11/2015  . OSA (obstructive sleep apnea) 10/18/2014  . Nodule of right lung 06/14/2014  . Dyspnea 06/13/2014  . Coronary artery calcification 04/17/2014  . Aneurysm, ascending aorta 04/17/2014  . Chronic cough 02/24/2014  . Mold suspected exposure 02/24/2014    Myeasha Ballowe, Annye Rusk MS, CCC-SLP 04/12/2015, 11:40 AM  [facility] [address] [place], [place], [address] Phone: [phone]   Fax:  [phone]

## 2015-04-13 ENCOUNTER — Encounter: Payer: Medicare PPO | Admitting: *Deleted

## 2015-04-13 DIAGNOSIS — J4520 Mild intermittent asthma, uncomplicated: Secondary | ICD-10-CM | POA: Diagnosis not present

## 2015-04-13 NOTE — Progress Notes (Signed)
Daily Session Note  Patient Details  Name: Caitlin Leonard MRN: [id number] Date of Birth: 03/15/1946 Referring Provider:  Hedrick, James, MD  Encounter Date: 04/13/2015  Check In:     Session Check In - 04/13/15 1117    Check-In   Staff Present Stacey Joyce RRT, RCP Respiratory Therapist;Diane Wright RN, BSN;Renee MacMillan MS, ACSM CEP Exercise Physiologist   ER physicians immediately available to respond to emergencies [facility] immediately available ER MD   Physician(s) Paduchowski, Lord   Medication changes reported     No   Fall or balance concerns reported    No   Warm-up and Cool-down Performed on first and last piece of equipment   VAD Patient? No   Pain Assessment   Currently in Pain? No/denies   Multiple Pain Sites No           Exercise Prescription Changes - 04/13/15 1100    Response to Exercise   Duration Progress to 30 minutes of continuous aerobic without signs/symptoms of physical distress   Progression Continue progressive overload as per policy without signs/symptoms or physical distress.   NuStep   Level 2  BIOSTEP INSTEAD OF NS   Watts 15   Minutes 15   Arm Ergometer   Level 2   Watts 15   Minutes 10      Goals Met:  Proper associated with RPD/PD & O2 Sat Using PLB without cueing & demonstrates good technique Exercise tolerated well Personal goals reviewed  Goals Unmet:  Not Applicable  Goals Comments:    Dr. Mark Miller is Medical Director for [facility] and [facility]. 

## 2015-04-16 ENCOUNTER — Encounter: Payer: Medicare PPO | Admitting: *Deleted

## 2015-04-16 DIAGNOSIS — R053 Chronic cough: Secondary | ICD-10-CM

## 2015-04-16 DIAGNOSIS — J45901 Unspecified asthma with (acute) exacerbation: Secondary | ICD-10-CM

## 2015-04-16 DIAGNOSIS — J4520 Mild intermittent asthma, uncomplicated: Secondary | ICD-10-CM | POA: Diagnosis not present

## 2015-04-16 DIAGNOSIS — R05 Cough: Secondary | ICD-10-CM

## 2015-04-16 NOTE — Progress Notes (Signed)
Daily Session Note  Patient Details  Name: Caitlin Leonard MRN: [id number] Date of Birth: April 04, 1946 Referring Provider:  Maryland Pink, MD  Encounter Date: 04/16/2015  Check In:     Session Check In - 04/16/15 1027    Check-In   Staff Present Laureen Owens Shark BS, RRT, Respiratory Therapist;Lilliann Rossetti Alfonso Patten, ACSM CEP Exercise Physiologist;Steven Way BS, ACSM EP-C, Exercise Physiologist   ER physicians immediately available to respond to emergencies [facility] immediately available ER MD   Physician(s) Dr. Archie Balboa and Dr. Jimmye Norman   Medication changes reported     No   Fall or balance concerns reported    No   Warm-up and Cool-down Performed on first and last piece of equipment   VAD Patient? No   Pain Assessment   Currently in Pain? No/denies   Multiple Pain Sites No           Exercise Prescription Changes - 04/16/15 1000    Response to Exercise   Duration Progress to 30 minutes of continuous aerobic without signs/symptoms of physical distress   Progression Continue progressive overload as per policy without signs/symptoms or physical distress.   Resistance Training   Training Prescription Yes   Weight 2   Reps 10-15   Interval Training   Interval Training No   NuStep   Level 2  BIOSTEP INSTEAD OF NS   Watts 30   Minutes 15   Arm Ergometer   Level 3   Watts 15   Minutes 10   REL-XR   Level 3   Watts 70   Minutes 15      Goals Met:  Proper associated with RPD/PD & O2 Sat Independence with exercise equipment Exercise tolerated well Personal goals reviewed Strength training completed today  Goals Unmet:  Not Applicable  Goals Comments:    Dr. Emily Filbert is Medical Director for [facility] and [facility].

## 2015-04-17 ENCOUNTER — Ambulatory Visit: Payer: Medicare PPO

## 2015-04-17 DIAGNOSIS — J383 Other diseases of vocal cords: Secondary | ICD-10-CM | POA: Diagnosis not present

## 2015-04-17 NOTE — Therapy (Signed)
[facility] [address] [place], [place], [address] Phone: [phone]   Fax:  [phone]  Speech Language Pathology Treatment  Patient Details  Name: Caitlin Leonard MRN: [id number] Date of Birth: 10-14-1945 Referring Provider:  Maryland Pink, MD  Encounter Date: 04/17/2015      End of Session - 04/17/15 1116    Visit Number 3   Number of Visits 16   Date for SLP Re-Evaluation 06/10/15   SLP Start Time 87   SLP Stop Time  1103   SLP Time Calculation (min) 43 min   Activity Tolerance Patient tolerated treatment well      Past Medical History  Diagnosis Date  . Hypertension     on medication x 10 years  . Heart murmur     MVP ( symptomatic)  . Dysrhythmia   . Asthma     due to seasonal alllergies  . Sleep apnea 2007    does not use CPAP since 40 # wt loss  . GERD (gastroesophageal reflux disease)   . History of IBS   . Collagenous colitis 2010  . Headache(784.0)     migraines; 2 x month  . Arthritis     osteoarthritis  . Anxiety   . Depression     situational  . Hyperlipemia   . Syncopal episodes   . Cataract   . Shortness of breath     exertional    Past Surgical History  Procedure Laterality Date  . Cardiac catheterization  2012    [facility]  . Lumbar laminectomy  [id number]  . Back surgery    . Tonsillectomy    . Knee arthroscopy  1985, 1990, 2012 x 2    bilateral  . Cholecystectomy  2011  . Nasal sinus surgery  1994  . Abdominal hysterectomy  1979  . Total knee arthroplasty  12/22/2011    Procedure: TOTAL KNEE ARTHROPLASTY;  Surgeon: Rudean Haskell, MD;  Location: [facility];  Service: Orthopedics;  Laterality: Left;    There were no vitals filed for this visit.  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               ADULT SLP TREATMENT - 04/17/15  1025    General Information   Behavior/Cognition Alert;Cooperative;Pleasant mood   Treatment Provided   Treatment provided Cognitive-Linquistic   Pain Assessment   Pain Assessment No/denies pain   Cognitive-Linquistic Treatment   Treatment focused on Voice   Skilled Treatment Pt stated she has practiced the breathing "15 minutes once a day or something" since last ST visit. She is most successful with abdominal breathing (AB) when lying down but has also seen success with AB while sitting. In SLP-guided practice of AB today, pt with noted vocal spasm for first 4 minutes, then subsided for approx 2 minutes until pt used voice again ("mmm-hmmm") then back to frequent audible vocal spasm. Pt admitted she was "fighting off coughing" during those times but that severity of desire to cough subsided during that practice time of approx 7 minutes but more acute cough feeling again at the end. SLP explained rationale for AB again and stressed to pt she had decr in audible vocal spasm while practicing AB. In another 6 1/2 minute practice period pt coughed once. and exhibted less vocal spasm after 5 minutes. SLP filled  H2O glass for pt and suggested taking sip/swallow when cough feeling occurred. She sipped and swallowed 2/4 single-cough episodes (once following SLP verbal cue to do so). Pt relates to SLP that she is going many places rightn now in addition to taking care of her mother's affairs due to decreasing health - "I'm stressed," she said. SLP suggested she may want to take a breatk from [place] until external stressors decrease.    Assessment / Recommendations / Plan   Plan Continue with current plan of care   Progression Toward Goals   Progression toward goals Progressing toward goals            SLP Short Term Goals - 04/17/15 1126    SLP SHORT TERM GOAL #1   Title frequency of cough decr by 15%   Time 3   Period Weeks   Status On-going   SLP SHORT TERM GOAL #2   Title pt will demo abdominal  breathing at rest 50% of the time   Time 3   Period Weeks   Status On-going   SLP SHORT TERM GOAL #3   Title pt will respond in sentences using abdominal breathing 30% of the time   Time 3   Period Weeks   Status On-going   SLP SHORT TERM GOAL #4   Title pt will attempt one relaxation strategy outside ST    Time 3   Period Weeks   Status New   SLP SHORT TERM GOAL #5   Title pt will present to SLP evidence of practicing abdominal breathing at rest (practice log) during 4 sessions   Time 3   Period Weeks   Status New          SLP Long Term Goals - 04/17/15 1126    SLP LONG TERM GOAL #1   Title pt will present evidence of practicing abdominal breathing (via practice log) over three sessions    Time 7   Period Weeks   Status On-going   SLP LONG TERM GOAL #2   Title pt will report decr in cough frequency by 50%   Time 7   Period Weeks   Status On-going   SLP LONG TERM GOAL #3   Title pt will report spontaneous attempting of relaxation strategies outside clinic during episodes   Time 7   Period Weeks   Status On-going   SLP LONG TERM GOAL #4   Title pt will demo abdominal breathing in sentence responses 75% of the time   Time 7   Status On-going   SLP LONG TERM GOAL #5   Title pt will demo abdominal breathing in 5 minutes simple conversation, 50% of the time   Time 7   Period Weeks   Status New          Plan - 04/17/15 1117    Clinical Impression Statement Pt beginning  to realize that AB helps decr her frequency and/or severity of vocal cord dysfunction (VCD) symptoms. She is unsure at this time whether her outside stressors are going to subside enough to make positive impact on her VCD. Skilled ST to cont at this time to incr proper breathing and decr VCD symptoms.   Speech Therapy Frequency 2x / week   Duration --  7 weeks   Treatment/Interventions Functional tasks;Patient/family education;Compensatory strategies;SLP instruction and feedback;Internal/external  aids   Potential to Achieve Goals Good   Potential Considerations Severity of impairments        Problem List Patient Active Problem  List   Diagnosis Date Noted  . Bronchitis, acute 04/11/2015  . OSA (obstructive sleep apnea) 10/18/2014  . Nodule of right lung 06/14/2014  . Dyspnea 06/13/2014  . Coronary artery calcification 04/17/2014  . Aneurysm, ascending aorta 04/17/2014  . Chronic cough 02/24/2014  . Mold suspected exposure 02/24/2014    [facility] , [place], [place]  04/17/2015, 11:27 AM  [facility] [address] [place], [place], [address] Phone: [phone]   Fax:  [phone]

## 2015-04-17 NOTE — Patient Instructions (Signed)
Continue practicing Abdominal breathing - x3/day for 15-20 minutes per time Think about the other relaxation strategies as you complete the breathing Consider if this is the best time for you to complete this therapy with all the other things you are responsible for at this time. We will continue to talk about this.

## 2015-04-18 ENCOUNTER — Other Ambulatory Visit: Payer: Self-pay | Admitting: Internal Medicine

## 2015-04-18 ENCOUNTER — Encounter: Payer: Medicare PPO | Admitting: *Deleted

## 2015-04-18 DIAGNOSIS — J45909 Unspecified asthma, uncomplicated: Secondary | ICD-10-CM

## 2015-04-18 DIAGNOSIS — J4520 Mild intermittent asthma, uncomplicated: Secondary | ICD-10-CM | POA: Diagnosis not present

## 2015-04-18 NOTE — Progress Notes (Signed)
Daily Session Note  Patient Details  Name: SHUNDRA WIRSING MRN: [id number] Date of Birth: 1946-08-06 Referring Provider:  Maryland Pink, MD  Encounter Date: 04/18/2015  Check In:     Session Check In - 04/18/15 1025    Check-In   Staff Present Lestine Box BS, ACSM EP-C, Exercise Physiologist;Renee Dillard [place] MS, ACSM CEP Exercise Physiologist;Laureen Janell Quiet, RRT, Respiratory Therapist   ER physicians immediately available to respond to emergencies [facility] immediately available ER MD   Physician(s) schaevit and quale   Medication changes reported     No   Fall or balance concerns reported    No   Warm-up and Cool-down Performed on first and last piece of equipment   VAD Patient? No   Pain Assessment   Currently in Pain? No/denies         Goals Met:  Proper associated with RPD/PD & O2 Sat Exercise tolerated well No report of cardiac concerns or symptoms Strength training completed today  Goals Unmet:  Not Applicable  Goals Comments:  Amaira decreased the watts on the XR to L3/60 watts.  Dr. Emily Filbert is Medical Director for [facility] and [facility].

## 2015-04-20 ENCOUNTER — Ambulatory Visit: Payer: Medicare PPO

## 2015-04-20 ENCOUNTER — Encounter: Payer: Medicare PPO | Admitting: *Deleted

## 2015-04-20 DIAGNOSIS — J45909 Unspecified asthma, uncomplicated: Secondary | ICD-10-CM

## 2015-04-20 DIAGNOSIS — J383 Other diseases of vocal cords: Secondary | ICD-10-CM | POA: Diagnosis not present

## 2015-04-20 DIAGNOSIS — R05 Cough: Secondary | ICD-10-CM

## 2015-04-20 DIAGNOSIS — R053 Chronic cough: Secondary | ICD-10-CM

## 2015-04-20 NOTE — Patient Instructions (Signed)
Continue to work with the abdominal breathing as much as you can.  You are welcome to return to therapy at a later date if you feel you need it. We will need another prescription from Dr. Chase Caller.

## 2015-04-20 NOTE — Therapy (Addendum)
[facility] [address] [place], [place], [address] Phone: [phone]   Fax:  [phone]  Speech Language Pathology Treatment  Patient Details  Name: Caitlin Leonard MRN: [id number] Date of Birth: 08-21-1946 Referring Provider:  Maryland Pink, MD  Encounter Date: 04/20/2015      End of Session - 04/20/15 1644    Visit Number 4   Number of Visits 16   Date for SLP Re-Evaluation 06/10/15   SLP Start Time 18   SLP Stop Time  1442   SLP Time Calculation (min) 40 min   Activity Tolerance Patient tolerated treatment well      Past Medical History  Diagnosis Date  . Hypertension     on medication x 10 years  . Heart murmur     MVP ( symptomatic)  . Dysrhythmia   . Asthma     due to seasonal alllergies  . Sleep apnea 2007    does not use CPAP since 40 # wt loss  . GERD (gastroesophageal reflux disease)   . History of IBS   . Collagenous colitis 2010  . Headache(784.0)     migraines; 2 x month  . Arthritis     osteoarthritis  . Anxiety   . Depression     situational  . Hyperlipemia   . Syncopal episodes   . Cataract   . Shortness of breath     exertional    Past Surgical History  Procedure Laterality Date  . Cardiac catheterization  2012    [facility]  . Lumbar laminectomy  [id number]  . Back surgery    . Tonsillectomy    . Knee arthroscopy  1985, 1990, 2012 x 2    bilateral  . Cholecystectomy  2011  . Nasal sinus surgery  1994  . Abdominal hysterectomy  1979  . Total knee arthroplasty  12/22/2011    Procedure: TOTAL KNEE ARTHROPLASTY;  Surgeon: Rudean Haskell, MD;  Location: [facility];  Service: Orthopedics;  Laterality: Left;    There were no vitals filed for this visit.  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 "I'm very stressed out."   Currently in Pain? No/denies                ADULT SLP TREATMENT - 04/20/15 1410    General Information   Behavior/Cognition Alert;Cooperative;Pleasant mood   Treatment Provided   Treatment provided Cognitive-Linquistic   Pain Assessment   Pain Assessment No/denies pain   Cognitive-Linquistic Treatment   Treatment focused on Voice   Skilled Treatment Coughing now just to clear from the throat, at least 30-40% reduction in cough frequency since last visit. Pt noticed cough in exercise class has decreased in frequency as well. SLP worked with patient with abdominal breathing (AB) in sentence responses with one possible coughing episode but pt took sips H2O to subside. In sentence responses using AB, SLP's skilled observation noted approx 80% succes with AB. Pt informed SLP she has to cut therapy course short due to ailing mother and stress involved with that.    Assessment / Recommendations / Plan   Plan Continue with current plan of care   Progression Toward Goals   Progression toward goals Progressing toward goals            SLP Short Term Goals - 04/20/15 1646    SLP  SHORT TERM GOAL #1   Title frequency of cough decr by 15%   Status Achieved   SLP SHORT TERM GOAL #2   Title pt will demo abdominal breathing at rest 50% of the time   Status Achieved   SLP SHORT TERM GOAL #3   Title pt will respond in sentences using abdominal breathing 30% of the time   Status Achieved   SLP SHORT TERM GOAL #4   Title pt will attempt one relaxation strategy outside ST    Time 3   Period Weeks   Status Unable to assess   SLP SHORT TERM GOAL #5   Title pt will present to SLP evidence of practicing abdominal breathing at rest (practice log) during 4 sessions   Time 3   Period Weeks   Status Unable to assess          SLP Long Term Goals - 04/20/15 1646    SLP LONG TERM GOAL #1   Title pt will present evidence of practicing abdominal breathing (via practice log) over three sessions    Time 7   Period Weeks    Status Not Met   SLP LONG TERM GOAL #2   Title pt will report decr in cough frequency by 50%   Time 7   Period Weeks   Status Not Met   SLP LONG TERM GOAL #3   Title pt will report spontaneous attempting of relaxation strategies outside clinic during episodes   Time 7   Period Weeks   Status Unable to assess   SLP LONG TERM GOAL #4   Title pt will demo abdominal breathing in sentence responses 75% of the time   Time 7   Status Achieved   SLP LONG TERM GOAL #5   Title pt will demo abdominal breathing in 5 minutes simple conversation, 50% of the time   Time 7   Period Weeks   Status Not Met          Plan - 04/20/15 1645    Clinical Impression Statement Pt's use of AB in sentence responses was very good, however she feels the need to cut this therapy course short due to her stress level with her mother in failing health. Discharge to occur today.   Treatment/Interventions Functional tasks;Patient/family education;Compensatory strategies;SLP instruction and feedback;Internal/external aids   Potential to Achieve Goals Good       SPEECH THERAPY DISCHARGE SUMMARY  Visits from Start of Care: 4  Current functional level related to goals / functional outcomes: Pt met most goals for abdominal breathing, however for abdominal breathing (AB) in conversation goal could not be assessed due to pt discharge. Discharge occurred on 04-20-15 after pt told SLP she was very stressed with her mother's failing health. After practicing AB for three days, pt reported her frequency of coughing had decr'd by approx 30%.   Remaining deficits: Paradoxical vocal fold motion   Education / Equipment: AB, Vocal cord dysfunction  Plan: Patient agrees to discharge.  Patient goals were partially met. Patient is being discharged due to the patient's request.  ?????    G-codes ST- Voice- Goal (R1165) CI (1-20% impaired) ST- Voice- D/C (B9038) CJ (20-39% impaired)   Problem List Patient Active Problem  List   Diagnosis Date Noted  . Bronchitis, acute 04/11/2015  . OSA (obstructive sleep apnea) 10/18/2014  . Nodule of right lung 06/14/2014  . Dyspnea 06/13/2014  . Coronary artery calcification 04/17/2014  . Aneurysm, ascending aorta 04/17/2014  . Chronic  cough 02/24/2014  . Mold suspected exposure 02/24/2014    [facility] , [place], [place] 04/20/2015, 4:48 PM  [facility] [address] [place], [place], [address] Phone: [phone]   Fax:  [phone]

## 2015-04-20 NOTE — Progress Notes (Signed)
Daily Session Note  Patient Details  Name: Caitlin Leonard MRN: [id number] Date of Birth: Apr 06, 1946 Referring Provider:  [place] Pink, MD  Encounter Date: 04/20/2015  Check In:     Session Check In - 04/20/15 1100    Check-In   Staff Present Heath Lark RN, BSN, CCRP;Renee [place] MS, ACSM CEP Exercise Physiologist;Stacey Blanch Media RRT, RCP Respiratory Therapist   ER physicians immediately available to respond to emergencies [facility] immediately available ER MD   Physician(s) Edd Fabian and Joni Fears   Medication changes reported     No   Fall or balance concerns reported    No   Warm-up and Cool-down Performed on first and last piece of equipment   VAD Patient? No   Pain Assessment   Currently in Pain? No/denies           Exercise Prescription Changes - 04/20/15 1000    Resistance Training   Training Prescription Yes   Weight 2  2lbs caused shoulder pain   Reps 10-15   Interval Training   Interval Training No   NuStep   Level 3  BIOSTEP INSTEAD OF NS; L3, 30w, 64mins   Watts 40   Minutes 15   Arm Ergometer   Level 3   Watts 15   Minutes 10   REL-XR   Level 3   Watts 70   Minutes 15      Goals Met:  Independence with exercise equipment Using PLB without cueing & demonstrates good technique Exercise tolerated well Strength training completed today  Goals Unmet:  Not Applicable  Goals Comments: [name] came into class today stating she was going to fire her doctor.  She further explained she has speech therapy prescribed in [place]. It is wearing on her to drive 30 miles 3 times a week.  Advised her she could ask to have her therapy her at [facility] to save her the distance and holpfully time if she can switch her therapist.    Dr. Emily Filbert is Medical Director for [place] and [facility].

## 2015-04-23 ENCOUNTER — Encounter: Payer: Medicare PPO | Admitting: *Deleted

## 2015-04-23 DIAGNOSIS — J45909 Unspecified asthma, uncomplicated: Secondary | ICD-10-CM

## 2015-04-23 DIAGNOSIS — R053 Chronic cough: Secondary | ICD-10-CM

## 2015-04-23 DIAGNOSIS — R05 Cough: Secondary | ICD-10-CM

## 2015-04-23 DIAGNOSIS — J4520 Mild intermittent asthma, uncomplicated: Secondary | ICD-10-CM | POA: Diagnosis not present

## 2015-04-23 NOTE — Progress Notes (Signed)
Daily Session Note  Patient Details  Name: Caitlin Leonard MRN: [id number] Date of Birth: Jul 10, 1946 Referring Provider:  [place] Pink, MD  Encounter Date: 04/23/2015  Check In:     Session Check In - 04/23/15 1033    Check-In   Staff Present Laureen Owens Shark BS, RRT, Respiratory Therapist;Steven Way BS, ACSM EP-C, Exercise Physiologist;Jurell Basista Alfonso Patten, ACSM CEP Exercise Physiologist   ER physicians immediately available to respond to emergencies [facility] immediately available ER MD   Physician(s) Dr. Jacqualine Code and Jimmye Norman   Medication changes reported     No   Fall or balance concerns reported    No   Warm-up and Cool-down Performed on first and last piece of equipment   VAD Patient? No   Pain Assessment   Currently in Pain? No/denies   Multiple Pain Sites No           Exercise Prescription Changes - 04/23/15 1000    Response to Exercise   Blood Pressure (Admit) 126/70 mmHg   Blood Pressure (Exercise) 132/72 mmHg   Blood Pressure (Exit) 126/78 mmHg   Heart Rate (Admit) 86 bpm   Heart Rate (Exercise) 122 bpm   Heart Rate (Exit) 76 bpm   Oxygen Saturation (Admit) 93 %   Oxygen Saturation (Exercise) 93 %   Oxygen Saturation (Exit) 96 %   Rating of Perceived Exertion (Exercise) 13   Perceived Dyspnea (Exercise) 3   Duration Progress to 30 minutes of continuous aerobic without signs/symptoms of physical distress   Progression Continue progressive overload as per policy without signs/symptoms or physical distress.   Resistance Training   Training Prescription Yes   Weight 2  2lbs caused shoulder pain   Reps 10-15   Interval Training   Interval Training No   NuStep   Level 3  BIOSTEP INSTEAD OF NS; L3, 30w, 66mins   Watts 40   Minutes 15   Arm Ergometer   Level 3   Watts 15   Minutes 10   REL-XR   Level 4   Watts 60   Minutes 15      Goals Met:  Proper associated with RPD/PD & O2 Sat Independence with exercise equipment Exercise tolerated well Personal goals  reviewed Strength training completed today  Goals Unmet:  Not Applicable  Goals Comments:    Dr. Emily Filbert is Medical Director for [place] and [facility].

## 2015-04-23 NOTE — Progress Notes (Signed)
Pulmonary Individual Treatment Plan  Patient Details  Name: Caitlin Leonard MRN: [id number] Date of Birth: [birth year] Referring Provider:  Ida Rogue  Initial Encounter Date:  03/06/15  Visit Diagnosis: Asthma, chronic, unspecified asthma severity, uncomplicated  Chronic cough  Patient's Home Medications on Admission:  Current outpatient prescriptions:  .  albuterol (PROVENTIL HFA;VENTOLIN HFA) 108 (90 BASE) MCG/ACT inhaler, Inhale 1-2 puffs into the lungs every 6 (six) hours as needed for wheezing or shortness of breath., Disp: 1 Inhaler, Rfl: 3 .  ALPRAZolam (XANAX) 0.5 MG tablet, Take 1 tablet by mouth at bedtime., Disp: , Rfl:  .  Azelastine HCl 0.15 % SOLN, Place 1 spray into the nose 2 (two) times daily as needed., Disp: 30 mL, Rfl: 1 .  diltiazem (CARDIZEM CD) 180 MG 24 hr capsule, Take 1 capsule (180 mg total) by mouth daily., Disp: 90 capsule, Rfl: 3 .  diltiazem (CARDIZEM) 30 MG tablet, Take 1 tablet (30 mg total) by mouth 3 (three) times daily as needed., Disp: 90 tablet, Rfl: 3 .  EPIPEN 2-PAK 0.3 MG/0.3ML SOAJ injection, as needed., Disp: , Rfl:  .  fluticasone-salmeterol (ADVAIR HFA) 115-21 MCG/ACT inhaler, Inhale 2 puffs into the lungs 2 (two) times daily., Disp: , Rfl:  .  levocetirizine (XYZAL) 5 MG tablet, Take 5 mg by mouth at bedtime. , Disp: , Rfl:  .  montelukast (SINGULAIR) 10 MG tablet, Take 1 tablet by mouth daily., Disp: , Rfl:  .  NASONEX 50 MCG/ACT nasal spray, Place 2 sprays into the nose daily. , Disp: , Rfl:  .  Pregabalin (LYRICA PO), Take by mouth 2 (two) times daily., Disp: , Rfl:  .  promethazine-codeine (PHENERGAN WITH CODEINE) 6.25-10 MG/5ML syrup, Take 5 mLs by mouth every 6 (six) hours as needed for cough., Disp: 240 mL, Rfl: 0 .  propranolol (INDERAL) 20 MG tablet, Take 1 tablet (20 mg total) by mouth 3 (three) times daily as needed., Disp: 90 tablet, Rfl: 3 .  sertraline (ZOLOFT) 100 MG tablet, Take 100 mg by mouth 2 (two) times daily. , Disp: ,  Rfl:  .  simvastatin (ZOCOR) 20 MG tablet, Take 20 mg by mouth daily., Disp: , Rfl:  .  Vitamin D, Ergocalciferol, (DRISDOL) 50000 UNITS CAPS capsule, Take 1 capsule by mouth once a week., Disp: , Rfl:   Past Medical History: Past Medical History  Diagnosis Date  . Hypertension     on medication x 10 years  . Heart murmur     MVP ( symptomatic)  . Dysrhythmia   . Asthma     due to seasonal alllergies  . Sleep apnea 2007    does not use CPAP since 40 # wt loss  . GERD (gastroesophageal reflux disease)   . History of IBS   . Collagenous colitis 2010  . Headache(784.0)     migraines; 2 x month  . Arthritis     osteoarthritis  . Anxiety   . Depression     situational  . Hyperlipemia   . Syncopal episodes   . Cataract   . Shortness of breath     exertional    Tobacco Use: History  Smoking status  . Never Smoker   Smokeless tobacco  . Never Used    Labs: Recent Review Flowsheet Data    There is no flowsheet data to display.       ADL UCSD:     ADL UCSD      03/06/15 0900  ADL [facility]SD   ADL Phase Entry     SOB Score total 44     Rest 0     Walk 3     Stairs 5     Bath 2     Dress 2     Shop 4         Pulmonary Function Assessment:     Pulmonary Function Assessment - 03/06/15 0900    Pulmonary Function Tests   RV% 75 %   DLCO% 71 %   Initial Spirometry Results   FVC% 90 %   FEV1% 96 %   FEV1/FVC Ratio 82   Post Bronchodilator Spirometry Results   FVC% 98 %   FEV1% 105 %   FEV1/FVC Ratio 82      Exercise Target Goals:    Exercise Program Goal: Individual exercise prescription set with THRR, safety & activity barriers. Participant demonstrates ability to understand and report RPE using BORG scale, to self-measure pulse accurately, and to acknowledge the importance of the exercise prescription.  Exercise Prescription Goal: Starting with aerobic activity 30 plus minutes a day, 3 days per week for initial exercise prescription. Provide  home exercise prescription and guidelines that participant acknowledges understanding prior to discharge.  Activity Barriers & Risk Stratification:     Activity Barriers & Risk Stratification - 03/06/15 0900    Activity Barriers & Risk Stratification   Activity Barriers Left Knee Replacement   Risk Stratification Low      6 Minute Walk:     6 Minute Walk      03/06/15 1333       6 Minute Walk   Phase Initial     Distance 805 feet     Walk Time 6 minutes     Resting HR 89 bpm     Resting BP 128/86 mmHg     Max Ex. HR 133 bpm     Max Ex. BP 152/80 mmHg     RPE 15     Perceived Dyspnea  4     Symptoms No        Initial Exercise Prescription:     Initial Exercise Prescription - 03/09/15 1300    NuStep   Level 2   Watts 40   Minutes 10   REL-XR   Level 3   Watts 20   Minutes 10      Exercise Prescription Changes:     Exercise Prescription Changes      03/23/15 1100 03/26/15 1000 04/02/15 1000 04/06/15 1000 04/11/15 1100   Exercise Review   Progression  Yes Yes Yes Yes   Response to Exercise   Duration Progress to 30 minutes of continuous aerobic without signs/symptoms of physical distress Progress to 30 minutes of continuous aerobic without signs/symptoms of physical distress Progress to 30 minutes of continuous aerobic without signs/symptoms of physical distress Progress to 30 minutes of continuous aerobic without signs/symptoms of physical distress Progress to 30 minutes of continuous aerobic without signs/symptoms of physical distress   Progression Continue progressive overload as per policy without signs/symptoms or physical distress. Continue progressive overload as per policy without signs/symptoms or physical distress. Continue progressive overload as per policy without signs/symptoms or physical distress. Continue progressive overload as per policy without signs/symptoms or physical distress. Continue progressive overload as per policy without  signs/symptoms or physical distress.   Resistance Training   Training Prescription Yes Yes Yes Yes Yes   Weight $Remov'1 1 2 2 2   'vgKSsO$ Reps 10-15 10-15  10-15 10-15 10-15   Interval Training   Interval Training No No No No No   NuStep   Level $Remo'2 2 2 2 3   'uUKdR$ Watts 40 40 40 40 45   Minutes $Remove'15 15 15 15 15   'MuywEve$ Arm Ergometer   Level  $Remo'2 2 2 2   'OkqDQ$ Watts  $Remo'10 10 10 10   'LnoWE$ Minutes  $Remove'10 10 10 10   'iBuRyyv$ REL-XR   Level $Remo'2 2 2 3 3   'meiVG$ Watts $Rem'30 30 30 30 30   'VxLm$ Minutes $Re'15 15 15 15 15     'Ifj$ 04/13/15 1100 04/16/15 1000 04/20/15 1000 04/23/15 1000     Response to Exercise   Blood Pressure (Admit)  126/70 mmHg  126/70 mmHg    Blood Pressure (Exercise)  132/72 mmHg  132/72 mmHg    Blood Pressure (Exit)  126/78 mmHg  126/78 mmHg    Heart Rate (Admit)  86 bpm  86 bpm    Heart Rate (Exercise)  122 bpm  122 bpm    Heart Rate (Exit)  76 bpm  76 bpm    Oxygen Saturation (Admit)  93 %  93 %    Oxygen Saturation (Exercise)  93 %  93 %    Oxygen Saturation (Exit)  96 %  96 %    Rating of Perceived Exertion (Exercise)  13  13    Perceived Dyspnea (Exercise)  3  3    Duration Progress to 30 minutes of continuous aerobic without signs/symptoms of physical distress Progress to 30 minutes of continuous aerobic without signs/symptoms of physical distress  Progress to 30 minutes of continuous aerobic without signs/symptoms of physical distress    Progression Continue progressive overload as per policy without signs/symptoms or physical distress. Continue progressive overload as per policy without signs/symptoms or physical distress.  Continue progressive overload as per policy without signs/symptoms or physical distress.    Resistance Training   Training Prescription Yes Yes Yes Yes    Weight 2 1  2lbs caused shoulder pain 2  2lbs caused shoulder pain 2  2lbs caused shoulder pain    Reps 10-15 10-15 10-15 10-15    Interval Training   Interval Training No No No No    NuStep   Level 2  BIOSTEP INSTEAD OF NS 3  BIOSTEP INSTEAD OF NS; L3, 30w,  81mins 3  BIOSTEP INSTEAD OF NS; L3, 30w, 94mins 3  BIOSTEP INSTEAD OF NS; L3, 30w, 79mins    Watts 30 40 40 40    Minutes $Remove'15 15 15 15    'rsEaTsW$ Arm Ergometer   Level $Remo'2 3 3 3    'abLPk$ Watts $Rem'15 15 15 15    'RrPT$ Minutes $Re'10 10 10 10    'UbU$ REL-XR   Level  $Remo'3 3 4    'SecRp$ Watts  70 70 60    Minutes  $Remove'15 15 15       'NiQccQF$ Discharge Exercise Prescription (Final Exercise Prescription Changes):     Exercise Prescription Changes - 04/23/15 1000    Response to Exercise   Blood Pressure (Admit) 126/70 mmHg   Blood Pressure (Exercise) 132/72 mmHg   Blood Pressure (Exit) 126/78 mmHg   Heart Rate (Admit) 86 bpm   Heart Rate (Exercise) 122 bpm   Heart Rate (Exit) 76 bpm   Oxygen Saturation (Admit) 93 %   Oxygen Saturation (Exercise) 93 %   Oxygen Saturation (Exit) 96 %   Rating of Perceived Exertion (Exercise) 13   Perceived Dyspnea (Exercise) 3  Duration Progress to 30 minutes of continuous aerobic without signs/symptoms of physical distress   Progression Continue progressive overload as per policy without signs/symptoms or physical distress.   Resistance Training   Training Prescription Yes   Weight 2  2lbs caused shoulder pain   Reps 10-15   Interval Training   Interval Training No   NuStep   Level 3  BIOSTEP INSTEAD OF NS; L3, 30w, 80mins   Watts 40   Minutes 15   Arm Ergometer   Level 3   Watts 15   Minutes 10   REL-XR   Level 4   Watts 60   Minutes 15       Nutrition:  Target Goals: Understanding of nutrition guidelines, daily intake of sodium '1500mg'$ , cholesterol '200mg'$ , calories 30% from fat and 7% or less from saturated fats, daily to have 5 or more servings of fruits and vegetables.  Biometrics:     Pre Biometrics - 03/06/15 1339    Pre Biometrics   Height $Remov'5\' 8"'vAeewI$  (1.727 m)   Weight 254 lb (115.214 kg)   Waist Circumference 41.5 inches   Hip Circumference 51 inches   Waist to Hip Ratio 0.81 %   BMI (Calculated) 38.7       Nutrition Therapy Plan and Nutrition Goals:     Nutrition  Therapy & Goals - 03/06/15 0900    Nutrition Therapy   Diet Ms Hizer prefers not to meet with the dietitian, although her goal to loss weight is 55lbs; she knows able mutrition and what to do.      Nutrition Discharge: Rate Your Plate Scores:   Psychosocial: Target Goals: Acknowledge presence or absence of depression, maximize coping skills, provide positive support system. Participant is able to verbalize types and ability to use techniques and skills needed for reducing stress and depression.  Initial Review & Psychosocial Screening:     Initial Psych Review & Screening - 03/06/15 0900    Initial Review   Current issues with Current Depression   Family Dynamics   Good Support System? Yes  Good support from her husband and 2 sons.   Comments Ms Hommes is dealing with her parent's health - they are both in their 37's with serious health concerns, and she is the only child. She also has depression from her medical conserns.   Barriers   Psychosocial barriers to participate in program The patient should benefit from training in stress management and relaxation.   Screening Interventions   Interventions Encouraged to exercise  Ms Alvelo meets with a counselor weekly.      Quality of Life Scores:     Quality of Life - 03/06/15 0900    Quality of Life Scores   Health/Function Pre 7.69 %   Socioeconomic Pre 27 %   Psych/Spiritual Pre 12.14 %   Family Pre 10.8 %   GLOBAL Pre 12.47 %      PHQ-9:     Recent Review Flowsheet Data    Depression screen [place] 2/9 03/06/2015   Decreased Interest 1   Down, Depressed, Hopeless 2   PHQ - 2 Score 3   Altered sleeping 1   Tired, decreased energy 1   Change in appetite 1   Feeling bad or failure about yourself  2   Trouble concentrating 1   Moving slowly or fidgety/restless 0   Suicidal thoughts 0   PHQ-9 Score 9   Difficult doing work/chores Somewhat difficult      Psychosocial Evaluation and  Intervention:      Psychosocial Evaluation - 03/12/15 1232    Psychosocial Evaluation & Interventions   Interventions Stress management education;Relaxation education;Encouraged to exercise with the program and follow exercise prescription   Comments Counselor met with Ms. Borski for initial psychosocial evaluation.  She is a 69 year old who came into this program as a result of breathing problems and an "undiagnosed cough" that she has struggled with for quite some time.  Ms. Shepheard has a strong support  system with a spouse of 53 years and is actively involved in her faith community.  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  Ms. Chauca states she has struggled with depression for the past (3) years subsequent to caring for her sick parents long distance (as they live in [place], and refuse to move to [facility]).  Ms. Mennella has been seeing a psychologist/counselor to support her through this time as well as taking medication as prescribed.  She reports stress from caring from her parents, traveling back and forth to help them in [place], and her own medical issues.  Her goals for this program are to increase her energy and ability to breathe better and possibly lose some weight in the process.     Continued Psychosocial Services Needed Yes  Ms. Fetterly will benefit from the psychoeducational components of this program, especially stress management and depression.  She also will benefit from the relaxation components as well as seeing the dietician for help with her weight loss goals.      Psychosocial Re-Evaluation:     Psychosocial Re-Evaluation      03/26/15 1110           Psychosocial Re-Evaluation   Comments Ms. Durrett has been out of class for several weeks and returned today.  Met with counselor briefly reporting her father had passed away in [place]  and she has been there taking care of the affairs.  She stated she was doing "okay" currently and made it through Father's Day yesterday.  Counselor offered supportive services and will follow with Ms. Koegel as needed.          Education: Education Goals: Education classes will be provided on a weekly basis, covering required topics. Participant will state understanding/return demonstration of topics presented.  Learning Barriers/Preferences:   Education Topics: Initial Evaluation Education: - Verbal, written and demonstration of respiratory meds, RPE/PD scales, oximetry and breathing techniques. Instruction on use of nebulizers and MDIs: cleaning and proper use, rinsing mouth with steroid doses and importance of monitoring MDI activations.          Pulmonary Rehab from 04/20/2015 in [place]   Date  03/06/15   Educator  LB   Instruction Review Code  2- meets goals/outcomes      General Nutrition Guidelines/Fats and Fiber: -Group instruction provided by verbal, written material, models and posters to present the general guidelines for heart healthy nutrition. Gives an explanation and review of dietary fats and fiber.      Pulmonary Rehab from 04/20/2015 in [place]   Date  04/02/15   Educator  CR   Instruction Review Code  2- meets goals/outcomes      Controlling Sodium/Reading Food Labels: -Group verbal and written material supporting the discussion of sodium use in heart healthy nutrition. Review and  explanation with models, verbal and written materials for utilization of the food label.   Exercise Physiology & Risk Factors: - Group verbal and written instruction with models to review the exercise physiology of the cardiovascular system and associated critical values. Details cardiovascular disease risk factors and the goals associated with each risk factor.   Aerobic Exercise & Resistance  Training: - Gives group verbal and written discussion on the health impact of inactivity. On the components of aerobic and resistive training programs and the benefits of this training and how to safely progress through these programs.      Pulmonary Rehab from 04/20/2015 in [place]   Date  04/11/15   Educator  SW   Instruction Review Code  2- meets goals/outcomes      Flexibility, Balance, General Exercise Guidelines: - Provides group verbal and written instruction on the benefits of flexibility and balance training programs. Provides general exercise guidelines with specific guidelines to those with heart or lung disease. Demonstration and skill practice provided.   Stress Management: - Provides group verbal and written instruction about the health risks of elevated stress, cause of high stress, and healthy ways to reduce stress.   Depression: - Provides group verbal and written instruction on the correlation between heart/lung disease and depressed mood, treatment options, and the stigmas associated with seeking treatment.   Exercise & Equipment Safety: - Individual verbal instruction and demonstration of equipment use and safety with use of the equipment.      Pulmonary Rehab from 04/20/2015 in [place]   Date  03/09/15   Educator  LB   Instruction Review Code  2- meets goals/outcomes      Infection Prevention: - Provides verbal and written material to individual with discussion of infection control including proper hand washing and proper equipment cleaning during exercise session.      Pulmonary Rehab from 04/20/2015 in [place]   Date  03/06/15   Educator  LB   Instruction Review Code  2- meets goals/outcomes      Falls Prevention: - Provides verbal and written material to individual with discussion of falls prevention and safety.      Pulmonary Rehab  from 04/20/2015 in [place]   Date  03/06/15   Educator  LB   Instruction Review Code  2- meets goals/outcomes      Diabetes: - Individual verbal and written instruction to review signs/symptoms of diabetes, desired ranges of glucose level fasting, after meals and with exercise. Advice that pre and post exercise glucose checks will be done for 3 sessions at entry of program.   Chronic Lung Diseases: - Group verbal and written instruction to review new updates, new respiratory medications, new advancements in procedures and treatments. Provide informative websites and "800" numbers of self-education.   Lung Procedures: - Group verbal and written instruction to describe testing methods done to diagnose lung disease. Review the outcome of test results. Describe the treatment choices: Pulmonary Function Tests, ABGs and oximetry.   Energy Conservation: - Provide group verbal and written instruction for methods to conserve energy, plan and organize activities. Instruct on pacing techniques, use of adaptive equipment and posture/positioning to relieve shortness of breath.   Triggers: - Group verbal and written instruction to review types of environmental controls: home humidity, furnaces, filters, dust mite/pet prevention, HEPA vacuums. To discuss weather changes, air quality and the benefits of nasal washing.  Pulmonary Rehab from 04/20/2015 in [place]   Date  04/16/15   Educator  LB   Instruction Review Code  2- meets goals/outcomes      Exacerbations: - Group verbal and written instruction to provide: warning signs, infection symptoms, calling MD promptly, preventive modes, and value of vaccinations. Review: effective airway clearance, coughing and/or vibration techniques. Create an Sports administrator.      Pulmonary Rehab from 04/20/2015 in [place]   Date  03/12/15    Educator  LB   Instruction Review Code  2- meets goals/outcomes      Oxygen: - Individual and group verbal and written instruction on oxygen therapy. Includes supplement oxygen, available portable oxygen systems, continuous and intermittent flow rates, oxygen safety, concentrators, and Medicare reimbursement for oxygen.   Respiratory Medications: - Group verbal and written instruction to review medications for lung disease. Drug class, frequency, complications, importance of spacers, rinsing mouth after steroid MDI's, and proper cleaning methods for nebulizers.      Pulmonary Rehab from 04/20/2015 in [place]   Date  03/06/15   Educator  LB   Instruction Review Code  2- meets goals/outcomes      AED/CPR: - Group verbal and written instruction with the use of models to demonstrate the basic use of the AED with the basic ABC's of resuscitation.   Breathing Retraining: - Provides individuals verbal and written instruction on purpose, frequency, and proper technique of diaphragmatic breathing and pursed-lipped breathing. Applies individual practice skills.      Pulmonary Rehab from 04/20/2015 in [place]   Date  03/06/15   Educator  LB [03/09/2015 Reviewed PLB and encouraged to use with exercise]   Instruction Review Code  2- meets goals/outcomes      Anatomy and Physiology of the Lungs: - Group verbal and written instruction with the use of models to provide basic lung anatomy and physiology related to function, structure and complications of lung disease.      Pulmonary Rehab from 04/20/2015 in [place]   Date  03/30/15   Educator  [place]   Instruction Review Code  2- meets goals/outcomes      Heart Failure: - Group verbal and written instruction on the basics of heart failure: signs/symptoms, treatments, explanation of ejection fraction, enlarged heart and  cardiomyopathy.   Sleep Apnea: - Individual verbal and written instruction to review Obstructive Sleep Apnea. Review of risk factors, methods for diagnosing and types of masks and machines for OSA.   Anxiety: - Provides group, verbal and written instruction on the correlation between heart/lung disease and anxiety, treatment options, and management of anxiety.   Relaxation: - Provides group, verbal and written instruction about the benefits of relaxation for patients with heart/lung disease. Also provides patients with examples of relaxation techniques.   Knowledge Questionnaire Score:     Knowledge Questionnaire Score - 03/06/15 0900    Knowledge Questionnaire Score   Pre Score -3      Personal Goals and Risk Factors at Admission:     Personal Goals and Risk Factors at Admission - 03/06/15 0900    Personal Goals and Risk Factors on Admission    Weight Management Yes   Intervention Learn and follow the exercise and diet guidelines while in the program. Utilize the nutrition and education classes to help gain knowledge of the diet and exercise expectations in the program  Increase Aerobic Exercise and Physical Activity Yes   Intervention While in program, learn and follow the exercise prescription taught. Start at a low level workload and increase workload after able to maintain previous level for 30 minutes. Increase time before increasing intensity.   Understand more about Heart/Pulmonary Disease. Yes   Intervention While in program utilize professionals for any questions, and attend the education sessions. Great websites to use are www.americanheart.org or www.lung.org for reliable information.   Improve shortness of breath with ADL's Yes   Intervention While in program, learn and follow the exercise prescription taught. Start at a low level workload and increase workload ad advised by the exercise physiologist. Increase time before increasing intensity.   Develop more  efficient breathing techniques such as purse lipped breathing and diaphragmatic breathing; and practicing self-pacing with activity Yes   Intervention While in program, learn and utilize the specific breathing techniques taught to you. Continue to practice and use the techniques as needed.   Increase knowledge of respiratory medications and ability to use respiratory devices properly.  Yes   Intervention While in program learn and demonstrate appropriate use of your oxygen therapy by increasing flow with exertion, manage oxygen tank operation, including continuous and intermittent flow.  Understanding oxygen is a drug ordered by your physician.   Hypertension Yes   Goal Participant will see blood pressure controlled within the values of 140/33mm/Hg or within value directed by their physician.   Intervention Provide nutrition & aerobic exercise along with prescribed medications to achieve BP 140/90 or less.   Stress Yes   Goal To meet with psychosocial counselor for stress and relaxation information and guidance. To state understanding of performing relaxation techniques and or identifying personal stressors.   Intervention Provide education on types of stress, identifiying stressors, and ways to cope with stress. Provide demonstration and active practice of relaxation techniques.      Personal Goals and Risk Factors Review:      Goals and Risk Factor Review      04/02/15 1000 04/04/15 1000 04/11/15 1034 04/16/15 1000     Weight Management   Goals Progress/Improvement seen   No     Comments   Has not seen much progress in this area, but understands the weightloss process and that it takes time and consistency.      Increase Aerobic Exercise and Physical Activity   Goals Progress/Improvement seen   Yes Yes     Comments  Ms Landrus on her own initiative increased the level on the XR to L3. She did reduce her weights to 1lb due to left shoulder pain from the 2lbs on Monday. Has felt a large and  noticeable difference in her stamina and strength     Understand more about Heart/Pulmonary Disease   Goals Progress/Improvement seen    Yes     Comments   Is more comfortable managing her disease. Has learned a lot in the educational sessions and from talking with staff and other patients     Improve shortness of breath with ADL's   Goals Progress/Improvement seen    No     Comments   Has not seen a noticeable difference in this area     Breathing Techniques   Goals Progress/Improvement seen  Yes  Yes     Comments Observing Ms Keir using PLB during her exercise goals; she states she finds it helpful with activities at home as well.  Has seen a noticeable difference in how effective the breathing  techniques are and uses them on her own     Increase knowledge of respiratory medications   Goals Progress/Improvement seen     Yes    Comments    Ms Densmore has been taking her Advair only in the morning and not in the evening; she was taking her albuterol in the evening instead; instructed her to take the Advair twice a day and then she may not even need the Albuterol.       Personal Goals Discharge:    Comments: 30 Day Review

## 2015-04-25 ENCOUNTER — Encounter: Payer: Medicare PPO | Admitting: *Deleted

## 2015-04-25 DIAGNOSIS — J4520 Mild intermittent asthma, uncomplicated: Secondary | ICD-10-CM | POA: Diagnosis not present

## 2015-04-25 DIAGNOSIS — J45909 Unspecified asthma, uncomplicated: Secondary | ICD-10-CM

## 2015-04-25 NOTE — Progress Notes (Signed)
Daily Session Note  Patient Details  Name: Caitlin Leonard MRN: [id number] Date of Birth: 11/14/1945 Referring Provider:  Maryland Pink, MD  Encounter Date: 04/25/2015  Check In:     Session Check In - 04/25/15 1039    Check-In   Staff Present Candiss Norse Caitlin, ACSM CEP Exercise Physiologist;Steven Way BS, ACSM EP-C, Exercise Physiologist;Laureen Janell Quiet, RRT, Respiratory Therapist   ER physicians immediately available to respond to emergencies [facility] immediately available ER MD   Physician(s) Joni Fears and Edd Fabian   Medication changes reported     No   Fall or balance concerns reported    No   Warm-up and Cool-down Performed on first and last piece of equipment   VAD Patient? No   Pain Assessment   Currently in Pain? No/denies   Multiple Pain Sites No           Exercise Prescription Changes - 04/25/15 1000    Exercise Review   Progression Yes   Response to Exercise   Duration Progress to 30 minutes of continuous aerobic without signs/symptoms of physical distress   Intensity THRR unchanged   Progression Continue progressive overload as per policy without signs/symptoms or physical distress.   Resistance Training   Training Prescription Yes   Weight 2  2lbs caused shoulder pain   Reps 10-15   Interval Training   Interval Training No   NuStep   Level 4  BIOSTEP INSTEAD OF NS; L3, 30w, 55mns   Watts 35   Minutes 15   Arm Ergometer   Level 3   Watts 15   Minutes 10   REL-XR   Level 4   Watts 60   Minutes 15      Goals Met:  Proper associated with RPD/PD & O2 Sat Independence with exercise equipment Using PLB without cueing & demonstrates good technique Exercise tolerated well Strength training completed today  Goals Unmet:  Not Applicable  Goals Comments: Caitlin Leonard a follow-up with her doctor and her doctor was impressed with her progress in the program. She said she complimented our program to her doctor and is very thankful for her referral to  come, she is really enjoying the exercise and the progress she is making.   Caitlin Leonard's AIC increased from 6.0 to 6.7; plans to recheck in 466mo and referred to the [name]  Dr. MaEmily Leonard Medical Director for [facility].

## 2015-04-26 ENCOUNTER — Encounter: Payer: Medicare PPO | Admitting: Speech Pathology

## 2015-04-27 ENCOUNTER — Encounter: Payer: Medicare PPO | Admitting: *Deleted

## 2015-04-27 DIAGNOSIS — J4520 Mild intermittent asthma, uncomplicated: Secondary | ICD-10-CM | POA: Diagnosis not present

## 2015-04-27 DIAGNOSIS — J45909 Unspecified asthma, uncomplicated: Secondary | ICD-10-CM

## 2015-04-27 DIAGNOSIS — R053 Chronic cough: Secondary | ICD-10-CM

## 2015-04-27 DIAGNOSIS — R05 Cough: Secondary | ICD-10-CM

## 2015-04-27 NOTE — Progress Notes (Signed)
Daily Session Note  Patient Details  Name: Caitlin Leonard MRN: [id number] Date of Birth: [birth year] Referring Provider:  Maryland Pink, MD  Encounter Date: 04/27/2015  Check In:     Session Check In - 04/27/15 1039    Check-In   Staff Present Heath Lark RN, BSN, CCRP;Stacey Blanch Media RRT, RCP Respiratory Therapist;Mayana Irigoyen RN, BSN   ER physicians immediately available to respond to emergencies [facility] immediately available ER MD   Physician(s) Dr. Archie Balboa, Dr. Corky Downs   Medication changes reported     No   Fall or balance concerns reported    No   Warm-up and Cool-down Performed on first and last piece of equipment   VAD Patient? No   Pain Assessment   Currently in Pain? No/denies         Goals Met:  Proper associated with RPD/PD & O2 Sat Exercise tolerated well  Goals Unmet:  Not Applicable  Goals Comments: 6 minute Mid Walk done today.    Dr. Emily Filbert is Medical Director for [place] [facility] and [facility].

## 2015-04-30 ENCOUNTER — Ambulatory Visit (INDEPENDENT_AMBULATORY_CARE_PROVIDER_SITE_OTHER): Payer: Medicare PPO | Admitting: Cardiovascular Disease

## 2015-04-30 ENCOUNTER — Encounter: Payer: Self-pay | Admitting: Cardiovascular Disease

## 2015-04-30 ENCOUNTER — Encounter: Payer: Medicare PPO | Admitting: *Deleted

## 2015-04-30 VITALS — BP 120/90 | HR 87 | Ht 68.0 in | Wt 252.5 lb

## 2015-04-30 DIAGNOSIS — J4520 Mild intermittent asthma, uncomplicated: Secondary | ICD-10-CM | POA: Diagnosis not present

## 2015-04-30 DIAGNOSIS — I7121 Aneurysm of the ascending aorta, without rupture: Secondary | ICD-10-CM

## 2015-04-30 DIAGNOSIS — R0600 Dyspnea, unspecified: Secondary | ICD-10-CM | POA: Diagnosis not present

## 2015-04-30 DIAGNOSIS — I712 Thoracic aortic aneurysm, without rupture: Secondary | ICD-10-CM | POA: Diagnosis not present

## 2015-04-30 DIAGNOSIS — R053 Chronic cough: Secondary | ICD-10-CM

## 2015-04-30 DIAGNOSIS — I2584 Coronary atherosclerosis due to calcified coronary lesion: Secondary | ICD-10-CM

## 2015-04-30 DIAGNOSIS — R0602 Shortness of breath: Secondary | ICD-10-CM

## 2015-04-30 DIAGNOSIS — R05 Cough: Secondary | ICD-10-CM

## 2015-04-30 DIAGNOSIS — J45909 Unspecified asthma, uncomplicated: Secondary | ICD-10-CM

## 2015-04-30 DIAGNOSIS — I2510 Atherosclerotic heart disease of native coronary artery without angina pectoris: Secondary | ICD-10-CM

## 2015-04-30 NOTE — Progress Notes (Signed)
Daily Session Note  Patient Details  Name: Caitlin Leonard MRN: [id number] Date of Birth: 1946-05-21 Referring Provider:  Minna Merritts, MD  Encounter Date: 04/30/2015  Check In:     Session Check In - 04/30/15 1045    Check-In   Staff Present Candiss Norse MS, ACSM CEP Exercise Physiologist;Laureen Janell Quiet, RRT, Respiratory Therapist;Ashonti Leandro Alfonso Patten, ACSM CEP Exercise Physiologist   ER physicians immediately available to respond to emergencies [facility] immediately available ER MD   Physician(s) Dr. Archie Balboa and Dr. Corky Downs   Medication changes reported     No   Fall or balance concerns reported    No   Warm-up and Cool-down Performed on first and last piece of equipment   VAD Patient? No   Pain Assessment   Currently in Pain? No/denies   Multiple Pain Sites No           Exercise Prescription Changes - 04/30/15 1000    Exercise Review   Progression Yes   Response to Exercise   Duration Progress to 30 minutes of continuous aerobic without signs/symptoms of physical distress   Intensity THRR unchanged   Progression Continue progressive overload as per policy without signs/symptoms or physical distress.   Resistance Training   Training Prescription Yes   Weight 2  2lbs caused shoulder pain   Reps 10-15   Interval Training   Interval Training No   NuStep   Level 4  BIOSTEP INSTEAD OF NS; L3, 30w, 73mins   Watts 35   Minutes 15   Arm Ergometer   Level 3   Watts 15   Minutes 10   REL-XR   Level 4   Watts 65   Minutes 15      Goals Met:  Proper associated with RPD/PD & O2 Sat Independence with exercise equipment Exercise tolerated well Personal goals reviewed Strength training completed today  Goals Unmet:  Not Applicable  Goals Comments:    Dr. Emily Filbert is Medical Director for [facility] and [facility].

## 2015-04-30 NOTE — Assessment & Plan Note (Signed)
Currently with no symptoms of angina Recommended aggressive cholesterol management, goal LDL less than 70

## 2015-04-30 NOTE — Assessment & Plan Note (Signed)
Stable 4 cm ascending aorta. This can likely be managed with periodic echocardiogram

## 2015-04-30 NOTE — Progress Notes (Signed)
Patient ID: Caitlin Leonard, female    DOB: [birth year], 69 y.o.   MRN: [id number]  HPI Comments: Caitlin Leonard is a 69 year old woman with history of obesity, chronic cough, chronic exertional dyspnea, who presents for routine follow-up of her cough and shortness of breath She has been seen by pulmonary for her exertional shortness of breath and chronic cough. 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 Reports that she passed all other testing. Weight gain continues to be an issue, stable but had 50 pound weight gain following prednisone. She feels she may have a sinus issue, postnasal drip, green sputum at times. She has had sinus surgery in the past. Hemoglobin A1c 6.0 up to 6.7. Cough was better for a period of time, now back again  Significant stress at home, lost her father June 2016, mother is in hospice  EKG on today's visit shows normal sinus rhythm with rate 87 bpm, no significant ST or T-wave changes  Other past medical history Previously seen by Caitlin Leonard as well as cardiology through [facility] for her symptoms. Symptoms of shortness of breath for more than 6 months, very limiting. No regular exercise as she feels unable to ambulate. Also reports having tachycardia with minimal exertion. Periods of near syncope at times, occasional chest pain   PFTs July 2015 which were normal, chest CT scan May 2015, also again not showing interstitial lung disease Echocardiogram July 2015 showing mildly elevated right heart pressures, right ventricular systolic pressure of 40 Right and left heart catheterizations November 2015 showing no significant CAD, filling pressures were not elevated Possible sleep apnea Holter monitor has been performed showing APCs and PVCs She has been  tried on Lasix daily but felt dehydrated Continues on Advair  Includes carotid ultrasounds from August 2010 showing minimal carotid plaquing  Staged symptom limited exercise treadmill testing implies that she has mild to moderate functional limitation, diastolic dysfunction suggested    Allergies  Allergen Reactions  . Contrast Media [Iodinated Diagnostic Agents] Anaphylaxis  . Hydroxychloroquine Sulfate Hives  . Propranolol Hcl Hives  . Sulfonamide Derivatives Hives  . Metronidazole Rash    Outpatient Encounter Prescriptions as of 67/25/2016  Medication Sig  . albuterol (PROVENTIL HFA;VENTOLIN HFA) 108 (90 BASE) MCG/ACT inhaler Inhale 1-2 puffs into the lungs every 6 (six) hours as needed for wheezing or shortness of breath.  . ALPRAZolam (XANAX) 0.5 MG tablet Take 1 tablet by mouth at bedtime.  . Azelastine HCl 0.15 % SOLN Place 1 spray into the nose 2 (two) times daily as needed.  . diltiazem (CARDIZEM CD) 180 MG 24 hr capsule Take 1 capsule (180 mg total) by mouth daily.  Marland Kitchen diltiazem (CARDIZEM) 30 MG tablet Take 1 tablet (30 mg total) by mouth 3 (three) times daily as needed.  Marland Kitchen EPIPEN 2-PAK 0.3 MG/0.3ML SOAJ injection as needed.  . fluticasone-salmeterol (ADVAIR HFA) 115-21 MCG/ACT inhaler Inhale 2 puffs into the lungs 2 (two) times daily.  Marland Kitchen levocetirizine (XYZAL) 5 MG tablet Take 5 mg by mouth at bedtime.   Marland Kitchen LYRICA 75 MG capsule Take 75 mg by mouth 2 (two) times daily.   . montelukast (SINGULAIR) 10 MG tablet Take 1 tablet by mouth daily.  Marland Kitchen NASONEX 50 MCG/ACT nasal spray Place 2 sprays into the nose daily.   . propranolol (INDERAL)  20 MG tablet Take 1 tablet (20 mg total) by mouth 3 (three) times daily as needed.  . sertraline (ZOLOFT) 100 MG tablet Take 100 mg by mouth 2 (two) times daily.   . simvastatin (ZOCOR) 20 MG tablet Take 20 mg by mouth daily.  . Vitamin D, Ergocalciferol, (DRISDOL) 50000 UNITS CAPS capsule Take 1 capsule by mouth once a week.  . [DISCONTINUED]  Pregabalin (LYRICA PO) Take by mouth 2 (two) times daily.  . [DISCONTINUED] promethazine-codeine (PHENERGAN WITH CODEINE) 6.25-10 MG/5ML syrup Take 5 mLs by mouth every 6 (six) hours as needed for cough. (Patient not taking: Reported on 04/30/2015)   No facility-administered encounter medications on file as of 67/25/2016.    Past Medical History  Diagnosis Date  . Hypertension     on medication x 10 years  . Heart murmur     MVP ( symptomatic)  . Dysrhythmia   . Asthma     due to seasonal alllergies  . Sleep apnea 2007    does not use CPAP since 40 # wt loss  . GERD (gastroesophageal reflux disease)   . History of IBS   . Collagenous colitis 2010  . Headache(784.0)     migraines; 2 x month  . Arthritis     osteoarthritis  . Anxiety   . Depression     situational  . Hyperlipemia   . Syncopal episodes   . Cataract   . Shortness of breath     exertional    Past Surgical History  Procedure Laterality Date  . Cardiac catheterization  2012    [facility]  . Lumbar laminectomy  [id number]  . Back surgery    . Tonsillectomy    . Knee arthroscopy  1985, 1990, 2012 x 2    bilateral  . Cholecystectomy  2011  . Nasal sinus surgery  1994  . Abdominal hysterectomy  1979  . Total knee arthroplasty  12/22/2011    Procedure: TOTAL KNEE ARTHROPLASTY;  Surgeon: Rudean Haskell, MD;  Location: [facility];  Service: Orthopedics;  Laterality: Left;    Social History  reports that she has never smoked. She has never used smokeless tobacco. She reports that she does not drink alcohol or use illicit drugs.  Family History family history includes Arrhythmia in her father; Hyperlipidemia in her father and mother; Hypertension in her father and mother. There is no history of Anesthesia problems.    Review of Systems  Constitutional: Negative.   Respiratory: Positive for cough and shortness of breath.   Cardiovascular: Negative.   Gastrointestinal: Negative.   Musculoskeletal: Negative.   Skin:  Negative.   Neurological: Negative.   Hematological: Negative.   Psychiatric/Behavioral: Negative.   All other systems reviewed and are negative.   BP 120/90 mmHg  Pulse 87  Ht 5\' 8"  (1.727 m)  Wt 252 lb 8 oz (114.533 kg)  BMI 38.40 kg/m2  Physical Exam  Constitutional: She is oriented to person, place, and time. She appears well-developed and well-nourished.  Obese, bronchospastic cough sparingly  HENT:  Head: Normocephalic.  Nose: Nose normal.  Mouth/Throat: Oropharynx is clear and moist.  Eyes: Conjunctivae are normal. Pupils are equal, round, and reactive to light.  Neck: Normal range of motion. Neck supple. No JVD present.  Cardiovascular: Normal rate, regular rhythm, S1 normal, S2 normal, normal heart sounds and intact distal pulses.  Exam reveals no gallop and no friction rub.   No murmur heard. Pulmonary/Chest: Effort normal and breath sounds normal. No respiratory distress.  She has no wheezes. She has no rales. She exhibits no tenderness.  Abdominal: Soft. Bowel sounds are normal. She exhibits no distension. There is no tenderness.  Musculoskeletal: Normal range of motion. She exhibits no edema or tenderness.  Lymphadenopathy:    She has no cervical adenopathy.  Neurological: She is alert and oriented to person, place, and time. Coordination normal.  Skin: Skin is warm and dry. No rash noted. No erythema.  Psychiatric: She has a normal mood and affect. Her behavior is normal. Judgment and thought content normal.    Assessment and Plan  Nursing note and vitals reviewed.

## 2015-04-30 NOTE — Patient Instructions (Addendum)
You are doing well. No medication changes were made.  Please call if you would like a one a day B-blocker, bystolic, for heart rate control  I agree that it might be a good idea to see ENT, [place] Try zyrtec (cetirazine)  Please call us if you have new issues that need to be addressed before your next appt.  Your physician wants you to follow-up in: 6 months.  You will receive a reminder letter in the mail two months in advance. If you don't receive a letter, please call our office to schedule the follow-up appointment.

## 2015-04-30 NOTE — Assessment & Plan Note (Signed)
Improvement of her symptoms with increasing exercise since her last clinic visit, now participating with pulmonary rehabilitation

## 2015-04-30 NOTE — Assessment & Plan Note (Signed)
Occasional bronchospastic cough. If symptoms get worse, could try tramadol. She is concerned about sinus and postnasal drip. Previously seen by ENT with history of sinus surgery. If symptoms persist, could follow-up with ENT

## 2015-05-02 ENCOUNTER — Encounter: Payer: Medicare PPO | Admitting: Speech Pathology

## 2015-05-02 ENCOUNTER — Encounter: Payer: Medicare PPO | Admitting: *Deleted

## 2015-05-02 DIAGNOSIS — J45909 Unspecified asthma, uncomplicated: Secondary | ICD-10-CM

## 2015-05-02 DIAGNOSIS — R05 Cough: Secondary | ICD-10-CM

## 2015-05-02 DIAGNOSIS — J4520 Mild intermittent asthma, uncomplicated: Secondary | ICD-10-CM | POA: Diagnosis not present

## 2015-05-02 DIAGNOSIS — R053 Chronic cough: Secondary | ICD-10-CM

## 2015-05-02 NOTE — Progress Notes (Signed)
Daily Session Note  Patient Details  Name: Caitlin Leonard MRN: [id number] Date of Birth: August 02, 1946 Referring Provider:  Minna Merritts, MD  Encounter Date: 05/02/2015  Check In:     Session Check In - 05/02/15 1025    Check-In   Staff Present Frederich Cha RRT, RCP Respiratory Therapist;Brenleigh Collet Dillard Essex MS, ACSM CEP Exercise Physiologist;Steven Way BS, ACSM EP-C, Exercise Physiologist   ER physicians immediately available to respond to emergencies [facility] immediately available ER MD   Physician(s) Kerman Passey and Lord   Medication changes reported     No   Fall or balance concerns reported    No   Warm-up and Cool-down Performed on first and last piece of equipment   VAD Patient? No   Pain Assessment   Currently in Pain? No/denies   Multiple Pain Sites No           Exercise Prescription Changes - 05/02/15 1000    Exercise Review   Progression Yes   Response to Exercise   Duration Progress to 30 minutes of continuous aerobic without signs/symptoms of physical distress   Intensity THRR unchanged   Progression Continue progressive overload as per policy without signs/symptoms or physical distress.   Resistance Training   Training Prescription Yes   Weight 2  2lbs caused shoulder pain   Reps 10-15   Interval Training   Interval Training No   NuStep   Level 5  BIOSTEP INSTEAD OF NS; L3, 30w, 50mns   Watts 40   Minutes 15   Arm Ergometer   Level 3   Watts 15   Minutes 10   REL-XR   Level 4   Watts 65   Minutes 15      Goals Met:  Proper associated with RPD/PD & O2 Sat Independence with exercise equipment Using PLB without cueing & demonstrates good technique Exercise tolerated well Personal goals reviewed Strength training completed today  Goals Unmet:  Not Applicable  Goals Comments:   Dr. MEmily Filbertis Medical Director for [facility].

## 2015-05-04 DIAGNOSIS — R053 Chronic cough: Secondary | ICD-10-CM

## 2015-05-04 DIAGNOSIS — J4520 Mild intermittent asthma, uncomplicated: Secondary | ICD-10-CM | POA: Diagnosis not present

## 2015-05-04 DIAGNOSIS — R05 Cough: Secondary | ICD-10-CM

## 2015-05-04 NOTE — Progress Notes (Signed)
Daily Session Note  Patient Details  Name: MAVERICK PATMAN MRN: [id number] Date of Birth: 1945/11/11 Referring Provider:  Minna Merritts, MD  Encounter Date: 05/04/2015  Check In:     Session Check In - 05/04/15 1044    Check-In   Staff Present Heath Lark RN, BSN, CCRP;Laureen Janell Quiet, RRT, Respiratory Therapist;Other   ER physicians immediately available to respond to emergencies [facility] immediately available ER MD   Physician(s) Thomasene Lot and Jimmye Norman   Medication changes reported     No   Warm-up and Cool-down Performed on first and last piece of equipment   VAD Patient? No   Pain Assessment   Currently in Pain? No/denies         Goals Met:  Proper associated with RPD/PD & O2 Sat Independence with exercise equipment Using PLB without cueing & demonstrates good technique Exercise tolerated well Strength training completed today  Goals Unmet:  Not Applicable  Goals Comments: Progressing well with her exercise prescription. Ms Wachtel will be on vacation and will miss Mon and Wed of next week.   Dr. Emily Filbert is Medical Director for [facility] and [facility].

## 2015-05-07 ENCOUNTER — Encounter: Payer: Medicare PPO | Attending: Cardiovascular Disease

## 2015-05-07 DIAGNOSIS — J4520 Mild intermittent asthma, uncomplicated: Secondary | ICD-10-CM | POA: Insufficient documentation

## 2015-05-10 ENCOUNTER — Ambulatory Visit: Payer: Medicare PPO | Admitting: *Deleted

## 2015-05-10 ENCOUNTER — Encounter: Payer: Medicare PPO | Admitting: Speech Pathology

## 2015-05-11 ENCOUNTER — Encounter: Payer: Self-pay | Admitting: *Deleted

## 2015-05-11 ENCOUNTER — Encounter: Payer: Medicare PPO | Admitting: *Deleted

## 2015-05-11 VITALS — BP 130/90 | Ht 68.0 in | Wt 252.2 lb

## 2015-05-11 DIAGNOSIS — E119 Type 2 diabetes mellitus without complications: Secondary | ICD-10-CM

## 2015-05-11 DIAGNOSIS — J4520 Mild intermittent asthma, uncomplicated: Secondary | ICD-10-CM

## 2015-05-11 NOTE — Progress Notes (Signed)
Diabetes Self-Management Education  Visit Type: First/Initial  Appt. Start Time: 1325 Appt. End Time: 1430  05/11/2015  [name], identified by name and date of birth, is a 69 y.o. female with a diagnosis of Diabetes: Type 2.    ASSESSMENT Blood pressure 130/90, height 5\' 8"  (1.727 m), weight 252 lb 3.2 oz (114.397 kg). Body mass index is 38.36 kg/(m^2).  Initial Visit Information: Are you currently following a meal plan?: Yes What type of meal plan do you follow?: eats same foods Are you taking your medications as prescribed?: Yes Are you checking your feet?: Yes How many days per week are you checking your feet?: 3 How often do you need to have someone help you when you read instructions, pamphlets, or other written materials from your doctor or pharmacy?: 1 - Never What is the last grade level you completed in school?: Masters in Education  Psychosocial: Patient Belief/Attitude about Diabetes: Afraid ("scared") Self-care barriers: Unsteady gait/risk for falls Self-management support: Family, Doctor's office Patient Concerns: Monitoring, Healthy Lifestyle, Glycemic Control, Weight Control, Other (comment) (stamina for breathing) Special Needs: None Preferred Learning Style: Auditory, Visual, Hands on Learning Readiness: Contemplating  Pt is seeing psychologist weekly.   Complications:  Last HgB A1C per patient/outside source: 6.7 % (04/12/15) How often do you check your blood sugar?: 1-2 times/day Fasting Blood glucose range (mg/dL): 70-129 (FBG's 100-124 mg/dL with reading of 112 mg/dL today) Have you had a dilated eye exam in the past 12 months?: Yes Have you had a dental exam in the past 12 months?: Yes  Diet Intake: Breakfast: cereal, milk and fruit Lunch: [place] sandwich or 5 peanut butter crackers, Glucerna or Ensure on days of [facility] [employer] (afternoon): diet drink Dinner: Conservation officer, historic buildings with no bun, sometimes fried pickles Beverage(s): unsweetened  beverages  Exercise: Exercise: Moderate (swimming / aerobic walking) ([facility] 3 x week) Moderate Exercise amount of time (min / week): (90 minutes 3 x week)  Individualized Plan for Diabetes Self-Management Training:  Learning Objective:  Patient will have a greater understanding of diabetes self-management.  Education Topics Reviewed with Patient Today: Factors that contribute to the development of diabetes Role of diet in the treatment of diabetes and the relationship between the three main macronutrients and blood glucose level Role of exercise on diabetes management, blood pressure control and cardiac health. Other (comment) (Use of steroids in past which elevate blood sugars) Purpose and frequency of SMBG., Identified appropriate SMBG and/or A1C goals. Relationship between chronic complications and blood glucose control Role of stress on diabetes (Pt's father died in 09-Apr-2023 and her mother is in Hospice out of state)  PATIENTS GOALS/Plan (Developed by the patient): Improve blood sugars Prevent diabetes complications Lose weight Lead a healthier lifestyle Become more fit  Plan:   Patient Instructions  Check blood sugars 1 x day before breakfast or 2 hrs after supper every day Exercise: Continue with [facility] for 90 minutes 3 days a week Eat 3 meals day,   1-2 snacks a day Space meals 4-6 hours apart Complete 3 Day Food Record and bring to next appt Bring blood sugar records to the next appointment Return for appointment on:  Thursday May 31, 2015 at 10:30 am with Jeannene Patella (Dietitian)  Expected Outcomes:  Demonstrated interest in learning. Expect positive outcomes  Education material provided: General Meal Planning Guidelines 3 Day Food Record  If problems or questions, patient to contact team via:  Caitlin Leonard, [place], [place], CDE [phone]  Future  DSME appointment:   Pt didn't want to come to classes due to pain in her knees and sitting long periods. She is also  attending Pulmonary Rehab 3 x week. She agreed for 2 Hr Refresher program. Her next appointment is Thursday May 31, 2015 at 10:30 am with the dietitian.

## 2015-05-11 NOTE — Patient Instructions (Addendum)
Check blood sugars 1 x day before breakfast or 2 hrs after supper every day Exercise: Continue with [facility] for 90 minutes 3 days a week Eat 3 meals day,   1-2 snacks a day Space meals 4-6 hours apart Complete 3 Day Food Record and bring to next appt Bring blood sugar records to the next appointment Return for appointment on:  Thursday May 31, 2015 at 10:30 am with Caitlin Leonard (Dietitian)

## 2015-05-11 NOTE — Progress Notes (Signed)
Daily Session Note  Patient Details  Name: Caitlin Leonard MRN: [id number] Date of Birth: 10-04-1946 Referring Provider:  Minna Merritts, MD  Encounter Date: 05/11/2015  Check In:     Session Check In - 05/11/15 1115    Check-In   Staff Present Frederich Cha RRT, RCP Respiratory Therapist;Renee Dillard Essex MS, ACSM CEP Exercise Physiologist;Susanne Bice RN, BSN, CCRP   ER physicians immediately available to respond to emergencies [facility] immediately available ER MD   Physician(s) Dr. Jimmye Norman, and Dr. Marcelene Butte    Medication changes reported     No   Fall or balance concerns reported    No   Warm-up and Cool-down Performed on first and last piece of equipment   VAD Patient? No   Pain Assessment   Currently in Pain? Other (Comment)  No change from chronic inter pain           Exercise Prescription Changes - 05/11/15 1100    Exercise Review   Progression Yes   Response to Exercise   Duration Progress to 30 minutes of continuous aerobic without signs/symptoms of physical distress   Intensity THRR unchanged   Progression Continue progressive overload as per policy without signs/symptoms or physical distress.   Resistance Training   Training Prescription Yes   Weight 2  2lbs caused shoulder pain   Reps 10-15   Interval Training   Interval Training No   NuStep   Level 5  BIOSTEP INSTEAD OF NS; L3, 30w, 34mins   Watts 40   Minutes 15   Arm Ergometer   Level 3   Watts 15   Minutes 10   REL-XR   Level 4   Watts 65   Minutes 15      Goals Met:  Proper associated with RPD/PD & O2 Sat Exercise tolerated well  Goals Unmet:  Not Applicable  Goals Comments: Doing well on REL [id number] exercise equipment. Increased Biostep to 40 Watts.    Dr. Emily Filbert is Medical Director for [place] and [facility].

## 2015-05-14 ENCOUNTER — Encounter: Payer: Medicare PPO | Admitting: *Deleted

## 2015-05-14 DIAGNOSIS — J4520 Mild intermittent asthma, uncomplicated: Secondary | ICD-10-CM | POA: Diagnosis not present

## 2015-05-14 DIAGNOSIS — R053 Chronic cough: Secondary | ICD-10-CM

## 2015-05-14 DIAGNOSIS — R05 Cough: Secondary | ICD-10-CM

## 2015-05-14 NOTE — Progress Notes (Signed)
Daily Session Note  Patient Details  Name: Caitlin Leonard MRN: [id number] Date of Birth: 17-Jan-1946 Referring Provider:  Minna Merritts, MD  Encounter Date: 05/14/2015  Check In:     Session Check In - 05/14/15 1105    Check-In   Staff Present Caitlin Norse Caitlin, ACSM CEP Exercise Physiologist;Caitlin Leonard, RRT, Respiratory Therapist;Caitlin Leonard, ACSM CEP Exercise Physiologist   ER physicians immediately available to respond to emergencies [facility] immediately available ER MD   Physician(s) Caitlin Leonard and Caitlin Leonard   Medication changes reported     No   Fall or balance concerns reported    No   Warm-up and Cool-down Performed on first and last piece of equipment   VAD Patient? No   Pain Assessment   Currently in Pain? No/denies   Multiple Pain Sites No         Goals Met:  Proper associated with RPD/PD & O2 Sat Independence with exercise equipment Exercise tolerated well Strength training completed today  Goals Unmet:  Not Applicable  Goals Comments: Progressing with exercise goals.                                   Due to knee pain, Caitlin Leonard will decrease her time goal on the BioStep, increase her time on the AE to 12 mins, and plans to change the rotation of her exercise goals.   Dr. Emily Leonard is Medical Director for [place] and [facility].

## 2015-05-14 NOTE — Progress Notes (Deleted)
Daily Session Note  Patient Details  Name: Caitlin Leonard MRN: [id number] Date of Birth: [birth year] Referring Provider:  Minna Merritts, MD  Encounter Date: 05/14/2015  Check In:     Session Check In - 05/14/15 1034    Check-In   Staff Present Candiss Norse MS, ACSM CEP Exercise Physiologist;Susanne Bice RN, BSN, CCRP;Paolina Karwowski Amedeo Plenty BS, ACSM CEP Exercise Physiologist   ER physicians immediately available to respond to emergencies [facility] immediately available ER MD   Physician(s) Jacqualine Code and Paduchowski    Medication changes reported     No   Fall or balance concerns reported    No   Warm-up and Cool-down Performed on first and last piece of equipment   VAD Patient? No   Pain Assessment   Currently in Pain? No/denies   Multiple Pain Sites No         Goals Met:  Proper associated with RPD/PD & O2 Sat Independence with exercise equipment Exercise tolerated well Strength training completed today  Goals Unmet:  Not Applicable  Goals Comments: Progressing with exercise goals.    Dr. Emily Filbert is Medical Director for [place] and [facility].

## 2015-05-16 ENCOUNTER — Encounter: Payer: Medicare PPO | Admitting: *Deleted

## 2015-05-16 DIAGNOSIS — J4520 Mild intermittent asthma, uncomplicated: Secondary | ICD-10-CM

## 2015-05-16 DIAGNOSIS — R05 Cough: Secondary | ICD-10-CM

## 2015-05-16 DIAGNOSIS — R053 Chronic cough: Secondary | ICD-10-CM

## 2015-05-16 NOTE — Progress Notes (Signed)
Daily Session Note  Patient Details  Name: Caitlin Leonard MRN: [id number] Date of Birth: 06/28/1946 Referring Provider:  Minna Merritts, MD  Encounter Date: 05/16/2015  Check In:     Session Check In - 05/16/15 1152    Check-In   Staff Present Laureen Owens Shark BS, RRT, Respiratory Therapist;Naiomi Musto Dillard Essex MS, ACSM CEP Exercise Physiologist;Steven Way BS, ACSM EP-C, Exercise Physiologist   ER physicians immediately available to respond to emergencies [facility] immediately available ER MD   Physician(s) Jimmye Norman and Reita Cliche   Medication changes reported     No   Fall or balance concerns reported    No   Warm-up and Cool-down Performed on first and last piece of equipment   VAD Patient? No   Pain Assessment   Currently in Pain? No/denies   Multiple Pain Sites No         Goals Met:  Proper associated with RPD/PD & O2 Sat Independence with exercise equipment Using PLB without cueing & demonstrates good technique Exercise tolerated well Strength training completed today  Goals Unmet:  Not Applicable  Goals Comments: Reviewed individualized exercise prescription and made increases per departmental policy. Exercise increases were discussed with the patient and they were able to perform the new work loads without issue (no signs or symptoms).     Dr. Emily Filbert is Medical Director for [place] and [facility].

## 2015-05-17 ENCOUNTER — Encounter: Payer: Medicare PPO | Admitting: Speech Pathology

## 2015-05-18 ENCOUNTER — Encounter: Payer: Medicare PPO | Admitting: *Deleted

## 2015-05-18 DIAGNOSIS — J45909 Unspecified asthma, uncomplicated: Secondary | ICD-10-CM

## 2015-05-18 DIAGNOSIS — J4520 Mild intermittent asthma, uncomplicated: Secondary | ICD-10-CM | POA: Diagnosis not present

## 2015-05-18 NOTE — Progress Notes (Signed)
Daily Session Note  Patient Details  Name: Caitlin Leonard MRN: [id number] Date of Birth: 1946/01/11 Referring Provider:  [place] Pink, MD  Encounter Date: 05/18/2015  Check In:     Session Check In - 05/18/15 1101    Check-In   Staff Present Heath Lark RN, BSN, CCRP;Stacey Blanch Media RRT, RCP Respiratory Therapist;Renee Dillard Essex MS, ACSM CEP Exercise Physiologist   ER physicians immediately available to respond to emergencies [facility] immediately available ER MD   Physician(s) Dr. Jacqualine Code, and Dr. Corky Downs   Medication changes reported     No   Fall or balance concerns reported    No   Warm-up and Cool-down Performed on first and last piece of equipment   VAD Patient? No   Pain Assessment   Currently in Pain? No/denies           Exercise Prescription Changes - 05/18/15 1100    Exercise Review   Progression Yes   Response to Exercise   Blood Pressure (Admit) 124/70 mmHg   Blood Pressure (Exercise) 152/76 mmHg   Blood Pressure (Exit) 124/82 mmHg   Heart Rate (Admit) 86 bpm   Heart Rate (Exercise) 107 bpm   Heart Rate (Exit) 74 bpm   Oxygen Saturation (Admit) 95 %   Oxygen Saturation (Exercise) 94 %   Oxygen Saturation (Exit) 93 %   Rating of Perceived Exertion (Exercise) 13   Perceived Dyspnea (Exercise) 3   Duration Progress to 30 minutes of continuous aerobic without signs/symptoms of physical distress   Intensity THRR unchanged   Progression Continue progressive overload as per policy without signs/symptoms or physical distress.   Resistance Training   Training Prescription Yes   Weight 2  2lbs caused shoulder pain   Reps 10-15   Interval Training   Interval Training No   NuStep   Level 5  BIOSTEP INSTEAD OF NS; L3, 30w, 51mns   Watts 40   Minutes 15   Arm Ergometer   Level 3   Watts 15   Minutes 12   REL-XR   Level 5   Watts 70   Minutes 15      Goals Met:  Proper associated with RPD/PD & O2 Sat Independence with exercise equipment Exercise  tolerated well Strength training completed today  Goals Unmet:  Not Applicable  Goals Comments:    Dr. MEmily Filbertis Medical Director for [facility].

## 2015-05-21 ENCOUNTER — Other Ambulatory Visit: Payer: Self-pay | Admitting: Cardiovascular Disease

## 2015-05-21 DIAGNOSIS — J4520 Mild intermittent asthma, uncomplicated: Secondary | ICD-10-CM | POA: Diagnosis not present

## 2015-05-21 DIAGNOSIS — J45909 Unspecified asthma, uncomplicated: Secondary | ICD-10-CM

## 2015-05-21 NOTE — Progress Notes (Signed)
Daily Session Note  Patient Details  Name: Caitlin Leonard MRN: [id number] Date of Birth: 1946/04/04 Referring Provider:  Minna Merritts, MD  Encounter Date: 05/21/2015  Check In:     Session Check In - 05/21/15 1042    Check-In   Staff Present Lestine Box BS, ACSM EP-C, Exercise Physiologist;Laureen Janell Quiet, RRT, Respiratory Therapist   ER physicians immediately available to respond to emergencies [facility] immediately available ER MD   Physician(s) Karma Greaser and Jimmye Norman   Medication changes reported     No   Fall or balance concerns reported    No   Warm-up and Cool-down Performed on first and last piece of equipment   VAD Patient? No   Pain Assessment   Currently in Pain? No/denies         Goals Met:  Proper associated with RPD/PD & O2 Sat Exercise tolerated well No report of cardiac concerns or symptoms Strength training completed today  Goals Unmet:  Not Applicable  Goals Comments:    Dr. Emily Filbert is Medical Director for [place] and [facility].

## 2015-05-21 NOTE — Progress Notes (Signed)
Pulmonary Individual Treatment Plan  Patient Details  Name: Caitlin Leonard MRN: [id number] Date of Birth: 01-19-1946 Referring Provider:  Minna Merritts, MD  Initial Encounter Date:  03/06/15  Visit Diagnosis: Asthma, chronic, unspecified asthma severity, uncomplicated  Patient's Home Medications on Admission:  Current outpatient prescriptions:  .  albuterol (PROVENTIL HFA;VENTOLIN HFA) 108 (90 BASE) MCG/ACT inhaler, Inhale 1-2 puffs into the lungs every 6 (six) hours as needed for wheezing or shortness of breath., Disp: 1 Inhaler, Rfl: 3 .  ALPRAZolam (XANAX) 0.5 MG tablet, Take 1 tablet by mouth at bedtime., Disp: , Rfl:  .  Azelastine HCl 0.15 % SOLN, Place 1 spray into the nose 2 (two) times daily as needed., Disp: 30 mL, Rfl: 1 .  diltiazem (CARDIZEM CD) 180 MG 24 hr capsule, Take 1 capsule (180 mg total) by mouth daily., Disp: 90 capsule, Rfl: 3 .  diltiazem (CARDIZEM) 30 MG tablet, TAKE 1 TABLET(30 MG) BY MOUTH THREE TIMES DAILY AS NEEDED, Disp: 90 tablet, Rfl: 3 .  EPIPEN 2-PAK 0.3 MG/0.3ML SOAJ injection, as needed., Disp: , Rfl:  .  fluticasone-salmeterol (ADVAIR HFA) 115-21 MCG/ACT inhaler, Inhale 2 puffs into the lungs 2 (two) times daily., Disp: , Rfl:  .  furosemide (LASIX) 20 MG tablet, Take 20 mg by mouth daily as needed., Disp: , Rfl:  .  GARCINIA CAMBO[place]IA-CHROMIUM PO, Take 1 capsule by mouth daily., Disp: , Rfl:  .  levocetirizine (XYZAL) 5 MG tablet, Take 5 mg by mouth at bedtime. , Disp: , Rfl:  .  LYRICA 75 MG capsule, Take 75 mg by mouth 2 (two) times daily. , Disp: , Rfl:  .  Melatonin 10 MG TABS, Take 10 mg by mouth daily., Disp: , Rfl:  .  montelukast (SINGULAIR) 10 MG tablet, Take 1 tablet by mouth daily., Disp: , Rfl:  .  NASONEX 50 MCG/ACT nasal spray, Place 2 sprays into the nose daily. , Disp: , Rfl:  .  propranolol (INDERAL) 20 MG tablet, TAKE 1 TABLET(20 MG) BY MOUTH THREE TIMES DAILY AS NEEDED, Disp: 90 tablet, Rfl: 3 .  sertraline (ZOLOFT) 100 MG  tablet, Take 100 mg by mouth 2 (two) times daily. , Disp: , Rfl:  .  simvastatin (ZOCOR) 20 MG tablet, Take 20 mg by mouth daily., Disp: , Rfl:   Past Medical History: Past Medical History  Diagnosis Date  . Hypertension     on medication x 10 years  . Heart murmur     MVP ( symptomatic)  . Dysrhythmia   . Asthma     due to seasonal alllergies  . Sleep apnea 2007    does not use CPAP since 40 # wt loss  . GERD (gastroesophageal reflux disease)   . History of IBS   . Collagenous colitis 2010  . Headache(784.0)     migraines; 2 x month  . Arthritis     osteoarthritis  . Anxiety   . Depression     situational  . Hyperlipemia   . Syncopal episodes   . Cataract   . Shortness of breath     exertional    Tobacco Use: History  Smoking status  . Never Smoker   Smokeless tobacco  . Never Used    Labs: Recent Review Flowsheet Data    There is no flowsheet data to display.       ADL UCSD:     ADL UCSD      03/06/15 0900 04/27/15 1247  ADL UCSD   ADL Phase Entry Mid    SOB Score total 44 45    Rest 0 3    Walk 3 2    Stairs 5 5    Bath 2 1    Dress 2 3    Shop 4 3        Pulmonary Function Assessment:     Pulmonary Function Assessment - 03/06/15 0900    Pulmonary Function Tests   RV% 75 %   DLCO% 71 %   Initial Spirometry Results   FVC% 90 %   FEV1% 96 %   FEV1/FVC Ratio 82   Post Bronchodilator Spirometry Results   FVC% 98 %   FEV1% 105 %   FEV1/FVC Ratio 82      Exercise Target Goals:    Exercise Program Goal: Individual exercise prescription set with THRR, safety & activity barriers. Participant demonstrates ability to understand and report RPE using BORG scale, to self-measure pulse accurately, and to acknowledge the importance of the exercise prescription.  Exercise Prescription Goal: Starting with aerobic activity 30 plus minutes a day, 3 days per week for initial exercise prescription. Provide home exercise prescription and  guidelines that participant acknowledges understanding prior to discharge.  Activity Barriers & Risk Stratification:     Activity Barriers & Risk Stratification - 03/06/15 0900    Activity Barriers & Risk Stratification   Activity Barriers Left Knee Replacement   Risk Stratification Low      6 Minute Walk:     6 Minute Walk      03/06/15 1333 04/27/15 1111     6 Minute Walk   Phase Initial Mid Program    Distance 805 feet 1150 feet    Distance % Change  30 %    Walk Time 6 minutes 6 minutes    Resting HR 89 bpm 79 bpm    Resting BP 128/86 mmHg 120/80 mmHg    Max Ex. HR 133 bpm 127 bpm    Max Ex. BP 152/80 mmHg 152/90 mmHg    RPE 15 15    Perceived Dyspnea  4 4    Symptoms No No       Initial Exercise Prescription:     Initial Exercise Prescription - 03/09/15 1300    NuStep   Level 2   Watts 40   Minutes 10   REL-XR   Level 3   Watts 20   Minutes 10      Exercise Prescription Changes:     Exercise Prescription Changes      03/23/15 1100 03/26/15 1000 04/02/15 1000 04/06/15 1000 04/11/15 1100   Exercise Review   Progression  Yes Yes Yes Yes   Response to Exercise   Duration Progress to 30 minutes of continuous aerobic without signs/symptoms of physical distress Progress to 30 minutes of continuous aerobic without signs/symptoms of physical distress Progress to 30 minutes of continuous aerobic without signs/symptoms of physical distress Progress to 30 minutes of continuous aerobic without signs/symptoms of physical distress Progress to 30 minutes of continuous aerobic without signs/symptoms of physical distress   Progression Continue progressive overload as per policy without signs/symptoms or physical distress. Continue progressive overload as per policy without signs/symptoms or physical distress. Continue progressive overload as per policy without signs/symptoms or physical distress. Continue progressive overload as per policy without signs/symptoms or  physical distress. Continue progressive overload as per policy without signs/symptoms or physical distress.   Resistance Training   Training Prescription Yes Yes  Yes Yes Yes   Weight $Remov'1 1 2 2 2   'eSLuwM$ Reps 10-15 10-15 10-15 10-15 10-15   Interval Training   Interval Training No No No No No   NuStep   Level $Remo'2 2 2 2 3   'ZQNUn$ Watts 40 40 40 40 45   Minutes $Remove'15 15 15 15 15   'lyDKyBU$ Arm Ergometer   Level  $Remo'2 2 2 2   'Agumm$ Watts  $Remo'10 10 10 10   'fbfsk$ Minutes  $Remove'10 10 10 10   'cMzrIrt$ REL-XR   Level $Remo'2 2 2 3 3   'tQJlH$ Watts $Rem'30 30 30 30 30   'XHZy$ Minutes $Re'15 15 15 15 15     'Efg$ 04/13/15 1100 04/16/15 1000 04/20/15 1000 04/23/15 1000 04/25/15 1000   Exercise Review   Progression     Yes   Response to Exercise   Blood Pressure (Admit)  126/70 mmHg  126/70 mmHg    Blood Pressure (Exercise)  132/72 mmHg  132/72 mmHg    Blood Pressure (Exit)  126/78 mmHg  126/78 mmHg    Heart Rate (Admit)  86 bpm  86 bpm    Heart Rate (Exercise)  122 bpm  122 bpm    Heart Rate (Exit)  76 bpm  76 bpm    Oxygen Saturation (Admit)  93 %  93 %    Oxygen Saturation (Exercise)  93 %  93 %    Oxygen Saturation (Exit)  96 %  96 %    Rating of Perceived Exertion (Exercise)  13  13    Perceived Dyspnea (Exercise)  3  3    Duration Progress to 30 minutes of continuous aerobic without signs/symptoms of physical distress Progress to 30 minutes of continuous aerobic without signs/symptoms of physical distress  Progress to 30 minutes of continuous aerobic without signs/symptoms of physical distress Progress to 30 minutes of continuous aerobic without signs/symptoms of physical distress   Intensity     THRR unchanged   Progression Continue progressive overload as per policy without signs/symptoms or physical distress. Continue progressive overload as per policy without signs/symptoms or physical distress.  Continue progressive overload as per policy without signs/symptoms or physical distress. Continue progressive overload as per policy without signs/symptoms or physical distress.    Resistance Training   Training Prescription Yes Yes Yes Yes Yes   Weight 2 1  2lbs caused shoulder pain 2  2lbs caused shoulder pain 2  2lbs caused shoulder pain 2  2lbs caused shoulder pain   Reps 10-15 10-15 10-15 10-15 10-15   Interval Training   Interval Training No No No No No   NuStep   Level 2  BIOSTEP INSTEAD OF NS 3  BIOSTEP INSTEAD OF NS; L3, 30w, 34mins 3  BIOSTEP INSTEAD OF NS; L3, 30w, 22mins 3  BIOSTEP INSTEAD OF NS; L3, 30w, 67mins 4  BIOSTEP INSTEAD OF NS; L3, 30w, 46mins   Watts 30 40 40 40 35   Minutes $Remove'15 15 15 15 15   'yaGnQZf$ Arm Ergometer   Level $Remo'2 3 3 3 3   'DEgWC$ Watts $Rem'15 15 15 15 15   'zELz$ Minutes $Re'10 10 10 10 10   'HhL$ REL-XR   Level  $Remo'3 3 4 4   'qrrMs$ Watts  70 70 60 60   Minutes  $Remove'15 15 15 15     'TMCaRml$ 04/30/15 1000 05/02/15 1000 05/11/15 1100 05/16/15 1200 05/16/15 1600   Exercise Review   Progression Yes Yes Yes Yes Yes   Response to Exercise   Blood  Pressure (Admit)     124/70 mmHg   Blood Pressure (Exercise)     152/76 mmHg   Blood Pressure (Exit)     124/82 mmHg   Heart Rate (Admit)     86 bpm   Heart Rate (Exercise)     107 bpm   Heart Rate (Exit)     74 bpm   Oxygen Saturation (Admit)     95 %   Oxygen Saturation (Exercise)     94 %   Oxygen Saturation (Exit)     93 %   Rating of Perceived Exertion (Exercise)     13   Perceived Dyspnea (Exercise)     3   Duration Progress to 30 minutes of continuous aerobic without signs/symptoms of physical distress Progress to 30 minutes of continuous aerobic without signs/symptoms of physical distress Progress to 30 minutes of continuous aerobic without signs/symptoms of physical distress Progress to 30 minutes of continuous aerobic without signs/symptoms of physical distress Progress to 30 minutes of continuous aerobic without signs/symptoms of physical distress   Intensity THRR unchanged THRR unchanged THRR unchanged THRR unchanged THRR unchanged   Progression Continue progressive overload as per policy without signs/symptoms or physical  distress. Continue progressive overload as per policy without signs/symptoms or physical distress. Continue progressive overload as per policy without signs/symptoms or physical distress. Continue progressive overload as per policy without signs/symptoms or physical distress. Continue progressive overload as per policy without signs/symptoms or physical distress.   Resistance Training   Training Prescription Yes Yes Yes Yes Yes   Weight 2  2lbs caused shoulder pain 2  2lbs caused shoulder pain 2  2lbs caused shoulder pain 2  2lbs caused shoulder pain 2  2lbs caused shoulder pain   Reps 10-15 10-15 10-15 10-15 10-15   Interval Training   Interval Training No No No No No   NuStep   Level 4  BIOSTEP INSTEAD OF NS; L3, 30w, 24mins 5  BIOSTEP INSTEAD OF NS; L3, 30w, 40mins 5  BIOSTEP INSTEAD OF NS; L3, 30w, 50mins 5  BIOSTEP INSTEAD OF NS; L3, 30w, 28mins 5  BIOSTEP INSTEAD OF NS; L3, 30w, 30mins   Watts 35 40 40 40 40   Minutes $Remove'15 15 15 15 15   'LFqeFSw$ Arm Ergometer   Level $Remo'3 3 3 3 3   'hlZLW$ Watts $Rem'15 15 15 15 15   'YAdJ$ Minutes $Re'10 10 10 10 12   'AJL$ REL-XR   Level $Remo'4 4 4 5 5   'vrgGd$ Watts 65 65 65 70 70   Minutes $Remove'15 15 15 15 15     'IFkvCnl$ 05/18/15 1100           Exercise Review   Progression Yes       Response to Exercise   Blood Pressure (Admit) 124/70 mmHg       Blood Pressure (Exercise) 152/76 mmHg       Blood Pressure (Exit) 124/82 mmHg       Heart Rate (Admit) 86 bpm       Heart Rate (Exercise) 107 bpm       Heart Rate (Exit) 74 bpm       Oxygen Saturation (Admit) 95 %       Oxygen Saturation (Exercise) 94 %       Oxygen Saturation (Exit) 93 %       Rating of Perceived Exertion (Exercise) 13       Perceived Dyspnea (Exercise) 3       Duration Progress to 30  minutes of continuous aerobic without signs/symptoms of physical distress       Intensity THRR unchanged       Progression Continue progressive overload as per policy without signs/symptoms or physical distress.       Resistance Training   Training  Prescription Yes       Weight 2  2lbs caused shoulder pain       Reps 10-15       Interval Training   Interval Training No       NuStep   Level 5  BIOSTEP INSTEAD OF NS; L3, 30w, 71mins       Watts 60       Minutes 15       Arm Ergometer   Level 3       Watts 15       Minutes 12       REL-XR   Level 5       Watts 70       Minutes 15          Discharge Exercise Prescription (Final Exercise Prescription Changes):     Exercise Prescription Changes - 05/18/15 1100    Exercise Review   Progression Yes   Response to Exercise   Blood Pressure (Admit) 124/70 mmHg   Blood Pressure (Exercise) 152/76 mmHg   Blood Pressure (Exit) 124/82 mmHg   Heart Rate (Admit) 86 bpm   Heart Rate (Exercise) 107 bpm   Heart Rate (Exit) 74 bpm   Oxygen Saturation (Admit) 95 %   Oxygen Saturation (Exercise) 94 %   Oxygen Saturation (Exit) 93 %   Rating of Perceived Exertion (Exercise) 13   Perceived Dyspnea (Exercise) 3   Duration Progress to 30 minutes of continuous aerobic without signs/symptoms of physical distress   Intensity THRR unchanged   Progression Continue progressive overload as per policy without signs/symptoms or physical distress.   Resistance Training   Training Prescription Yes   Weight 2  2lbs caused shoulder pain   Reps 10-15   Interval Training   Interval Training No   NuStep   Level 5  BIOSTEP INSTEAD OF NS; L3, 30w, 52mins   Watts 60   Minutes 15   Arm Ergometer   Level 3   Watts 15   Minutes 12   REL-XR   Level 5   Watts 70   Minutes 15       Nutrition:  Target Goals: Understanding of nutrition guidelines, daily intake of sodium '1500mg'$ , cholesterol '200mg'$ , calories 30% from fat and 7% or less from saturated fats, daily to have 5 or more servings of fruits and vegetables.  Biometrics:     Pre Biometrics - 03/06/15 1339    Pre Biometrics   Height $Remov'5\' 8"'QFCAqz$  (1.727 m)   Weight 254 lb (115.214 kg)   Waist Circumference 41.5 inches   Hip Circumference 51  inches   Waist to Hip Ratio 0.81 %   BMI (Calculated) 38.7       Nutrition Therapy Plan and Nutrition Goals:     Nutrition Therapy & Goals - 03/06/15 0900    Nutrition Therapy   Diet Ms Burkes prefers not to meet with the dietitian, although her goal to loss weight is 55lbs; she knows able mutrition and what to do.      Nutrition Discharge: Rate Your Plate Scores:   Psychosocial: Target Goals: Acknowledge presence or absence of depression, maximize coping skills, provide positive support system. Participant is able to verbalize types  and ability to use techniques and skills needed for reducing stress and depression.  Initial Review & Psychosocial Screening:     Initial Psych Review & Screening - 03/06/15 0900    Initial Review   Current issues with Current Depression   Family Dynamics   Good Support System? Yes  Good support from her husband and 2 sons.   Comments Ms Nesmith is dealing with her parent's health - they are both in their 66's with serious health concerns, and she is the only child. She also has depression from her medical conserns.   Barriers   Psychosocial barriers to participate in program The patient should benefit from training in stress management and relaxation.   Screening Interventions   Interventions Encouraged to exercise  Ms Zender meets with a counselor weekly.      Quality of Life Scores:     Quality of Life - 03/06/15 0900    Quality of Life Scores   Health/Function Pre 7.69 %   Socioeconomic Pre 27 %   Psych/Spiritual Pre 12.14 %   Family Pre 10.8 %   GLOBAL Pre 12.47 %      PHQ-9:     Recent Review Flowsheet Data    Depression screen [facility] 2/9 05/11/2015 03/06/2015   Decreased Interest 1 1   Down, Depressed, Hopeless 1 2   PHQ - 2 Score 2 3   Altered sleeping 0 1   Tired, decreased energy 2 1   Change in appetite 0 1   Feeling bad or failure about yourself  1 2   Trouble concentrating 0 1   Moving slowly or fidgety/restless 0 0    Suicidal thoughts 0 0   PHQ-9 Score 5 9   Difficult doing work/chores Very difficult Somewhat difficult      Psychosocial Evaluation and Intervention:     Psychosocial Evaluation - 03/12/15 1232    Psychosocial Evaluation & Interventions   Interventions Stress management education;Relaxation education;Encouraged to exercise with the program and follow exercise prescription   Comments Counselor met with Ms. Easler for initial psychosocial evaluation.  She is a 69 year old who came into this program as a result of breathing problems and an "undiagnosed cough" that she has struggled with for quite some time.  Ms. Brazzel has a strong support  system with a spouse of 48 years and is actively involved in her faith community.  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  Ms. Fanton states she has struggled with depression for the past (3) years subsequent to caring for her sick parents long distance (as they live in [place], and refuse to move to [facility]).  Ms. Desaulniers has been seeing a psychologist/counselor to support her through this time as well as taking medication as prescribed.  She reports stress from caring from her parents, traveling back and forth to help them in [place], and her own medical issues.  Her goals for this program are to increase her energy and ability to breathe better and possibly lose some weight in the process.     Continued Psychosocial Services Needed Yes  Ms. Dermody will benefit from the psychoeducational components of this program, especially stress management and depression.  She also will benefit from the relaxation components as well as seeing the dietician for help with her weight loss goals.  Psychosocial Re-Evaluation:     Psychosocial Re-Evaluation      03/26/15 1110 04/25/15 1024         Psychosocial  Re-Evaluation   Comments Ms. Pates has been out of class for several weeks and returned today.  Met with counselor briefly reporting her father had passed away in [place] and she has been there taking care of the affairs.  She stated she was doing "okay" currently and made it through Father's Day yesterday.  Counselor offered supportive services and will follow with Ms. Heeren as needed.  Follow up with Ms. Comella today reporting increased energy and strength lately as a result of this class.  She also has noticed being able to get out of the bed and so other daily living skills without assistance recently as well.  Ms. Ospina reports she saw her PCP yesterday and there is a concern about her sugar levels that he plans to monitor closely over the next few months and tasked Ms. B with making appropriate adjustments to bring this down.  Counselor discussed this with Ms. B and she has a plan for addressing this issue in order to be healthier and avoid more medications.  Counselor will continue to follow with Ms. B as needed.         Education: Education Goals: Education classes will be provided on a weekly basis, covering required topics. Participant will state understanding/return demonstration of topics presented.  Learning Barriers/Preferences:   Education Topics: Initial Evaluation Education: - Verbal, written and demonstration of respiratory meds, RPE/PD scales, oximetry and breathing techniques. Instruction on use of nebulizers and MDIs: cleaning and proper use, rinsing mouth with steroid doses and importance of monitoring MDI activations.          Pulmonary Rehab from 05/18/2015 in [place]   Date  03/06/15   Educator  LB   Instruction Review Code  2- meets goals/outcomes      General Nutrition Guidelines/Fats and Fiber: -Group instruction provided by verbal, written material, models and posters to present the general guidelines for heart healthy  nutrition. Gives an explanation and review of dietary fats and fiber.      Pulmonary Rehab from 05/18/2015 in [place]   Date  04/02/15   Educator  CR   Instruction Review Code  2- meets goals/outcomes      Controlling Sodium/Reading Food Labels: -Group verbal and written material supporting the discussion of sodium use in heart healthy nutrition. Review and explanation with models, verbal and written materials for utilization of the food label.   Exercise Physiology & Risk Factors: - Group verbal and written instruction with models to review the exercise physiology of the cardiovascular system and associated critical values. Details cardiovascular disease risk factors and the goals associated with each risk factor.   Aerobic Exercise & Resistance Training: - Gives group verbal and written discussion on the health impact of inactivity. On the components of aerobic and resistive training programs and the benefits of this training and how to safely progress through these programs.      Pulmonary Rehab from 05/18/2015 in [place]   Date  04/11/15   Educator  SW   Instruction Review Code  2- meets goals/outcomes      Flexibility, Balance, General Exercise Guidelines: - Provides group verbal and written instruction on the benefits of flexibility and balance training programs. Provides general exercise guidelines with specific guidelines to those with  heart or lung disease. Demonstration and skill practice provided.   Stress Management: - Provides group verbal and written instruction about the health risks of elevated stress, cause of high stress, and healthy ways to reduce stress.   Depression: - Provides group verbal and written instruction on the correlation between heart/lung disease and depressed mood, treatment options, and the stigmas associated with seeking treatment.      Pulmonary Rehab from 05/18/2015  in [place]   Date  05/02/15   Educator  [facility]   Instruction Review Code  2- meets goals/outcomes      Exercise & Equipment Safety: - Individual verbal instruction and demonstration of equipment use and safety with use of the equipment.      Pulmonary Rehab from 05/18/2015 in [place]   Date  03/09/15   Educator  LB   Instruction Review Code  2- meets goals/outcomes      Infection Prevention: - Provides verbal and written material to individual with discussion of infection control including proper hand washing and proper equipment cleaning during exercise session.      Pulmonary Rehab from 05/18/2015 in [place]   Date  03/06/15   Educator  LB   Instruction Review Code  2- meets goals/outcomes      Falls Prevention: - Provides verbal and written material to individual with discussion of falls prevention and safety.      Pulmonary Rehab from 05/18/2015 in [place]   Date  03/06/15   Educator  LB   Instruction Review Code  2- meets goals/outcomes      Diabetes: - Individual verbal and written instruction to review signs/symptoms of diabetes, desired ranges of glucose level fasting, after meals and with exercise. Advice that pre and post exercise glucose checks will be done for 3 sessions at entry of program.   Chronic Lung Diseases: - Group verbal and written instruction to review new updates, new respiratory medications, new advancements in procedures and treatments. Provide informative websites and "800" numbers of self-education.      Pulmonary Rehab from 05/18/2015 in [place]   Date  05/14/15   Educator  LB   Instruction Review Code  2- meets goals/outcomes      Lung Procedures: - Group verbal and written instruction to describe testing methods done to diagnose lung disease.  Review the outcome of test results. Describe the treatment choices: Pulmonary Function Tests, ABGs and oximetry.      Pulmonary Rehab from 05/18/2015 in [place]   Date  05/11/15   Educator  Frederich Cha, RRT   Instruction Review Code  2- meets goals/outcomes      Energy Conservation: - Provide group verbal and written instruction for methods to conserve energy, plan and organize activities. Instruct on pacing techniques, use of adaptive equipment and posture/positioning to relieve shortness of breath.   Triggers: - Group verbal and written instruction to review types of environmental controls: home humidity, furnaces, filters, dust mite/pet prevention, HEPA vacuums. To discuss weather changes, air quality and the benefits of nasal washing.      Pulmonary Rehab from 05/18/2015 in [place]   Date  04/16/15   Educator  LB   Instruction Review Code  2- meets goals/outcomes      Exacerbations: - Group verbal and written instruction to provide: warning signs, infection symptoms,  calling MD promptly, preventive modes, and value of vaccinations. Review: effective airway clearance, coughing and/or vibration techniques. Create an Sports administrator.      Pulmonary Rehab from 05/18/2015 in [place]   Date  03/12/15   Educator  LB   Instruction Review Code  2- meets goals/outcomes      Oxygen: - Individual and group verbal and written instruction on oxygen therapy. Includes supplement oxygen, available portable oxygen systems, continuous and intermittent flow rates, oxygen safety, concentrators, and Medicare reimbursement for oxygen.   Respiratory Medications: - Group verbal and written instruction to review medications for lung disease. Drug class, frequency, complications, importance of spacers, rinsing mouth after steroid MDI's, and proper cleaning methods for nebulizers.       Pulmonary Rehab from 05/18/2015 in [place]   Date  03/06/15   Educator  LB   Instruction Review Code  2- meets goals/outcomes      AED/CPR: - Group verbal and written instruction with the use of models to demonstrate the basic use of the AED with the basic ABC's of resuscitation.      Pulmonary Rehab from 05/18/2015 in [place]   Date  05/18/15   Educator  C. Enterkin,RN   Instruction Review Code  2- meets goals/outcomes      Breathing Retraining: - Provides individuals verbal and written instruction on purpose, frequency, and proper technique of diaphragmatic breathing and pursed-lipped breathing. Applies individual practice skills.      Pulmonary Rehab from 05/18/2015 in [place]   Date  03/06/15   Educator  LB [03/09/2015 Reviewed PLB and encouraged to use with exercise]   Instruction Review Code  2- meets goals/outcomes      Anatomy and Physiology of the Lungs: - Group verbal and written instruction with the use of models to provide basic lung anatomy and physiology related to function, structure and complications of lung disease.      Pulmonary Rehab from 05/18/2015 in [place]   Date  03/30/15   Educator  [place]   Instruction Review Code  2- meets goals/outcomes      Heart Failure: - Group verbal and written instruction on the basics of heart failure: signs/symptoms, treatments, explanation of ejection fraction, enlarged heart and cardiomyopathy.      Pulmonary Rehab from 05/18/2015 in [place]   Date  04/27/15   Educator  Frederich Cha, RT   Instruction Review Code  2- meets goals/outcomes      Sleep Apnea: - Individual verbal and written instruction to review Obstructive Sleep Apnea. Review of risk factors, methods for diagnosing and types of masks and machines for  OSA.   Anxiety: - Provides group, verbal and written instruction on the correlation between heart/lung disease and anxiety, treatment options, and management of anxiety.   Relaxation: - Provides group, verbal and written instruction about the benefits of relaxation for patients with heart/lung disease. Also provides patients with examples of relaxation techniques.   Knowledge Questionnaire Score:     Knowledge Questionnaire Score - 03/06/15 0900    Knowledge Questionnaire Score   Pre Score -3      Personal Goals and Risk Factors at Admission:     Personal Goals and Risk Factors at Admission - 03/06/15 0900    Personal Goals and Risk Factors on Admission    Weight Management Yes   Intervention  Learn and follow the exercise and diet guidelines while in the program. Utilize the nutrition and education classes to help gain knowledge of the diet and exercise expectations in the program   Increase Aerobic Exercise and Physical Activity Yes   Intervention While in program, learn and follow the exercise prescription taught. Start at a low level workload and increase workload after able to maintain previous level for 30 minutes. Increase time before increasing intensity.   Understand more about Heart/Pulmonary Disease. Yes   Intervention While in program utilize professionals for any questions, and attend the education sessions. Great websites to use are www.americanheart.org or www.lung.org for reliable information.   Improve shortness of breath with ADL's Yes   Intervention While in program, learn and follow the exercise prescription taught. Start at a low level workload and increase workload ad advised by the exercise physiologist. Increase time before increasing intensity.   Develop more efficient breathing techniques such as purse lipped breathing and diaphragmatic breathing; and practicing self-pacing with activity Yes   Intervention While in program, learn and utilize the specific  breathing techniques taught to you. Continue to practice and use the techniques as needed.   Increase knowledge of respiratory medications and ability to use respiratory devices properly.  Yes   Intervention While in program learn and demonstrate appropriate use of your oxygen therapy by increasing flow with exertion, manage oxygen tank operation, including continuous and intermittent flow.  Understanding oxygen is a drug ordered by your physician.   Hypertension Yes   Goal Participant will see blood pressure controlled within the values of 140/39mm/Hg or within value directed by their physician.   Intervention Provide nutrition & aerobic exercise along with prescribed medications to achieve BP 140/90 or less.   Stress Yes   Goal To meet with psychosocial counselor for stress and relaxation information and guidance. To state understanding of performing relaxation techniques and or identifying personal stressors.   Intervention Provide education on types of stress, identifiying stressors, and ways to cope with stress. Provide demonstration and active practice of relaxation techniques.      Personal Goals and Risk Factors Review:      Goals and Risk Factor Review      04/02/15 1000 04/04/15 1000 04/11/15 1034 04/16/15 1000 04/25/15 1000   Weight Management   Goals Progress/Improvement seen   No     Comments   Has not seen much progress in this area, but understands the weightloss process and that it takes time and consistency.      Increase Aerobic Exercise and Physical Activity   Goals Progress/Improvement seen   Yes Yes  Yes   Comments  Ms Borntreger on her own initiative increased the level on the XR to L3. She did reduce her weights to 1lb due to left shoulder pain from the 2lbs on Monday. Has felt a large and noticeable difference in her stamina and strength  Ms Taliercio states that [facility] has improved her stamina and activites at home. She states she can get up fom a chair better and can step  out of the shower with less difficulty.   Understand more about Heart/Pulmonary Disease   Goals Progress/Improvement seen    Yes     Comments   Is more comfortable managing her disease. Has learned a lot in the educational sessions and from talking with staff and other patients     Improve shortness of breath with ADL's   Goals Progress/Improvement seen    No  Comments   Has not seen a noticeable difference in this area     Breathing Techniques   Goals Progress/Improvement seen  Yes  Yes     Comments Observing Ms Chrisley using PLB during her exercise goals; she states she finds it helpful with activities at home as well.  Has seen a noticeable difference in how effective the breathing techniques are and uses them on her own     Increase knowledge of respiratory medications   Goals Progress/Improvement seen     Yes    Comments    Ms Degraffenreid has been taking her Advair only in the morning and not in the evening; she was taking her albuterol in the evening instead; instructed her to take the Advair twice a day and then she may not even need the Albuterol.      04/30/15 1000 05/02/15 1105 05/14/15 1000 05/16/15 1000     Weight Management   Goals Progress/Improvement seen    Yes    Comments    Ms Gonet did meet with the dietitian for consult on her diabetes. She has one more follow-up appointment.    Increase Aerobic Exercise and Physical Activity   Goals Progress/Improvement seen  Yes Yes      Comments Ms Hewson has improved her Mid 75mwd by 365ft and states she has improved stamina and energy. becoming stronger, able to ambulate more freely without cane.      Understand more about Heart/Pulmonary Disease   Goals Progress/Improvement seen   Yes      Comments  does not       Improve shortness of breath with ADL's   Goals Progress/Improvement seen   Yes      Comments  is making gains, however, prolonged hot weather is causing more problems lately for her.      Breathing Techniques   Goals  Progress/Improvement seen   Yes Yes     Comments   Using her PLB with her exercise; she has improved considerately with her walk into [facility] and after LW.     Increase knowledge of respiratory medications   Goals Progress/Improvement seen    Yes     Comments   Ms Cortese asked for a new aerochamber for her Albuterol and Advair MDI's - she is aware of the chambers benefits.        Personal Goals Discharge:    Comments: 30 Day Review

## 2015-05-23 DIAGNOSIS — J45909 Unspecified asthma, uncomplicated: Secondary | ICD-10-CM

## 2015-05-23 DIAGNOSIS — J4520 Mild intermittent asthma, uncomplicated: Secondary | ICD-10-CM | POA: Diagnosis not present

## 2015-05-23 NOTE — Progress Notes (Signed)
Daily Session Note  Patient Details  Name: HERMINE FERIA MRN: [id number] Date of Birth: [birth year] Referring Provider:  Minna Merritts, MD  Encounter Date: 05/23/2015  Check In:     Session Check In - 05/23/15 1031    Check-In   Staff Present Lestine Box BS, ACSM EP-C, Exercise Physiologist;Laureen Janell Quiet, RRT, Respiratory Therapist;Other   ER physicians immediately available to respond to emergencies [facility] immediately available ER MD   Physician(s) gayle and schaevit   Medication changes reported     No   Fall or balance concerns reported    No   Warm-up and Cool-down Performed on first and last piece of equipment   VAD Patient? No   Pain Assessment   Currently in Pain? No/denies         Goals Met:  Proper associated with RPD/PD & O2 Sat Exercise tolerated well No report of cardiac concerns or symptoms Strength training completed today  Goals Unmet:  Not Applicable  Goals Comments: Ms Amero was given information on [place] - she has 9 more sessions.   Dr. Emily Filbert is Medical Director for [facility] and [facility].

## 2015-05-25 ENCOUNTER — Encounter: Payer: Medicare PPO | Admitting: *Deleted

## 2015-05-25 DIAGNOSIS — J45909 Unspecified asthma, uncomplicated: Secondary | ICD-10-CM

## 2015-05-25 DIAGNOSIS — J4520 Mild intermittent asthma, uncomplicated: Secondary | ICD-10-CM | POA: Diagnosis not present

## 2015-05-25 DIAGNOSIS — R05 Cough: Secondary | ICD-10-CM

## 2015-05-25 DIAGNOSIS — R053 Chronic cough: Secondary | ICD-10-CM

## 2015-05-25 NOTE — Progress Notes (Signed)
Daily Session Note  Patient Details  Name: Caitlin Leonard MRN: [id number] Date of Birth: [birth year] Referring Provider:  Minna Merritts, MD  Encounter Date: 05/25/2015  Check In:     Session Check In - 05/25/15 1047    Check-In   Staff Present Nyoka Cowden RN;Carroll Enterkin RN, BSN;Lorriane Dehart Blanch Media RRT, RCP Respiratory Therapist   ER physicians immediately available to respond to emergencies [facility] immediately available ER MD   Physician(s) Dr. Jacqualine Code and Dr. Karma Greaser   Medication changes reported     No   Fall or balance concerns reported    No   Warm-up and Cool-down Performed on first and last piece of equipment   VAD Patient? No   Pain Assessment   Currently in Pain? No/denies           Exercise Prescription Changes - 05/25/15 1000    Exercise Review   Progression Yes   REL-XR   Level 6   Watts 65   Minutes 15      Goals Met:  Proper associated with RPD/PD & O2 Sat Independence with exercise equipment Using PLB without cueing & demonstrates good technique Exercise tolerated well Personal goals reviewed Strength training completed today  Goals Unmet:  Not Applicable  Goals Comments:Increase on XR today.  15 minutes; L6; 65 watts.   Dr. Emily Filbert is Medical Director for [place] and [facility].

## 2015-05-28 DIAGNOSIS — J4520 Mild intermittent asthma, uncomplicated: Secondary | ICD-10-CM | POA: Diagnosis not present

## 2015-05-28 DIAGNOSIS — J45909 Unspecified asthma, uncomplicated: Secondary | ICD-10-CM

## 2015-05-28 NOTE — Progress Notes (Signed)
Daily Session Note  Patient Details  Name: Caitlin Leonard MRN: [id number] Date of Birth: 06/17/1946 Referring Provider:  Minna Merritts, MD  Encounter Date: 05/28/2015  Check In:     Session Check In - 05/28/15 1039    Check-In   Staff Present Caitlin Leonard BS, ACSM EP-C, Exercise Physiologist;Caitlin Leonard, RRT, Respiratory Therapist;Caitlin Leonard, ACSM CEP Exercise Physiologist   ER physicians immediately available to respond to emergencies [facility] immediately available ER MD   Physician(s) Caitlin Leonard and Caitlin Leonard   Medication changes reported     No   Fall or balance concerns reported    No   Warm-up and Cool-down Performed on first and last piece of equipment   VAD Patient? No   Pain Assessment   Currently in Pain? No/denies         Goals Met:  Proper associated with RPD/PD & O2 Sat Exercise tolerated well No report of cardiac concerns or symptoms Strength training completed today  Goals Unmet:  Not Applicable  Goals Comments: [name] is still undecided about her exercise plans after [facility].   Dr. Emily Leonard is Medical Director for [facility] and [facility].

## 2015-05-30 ENCOUNTER — Encounter: Payer: Self-pay | Admitting: Internal Medicine

## 2015-05-30 ENCOUNTER — Encounter: Payer: Medicare PPO | Admitting: *Deleted

## 2015-05-30 DIAGNOSIS — J4520 Mild intermittent asthma, uncomplicated: Secondary | ICD-10-CM | POA: Diagnosis not present

## 2015-05-30 DIAGNOSIS — J45909 Unspecified asthma, uncomplicated: Secondary | ICD-10-CM

## 2015-05-30 NOTE — Progress Notes (Signed)
Daily Session Note  Patient Details  Name: Caitlin Leonard MRN: [id number] Date of Birth: 09/22/1946 Referring Provider:  Minna Merritts, MD  Encounter Date: 05/30/2015  Check In:     Session Check In - 05/30/15 1018    Check-In   Staff Present Laureen Owens Shark BS, RRT, Respiratory Therapist;Jettson Crable [place] MS, ACSM CEP Exercise Physiologist;Steven Way BS, ACSM EP-C, Exercise Physiologist   ER physicians immediately available to respond to emergencies [facility] immediately available ER MD   Physician(s) Joni Fears and Lord   Medication changes reported     No   Fall or balance concerns reported    No   Warm-up and Cool-down Performed on first and last piece of equipment   VAD Patient? No   Pain Assessment   Currently in Pain? No/denies   Multiple Pain Sites No           Exercise Prescription Changes - 05/30/15 1000    Exercise Review   Progression Yes   Response to Exercise   Duration Progress to 50 minutes of aerobic without signs/symptoms of physical distress   Intensity THRR unchanged   Progression Continue progressive overload as per policy without signs/symptoms or physical distress.   Resistance Training   Training Prescription Yes   Weight 2   Reps 10-15   Interval Training   Interval Training No   NuStep   Level 5   Watts 60   Minutes 15   Arm Ergometer   Level 4   Watts 18   Minutes 12   REL-XR   Level 6   Watts 80   Minutes 15      Goals Met:  Proper associated with RPD/PD & O2 Sat Independence with exercise equipment Using PLB without cueing & demonstrates good technique Exercise tolerated well Personal goals reviewed Strength training completed today  Goals Unmet:  Not Applicable  Goals Comments: Reviewed individualized exercise prescription and made increases per departmental policy. Exercise increases were discussed with the patient and they were able to perform the new work loads without issue (no signs or symptoms).   Discussed the  [employer] program with [place]. She is on the waiting list for two classes and hopes to graduate from pulmonary at the end of September and start in [employer] immediately after.     Dr. Emily Filbert is Medical Director for [facility] and [facility].

## 2015-05-31 ENCOUNTER — Encounter: Payer: Medicare PPO | Admitting: Dietician

## 2015-05-31 VITALS — BP 124/78 | Ht 68.0 in | Wt 247.9 lb

## 2015-05-31 DIAGNOSIS — J4520 Mild intermittent asthma, uncomplicated: Secondary | ICD-10-CM | POA: Diagnosis not present

## 2015-05-31 DIAGNOSIS — E119 Type 2 diabetes mellitus without complications: Secondary | ICD-10-CM

## 2015-05-31 NOTE — Progress Notes (Signed)
Diabetes Self-Management Education  Visit Type:  Follow-up  Appt. Start Time: 1030 Appt. End Time: 1115  05/31/2015  [name], identified by name and date of birth, is a 69 y.o. female with a diagnosis of Diabetes: Type 2.   ASSESSMENT  Blood pressure 124/78, height 5\' 8"  (1.727 m), weight 247 lb 14.4 oz (112.447 kg). Body mass index is 37.7 kg/(m^2).       Diabetes Self-Management Education - 98/26/41 5830    Complications   Fasting Blood glucose range (mg/dL) 70-129  98-110   Postprandial Blood glucose range (mg/dL) 70-129  110-125   Have you had a dilated eye exam in the past 12 months? Yes   Have you had a dental exam in the past 12 months? Yes   Are you checking your feet? Yes   How many days per week are you checking your feet? 7   Dietary Intake   Breakfast cereal, no fruit, 1x a week eggs and bacon, 1x a week apple crisp   Lunch often not hungry, drinks protein shake after [facility], or [place] sandwitch on whole wheat with fruit.    Snack (afternoon) yogurt   Dinner chicken from chick fila, veg plate, or phyllo dough with chicken and veg.    Exercise   Exercise Type Light (walking / raking leaves)  [facility] exercise classes   How many days per week to you exercise? 3   How many minutes per day do you exercise? 90   Total minutes per week of exercise 270   Patient Education   Nutrition management  Role of diet in the treatment of diabetes and the relationship between the three main macronutrients and blood glucose level;Meal options for control of blood glucose level and chronic complications.;Other (comment)  calculated energy, carbohydrate and protein needs and provided 1200-1300kcal meal plan   Physical activity and exercise  Role of exercise on diabetes management, blood pressure control and cardiac health.   Psychosocial adjustment Role of stress on diabetes   Outcomes   Program Status Completed      Learning Objective:  Patient will have a greater  understanding of diabetes self-management. Patient education plan is to attend individual and/or group sessions per assessed needs and concerns.   Plan:   Patient Instructions   Include at least 2 servings (30g) of carbohydrate with each meal. Have beans with a vegetable plate for healthy carb and protein.  Keep cereal serving to 1 cup or less, then OK to have a small serving of fruit with it. Nuts would be a good protein source.  Try frozen vegetables, or keep raw, cut-up veggies on hand to include with a portion of chicken or meat from a restaurant.     Expected Outcomes:  Demonstrated interest in learning. Expect positive outcomes  Education material provided: 1200-1300kcal meal plan with food lists         Ways to increase vegetables and fruits         Quick and Healthy meal ideas  If problems or questions, patient to contact team via:  Phone  Future DSME appointment: -  none, program completed. Encouraged patient to call as needed with any questions or concerns.

## 2015-05-31 NOTE — Patient Instructions (Signed)
   Include at least 2 servings (30g) of carbohydrate with each meal. Have beans with a vegetable plate for healthy carb and protein.  Keep cereal serving to 1 cup or less, then OK to have a small serving of fruit with it. Nuts would be a good protein source.  Try frozen vegetables, or keep raw, cut-up veggies on hand to include with a portion of chicken or meat from a restaurant.

## 2015-06-01 ENCOUNTER — Encounter: Payer: Medicare PPO | Admitting: *Deleted

## 2015-06-01 VITALS — Ht 68.0 in | Wt 248.0 lb

## 2015-06-01 DIAGNOSIS — J4520 Mild intermittent asthma, uncomplicated: Secondary | ICD-10-CM | POA: Diagnosis not present

## 2015-06-01 NOTE — Progress Notes (Signed)
Daily Session Note  Patient Details  Name: Caitlin Leonard MRN: [id number] Date of Birth: Sep 08, 1946 Referring Provider:  Minna Merritts, MD  Encounter Date: 06/01/2015  Check In:     Session Check In - 06/01/15 1112    Check-In   Staff Present Gerlene Burdock RN, BSN;Susanne Bice RN, BSN, CCRP;Stacey Blanch Media RRT, RCP Respiratory Therapist;Renee [facility] MS, ACSM CEP Exercise Physiologist   ER physicians immediately available to respond to emergencies [facility] immediately available ER MD   Physician(s) Dr. Jimmye Norman and Dr. Thomasene Lot    Medication changes reported     No   Fall or balance concerns reported    No   Warm-up and Cool-down Performed on first and last piece of equipment   VAD Patient? No   Pain Assessment   Currently in Pain? No/denies         Goals Met:  Proper associated with RPD/PD & O2 Sat Independence with exercise equipment Exercise tolerated well Personal goals reviewed Strength training completed today  Goals Unmet:  Not Applicable  Goals Comments: Did 6 minute post test walk since she is almost done with [facility].    Dr. Emily Filbert is Medical Director for [place] and [facility].

## 2015-06-04 ENCOUNTER — Encounter: Payer: Medicare PPO | Admitting: *Deleted

## 2015-06-04 DIAGNOSIS — J4520 Mild intermittent asthma, uncomplicated: Secondary | ICD-10-CM | POA: Diagnosis not present

## 2015-06-04 DIAGNOSIS — J45909 Unspecified asthma, uncomplicated: Secondary | ICD-10-CM

## 2015-06-04 NOTE — Progress Notes (Signed)
Daily Session Note  Patient Details  Name: Caitlin Leonard MRN: [id number] Date of Birth: [birth year] Referring Provider:  Minna Merritts, MD  Encounter Date: 06/04/2015  Check In:     Session Check In - 06/04/15 1028    Check-In   Staff Present Laureen Owens Shark BS, RRT, Respiratory Therapist;Janaia Kozel [facility] [place] MS, ACSM CEP Exercise Physiologist;Kelly Alfonso Patten, ACSM CEP Exercise Physiologist;Steven Way BS, ACSM EP-C, Exercise Physiologist   ER physicians immediately available to respond to emergencies [facility] immediately available ER MD   Physician(s) Joni Fears and Marcelene Butte    Medication changes reported     No   Fall or balance concerns reported    No   Warm-up and Cool-down Performed on first and last piece of equipment   VAD Patient? No   Pain Assessment   Currently in Pain? No/denies   Multiple Pain Sites No         Goals Met:  Proper associated with RPD/PD & O2 Sat Independence with exercise equipment Using PLB without cueing & demonstrates good technique Exercise tolerated well Strength training completed today  Goals Unmet:  Not Applicable  Goals Comments: Shanise's MD (Dr. Kary Kos) faxed back approval for her to start in the FF Exercise Program following her graduation from [employer].   Dr. Emily Filbert is Medical Director for [place] and [facility].

## 2015-06-05 ENCOUNTER — Encounter: Payer: Self-pay | Admitting: Internal Medicine

## 2015-06-05 DIAGNOSIS — R05 Cough: Secondary | ICD-10-CM

## 2015-06-05 DIAGNOSIS — J45909 Unspecified asthma, uncomplicated: Secondary | ICD-10-CM

## 2015-06-05 DIAGNOSIS — R053 Chronic cough: Secondary | ICD-10-CM

## 2015-06-06 ENCOUNTER — Encounter: Payer: Self-pay | Admitting: Cardiovascular Disease

## 2015-06-06 ENCOUNTER — Encounter: Payer: Medicare PPO | Admitting: *Deleted

## 2015-06-06 DIAGNOSIS — J4520 Mild intermittent asthma, uncomplicated: Secondary | ICD-10-CM | POA: Diagnosis not present

## 2015-06-06 DIAGNOSIS — J45909 Unspecified asthma, uncomplicated: Secondary | ICD-10-CM

## 2015-06-06 NOTE — Progress Notes (Signed)
Daily Session Note  Patient Details  Name: IYSIS GERMAIN MRN: [id number] Date of Birth: 1946/07/13 Referring Provider:  Minna Merritts, MD  Encounter Date: 06/06/2015  Check In:     Session Check In - 06/06/15 1045    Check-In   Staff Present Laureen Owens Shark BS, RRT, Respiratory Therapist;Nashly Olsson [place] MS, ACSM CEP Exercise Physiologist;Steven Way BS, ACSM EP-C, Exercise Physiologist   ER physicians immediately available to respond to emergencies [facility] immediately available ER MD   Medication changes reported     No   Fall or balance concerns reported    No   Warm-up and Cool-down Performed on first and last piece of equipment   VAD Patient? No   Pain Assessment   Currently in Pain? No/denies   Multiple Pain Sites No         Goals Met:  Proper associated with RPD/PD & O2 Sat Independence with exercise equipment Using PLB without cueing & demonstrates good technique Exercise tolerated well Strength training completed today  Goals Unmet:  Not Applicable  Goals Comments: Ms Keller can tell a difference with air quality with the fall approaching. She has had increased coughing. I suggested a mask for outings, even walking to her car.   Dr. Emily Filbert is Medical Director for [place] and [facility].

## 2015-06-08 ENCOUNTER — Encounter: Payer: Medicare PPO | Attending: Cardiovascular Disease | Admitting: *Deleted

## 2015-06-08 DIAGNOSIS — J4520 Mild intermittent asthma, uncomplicated: Secondary | ICD-10-CM | POA: Insufficient documentation

## 2015-06-08 DIAGNOSIS — J45909 Unspecified asthma, uncomplicated: Secondary | ICD-10-CM

## 2015-06-08 NOTE — Progress Notes (Signed)
Daily Session Note  Patient Details  Name: Caitlin Leonard MRN: [id number] Date of Birth: 12/10/1945 Referring Provider:  Minna Merritts, MD  Encounter Date: 06/08/2015  Check In:     Session Check In - 06/08/15 1026    Check-In   Staff Present Nyoka Cowden RN;Renee [place] MS, ACSM CEP Exercise Physiologist;Stacey Blanch Media RRT, RCP Respiratory Therapist   ER physicians immediately available to respond to emergencies [facility] immediately available ER MD   Physician(s) Dr. Jacqualine Code and Dr. Edd Fabian    Medication changes reported     No   Fall or balance concerns reported    No   Warm-up and Cool-down Performed on first and last piece of equipment   VAD Patient? No   Pain Assessment   Currently in Pain? No/denies         Goals Met:  Proper associated with RPD/PD & O2 Sat Independence with exercise equipment Exercise tolerated well Strength training completed today  Goals Unmet:  Not Applicable  Goals Comments: Discussed Krystal's place on the waiting list for [place]. Zoraida is willing to try the [facility] FF class on Tues/Thurs. She has to go out of town following her graduation from pulmonary rehab but will call as soon as she returns to get started immediately in the FF program.    Dr. Emily Filbert is Medical Director for [place] and [facility].

## 2015-06-13 ENCOUNTER — Encounter: Payer: Medicare PPO | Admitting: *Deleted

## 2015-06-13 ENCOUNTER — Encounter: Payer: Self-pay | Admitting: Internal Medicine

## 2015-06-13 DIAGNOSIS — R053 Chronic cough: Secondary | ICD-10-CM

## 2015-06-13 DIAGNOSIS — J45909 Unspecified asthma, uncomplicated: Secondary | ICD-10-CM

## 2015-06-13 DIAGNOSIS — J4520 Mild intermittent asthma, uncomplicated: Secondary | ICD-10-CM | POA: Diagnosis not present

## 2015-06-13 DIAGNOSIS — R05 Cough: Secondary | ICD-10-CM

## 2015-06-13 NOTE — Patient Instructions (Signed)
pul Discharge Instructions  Patient Details  Name: Caitlin Leonard MRN: [id number] Date of Birth: Sep 03, 1946 Referring Provider:  Dr Ida Rogue   Number of Visits: 36 Reason for Discharge:  Patient reached a stable level of exercise. Patient independent in their exercise.  Smoking History:  History  Smoking status   Never Smoker   Smokeless tobacco   Never Used    Diagnosis:  Chronic cough  Asthma, chronic, unspecified asthma severity, uncomplicated  Initial Exercise Prescription:     Initial Exercise Prescription - 03/09/15 1300    NuStep   Level 2   Watts 40   Minutes 10   REL-XR   Level 3   Watts 20   Minutes 10      Discharge Exercise Prescription (Final Exercise Prescription Changes):     Exercise Prescription Changes - 06/13/15 1500    Exercise Review   Progression Yes   Response to Exercise   Blood Pressure (Admit) 124/74 mmHg   Blood Pressure (Exercise) 144/74 mmHg   Blood Pressure (Exit) 128/72 mmHg   Heart Rate (Admit) 89 bpm   Heart Rate (Exercise) 117 bpm   Heart Rate (Exit) 96 bpm   Oxygen Saturation (Admit) 93 %   Oxygen Saturation (Exercise) 94 %   Oxygen Saturation (Exit) 94 %   Rating of Perceived Exertion (Exercise) 13   Perceived Dyspnea (Exercise) 3   Frequency Add 2 additional days to program exercise sessions.   Duration Progress to 50 minutes of aerobic without signs/symptoms of physical distress   Intensity Rest + 30   Progression Continue progressive overload as per policy without signs/symptoms or physical distress.   Resistance Training   Training Prescription Yes   Weight 2   Reps 10-12   NuStep   Level 5  BioStep   Watts 45   Minutes 15   Arm Ergometer   Level 4   Watts 18   Minutes 15   REL-XR   Level 7   Watts 85   Minutes 15      Functional Capacity:     6 Minute Walk      03/06/15 1333 04/27/15 1111 06/01/15 1009   6 Minute Walk   Phase Initial Mid Program Discharge   Distance 805 feet 1150  feet 1230 feet   Distance % Change  30 % 53 %   Walk Time 6 minutes 6 minutes 6 minutes   Resting HR 89 bpm 79 bpm 79 bpm   Resting BP 128/86 mmHg 120/80 mmHg 128/68 mmHg   Max Ex. HR 133 bpm 127 bpm 120 bpm   Max Ex. BP 152/80 mmHg 152/90 mmHg 142/74 mmHg   RPE 15 15 13    Perceived Dyspnea  4 4 3    Symptoms No No No      Quality of Life:     Quality of Life - 06/04/15 1000    Quality of Life Scores   Health/Function Post 20.56 %   Socioeconomic Post 29.29 %   Psych/Spiritual Post 24.79 %   Family Post 18.4 %   GLOBAL Post 20.56 %      Personal Goals: Goals established at orientation with interventions provided to work toward goal.     Personal Goals and Risk Factors at Admission - 03/06/15 0900    Personal Goals and Risk Factors on Admission    Weight Management Yes   Intervention Learn and follow the exercise and diet guidelines while in the program. Utilize the nutrition and education classes  to help gain knowledge of the diet and exercise expectations in the program   Increase Aerobic Exercise and Physical Activity Yes   Intervention While in program, learn and follow the exercise prescription taught. Start at a low level workload and increase workload after able to maintain previous level for 30 minutes. Increase time before increasing intensity.   Understand more about Heart/Pulmonary Disease. Yes   Intervention While in program utilize professionals for any questions, and attend the education sessions. Great websites to use are www.americanheart.org or www.lung.org for reliable information.   Improve shortness of breath with ADL's Yes   Intervention While in program, learn and follow the exercise prescription taught. Start at a low level workload and increase workload ad advised by the exercise physiologist. Increase time before increasing intensity.   Develop more efficient breathing techniques such as purse lipped breathing and diaphragmatic breathing; and practicing  self-pacing with activity Yes   Intervention While in program, learn and utilize the specific breathing techniques taught to you. Continue to practice and use the techniques as needed.   Increase knowledge of respiratory medications and ability to use respiratory devices properly.  Yes   Intervention While in program learn and demonstrate appropriate use of your oxygen therapy by increasing flow with exertion, manage oxygen tank operation, including continuous and intermittent flow.  Understanding oxygen is a drug ordered by your physician.   Hypertension Yes   Goal Participant will see blood pressure controlled within the values of 140/54mm/Hg or within value directed by their physician.   Intervention Provide nutrition & aerobic exercise along with prescribed medications to achieve BP 140/90 or less.   Stress Yes   Goal To meet with psychosocial counselor for stress and relaxation information and guidance. To state understanding of performing relaxation techniques and or identifying personal stressors.   Intervention Provide education on types of stress, identifiying stressors, and ways to cope with stress. Provide demonstration and active practice of relaxation techniques.       Personal Goals Discharge:     Personal Goals at Discharge - 06/13/15 1000    Weight Management   Comments Ms Jacquot has meet with the dietitian twice this month for diabetes counciling and has a good understanding of weight management. She has lose 8lbs in [place].   Increase Aerobic Exercise and Physical Activity   Goals Progress/Improvement seen  Yes   Comments Ms Rieves has improved her post 3mwd by 436ft. She is so encouraged by her exercise progress and wants to do all she can to continue it. She plans to join [employer].   Understand more about Heart/Pulmonary Disease   Goals Progress/Improvement seen  Yes   Comments [name] has enjoyed the education and how it relates to managing her disease with daily  living.   Improve shortness of breath with ADL's   Goals Progress/Improvement seen  Yes   Comments Ms Figuero still has shortness of breath with activites at times, but she is more confident in managing it with PLB and pacing.   Increase knowledge of respiratory medications   Goals Progress/Improvement seen  Yes   Comments Ms Raysor has good understanding of her Albuterol and Advair MDIs and her spacer.      Nutrition & Weight - Outcomes:     Pre Biometrics - 03/06/15 1339    Pre Biometrics   Height 5\' 8"  (1.727 m)   Weight 254 lb (115.214 kg)   Waist Circumference 41.5 inches   Hip Circumference 51 inches  Waist to Hip Ratio 0.81 %   BMI (Calculated) 38.7         Post Biometrics - 06/01/15 1010     Post  Biometrics   Height 5\' 8"  (1.727 m)   Weight 248 lb (112.492 kg)   Waist Circumference 41 inches   Hip Circumference 51 inches   Waist to Hip Ratio 0.8 %   BMI (Calculated) 37.8      Nutrition:     Nutrition Therapy & Goals - 03/06/15 0900    Nutrition Therapy   Diet Ms Deen prefers not to meet with the dietitian, although her goal to loss weight is 55lbs; she knows able mutrition and what to do.      Nutrition Discharge:   Education Questionnaire Score:     Knowledge Questionnaire Score - 03/06/15 0900    Knowledge Questionnaire Score   Pre Score -3      Goals reviewed with patient; copy given to patient.

## 2015-06-13 NOTE — Progress Notes (Signed)
Discharge Summary  Patient Details  Name: Caitlin Leonard MRN: [id number] Date of Birth: 09/28/1946 Referring Provider:  Dr Ida Rogue   Number of Visits: 36  Reason for Discharge:  Patient reached a stable level of exercise. Patient independent in their exercise.  Smoking History:  History  Smoking status   Never Smoker   Smokeless tobacco   Never Used    Diagnosis:  Chronic cough  Asthma, chronic, unspecified asthma severity, uncomplicated  ADL UCSD:     ADL UCSD      03/06/15 0900 04/27/15 1247 06/04/15 1000   ADL UCSD   ADL Phase Entry Mid Exit   SOB Score total 44 45 36   Rest 0 3 0   Walk 3 2 1    Stairs 5 5 4    Bath 2 1 1    Dress 2 3 0   Shop 4 3 2       Initial Exercise Prescription:     Initial Exercise Prescription - 03/09/15 1300    NuStep   Level 2   Watts 40   Minutes 10   REL-XR   Level 3   Watts 20   Minutes 10      Discharge Exercise Prescription (Final Exercise Prescription Changes):     Exercise Prescription Changes - 06/13/15 1500    Exercise Review   Progression Yes   Response to Exercise   Blood Pressure (Admit) 124/74 mmHg   Blood Pressure (Exercise) 144/74 mmHg   Blood Pressure (Exit) 128/72 mmHg   Heart Rate (Admit) 89 bpm   Heart Rate (Exercise) 117 bpm   Heart Rate (Exit) 96 bpm   Oxygen Saturation (Admit) 93 %   Oxygen Saturation (Exercise) 94 %   Oxygen Saturation (Exit) 94 %   Rating of Perceived Exertion (Exercise) 13   Perceived Dyspnea (Exercise) 3   Frequency Add 2 additional days to program exercise sessions.   Duration Progress to 50 minutes of aerobic without signs/symptoms of physical distress   Intensity Rest + 30   Progression Continue progressive overload as per policy without signs/symptoms or physical distress.   Resistance Training   Training Prescription Yes   Weight 2   Reps 10-12   NuStep   Level 5  BioStep   Watts 45   Minutes 15   Arm Ergometer   Level 4   Watts 18   Minutes  15   REL-XR   Level 7   Watts 85   Minutes 15      Functional Capacity:     6 Minute Walk      03/06/15 1333 04/27/15 1111 06/01/15 1009   6 Minute Walk   Phase Initial Mid Program Discharge   Distance 805 feet 1150 feet 1230 feet   Distance % Change  30 % 53 %   Walk Time 6 minutes 6 minutes 6 minutes   Resting HR 89 bpm 79 bpm 79 bpm   Resting BP 128/86 mmHg 120/80 mmHg 128/68 mmHg   Max Ex. HR 133 bpm 127 bpm 120 bpm   Max Ex. BP 152/80 mmHg 152/90 mmHg 142/74 mmHg   RPE 15 15 13    Perceived Dyspnea  4 4 3    Symptoms No No No      Psychological, QOL, Others - Outcomes: PHQ 2/9: Depression screen [facility] 2/9 06/04/2015 05/11/2015 03/06/2015  Decreased Interest 1 1 1   Down, Depressed, Hopeless 1 1 2   PHQ - 2 Score 2 2 3   Altered sleeping 0 0  1  Tired, decreased energy 1 2 1   Change in appetite 0 0 1  Feeling bad or failure about yourself  0 1 2  Trouble concentrating 0 0 1  Moving slowly or fidgety/restless 0 0 0  Suicidal thoughts 0 0 0  PHQ-9 Score 3 5 9   Difficult doing work/chores Not difficult at all Very difficult Somewhat difficult    Quality of Life:     Quality of Life - 06/04/15 1000    Quality of Life Scores   Health/Function Post 20.56 %   Socioeconomic Post 29.29 %   Psych/Spiritual Post 24.79 %   Family Post 18.4 %   GLOBAL Post 20.56 %      Personal Goals: Goals established at orientation with interventions provided to work toward goal.     Personal Goals and Risk Factors at Admission - 03/06/15 0900    Personal Goals and Risk Factors on Admission    Weight Management Yes   Intervention Learn and follow the exercise and diet guidelines while in the program. Utilize the nutrition and education classes to help gain knowledge of the diet and exercise expectations in the program   Increase Aerobic Exercise and Physical Activity Yes   Intervention While in program, learn and follow the exercise prescription taught. Start at a low level workload  and increase workload after able to maintain previous level for 30 minutes. Increase time before increasing intensity.   Understand more about Heart/Pulmonary Disease. Yes   Intervention While in program utilize professionals for any questions, and attend the education sessions. Great websites to use are www.americanheart.org or www.lung.org for reliable information.   Improve shortness of breath with ADL's Yes   Intervention While in program, learn and follow the exercise prescription taught. Start at a low level workload and increase workload ad advised by the exercise physiologist. Increase time before increasing intensity.   Develop more efficient breathing techniques such as purse lipped breathing and diaphragmatic breathing; and practicing self-pacing with activity Yes   Intervention While in program, learn and utilize the specific breathing techniques taught to you. Continue to practice and use the techniques as needed.   Increase knowledge of respiratory medications and ability to use respiratory devices properly.  Yes   Intervention While in program learn and demonstrate appropriate use of your oxygen therapy by increasing flow with exertion, manage oxygen tank operation, including continuous and intermittent flow.  Understanding oxygen is a drug ordered by your physician.   Hypertension Yes   Goal Participant will see blood pressure controlled within the values of 140/50mm/Hg or within value directed by their physician.   Intervention Provide nutrition & aerobic exercise along with prescribed medications to achieve BP 140/90 or less.   Stress Yes   Goal To meet with psychosocial counselor for stress and relaxation information and guidance. To state understanding of performing relaxation techniques and or identifying personal stressors.   Intervention Provide education on types of stress, identifiying stressors, and ways to cope with stress. Provide demonstration and active practice of  relaxation techniques.       Personal Goals Discharge:     Personal Goals at Discharge - 06/13/15 1000    Weight Management   Comments Ms Kawamoto has meet with the dietitian twice this month for diabetes counciling and has a good understanding of weight management. She has lose 8lbs in [place].   Increase Aerobic Exercise and Physical Activity   Goals Progress/Improvement seen  Yes   Comments Ms Christians has improved  her post 67mwd by 455ft. She is so encouraged by her exercise progress and wants to do all she can to continue it. She plans to join [employer].   Understand more about Heart/Pulmonary Disease   Goals Progress/Improvement seen  Yes   Comments [name] has enjoyed the education and how it relates to managing her disease with daily living.   Improve shortness of breath with ADL's   Goals Progress/Improvement seen  Yes   Comments Ms Akkerman still has shortness of breath with activites at times, but she is more confident in managing it with PLB and pacing.   Increase knowledge of respiratory medications   Goals Progress/Improvement seen  Yes   Comments Ms Wieland has good understanding of her Albuterol and Advair MDIs and her spacer.      Nutrition & Weight - Outcomes:     Pre Biometrics - 03/06/15 1339    Pre Biometrics   Height 5\' 8"  (1.727 m)   Weight 254 lb (115.214 kg)   Waist Circumference 41.5 inches   Hip Circumference 51 inches   Waist to Hip Ratio 0.81 %   BMI (Calculated) 38.7         Post Biometrics - 06/01/15 1010     Post  Biometrics   Height 5\' 8"  (1.727 m)   Weight 248 lb (112.492 kg)   Waist Circumference 41 inches   Hip Circumference 51 inches   Waist to Hip Ratio 0.8 %   BMI (Calculated) 37.8      Nutrition:     Nutrition Therapy & Goals - 03/06/15 0900    Nutrition Therapy   Diet Ms Zachow prefers not to meet with the dietitian, although her goal to loss weight is 55lbs; she knows able mutrition and what to do.      Nutrition  Discharge:   Education Questionnaire Score:     Knowledge Questionnaire Score - 03/06/15 0900    Knowledge Questionnaire Score   Pre Score -3      Goals reviewed with patient; copy given to patient.

## 2015-06-13 NOTE — Progress Notes (Signed)
Daily Session Note  Patient Details  Name: Caitlin Leonard MRN: [id number] Date of Birth: 05/20/1946 Referring Provider:  Minna Merritts, MD  Encounter Date: 06/13/2015  Check In:     Session Check In - 06/13/15 1048    Check-In   Staff Present Laureen Owens Shark BS, RRT, Respiratory Therapist;Deagan Sevin [place] MS, ACSM CEP Exercise Physiologist;Steven Way BS, ACSM EP-C, Exercise Physiologist   ER physicians immediately available to respond to emergencies [facility] immediately available ER MD   Physician(s) Cinda Quest and Marcelene Butte   Medication changes reported     No   Fall or balance concerns reported    No   Warm-up and Cool-down Performed on first and last piece of equipment   VAD Patient? No   Pain Assessment   Currently in Pain? No/denies   Multiple Pain Sites No           Exercise Prescription Changes - 06/13/15 1000    Exercise Review   Progression Yes   Response to Exercise   Duration Progress to 50 minutes of aerobic without signs/symptoms of physical distress   Intensity THRR unchanged   Progression Continue progressive overload as per policy without signs/symptoms or physical distress.   Resistance Training   Training Prescription Yes   Weight 2   Reps 10-15   Interval Training   Interval Training No   NuStep   Level 5  BioStep   Watts 45   Minutes 15   Arm Ergometer   Level 4   Watts 18   Minutes 12   REL-XR   Level 6   Watts 80   Minutes 15      Goals Met:  Proper associated with RPD/PD & O2 Sat Independence with exercise equipment Using PLB without cueing & demonstrates good technique Exercise tolerated well Personal goals reviewed Strength training completed today  Goals Unmet:  Not Applicable  Goals Comments: Reviewed individualized exercise prescription and made increases per departmental policy. Exercise increases were discussed with the patient and they were able to perform the new work loads without issue (no signs or symptoms).      Dr. Emily Filbert is Medical Director for [facility] and [facility].

## 2015-06-15 ENCOUNTER — Encounter: Payer: Medicare PPO | Admitting: *Deleted

## 2015-06-15 DIAGNOSIS — J45909 Unspecified asthma, uncomplicated: Secondary | ICD-10-CM

## 2015-06-15 DIAGNOSIS — J4520 Mild intermittent asthma, uncomplicated: Secondary | ICD-10-CM | POA: Diagnosis not present

## 2015-06-15 NOTE — Progress Notes (Signed)
Pulmonary Individual Treatment Plan  Patient Details  Name: Caitlin Leonard MRN: [id number] Date of Birth: [birth year] Referring Provider:  Minna Merritts, MD  Initial Encounter Date: 03/06/2015  Visit Diagnosis: Asthma, chronic, unspecified asthma severity, uncomplicated  Patient's Home Medications on Admission:  Current outpatient prescriptions:    albuterol (PROVENTIL HFA;VENTOLIN HFA) 108 (90 BASE) MCG/ACT inhaler, Inhale 1-2 puffs into the lungs every 6 (six) hours as needed for wheezing or shortness of breath., Disp: 1 Inhaler, Rfl: 3   ALPRAZolam (XANAX) 0.5 MG tablet, Take 1 tablet by mouth at bedtime., Disp: , Rfl:    Azelastine HCl 0.15 % SOLN, Place 1 spray into the nose 2 (two) times daily as needed., Disp: 30 mL, Rfl: 1   diltiazem (CARDIZEM CD) 180 MG 24 hr capsule, Take 1 capsule (180 mg total) by mouth daily., Disp: 90 capsule, Rfl: 3   diltiazem (CARDIZEM) 30 MG tablet, TAKE 1 TABLET(30 MG) BY MOUTH THREE TIMES DAILY AS NEEDED, Disp: 90 tablet, Rfl: 3   EPIPEN 2-PAK 0.3 MG/0.3ML SOAJ injection, as needed., Disp: , Rfl:    fluticasone-salmeterol (ADVAIR HFA) 115-21 MCG/ACT inhaler, Inhale 2 puffs into the lungs 2 (two) times daily., Disp: , Rfl:    furosemide (LASIX) 20 MG tablet, Take 20 mg by mouth daily as needed., Disp: , Rfl:    GARCINIA CAMBOGIA-CHROMIUM PO, Take 1 capsule by mouth daily., Disp: , Rfl:    levocetirizine (XYZAL) 5 MG tablet, Take 5 mg by mouth at bedtime. , Disp: , Rfl:    LYRICA 75 MG capsule, Take 75 mg by mouth 2 (two) times daily. , Disp: , Rfl:    Melatonin 10 MG TABS, Take 10 mg by mouth daily., Disp: , Rfl:    montelukast (SINGULAIR) 10 MG tablet, Take 1 tablet by mouth daily., Disp: , Rfl:    NASONEX 50 MCG/ACT nasal spray, Place 2 sprays into the nose daily. , Disp: , Rfl:    propranolol (INDERAL) 20 MG tablet, TAKE 1 TABLET(20 MG) BY MOUTH THREE TIMES DAILY AS NEEDED, Disp: 90 tablet, Rfl: 3   sertraline (ZOLOFT) 100 MG  tablet, Take 100 mg by mouth 2 (two) times daily. , Disp: , Rfl:    simvastatin (ZOCOR) 20 MG tablet, Take 20 mg by mouth daily., Disp: , Rfl:   Past Medical History: Past Medical History  Diagnosis Date   Hypertension     on medication x 10 years   Heart murmur     MVP ( symptomatic)   Dysrhythmia    Asthma     due to seasonal alllergies   Sleep apnea 2007    does not use CPAP since 40 # wt loss   GERD (gastroesophageal reflux disease)    History of IBS    Collagenous colitis 2010   Headache(784.0)     migraines; 2 x month   Arthritis     osteoarthritis   Anxiety    Depression     situational   Hyperlipemia    Syncopal episodes    Cataract    Shortness of breath     exertional    Tobacco Use: History  Smoking status   Never Smoker   Smokeless tobacco   Never Used    Labs: Recent Review Flowsheet Data    There is no flowsheet data to display.       ADL UCSD:     ADL UCSD      03/06/15 0900 04/27/15 1247 06/04/15 1000   ADL  UCSD   ADL Phase Entry Mid Exit   SOB Score total 44 45 36   Rest 0 3 0   Walk $Rem'3 2 1   'qmfq$ Stairs $Re'5 5 4   'aOF$ Bath $R'2 1 1   'Gp$ Dress 2 3 0   Shop $Rem'4 3 2       'AXlC$ Pulmonary Function Assessment:     Pulmonary Function Assessment - 03/06/15 0900    Pulmonary Function Tests   RV% 75 %   DLCO% 71 %   Initial Spirometry Results   FVC% 90 %   FEV1% 96 %   FEV1/FVC Ratio 82   Post Bronchodilator Spirometry Results   FVC% 98 %   FEV1% 105 %   FEV1/FVC Ratio 82      Exercise Target Goals:    Exercise Program Goal: Individual exercise prescription set with THRR, safety & activity barriers. Participant demonstrates ability to understand and report RPE using BORG scale, to self-measure pulse accurately, and to acknowledge the importance of the exercise prescription.  Exercise Prescription Goal: Starting with aerobic activity 30 plus minutes a day, 3 days per week for initial exercise prescription. Provide home exercise  prescription and guidelines that participant acknowledges understanding prior to discharge.  Activity Barriers & Risk Stratification:     Activity Barriers & Risk Stratification - 03/06/15 0900    Activity Barriers & Risk Stratification   Activity Barriers Left Knee Replacement   Risk Stratification Low      6 Minute Walk:     6 Minute Walk      03/06/15 1333 04/27/15 1111 06/01/15 1009   6 Minute Walk   Phase Initial Mid Program Discharge   Distance 805 feet 1150 feet 1230 feet   Distance % Change  30 % 53 %   Walk Time 6 minutes 6 minutes 6 minutes   Resting HR 89 bpm 79 bpm 79 bpm   Resting BP 128/86 mmHg 120/80 mmHg 128/68 mmHg   Max Ex. HR 133 bpm 127 bpm 120 bpm   Max Ex. BP 152/80 mmHg 152/90 mmHg 142/74 mmHg   RPE $Re'15 15 13   'SGy$ Perceived Dyspnea  $Remove'4 4 3   'MbibmNP$ Symptoms No No No      Initial Exercise Prescription:     Initial Exercise Prescription - 03/09/15 1300    NuStep   Level 2   Watts 40   Minutes 10   REL-XR   Level 3   Watts 20   Minutes 10      Exercise Prescription Changes:     Exercise Prescription Changes      03/23/15 1100 03/26/15 1000 04/02/15 1000 04/06/15 1000 04/11/15 1100   Exercise Review   Progression  Yes Yes Yes Yes   Response to Exercise   Duration Progress to 30 minutes of continuous aerobic without signs/symptoms of physical distress Progress to 30 minutes of continuous aerobic without signs/symptoms of physical distress Progress to 30 minutes of continuous aerobic without signs/symptoms of physical distress Progress to 30 minutes of continuous aerobic without signs/symptoms of physical distress Progress to 30 minutes of continuous aerobic without signs/symptoms of physical distress   Progression Continue progressive overload as per policy without signs/symptoms or physical distress. Continue progressive overload as per policy without signs/symptoms or physical distress. Continue progressive overload as per policy without signs/symptoms  or physical distress. Continue progressive overload as per policy without signs/symptoms or physical distress. Continue progressive overload as per policy without signs/symptoms or physical distress.   Resistance Training  Training Prescription Yes Yes Yes Yes Yes   Weight $Remov'1 1 2 2 2   'zaQGQX$ Reps 10-15 10-15 10-15 10-15 10-15   Interval Training   Interval Training No No No No No   NuStep   Level $Remo'2 2 2 2 3   'Qidgf$ Watts 40 40 40 40 45   Minutes $Remove'15 15 15 15 15   'urMXVwk$ Arm Ergometer   Level  $Remo'2 2 2 2   'oQNHl$ Watts  $Remo'10 10 10 10   'hFBoE$ Minutes  $Remove'10 10 10 10   'SIgEYTX$ REL-XR   Level $Remo'2 2 2 3 3   'YlZCv$ Watts $Rem'30 30 30 30 30   'CEJt$ Minutes $Re'15 15 15 15 15     'YLs$ 04/13/15 1100 04/16/15 1000 04/20/15 1000 04/23/15 1000 04/25/15 1000   Exercise Review   Progression     Yes   Response to Exercise   Blood Pressure (Admit)  126/70 mmHg  126/70 mmHg    Blood Pressure (Exercise)  132/72 mmHg  132/72 mmHg    Blood Pressure (Exit)  126/78 mmHg  126/78 mmHg    Heart Rate (Admit)  86 bpm  86 bpm    Heart Rate (Exercise)  122 bpm  122 bpm    Heart Rate (Exit)  76 bpm  76 bpm    Oxygen Saturation (Admit)  93 %  93 %    Oxygen Saturation (Exercise)  93 %  93 %    Oxygen Saturation (Exit)  96 %  96 %    Rating of Perceived Exertion (Exercise)  13  13    Perceived Dyspnea (Exercise)  3  3    Duration Progress to 30 minutes of continuous aerobic without signs/symptoms of physical distress Progress to 30 minutes of continuous aerobic without signs/symptoms of physical distress  Progress to 30 minutes of continuous aerobic without signs/symptoms of physical distress Progress to 30 minutes of continuous aerobic without signs/symptoms of physical distress   Intensity     THRR unchanged   Progression Continue progressive overload as per policy without signs/symptoms or physical distress. Continue progressive overload as per policy without signs/symptoms or physical distress.  Continue progressive overload as per policy without signs/symptoms or physical distress.  Continue progressive overload as per policy without signs/symptoms or physical distress.   Resistance Training   Training Prescription Yes Yes Yes Yes Yes   Weight 2 1  2lbs caused shoulder pain 2  2lbs caused shoulder pain 2  2lbs caused shoulder pain 2  2lbs caused shoulder pain   Reps 10-15 10-15 10-15 10-15 10-15   Interval Training   Interval Training No No No No No   NuStep   Level 2  BIOSTEP INSTEAD OF NS 3  BIOSTEP INSTEAD OF NS; L3, 30w, 52mins 3  BIOSTEP INSTEAD OF NS; L3, 30w, 87mins 3  BIOSTEP INSTEAD OF NS; L3, 30w, 34mins 4  BIOSTEP INSTEAD OF NS; L3, 30w, 60mins   Watts 30 40 40 40 35   Minutes $Remove'15 15 15 15 15   'UuKlklz$ Arm Ergometer   Level $Remo'2 3 3 3 3   'FGeei$ Watts $Rem'15 15 15 15 15   'wAUl$ Minutes $Re'10 10 10 10 10   'RkR$ REL-XR   Level  $Remo'3 3 4 4   'ACDzD$ Watts  70 70 60 60   Minutes  $Remove'15 15 15 15     'HBTlTRo$ 04/30/15 1000 05/02/15 1000 05/11/15 1100 05/16/15 1200 05/16/15 1600   Exercise Review   Progression Yes Yes Yes Yes Yes   Response to  Exercise   Blood Pressure (Admit)     124/70 mmHg   Blood Pressure (Exercise)     152/76 mmHg   Blood Pressure (Exit)     124/82 mmHg   Heart Rate (Admit)     86 bpm   Heart Rate (Exercise)     107 bpm   Heart Rate (Exit)     74 bpm   Oxygen Saturation (Admit)     95 %   Oxygen Saturation (Exercise)     94 %   Oxygen Saturation (Exit)     93 %   Rating of Perceived Exertion (Exercise)     13   Perceived Dyspnea (Exercise)     3   Duration Progress to 30 minutes of continuous aerobic without signs/symptoms of physical distress Progress to 30 minutes of continuous aerobic without signs/symptoms of physical distress Progress to 30 minutes of continuous aerobic without signs/symptoms of physical distress Progress to 30 minutes of continuous aerobic without signs/symptoms of physical distress Progress to 30 minutes of continuous aerobic without signs/symptoms of physical distress   Intensity THRR unchanged THRR unchanged THRR unchanged THRR unchanged THRR unchanged    Progression Continue progressive overload as per policy without signs/symptoms or physical distress. Continue progressive overload as per policy without signs/symptoms or physical distress. Continue progressive overload as per policy without signs/symptoms or physical distress. Continue progressive overload as per policy without signs/symptoms or physical distress. Continue progressive overload as per policy without signs/symptoms or physical distress.   Resistance Training   Training Prescription Yes Yes Yes Yes Yes   Weight 2  2lbs caused shoulder pain 2  2lbs caused shoulder pain 2  2lbs caused shoulder pain 2  2lbs caused shoulder pain 2  2lbs caused shoulder pain   Reps 10-15 10-15 10-15 10-15 10-15   Interval Training   Interval Training No No No No No   NuStep   Level 4  BIOSTEP INSTEAD OF NS; L3, 30w, 75mins 5  BIOSTEP INSTEAD OF NS; L3, 30w, 3mins 5  BIOSTEP INSTEAD OF NS; L3, 30w, 29mins 5  BIOSTEP INSTEAD OF NS; L3, 30w, 12mins 5  BIOSTEP INSTEAD OF NS; L3, 30w, 59mins   Watts 35 40 40 40 40   Minutes $Remove'15 15 15 15 15   'LsfOrCZ$ Arm Ergometer   Level $Remo'3 3 3 3 3   'ajWMz$ Watts $Rem'15 15 15 15 15   'SYiI$ Minutes $Re'10 10 10 10 12   'htZ$ REL-XR   Level $Remo'4 4 4 5 5   'SGNoV$ Watts 65 65 65 70 70   Minutes $Remove'15 15 15 15 15     'pwttIxn$ 05/18/15 1100 05/25/15 1000 05/28/15 1000 05/30/15 1000 06/13/15 1000   Exercise Review   Progression Yes Yes Yes Yes Yes   Response to Exercise   Blood Pressure (Admit) 124/70 mmHg       Blood Pressure (Exercise) 152/76 mmHg       Blood Pressure (Exit) 124/82 mmHg       Heart Rate (Admit) 86 bpm       Heart Rate (Exercise) 107 bpm       Heart Rate (Exit) 74 bpm       Oxygen Saturation (Admit) 95 %       Oxygen Saturation (Exercise) 94 %       Oxygen Saturation (Exit) 93 %       Rating of Perceived Exertion (Exercise) 13       Perceived Dyspnea (Exercise) 3  Duration Progress to 30 minutes of continuous aerobic without signs/symptoms of physical distress   Progress to 50 minutes of  aerobic without signs/symptoms of physical distress Progress to 50 minutes of aerobic without signs/symptoms of physical distress   Intensity THRR unchanged   THRR unchanged THRR unchanged   Progression Continue progressive overload as per policy without signs/symptoms or physical distress.   Continue progressive overload as per policy without signs/symptoms or physical distress. Continue progressive overload as per policy without signs/symptoms or physical distress.   Resistance Training   Training Prescription Yes   Yes Yes   Weight 2  2lbs caused shoulder pain   2 2   Reps 10-15   10-15 10-15   Interval Training   Interval Training No   No No   NuStep   Level 5  BIOSTEP INSTEAD OF NS; L3, 30w, 50mins   5 5  BioStep   Watts 60   60 45   Minutes $Remove'15   15 15   'YpzEwbp$ Arm Ergometer   Level $Remo'3   4 4   'AJajw$ Watts $Rem'15   18 18   'Ymvc$ Minutes $Re'12   12 12   'aOM$ REL-XR   Level $Remo'5 6 6 6 6   'PTodU$ Watts 70 65 80 80 80   Minutes $Remove'15 15 15 15 15     'BuWnwxG$ 06/13/15 1500 06/15/15 1300         Exercise Review   Progression Yes Yes      Response to Exercise   Blood Pressure (Admit) 124/74 mmHg 98/70 mmHg      Blood Pressure (Exercise) 144/74 mmHg 158/78 mmHg      Blood Pressure (Exit) 128/72 mmHg 136/86 mmHg      Heart Rate (Admit) 89 bpm 85 bpm      Heart Rate (Exercise) 117 bpm 119 bpm      Heart Rate (Exit) 96 bpm 95 bpm      Oxygen Saturation (Admit) 93 % 96 %      Oxygen Saturation (Exercise) 94 % 93 %      Oxygen Saturation (Exit) 94 % 95 %      Rating of Perceived Exertion (Exercise) 13 11      Perceived Dyspnea (Exercise) 3 3      Frequency Add 2 additional days to program exercise sessions. Add 2 additional days to program exercise sessions.      Duration Progress to 50 minutes of aerobic without signs/symptoms of physical distress Progress to 50 minutes of aerobic without signs/symptoms of physical distress      Intensity Rest + 30 Rest + 30      Progression Continue progressive overload as per policy without  signs/symptoms or physical distress. Continue progressive overload as per policy without signs/symptoms or physical distress.      Resistance Training   Training Prescription Yes Yes      Weight 2 2      Reps 10-12 10-15      Interval Training   Interval Training  No      NuStep   Level 5  BioStep 5  biostep      Watts 45 45      Minutes 15 15      Arm Ergometer   Level 4 4      Watts 18 18      Minutes 15 15      REL-XR   Level 7 7      Watts 85 85  Minutes 15 15      Home Exercise Plan   Plans to continue exercise at  [facility]         Discharge Exercise Prescription (Final Exercise Prescription Changes):     Exercise Prescription Changes - 06/15/15 1300    Exercise Review   Progression Yes   Response to Exercise   Blood Pressure (Admit) 98/70 mmHg   Blood Pressure (Exercise) 158/78 mmHg   Blood Pressure (Exit) 136/86 mmHg   Heart Rate (Admit) 85 bpm   Heart Rate (Exercise) 119 bpm   Heart Rate (Exit) 95 bpm   Oxygen Saturation (Admit) 96 %   Oxygen Saturation (Exercise) 93 %   Oxygen Saturation (Exit) 95 %   Rating of Perceived Exertion (Exercise) 11   Perceived Dyspnea (Exercise) 3   Frequency Add 2 additional days to program exercise sessions.   Duration Progress to 50 minutes of aerobic without signs/symptoms of physical distress   Intensity Rest + 30   Progression Continue progressive overload as per policy without signs/symptoms or physical distress.   Resistance Training   Training Prescription Yes   Weight 2   Reps 10-15   Interval Training   Interval Training No   NuStep   Level 5  biostep   Watts 45   Minutes 15   Arm Ergometer   Level 4   Watts 18   Minutes 15   REL-XR   Level 7   Watts 85   Minutes 15   Home Exercise Plan   Plans to continue exercise at [place]:  Target Goals: Understanding of nutrition guidelines, daily intake of sodium '1500mg'$ , cholesterol '200mg'$ , calories 30% from fat and 7% or less  from saturated fats, daily to have 5 or more servings of fruits and vegetables.  Biometrics:     Pre Biometrics - 03/06/15 1339    Pre Biometrics   Height $Remov'5\' 8"'xAkCqj$  (1.727 m)   Weight 254 lb (115.214 kg)   Waist Circumference 41.5 inches   Hip Circumference 51 inches   Waist to Hip Ratio 0.81 %   BMI (Calculated) 38.7         Post Biometrics - 06/01/15 1010     Post  Biometrics   Height $Remov'5\' 8"'ZeVPXW$  (1.727 m)   Weight 248 lb (112.492 kg)   Waist Circumference 41 inches   Hip Circumference 51 inches   Waist to Hip Ratio 0.8 %   BMI (Calculated) 37.8      Nutrition Therapy Plan and Nutrition Goals:     Nutrition Therapy & Goals - 03/06/15 0900    Nutrition Therapy   Diet Ms Guier prefers not to meet with the dietitian, although her goal to loss weight is 55lbs; she knows able mutrition and what to do.      Nutrition Discharge: Rate Your Plate Scores:   Psychosocial: Target Goals: Acknowledge presence or absence of depression, maximize coping skills, provide positive support system. Participant is able to verbalize types and ability to use techniques and skills needed for reducing stress and depression.  Initial Review & Psychosocial Screening:     Initial Psych Review & Screening - 03/06/15 0900    Initial Review   Current issues with Current Depression   Family Dynamics   Good Support System? Yes  Good support from her husband and 2 sons.   Comments Ms Lender is dealing with her parent's health - they are both in their 13's with serious  health concerns, and she is the only child. She also has depression from her medical conserns.   Barriers   Psychosocial barriers to participate in program The patient should benefit from training in stress management and relaxation.   Screening Interventions   Interventions Encouraged to exercise  Ms Lansdale meets with a counselor weekly.      Quality of Life Scores:     Quality of Life - 06/04/15 1000    Quality of Life Scores    Health/Function Post 20.56 %   Socioeconomic Post 29.29 %   Psych/Spiritual Post 24.79 %   Family Post 18.4 %   GLOBAL Post 20.56 %      PHQ-9:     Recent Review Flowsheet Data    Depression screen [facility] 2/9 06/04/2015 05/11/2015 03/06/2015   Decreased Interest 1 1 1    Down, Depressed, Hopeless 1 1 2    PHQ - 2 Score 2 2 3    Altered sleeping 0 0 1   Tired, decreased energy 1 2 1    Change in appetite 0 0 1   Feeling bad or failure about yourself  0 1 2   Trouble concentrating 0 0 1   Moving slowly or fidgety/restless 0 0 0   Suicidal thoughts 0 0 0   PHQ-9 Score 3 5 9    Difficult doing work/chores Not difficult at all Very difficult Somewhat difficult      Psychosocial Evaluation and Intervention:     Psychosocial Evaluation - 03/12/15 1232    Psychosocial Evaluation & Interventions   Interventions Stress management education;Relaxation education;Encouraged to exercise with the program and follow exercise prescription   Comments Counselor met with Ms. Mcnabb for initial psychosocial evaluation.  She is a 69 year old who came into this program as a result of breathing problems and an "undiagnosed cough" that she has struggled with for quite some time.  Ms. Nebergall has a strong support  system with a spouse of 43 years and is actively involved in her faith community.  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  Ms. Meiser states she has struggled with depression for the past (3) years subsequent to caring for her sick parents long distance (as they live in [place], and refuse to move to [facility]).  Ms. Rottman has been seeing a psychologist/counselor to support her through this time as well as taking medication as prescribed.  She reports stress from caring from her parents, traveling back and forth to help them in [place], and her own medical  issues.  Her goals for this program are to increase her energy and ability to breathe better and possibly lose some weight in the process.     Continued Psychosocial Services Needed Yes  Ms. Bir will benefit from the psychoeducational components of this program, especially stress management and depression.  She also will benefit from the relaxation components as well as seeing the dietician for help with her weight loss goals.      Psychosocial Re-Evaluation:     Psychosocial Re-Evaluation      03/26/15 1110 04/25/15 1024 06/06/15 1059       Psychosocial Re-Evaluation   Comments Ms. Arps has been out of class for several weeks and returned today.  Met with counselor briefly reporting her father had passed away in [place] and she has been there taking care of the  affairs.  She stated she was doing "okay" currently and made it through Father's Day yesterday.  Counselor offered supportive services and will follow with Ms. Zielinski as needed.  Follow up with Ms. Doria today reporting increased energy and strength lately as a result of this class.  She also has noticed being able to get out of the bed and so other daily living skills without assistance recently as well.  Ms. Mun reports she saw her PCP yesterday and there is a concern about her sugar levels that he plans to monitor closely over the next few months and tasked Ms. B with making appropriate adjustments to bring this down.  Counselor discussed this with Ms. B and she has a plan for addressing this issue in order to be healthier and avoid more medications.  Counselor will continue to follow with Ms. B as needed.  Follow up today with Ms. Onstott reporting she has (4) more sessions before she graduates from this program and has benefitted so much that she is on a list for a follow up program.  Ms. Oyer has made great progress on her stamina, strength and energy levels increasing since beginning this program.  She has made significant  progress on her 6 minute mile as well.  She continues to have family stressors with her elderly mother living in [place] and counselor reminded Ms. Mungia of her stress management techniques.  She reports seeing her psychologist for her mental health needs as well as relying on her good friends and faith during stressful times.  She plans to walk the dogs more with her spouse in the interim between this program and the follow up program, as [facility].  Counselor supported Ms. Sortino and commended her on all her progress.  She has been a pleasure to work with in this program.       Continued Psychosocial Services Needed   Yes  Continue practicing stress management techniques to cope with current stressors in her life at this time, as well as use of ongoing, consistent exercise.         Education: Education Goals: Education classes will be provided on a weekly basis, covering required topics. Participant will state understanding/return demonstration of topics presented.  Learning Barriers/Preferences:   Education Topics: Initial Evaluation Education: - Verbal, written and demonstration of respiratory meds, RPE/PD scales, oximetry and breathing techniques. Instruction on use of nebulizers and MDIs: cleaning and proper use, rinsing mouth with steroid doses and importance of monitoring MDI activations.          Pulmonary Rehab from 06/15/2015 in [place]   Date  03/06/15   Educator  LB   Instruction Review Code  2- meets goals/outcomes      General Nutrition Guidelines/Fats and Fiber: -Group instruction provided by verbal, written material, models and posters to present the general guidelines for heart healthy nutrition. Gives an explanation and review of dietary fats and fiber.      Pulmonary Rehab from 06/15/2015 in [place]   Date  04/02/15   Educator  CR    Instruction Review Code  2- meets goals/outcomes      Controlling Sodium/Reading Food Labels: -Group verbal and written material supporting the discussion of sodium use in heart healthy nutrition. Review and explanation with models, verbal and written materials for utilization of the food label.      Pulmonary Rehab  from 06/15/2015 in [place]   Date  05/28/15   Educator  CR   Instruction Review Code  2- meets goals/outcomes      Exercise Physiology & Risk Factors: - Group verbal and written instruction with models to review the exercise physiology of the cardiovascular system and associated critical values. Details cardiovascular disease risk factors and the goals associated with each risk factor.      Pulmonary Rehab from 06/15/2015 in [place]   Date  06/13/15   Educator  SW   Instruction Review Code  2- meets goals/outcomes      Aerobic Exercise & Resistance Training: - Gives group verbal and written discussion on the health impact of inactivity. On the components of aerobic and resistive training programs and the benefits of this training and how to safely progress through these programs.      Pulmonary Rehab from 06/15/2015 in [place]   Date  04/11/15   Educator  SW   Instruction Review Code  2- meets goals/outcomes      Flexibility, Balance, General Exercise Guidelines: - Provides group verbal and written instruction on the benefits of flexibility and balance training programs. Provides general exercise guidelines with specific guidelines to those with heart or lung disease. Demonstration and skill practice provided.   Stress Management: - Provides group verbal and written instruction about the health risks of elevated stress, cause of high stress, and healthy ways to reduce stress.   Depression: - Provides group verbal and written instruction on the  correlation between heart/lung disease and depressed mood, treatment options, and the stigmas associated with seeking treatment.      Pulmonary Rehab from 06/15/2015 in [place]   Date  05/02/15   Educator  [facility]   Instruction Review Code  2- meets goals/outcomes      Exercise & Equipment Safety: - Individual verbal instruction and demonstration of equipment use and safety with use of the equipment.      Pulmonary Rehab from 06/15/2015 in [place]   Date  06/15/15 [facility] exercise plan]   Educator  LB   Instruction Review Code  2- meets goals/outcomes      Infection Prevention: - Provides verbal and written material to individual with discussion of infection control including proper hand washing and proper equipment cleaning during exercise session.      Pulmonary Rehab from 06/15/2015 in [place]   Date  03/06/15   Educator  LB   Instruction Review Code  2- meets goals/outcomes      Falls Prevention: - Provides verbal and written material to individual with discussion of falls prevention and safety.      Pulmonary Rehab from 06/15/2015 in [place]   Date  03/06/15   Educator  LB   Instruction Review Code  2- meets goals/outcomes      Diabetes: - Individual verbal and written instruction to review signs/symptoms of diabetes, desired ranges of glucose level fasting, after meals and with exercise. Advice that pre and post exercise glucose checks will be done for 3 sessions at entry of program.      Pulmonary Rehab from 06/15/2015 in [place]   Date  06/08/15 [facility] your Numbers]   Educator  C. Enterkin,RN   Instruction Review Code  2- meets goals/outcomes  Chronic Lung Diseases: - Group verbal and written instruction to review new updates, new respiratory medications, new advancements in  procedures and treatments. Provide informative websites and "800" numbers of self-education.      Pulmonary Rehab from 06/15/2015 in [place]   Date  05/14/15   Educator  LB   Instruction Review Code  2- meets goals/outcomes      Lung Procedures: - Group verbal and written instruction to describe testing methods done to diagnose lung disease. Review the outcome of test results. Describe the treatment choices: Pulmonary Function Tests, ABGs and oximetry.      Pulmonary Rehab from 06/15/2015 in [place]   Date  05/11/15   Educator  Frederich Cha, RRT   Instruction Review Code  2- meets goals/outcomes      Energy Conservation: - Provide group verbal and written instruction for methods to conserve energy, plan and organize activities. Instruct on pacing techniques, use of adaptive equipment and posture/positioning to relieve shortness of breath.      Pulmonary Rehab from 06/15/2015 in [place]   Date  05/23/15   Educator  SW   Instruction Review Code  2- meets goals/outcomes      Triggers: - Group verbal and written instruction to review types of environmental controls: home humidity, furnaces, filters, dust mite/pet prevention, HEPA vacuums. To discuss weather changes, air quality and the benefits of nasal washing.      Pulmonary Rehab from 06/15/2015 in [place]   Date  04/16/15   Educator  LB   Instruction Review Code  2- meets goals/outcomes      Exacerbations: - Group verbal and written instruction to provide: warning signs, infection symptoms, calling MD promptly, preventive modes, and value of vaccinations. Review: effective airway clearance, coughing and/or vibration techniques. Create an Sports administrator.      Pulmonary Rehab from 06/15/2015 in [place]   Date  03/12/15   Educator  LB    Instruction Review Code  2- meets goals/outcomes      Oxygen: - Individual and group verbal and written instruction on oxygen therapy. Includes supplement oxygen, available portable oxygen systems, continuous and intermittent flow rates, oxygen safety, concentrators, and Medicare reimbursement for oxygen.   Respiratory Medications: - Group verbal and written instruction to review medications for lung disease. Drug class, frequency, complications, importance of spacers, rinsing mouth after steroid MDI's, and proper cleaning methods for nebulizers.      Pulmonary Rehab from 06/15/2015 in [place]   Date  03/06/15   Educator  LB   Instruction Review Code  2- meets goals/outcomes      AED/CPR: - Group verbal and written instruction with the use of models to demonstrate the basic use of the AED with the basic ABC's of resuscitation.      Pulmonary Rehab from 06/15/2015 in [place]   Date  05/18/15   Educator  C. Enterkin,RN   Instruction Review Code  2- meets goals/outcomes      Breathing Retraining: - Provides individuals verbal and written instruction on purpose, frequency, and proper technique of diaphragmatic breathing and pursed-lipped breathing. Applies individual practice skills.      Pulmonary Rehab from 06/15/2015 in [place]   Date  03/06/15   Educator  LB [03/09/2015 Reviewed PLB and encouraged to use with  exercise]   Instruction Review Code  2- meets goals/outcomes      Anatomy and Physiology of the Lungs: - Group verbal and written instruction with the use of models to provide basic lung anatomy and physiology related to function, structure and complications of lung disease.      Pulmonary Rehab from 06/15/2015 in [facility]   Date  03/30/15   Educator  SJ   Instruction Review Code  2- meets goals/outcomes      Heart  Failure: - Group verbal and written instruction on the basics of heart failure: signs/symptoms, treatments, explanation of ejection fraction, enlarged heart and cardiomyopathy.      Pulmonary Rehab from 06/15/2015 in [facility]   Date  04/27/15   Educator  Tamsen Snider, RT   Instruction Review Code  2- meets goals/outcomes      Sleep Apnea: - Individual verbal and written instruction to review Obstructive Sleep Apnea. Review of risk factors, methods for diagnosing and types of masks and machines for OSA.   Anxiety: - Provides group, verbal and written instruction on the correlation between heart/lung disease and anxiety, treatment options, and management of anxiety.      Pulmonary Rehab from 06/15/2015 in [facility]   Date  05/30/15   Educator  [facility]   Instruction Review Code  2- Meets goals/outcomes      Relaxation: - Provides group, verbal and written instruction about the benefits of relaxation for patients with heart/lung disease. Also provides patients with examples of relaxation techniques.   Knowledge Questionnaire Score:     Knowledge Questionnaire Score - 03/06/15 0900    Knowledge Questionnaire Score   Pre Score -3      Personal Goals and Risk Factors at Admission:     Personal Goals and Risk Factors at Admission - 03/06/15 0900    Personal Goals and Risk Factors on Admission    Weight Management Yes   Intervention Learn and follow the exercise and diet guidelines while in the program. Utilize the nutrition and education classes to help gain knowledge of the diet and exercise expectations in the program   Increase Aerobic Exercise and Physical Activity Yes   Intervention While in program, learn and follow the exercise prescription taught. Start at a low level workload and increase workload after able to maintain previous level for 30 minutes. Increase time before increasing intensity.    Understand more about Heart/Pulmonary Disease. Yes   Intervention While in program utilize professionals for any questions, and attend the education sessions. Great websites to use are www.americanheart.org or www.lung.org for reliable information.   Improve shortness of breath with ADL's Yes   Intervention While in program, learn and follow the exercise prescription taught. Start at a low level workload and increase workload ad advised by the exercise physiologist. Increase time before increasing intensity.   Develop more efficient breathing techniques such as purse lipped breathing and diaphragmatic breathing; and practicing self-pacing with activity Yes   Intervention While in program, learn and utilize the specific breathing techniques taught to you. Continue to practice and use the techniques as needed.   Increase knowledge of respiratory medications and ability to use respiratory devices properly.  Yes   Intervention While in program learn and demonstrate appropriate use of your oxygen therapy by increasing flow with exertion, manage oxygen tank operation, including continuous and intermittent flow.  Understanding oxygen is a drug ordered by your physician.  Hypertension Yes   Goal Participant will see blood pressure controlled within the values of 140/51mm/Hg or within value directed by their physician.   Intervention Provide nutrition & aerobic exercise along with prescribed medications to achieve BP 140/90 or less.   Stress Yes   Goal To meet with psychosocial counselor for stress and relaxation information and guidance. To state understanding of performing relaxation techniques and or identifying personal stressors.   Intervention Provide education on types of stress, identifiying stressors, and ways to cope with stress. Provide demonstration and active practice of relaxation techniques.      Personal Goals and Risk Factors Review:      Goals and Risk Factor Review      04/02/15 1000  04/04/15 1000 04/11/15 1034 04/16/15 1000 04/25/15 1000   Weight Management   Goals Progress/Improvement seen   No     Comments   Has not seen much progress in this area, but understands the weightloss process and that it takes time and consistency.      Increase Aerobic Exercise and Physical Activity   Goals Progress/Improvement seen   Yes Yes  Yes   Comments  Ms Summey on her own initiative increased the level on the XR to L3. She did reduce her weights to 1lb due to left shoulder pain from the 2lbs on Monday. Has felt a large and noticeable difference in her stamina and strength  Ms Amir states that [facility] has improved her stamina and activites at home. She states she can get up fom a chair better and can step out of the shower with less difficulty.   Understand more about Heart/Pulmonary Disease   Goals Progress/Improvement seen    Yes     Comments   Is more comfortable managing her disease. Has learned a lot in the educational sessions and from talking with staff and other patients     Improve shortness of breath with ADL's   Goals Progress/Improvement seen    No     Comments   Has not seen a noticeable difference in this area     Breathing Techniques   Goals Progress/Improvement seen  Yes  Yes     Comments Observing Ms Sakata using PLB during her exercise goals; she states she finds it helpful with activities at home as well.  Has seen a noticeable difference in how effective the breathing techniques are and uses them on her own     Increase knowledge of respiratory medications   Goals Progress/Improvement seen     Yes    Comments    Ms Berrocal has been taking her Advair only in the morning and not in the evening; she was taking her albuterol in the evening instead; instructed her to take the Advair twice a day and then she may not even need the Albuterol.      04/30/15 1000 05/02/15 1105 05/14/15 1000 05/16/15 1000 05/30/15 1023   Weight Management   Goals Progress/Improvement seen     Yes    Comments    Ms Varricchio did meet with the dietitian for consult on her diabetes. She has one more follow-up appointment.    Increase Aerobic Exercise and Physical Activity   Goals Progress/Improvement seen  Yes Yes   Yes   Comments Ms Hegwood has improved her Mid 56mwd by 324ft and states she has improved stamina and energy. becoming stronger, able to ambulate more freely without cane.   Kemiyah continues to increase on eqipment and went up to  level 4 on the AE. She also is on the waiting list for two FF classes and hopes to immediately join FF following LW graduation. Renatha said she is so encouraged by her exercise progress and wants to do all she can to continue it.    Understand more about Heart/Pulmonary Disease   Goals Progress/Improvement seen   Yes      Comments  does not       Improve shortness of breath with ADL's   Goals Progress/Improvement seen   Yes      Comments  is making gains, however, prolonged hot weather is causing more problems lately for her.      Breathing Techniques   Goals Progress/Improvement seen   Yes Yes     Comments   Using her PLB with her exercise; she has improved considerately with her walk into [facility] and after LW.     Increase knowledge of respiratory medications   Goals Progress/Improvement seen    Yes     Comments   Ms Reino asked for a new aerochamber for her Albuterol and Advair MDI's - she is aware of the chambers benefits.        Personal Goals Discharge:      Personal Goals at Discharge - 06/13/15 1000    Weight Management   Comments Ms Lovern has meet with the dietitian twice this month for diabetes counciling and has a good understanding of weight management. She has lose 8lbs in [place].   Increase Aerobic Exercise and Physical Activity   Goals Progress/Improvement seen  Yes   Comments Ms Solimine has improved her post 78mwd by 432ft. She is so encouraged by her exercise progress and wants to do all she can to continue it. She plans to  join [employer].   Understand more about Heart/Pulmonary Disease   Goals Progress/Improvement seen  Yes   Comments [name] has enjoyed the education and how it relates to managing her disease with daily living.   Improve shortness of breath with ADL's   Goals Progress/Improvement seen  Yes   Comments Ms Foland still has shortness of breath with activites at times, but she is more confident in managing it with PLB and pacing.   Increase knowledge of respiratory medications   Goals Progress/Improvement seen  Yes   Comments Ms Gittings has good understanding of her Albuterol and Advair MDIs and her spacer.      Comments: Ms. Lanice Shirts has graduated from [employer] [facility] [facility].  She plans to attend [facility] to continue her exercise three days a week.

## 2015-06-15 NOTE — Progress Notes (Signed)
Daily Session Note  Patient Details  Name: Caitlin Leonard MRN: [id number] Date of Birth: 04/02/1946 Referring Provider:  Gollan, Timothy J, MD  Encounter Date: 06/15/2015  Check In:     Session Check In - 06/15/15 1051    Check-In   Staff Present   MS, ACSM CEP Exercise Physiologist;Susanne Bice RN, BSN, CCRP;Laureen Brown BS, RRT, Respiratory Therapist;Stacey Joyce RRT, RCP Respiratory Therapist   ER physicians immediately available to respond to emergencies [facility] immediately available ER MD   Physician(s) Quale and Stafford   Medication changes reported     No   Fall or balance concerns reported    No   Warm-up and Cool-down Performed on first and last piece of equipment   VAD Patient? No   Pain Assessment   Currently in Pain? No/denies   Multiple Pain Sites No         Goals Met:  Proper associated with RPD/PD & O2 Sat Independence with exercise equipment Using PLB without cueing & demonstrates good technique Exercise tolerated well Personal goals reviewed Strength training completed today  Goals Unmet:  Not Applicable  Goals Comments: Graduates today! Discussed Reine's exercise goals and she verbalized understanding and is going to commit to continue with exercise. She is on the waiting list to get into the FF program.    Dr. Mark Miller is Medical Director for [facility] and [facility]. 

## 2015-06-15 NOTE — Patient Instructions (Signed)
pul Discharge Instructions  Patient Details  Name: Caitlin Leonard MRN: [id number] Date of Birth: Feb 15, 1946 Referring Provider:  Minna Merritts, MD   Number of Visits: 35  Reason for Discharge:  Patient reached a stable level of exercise. Patient independent in their exercise.  Smoking History:  History  Smoking status   Never Smoker   Smokeless tobacco   Never Used    Diagnosis:  Asthma, chronic, unspecified asthma severity, uncomplicated - Plan: PULMONARY REHAB 30 DAY REVIEW  Initial Exercise Prescription:     Initial Exercise Prescription - 03/09/15 1300    NuStep   Level 2   Watts 40   Minutes 10   REL-XR   Level 3   Watts 20   Minutes 10      Discharge Exercise Prescription (Final Exercise Prescription Changes):     Exercise Prescription Changes - 06/15/15 1300    Exercise Review   Progression Yes   Response to Exercise   Blood Pressure (Admit) 98/70 mmHg   Blood Pressure (Exercise) 158/78 mmHg   Blood Pressure (Exit) 136/86 mmHg   Heart Rate (Admit) 85 bpm   Heart Rate (Exercise) 119 bpm   Heart Rate (Exit) 95 bpm   Oxygen Saturation (Admit) 96 %   Oxygen Saturation (Exercise) 93 %   Oxygen Saturation (Exit) 95 %   Rating of Perceived Exertion (Exercise) 11   Perceived Dyspnea (Exercise) 3   Frequency Add 2 additional days to program exercise sessions.   Duration Progress to 50 minutes of aerobic without signs/symptoms of physical distress   Intensity Rest + 30   Progression Continue progressive overload as per policy without signs/symptoms or physical distress.   Resistance Training   Training Prescription Yes   Weight 2   Reps 10-15   Interval Training   Interval Training No   NuStep   Level 5  biostep   Watts 45   Minutes 15   Arm Ergometer   Level 4   Watts 18   Minutes 15   REL-XR   Level 7   Watts 85   Minutes 15   Home Exercise Plan   Plans to continue exercise at [place]:     6  Minute Walk      03/06/15 1333 04/27/15 1111 06/01/15 1009   6 Minute Walk   Phase Initial Mid Program Discharge   Distance 805 feet 1150 feet 1230 feet   Distance % Change  30 % 53 %   Walk Time 6 minutes 6 minutes 6 minutes   Resting HR 89 bpm 79 bpm 79 bpm   Resting BP 128/86 mmHg 120/80 mmHg 128/68 mmHg   Max Ex. HR 133 bpm 127 bpm 120 bpm   Max Ex. BP 152/80 mmHg 152/90 mmHg 142/74 mmHg   RPE 15 15 13    Perceived Dyspnea  4 4 3    Symptoms No No No      Quality of Life:     Quality of Life - 06/04/15 1000    Quality of Life Scores   Health/Function Post 20.56 %   Socioeconomic Post 29.29 %   Psych/Spiritual Post 24.79 %   Family Post 18.4 %   GLOBAL Post 20.56 %      Personal Goals: Goals established at orientation with interventions provided to work toward goal.     Personal Goals and Risk Factors at Admission - 03/06/15 0900    Personal Goals and Risk Factors  on Admission    Weight Management Yes   Intervention Learn and follow the exercise and diet guidelines while in the program. Utilize the nutrition and education classes to help gain knowledge of the diet and exercise expectations in the program   Increase Aerobic Exercise and Physical Activity Yes   Intervention While in program, learn and follow the exercise prescription taught. Start at a low level workload and increase workload after able to maintain previous level for 30 minutes. Increase time before increasing intensity.   Understand more about Heart/Pulmonary Disease. Yes   Intervention While in program utilize professionals for any questions, and attend the education sessions. Great websites to use are www.americanheart.org or www.lung.org for reliable information.   Improve shortness of breath with ADL's Yes   Intervention While in program, learn and follow the exercise prescription taught. Start at a low level workload and increase workload ad advised by the exercise physiologist. Increase time before  increasing intensity.   Develop more efficient breathing techniques such as purse lipped breathing and diaphragmatic breathing; and practicing self-pacing with activity Yes   Intervention While in program, learn and utilize the specific breathing techniques taught to you. Continue to practice and use the techniques as needed.   Increase knowledge of respiratory medications and ability to use respiratory devices properly.  Yes   Intervention While in program learn and demonstrate appropriate use of your oxygen therapy by increasing flow with exertion, manage oxygen tank operation, including continuous and intermittent flow.  Understanding oxygen is a drug ordered by your physician.   Hypertension Yes   Goal Participant will see blood pressure controlled within the values of 140/59mm/Hg or within value directed by their physician.   Intervention Provide nutrition & aerobic exercise along with prescribed medications to achieve BP 140/90 or less.   Stress Yes   Goal To meet with psychosocial counselor for stress and relaxation information and guidance. To state understanding of performing relaxation techniques and or identifying personal stressors.   Intervention Provide education on types of stress, identifiying stressors, and ways to cope with stress. Provide demonstration and active practice of relaxation techniques.       Personal Goals Discharge:     Personal Goals at Discharge - 06/13/15 1000    Weight Management   Comments [name] has meet with the dietitian twice this month for diabetes counciling and has a good understanding of weight management. She has lose 8lbs in [place].   Increase Aerobic Exercise and Physical Activity   Goals Progress/Improvement seen  Yes   Comments [name] has improved her post 64mwd by 463ft. She is so encouraged by her exercise progress and wants to do all she can to continue it. She plans to join [employer].   Understand more about Heart/Pulmonary  Disease   Goals Progress/Improvement seen  Yes   Comments [name] has enjoyed the education and how it relates to managing her disease with daily living.   Improve shortness of breath with ADL's   Goals Progress/Improvement seen  Yes   Comments [name] still has shortness of breath with activites at times, but she is more confident in managing it with PLB and pacing.   Increase knowledge of respiratory medications   Goals Progress/Improvement seen  Yes   Comments Ms Mothershead has good understanding of her Albuterol and Advair MDIs and her spacer.      Nutrition & Weight - Outcomes:     Pre Biometrics - 03/06/15 1339  Pre Biometrics   Height 5\' 8"  (1.727 m)   Weight 254 lb (115.214 kg)   Waist Circumference 41.5 inches   Hip Circumference 51 inches   Waist to Hip Ratio 0.81 %   BMI (Calculated) 38.7         Post Biometrics - 06/01/15 1010     Post  Biometrics   Height 5\' 8"  (1.727 m)   Weight 248 lb (112.492 kg)   Waist Circumference 41 inches   Hip Circumference 51 inches   Waist to Hip Ratio 0.8 %   BMI (Calculated) 37.8      Nutrition:     Nutrition Therapy & Goals - 03/06/15 0900    Nutrition Therapy   Diet Ms Ow prefers not to meet with the dietitian, although her goal to loss weight is 55lbs; she knows able mutrition and what to do.      Nutrition Discharge:   Education Questionnaire Score:     Knowledge Questionnaire Score - 03/06/15 0900    Knowledge Questionnaire Score   Pre Score -3      Goals reviewed with patient; copy given to patient.

## 2015-07-17 ENCOUNTER — Ambulatory Visit: Payer: Medicare PPO | Admitting: Internal Medicine

## 2015-09-10 ENCOUNTER — Encounter: Payer: Self-pay | Admitting: Cardiovascular Disease

## 2015-09-10 ENCOUNTER — Ambulatory Visit (INDEPENDENT_AMBULATORY_CARE_PROVIDER_SITE_OTHER): Payer: Medicare PPO | Admitting: Cardiovascular Disease

## 2015-09-10 VITALS — BP 130/86 | HR 83 | Ht 68.5 in | Wt 238.8 lb

## 2015-09-10 DIAGNOSIS — R053 Chronic cough: Secondary | ICD-10-CM

## 2015-09-10 DIAGNOSIS — R0602 Shortness of breath: Secondary | ICD-10-CM

## 2015-09-10 DIAGNOSIS — I712 Thoracic aortic aneurysm, without rupture: Secondary | ICD-10-CM | POA: Diagnosis not present

## 2015-09-10 DIAGNOSIS — I7121 Aneurysm of the ascending aorta, without rupture: Secondary | ICD-10-CM

## 2015-09-10 DIAGNOSIS — I2510 Atherosclerotic heart disease of native coronary artery without angina pectoris: Secondary | ICD-10-CM

## 2015-09-10 DIAGNOSIS — R0600 Dyspnea, unspecified: Secondary | ICD-10-CM

## 2015-09-10 DIAGNOSIS — R05 Cough: Secondary | ICD-10-CM | POA: Diagnosis not present

## 2015-09-10 DIAGNOSIS — I2584 Coronary atherosclerosis due to calcified coronary lesion: Secondary | ICD-10-CM

## 2015-09-10 DIAGNOSIS — E669 Obesity, unspecified: Secondary | ICD-10-CM

## 2015-09-10 MED ORDER — FUROSEMIDE 20 MG PO TABS: 20.0000 mg | ORAL_TABLET | Freq: Every day | ORAL | Status: DC | PRN

## 2015-09-10 NOTE — Assessment & Plan Note (Addendum)
Long discussion with her today concerning her symptoms and review of prior testing with her on today's visit,  Including exercise  Ventilation study. No clear indication of what is causing her shortness of breath  We have recommended continued exercise, diet, weight loss  Encouraged her to take Lasix daily  Normal renal function, unable to exclude component of diastolic CHF  repeat echocardiogram could be ordered if no improvement of symptoms. She did have one in 2015, not all data available on that study

## 2015-09-10 NOTE — Assessment & Plan Note (Signed)
Bronchospastic cough, cyclical in nature  May do better with lozenges  She does report some improvement with albuterol.

## 2015-09-10 NOTE — Assessment & Plan Note (Signed)
Repeat evaluation in 2017  Last CT May 2016

## 2015-09-10 NOTE — Progress Notes (Signed)
Patient ID: Caitlin Leonard, female    DOB: [birth year], 69 y.o.   MRN: [id number]  HPI Comments: Caitlin Leonard is a 69 year old woman with history of obesity, chronic cough, chronic exertional dyspnea, who presents for routine follow-up of her cough and shortness of breath She has been seen by pulmonary for her exertional shortness of breath and chronic cough. 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 Albuterol seems to make it better. Thanks you took Symbicort in the past, does not remember. She has been taking Lasix every other day. Prior results reviewed with her again today including normal cardiac catheterization at the end of 2015, Essentially normal echocardiogram in 2015. 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 Recent normal BMP She does recall one episode where she went in [place], had a good night out on the town, came home and was very short of breath on exertion.  She did complete a exercise VO2 max study with suggestion she had a circulatory issue. She has completed pulmonary rehabilitation  EKG on today's visit shows normal sinus rhythm with rate 83 bpm, nonspecific ST and T wave abnormality  Other past medical history Lost both of her parents this year 2016, significant stress  Previously seen by Loralie Champagne as well as cardiology through [place] for her symptoms. Symptoms of shortness of breath for more than 6 months, very limiting. No regular exercise as she feels unable to ambulate. Also reports having tachycardia with minimal exertion. Periods of near syncope at times, occasional chest pain   PFTs July 2015 which were normal, chest CT scan May 2015, also again not showing interstitial lung disease Echocardiogram July 2015 showing mildly elevated right heart  pressures, right ventricular systolic pressure of 40 Right and left heart catheterizations November 2015 showing no significant CAD, filling pressures were not elevated Possible sleep apnea Holter monitor has been performed showing APCs and PVCs She has been tried on Lasix daily but felt dehydrated Continues on Advair  Includes carotid ultrasounds from August 2010 showing minimal carotid plaquing  Staged symptom limited exercise treadmill testing implies that she has mild to moderate functional limitation, diastolic dysfunction suggested    Allergies  Allergen Reactions  . Contrast Media [Iodinated Diagnostic Agents] Anaphylaxis  . Banana Nausea And Vomiting    Raw bananas  . Hydroxychloroquine Sulfate Hives  . Oysters [Shellfish Allergy] Nausea And Vomiting    Can eat shrimp but not oysters  . Sulfonamide Derivatives Hives  . Metronidazole Rash    Outpatient Encounter Prescriptions as of 09/10/2015  Medication Sig  . albuterol (PROVENTIL HFA;VENTOLIN HFA) 108 (90 BASE) MCG/ACT inhaler Inhale 1-2 puffs into the lungs every 6 (six) hours as needed for wheezing or shortness of breath.  . ALPRAZolam (XANAX) 0.5 MG tablet Take 1 tablet by mouth at bedtime.  . Azelastine HCl 0.15 % SOLN Place 1 spray into the nose 2 (two) times daily as needed.  . diltiazem (CARDIZEM CD) 180 MG 24 hr capsule Take 1 capsule (180 mg total) by mouth daily.  Marland Kitchen diltiazem (CARDIZEM) 30 MG tablet TAKE 1 TABLET(30 MG) BY MOUTH THREE TIMES DAILY AS NEEDED  . EPIPEN 2-PAK 0.3 MG/0.3ML SOAJ injection as needed.  . fluticasone-salmeterol (ADVAIR HFA) 115-21 MCG/ACT inhaler Inhale 2 puffs into the lungs 2 (two) times daily.  Marland Kitchen  furosemide (LASIX) 20 MG tablet Take 1 tablet (20 mg total) by mouth daily as needed.  Marland Kitchen GARCINIA CAMBOGIA-CHROMIUM PO Take 1 capsule by mouth daily.  Marland Kitchen levocetirizine (XYZAL) 5 MG tablet Take 5 mg by mouth at bedtime.   . Melatonin 10 MG TABS Take 10 mg by mouth daily.  . montelukast  (SINGULAIR) 10 MG tablet Take 1 tablet by mouth daily.  Marland Kitchen NASONEX 50 MCG/ACT nasal spray Place 2 sprays into the nose daily.   . propranolol (INDERAL) 20 MG tablet TAKE 1 TABLET(20 MG) BY MOUTH THREE TIMES DAILY AS NEEDED  . sertraline (ZOLOFT) 100 MG tablet Take 100 mg by mouth 2 (two) times daily.   . simvastatin (ZOCOR) 20 MG tablet Take 20 mg by mouth daily.  . [DISCONTINUED] furosemide (LASIX) 20 MG tablet Take 20 mg by mouth daily as needed.  . [DISCONTINUED] LYRICA 75 MG capsule Take 75 mg by mouth 2 (two) times daily.    No facility-administered encounter medications on file as of 09/10/2015.    Past Medical History  Diagnosis Date  . Hypertension     on medication x 10 years  . Heart murmur     MVP ( symptomatic)  . Dysrhythmia   . Asthma     due to seasonal alllergies  . Sleep apnea 2007    does not use CPAP since 40 # wt loss  . GERD (gastroesophageal reflux disease)   . History of IBS   . Collagenous colitis 2010  . Headache(784.0)     migraines; 2 x month  . Arthritis     osteoarthritis  . Anxiety   . Depression     situational  . Hyperlipemia   . Syncopal episodes   . Cataract   . Shortness of breath     exertional    Past Surgical History  Procedure Laterality Date  . Cardiac catheterization  2012    [facility]  . Lumbar laminectomy  [id number]  . Back surgery    . Tonsillectomy    . Knee arthroscopy  1985, 1990, 2012 x 2    bilateral  . Cholecystectomy  2011  . Nasal sinus surgery  1994  . Abdominal hysterectomy  1979  . Total knee arthroplasty  12/22/2011    Procedure: TOTAL KNEE ARTHROPLASTY;  Surgeon: Rudean Haskell, MD;  Location: [place];  Service: Orthopedics;  Laterality: Left;    Social History  reports that she has never smoked. She has never used smokeless tobacco. She reports that she drinks about 0.6 oz of alcohol per week. She reports that she does not use illicit drugs.  Family History family history includes Arrhythmia in her father;  Diabetes in her mother, paternal grandfather, and paternal uncle; Hyperlipidemia in her father and mother; Hypertension in her father and mother. There is no history of Anesthesia problems.    Review of Systems  Constitutional: Negative.   Respiratory: Positive for cough and shortness of breath.        Bronchospastic cough, occasional sputum  Cardiovascular: Negative.   Gastrointestinal: Negative.   Musculoskeletal: Negative.   Neurological: Negative.   Hematological: Negative.   Psychiatric/Behavioral: Negative.   All other systems reviewed and are negative.   BP 130/86 mmHg  Pulse 83  Ht 5' 8.5" (1.74 m)  Wt 238 lb 12 oz (108.296 kg)  BMI 35.77 kg/m2  Physical Exam  Constitutional: She is oriented to person, place, and time. She appears well-developed and well-nourished.  Obese, bronchospastic cough throughout her  visit  HENT:  Head: Normocephalic.  Nose: Nose normal.  Mouth/Throat: Oropharynx is clear and moist.  Eyes: Conjunctivae are normal. Pupils are equal, round, and reactive to light.  Neck: Normal range of motion. Neck supple. No JVD present.  Cardiovascular: Normal rate, regular rhythm, S1 normal, S2 normal, normal heart sounds and intact distal pulses.  Exam reveals no gallop and no friction rub.   No murmur heard. Pulmonary/Chest: Effort normal. No respiratory distress. She has wheezes. She has no rales. She exhibits no tenderness.  Very scant expiratory wheeze  Abdominal: Soft. Bowel sounds are normal. She exhibits no distension. There is no tenderness.  Musculoskeletal: Normal range of motion. She exhibits no edema or tenderness.  Lymphadenopathy:    She has no cervical adenopathy.  Neurological: She is alert and oriented to person, place, and time. Coordination normal.  Skin: Skin is warm and dry. No rash noted. No erythema.  Psychiatric: She has a normal mood and affect. Her behavior is normal. Judgment and thought content normal.    Assessment and Plan   Nursing note and vitals reviewed.

## 2015-09-10 NOTE — Assessment & Plan Note (Signed)
We have encouraged continued exercise, careful diet management in an effort to lose weight. 

## 2015-09-10 NOTE — Assessment & Plan Note (Signed)
Calcifications noted on CT scan, recent cardiac catheterization 2015 with no significant disease  encouraged her to stay on her simvastatin

## 2015-09-10 NOTE — Patient Instructions (Signed)
You are doing well.  Please increase lasix up to daily Plus take extra lasix (total of 40 mg) for any days with shortness of breath  Please call us if you have new issues that need to be addressed before your next appt.  Your physician wants you to follow-up in: 6 months.  You will receive a reminder letter in the mail two months in advance. If you don't receive a letter, please call our office to schedule the follow-up appointment.

## 2015-09-27 ENCOUNTER — Ambulatory Visit: Payer: Medicare PPO | Admitting: Internal Medicine

## 2015-11-14 ENCOUNTER — Encounter: Payer: Self-pay | Admitting: Internal Medicine

## 2015-11-14 ENCOUNTER — Ambulatory Visit (INDEPENDENT_AMBULATORY_CARE_PROVIDER_SITE_OTHER): Payer: Medicare Other | Admitting: Internal Medicine

## 2015-11-14 VITALS — BP 144/92 | HR 83 | Ht 68.5 in | Wt 230.4 lb

## 2015-11-14 DIAGNOSIS — R911 Solitary pulmonary nodule: Secondary | ICD-10-CM | POA: Diagnosis not present

## 2015-11-14 DIAGNOSIS — J387 Other diseases of larynx: Secondary | ICD-10-CM | POA: Diagnosis not present

## 2015-11-14 DIAGNOSIS — R05 Cough: Secondary | ICD-10-CM

## 2015-11-14 DIAGNOSIS — R053 Chronic cough: Secondary | ICD-10-CM

## 2015-11-14 LAB — NITRIC OXIDE: Nitric Oxide: 16

## 2015-11-14 NOTE — Patient Instructions (Addendum)
ICD-9-CM ICD-10-CM   1. Chronic cough 786.2 R05   2. Nodule of right lung 793.11 R91.1   3. 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

## 2015-11-14 NOTE — Progress Notes (Signed)
Subjective:     Patient ID: Caitlin Leonard, female   DOB: [birth year], 70 y.o.   MRN: [id number]  HPI    IOV 02/24/14    New consult for chronic cough  70 year old  female previously well. A year ago in April 2014 approximately she started doing some gardening by removing  Weeds  one day and then abruptly she developed cough possibly with fever and green sputum production. Apparently chest x-ray at that time showed pneumonia. She was treated with antibiotics. And possibly prednisone at that time. Cough subsequently resolved in 10 weeks. She did have allergy workup in June 2014 that showed significant positive reaction for mixed Aspergillus and mold mix. To my knowledge she was not treated with allergy shots. She was maintained on steroid inhalers and after 10 weeks she started feeling well although she did have some mild basal chronic cough.  Then again on 01/04/2014 she went to the garden and removed chick weeds and then abruptly fell sick with fever, green sputum. This time apparently the chest x-ray was normal. She saw Dr. Raul Del in [place]. Dr. Raul Del is a pulmonologist. Apparently was diagnosed with asthmatic bronchitis. Unclear she had a spirometry. She was maintained on prednisone but within 4 weeks again tremendous weight so she quit taking this several weeks ago. She now rates that a chest x-ray was clear. Initially cough was productive but subsequently has become dry. Initially cough was severe but currently rated as moderate. Cough has definitely improved with the passage of time but still significant enough. Initially running low-grade fever for several weeks but subsequently resolved. Cough is associated with fatigue, ticklish sensation in the throat and occasional gag. She denies any acid reflux. Cough somewhat improved by cough suppressants. Significantly impact quality of life.   Note chronic sinus issues- seen dr [facility] in [place] few years ago. S/p surgery in  past   Of note, she is known to have psoriatic arthritis and apparently was considered for TNF alpha agents but because of the above they were not started. She is on [place] for this. Uptodate says: Phosphodiesterase-4 Enzyme Inhibitor. Respiratory: Upper respiratory tract infection (8%), nasopharyngitis (7%), sinusitis (2%), bronchitis (<2%), cough (<2%), pharyngitis (<2%), rhinitis (<2%), sinus headache (<2%).  Infection: Use caution in patients with latent infections such as tuberculosis, viral hepatitis, herpes viral infection and herpes zoster (limited experience). Use in patients with severe immunological diseases, severe acute infectious diseases, or psoriasis patients treated with immunosuppressive therapy has not been evaluated.  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   Cough is from sinus drainage, possible acid reflux, possible asthma or Hypersensitivity Pneumonitis All of this is working together to cause cyclical cough/LPR cough and made worse by talking  #Sinus drainage /Sinus allergies  - start netti pot saline wash three times a week - continue singulair and nasonex as before   #Possible Acid Reflux  - take otc zegerid 69m  1 capsule daily on empty stomach    -  At all times avoid colas, spices, cheeses, spirits, red meats, beer, chocolates, fried foods etc.,   - sleep with head end of bed elevated  - eat small frequent meals  - do not go to bed for 3 hours after last meal  #Possible Asthma  - do PFT breathing test in 4 weeks  #Possible Hypersensitivity Pneumonitis  - do blood test for mold  - do HRCT chest    #Cyclical  cough/Irritable Larynx  - please choose 2-3 days and observe complete voice rest - no talking or whispering  - at all times there  there is urge to cough, drink water or swallow or sip on throat lozenge - do tussionex 41m Twice daily x 3 days during this period  #Followup - I will see you  in 4 weeksl cough score at followup - any problems call or come sooner   OV 04/17/2014  Chief Complaint  Patient presents with  . Follow-up    With PFT results. Pt states she has DOE, productive cough with white and yellow mucous and wheezing. Denies CP/tightness.    FU chronic cough  -At last visit we focused on treating the sinus, acid reflux issues and simple effective measures for irritable larynx syndrome. The same time we did initiate some basic cough investigation . I am not so sure she did any of the treatment measures I recommended. In fact on medication list review I notice that she is on Advair now but then she tells me that she's been on it for years. I also noticed that she is on Prilosec although I recommended Zegerid and she doesn't initiated on Prilosec for years.  But she stated that the cough resolved but a month ago she went and did some gardening again and the cough is relapsed. Currently cough is moderate and associated with a lot of clearing of the throat and hoarse voice. Relevant investigations are listed below. The CT scan of the chest also has incidental findings that are listed below  Hypersensitivity Blood tests profile 02/24/2014 is negative  CT chest 03/03/2014  IMPRESSION:  1. No evidence of interstitial lung disease or hypersensitivity  pneumonitis. No findings to explain the patient's chronic cough.  2. Small ascending aortic aneurysm.  3. Three-vessel coronary artery calcification.  4. Clustered peribronchovascular nodularity in the right lower lobe,  superior segment (series 5, image 23) is likely post infectious in  etiology  Electronically Signed  By: MLorin PicketM.D.  On: 03/03/2014 11:23   Pulmonary function test 04/17/2014 is essentially normal. Diffusion capacity is 20.6/71% and mildly reduced but the CT scan of the chest does not show any evidence of emphysema or interstitial lung disease  #Coronary ARtery calcification and Aortica  Aneurysm  - sign release to get CD rom of our CT chest and please follow with Dr [place]artholome [place] # Chronic Cough   - too bad is back  - Cough is from sinus drainage, possible acid reflux, possible asthma  All of this is working together to cause cyclical cough/LPR cough or irritable larynx and made worse by talking  #Sinus drainage /Sinus allergies  -continue netti pot saline wash three times a week - continue singulair and nasonex as before  #Possible Acid Reflux  - continue PPI as before on empty stomach    -  At all times avoid colas, spices, cheeses, spirits, red meats, beer, chocolates, fried foods etc.,   - sleep with head end of bed elevated  - eat small frequent meals  - do not go to bed for 3 hours after last meal  #Possible Asthma  - continue advair as before   #Cyclical cough/Irritable Larynx  - you will benefit from nuerontin and speech therapy but have agreed you want to see how next 8 weeks evolve  #Health Maintenance  - PREVNAR Vaccine today   -#Followup - I will see you in 8 weeksl cough score at followup -  any problems call or come sooner   OV 06/13/2014  Chief Complaint  Patient presents with  . Follow-up    Pt states her breathing is unchanged since last OV. Pt c/o DOE. Pt states her cough has improved. Pt denies CP/tightness.    FU cough: this is mych better. RSI cough score is 9. This is after she followeed sinus, gerd and airway Rx compliantly. STill complaints of hoarse voice after she is tired. Last visit with ENT Was few years ago; wants to defer followup now  RLL peribronchial nodularity - seen on CT may 2015. Will do fu May 2016  Dyspnea: reports insidious onset of chronic dyspnea on exertion, class 2 relieved by rest. PEr Hx cardiac stress test is negative. Not progressive but significant. Not responding to advair. CT chest not explaining dyspnea. Assoicated obesity +. She wants more testing. No ass chest pain/.       OV 07/20/2014  Chief Complaint  Patient presents with  . Follow-up    Pt states she is not wheezing like before. Pt states she feels like she is improving. Pt here after CPST. Pt c/o DOE, mild prod cough with white/yellow mucus. Pt denies CP/tightness.     CPST fu for dyspjnea   - CPST shows redueced Vo2 max 65% and for IBW at 75%., Vo2 max is ok at 45%, Adequate effort. Normal Ventilation. Normal HRR response. No EIB. PRoblem is flat o2 pulse and stroke volume limitation all c/w circulatory limitation. She tells me stress test cardiac this summer 2015 by Dr Lana Fish is normal. Suspect diastolic dysfn  - no new issues  OV 02/23/2015  Chief Complaint  Patient presents with  . Follow-up    Pt c/o non-productive cough since 09/2014. Pt states cough not getting any better. Denies f/c/s, n/v     Follow-up chronic cough and pulmonary nodule  # Pulmonary nodule  CT chest May 2016 compared to may 2015 - personally visualized image IMPRESSION: 1. Stable small pulmonary nodules compatible with a benign process. 2. Stable appearance of ascending aorta measuring 4 cm. 3. Aortic atherosclerosis and 3 vessel Coronary artery calcification. Electronically Signed  By: Kerby Moors M.D.  On: 02/20/2015 11:04   #Chronic cough - In the fall of 2015 this improved very minimal levels. She says that in December 2015 she went to [place] to see her parents and when she got back she picked up a cold and since then she's had significant severe cough. Cough is rated as severe. It is persistent. It is present both day and night. It is worse during the daytime. Talking and daytime make it worse. Nighttime and being quiet and drinking water make it better. There is associated voice hoarseness and wheezing but it appears that wheezing is mostly coming from the upper airway. There is associated ticklishness in the throat and laryngeal quality to the voice. There is a feeling of post nasal  drip. She is not doing her Advair anymore. She's been started on propranolol a few weeks ago by her car new cardiologist Dr. Ida Rogue (she has propranolol listed as an allergy but she denies this]. In addition a week ago her rheumatologist Dr. Amil Amen started her on Lyrica low-dose for neuropathic pain. She is open to switching this to gabapentin for her chronic cough. Which can also help neuropathic pain. Apparently because of a cough she is not getting her allergy shots anymore   Exhaled NO FeNO at this visit is 21 and argues against eosinophilic asthma  OV 11/14/2015  Chief Complaint  Patient presents with  . Follow-up    Pt c/o SOB unchanged since last OV - increased from baseline. Pt states her cough is also unchanged - with yellow mucus and chest tightness.     Follow-up chronic cough. Last seen by myself in May 2016. At that time CT chest was normal. Exhaled nitric oxide was normal. I asked her to stop her Lyrica and tried her on gabapentin. She now tells me that she only tried gabapentin for a week and stopped because she did not see any result. She also attended speech therapy voice rehabilitation classes but this did not help her cough. She continues to complain of severe persistent cough that is dry with a laryngeal quality. Sinus inhalers and acid reflux treatment and pulmonary inhalers especially inhale steroids have not helped. The only thing that helps her as an albuterol rescue inhaler. High-resolution CT chest 2 years ago and approximately one year ago a normal CT chest without contrast does not show any interstitial lung disease. She is very frustrated by her cough. She continues to be on propranolol. RSI cough score is 30 and reflcts LPR cough. Repeat feNO is 16 ppb and against eos. Airway issues  Dr Lorenza Cambridge Reflux Symptom Index (> 13-15 suggestive of LPR cough) 0 -> 5  =  none ->severe problem 02/24/2014  06/13/2014 After compliance 11/14/2015   Hoarseness of problem with  voice _0 Clearing  Of Throat _1 Excess throat mucus or feeling of post nasal drip 5 0 4  Difficulty swallowing food, liquid or tablets 1 0 2  Cough after eating or lying down _2 Breathing difficulties or choking episodes _3 Troublesome or annoying cough _4 Sensation of something sticking in throat or lump in throat 1 0 3  Heartburn, chest pain, indigestion, or stomach acid coming up 1 0 4  TOTAL _5 Current outpatient prescriptions:  .  albuterol (PROVENTIL HFA;VENTOLIN HFA) 108 (90 BASE) MCG/ACT inhaler, Inhale 1-2 puffs into the lungs every 6 (six) hours as needed for wheezing or shortness of breath., Disp: 1 Inhaler, Rfl: 3 .  ALPRAZolam (XANAX) 0.5 MG tablet, Take 1 tablet by mouth at bedtime., Disp: , Rfl:  .  Azelastine HCl 0.15 % SOLN, Place 1 spray into the nose 2 (two) times daily as needed., Disp: 30 mL, Rfl: 1 .  diltiazem (CARDIZEM CD) 180 MG 24 hr capsule, Take 1 capsule (180 mg total) by mouth daily., Disp: 90 capsule, Rfl: 3 .  diltiazem (CARDIZEM) 30 MG tablet, TAKE 1 TABLET(30 MG) BY MOUTH THREE TIMES DAILY AS NEEDED (Patient taking differently: TAKE 1 TABLET(30 MG) BY MOUTH DAILY), Disp: 90 tablet, Rfl: 3 .  EPIPEN 2-PAK 0.3 MG/0.3ML SOAJ injection, as needed., Disp: , Rfl:  .  fluticasone-salmeterol (ADVAIR HFA) 115-21 MCG/ACT inhaler, Inhale 2 puffs into the lungs 2 (two) times daily., Disp: , Rfl:  .  furosemide (LASIX) 20 MG tablet, Take 1 tablet (20 mg total) by mouth daily as needed., Disp: 90 tablet, Rfl: 3 .  GARCINIA CAMBOGIA-CHROMIUM PO, Take 1 capsule by mouth daily., Disp: , Rfl:  .  Melatonin 10 MG TABS, Take 10 mg by mouth daily., Disp: , Rfl:  .  montelukast (SINGULAIR) 10 MG tablet, Take 1 tablet by mouth daily., Disp: , Rfl:  .  propranolol (INDERAL) 20 MG tablet, TAKE 1 TABLET(20 MG) BY MOUTH  THREE TIMES DAILY AS NEEDED (Patient taking differently: TAKE 1 TABLET(20 MG) BY MOUTH DAILY), Disp: 90 tablet, Rfl: 3 .  sertraline  (ZOLOFT) 100 MG tablet, Take 100 mg by mouth 2 (two) times daily. , Disp: , Rfl:  .  simvastatin (ZOCOR) 20 MG tablet, Take 20 mg by mouth daily., Disp: , Rfl:   Allergies  Allergen Reactions  . Contrast Media [Iodinated Diagnostic Agents] Anaphylaxis  . Banana Nausea And Vomiting    Raw bananas  . Hydroxychloroquine Sulfate Hives  . Oysters [Shellfish Allergy] Nausea And Vomiting    Can eat shrimp but not oysters  . Sulfonamide Derivatives Hives  . Metronidazole Rash    Immunization History  Administered Date(s) Administered  . Influenza Split 07/06/2013, 06/07/2014  . Influenza,inj,Quad PF,36+ Mos 06/07/2015  . Pneumococcal Conjugate-13 05/06/2014  . Pneumococcal Polysaccharide-23 10/06/2008  . Td 10/06/2006  . Zoster 10/06/2008     Review of Systems according to history of present illness    Objective:   Physical Exam  Constitutional: She is oriented to person, place, and time. She appears well-developed and well-nourished. No distress.  HENT:  Head: Normocephalic and atraumatic.  Right Ear: External ear normal.  Left Ear: External ear normal.  Mouth/Throat: Oropharynx is clear and moist. No oropharyngeal exudate.  Repeatedly coughing with a laryngeal quality  Eyes: Conjunctivae and EOM are normal. Pupils are equal, round, and reactive to light. Right eye exhibits no discharge. Left eye exhibits no discharge. No scleral icterus.  Neck: Normal range of motion. Neck supple. No JVD present. No tracheal deviation present. No thyromegaly present.  Cardiovascular: Normal rate, regular rhythm, normal heart sounds and intact distal pulses.  Exam reveals no gallop and no friction rub.   No murmur heard. Pulmonary/Chest: Effort normal and breath sounds normal. No respiratory distress. She has no wheezes. She has no rales. She exhibits no tenderness.  Abdominal: Soft. Bowel sounds are normal. She exhibits no distension and no mass. There is no tenderness. There is no rebound and no  guarding.  Musculoskeletal: Normal range of motion. She exhibits no edema or tenderness.  Lymphadenopathy:    She has no cervical adenopathy.  Neurological: She is alert and oriented to person, place, and time. She has normal reflexes. No cranial nerve deficit. She exhibits normal muscle tone. Coordination normal.  Skin: Skin is warm and dry. No rash noted. She is not diaphoretic. No erythema. No pallor.  Psychiatric: She has a normal mood and affect. Her behavior is normal. Judgment and thought content normal.  Vitals reviewed.   Filed Vitals:   11/14/15 1550  BP: 144/92  Pulse: 83  Height: 5' 8.5" (1.74 m)  Weight: 230 lb 6.4 oz (104.509 kg)  SpO2: 97%        Assessment:       ICD-9-CM ICD-10-CM   1. Chronic cough 786.2 R05   2. Nodule of right lung 793.11 R91.1   3. 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 So far no etiology. 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 Best to start from [employer] and reevaluate in detail    Plan:      Unclear why you have persistent chronic cough  Plan  m- do cough score before you leave - Do high resolution CT chest without contrast - if insurance denies then we will try to do this in May 2017 - Do sinus CT without contrast - Do exhaled nitric oxide testing  Follow-up  - Return to  see me after you complete the above tests. If the tests are negative then given failure with gabapentin/Lyrica and moist rehabilitation I will probably talk to her about referral to [facility] for second opinion  > 50% of this > 25 min visit spent in face to face counseling or coordination of care     Dr. Brand Males, M.D., [facility].C.P Pulmonary and Critical Care Medicine Staff Physician [facility] Pager: [phone], If no answer or between  15:00h - 7:00h: call [phone]  11/14/2015 4:15 PM

## 2015-11-20 ENCOUNTER — Ambulatory Visit: Payer: Medicare Other

## 2015-11-20 ENCOUNTER — Ambulatory Visit
Admission: RE | Admit: 2015-11-20 | Discharge: 2015-11-20 | Disposition: A | Discharge status: Home / Self Care | Payer: Medicare Other | Source: Ambulatory Visit | Attending: Internal Medicine | Admitting: Internal Medicine

## 2015-11-20 DIAGNOSIS — R911 Solitary pulmonary nodule: Secondary | ICD-10-CM | POA: Diagnosis present

## 2015-11-20 DIAGNOSIS — R05 Cough: Secondary | ICD-10-CM | POA: Insufficient documentation

## 2015-11-20 DIAGNOSIS — J387 Other diseases of larynx: Secondary | ICD-10-CM

## 2015-11-20 DIAGNOSIS — I2510 Atherosclerotic heart disease of native coronary artery without angina pectoris: Secondary | ICD-10-CM | POA: Insufficient documentation

## 2015-11-20 DIAGNOSIS — R053 Chronic cough: Secondary | ICD-10-CM

## 2015-11-21 ENCOUNTER — Encounter: Payer: Self-pay | Admitting: Internal Medicine

## 2015-11-21 ENCOUNTER — Ambulatory Visit (INDEPENDENT_AMBULATORY_CARE_PROVIDER_SITE_OTHER): Payer: Medicare Other | Admitting: Internal Medicine

## 2015-11-21 VITALS — BP 144/92 | HR 67 | Ht 68.5 in | Wt 233.2 lb

## 2015-11-21 DIAGNOSIS — J387 Other diseases of larynx: Secondary | ICD-10-CM

## 2015-11-21 DIAGNOSIS — R05 Cough: Secondary | ICD-10-CM

## 2015-11-21 DIAGNOSIS — R053 Chronic cough: Secondary | ICD-10-CM

## 2015-11-21 MED ORDER — GABAPENTIN 300 MG PO CAPS: ORAL_CAPSULE | ORAL | Status: DC

## 2015-11-21 NOTE — Patient Instructions (Signed)
ICD-9-CM ICD-10-CM   1. Chronic cough 786.2 R05   2. 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 If this makes you too sleepy or drowsy call us and we will cut your medication dosing down  Followup in 4-6 weeks with myself or Tammy Parrett or [facility] in [place], [place] - Dr Ladon Applebaum - let me know and I can arrange

## 2015-11-21 NOTE — Progress Notes (Signed)
Subjective:     Patient ID: Caitlin Leonard, female   DOB: [birth year], 70 y.o.   MRN: [id number]  HPI    IOV 02/24/14    New consult for chronic cough  70 year old  female previously well. A year ago in April 2014 approximately she started doing some gardening by removing  Weeds  one day and then abruptly she developed cough possibly with fever and green sputum production. Apparently chest x-ray at that time showed pneumonia. She was treated with antibiotics. And possibly prednisone at that time. Cough subsequently resolved in 10 weeks. She did have allergy workup in June 2014 that showed significant positive reaction for mixed Aspergillus and mold mix. To my knowledge she was not treated with allergy shots. She was maintained on steroid inhalers and after 10 weeks she started feeling well although she did have some mild basal chronic cough.  Then again on 01/04/2014 she went to the garden and removed chick weeds and then abruptly fell sick with fever, green sputum. This time apparently the chest x-ray was normal. She saw Dr. Raul Del in [place]. Dr. Raul Del is a pulmonologist. Apparently was diagnosed with asthmatic bronchitis. Unclear she had a spirometry. She was maintained on prednisone but within 4 weeks again tremendous weight so she quit taking this several weeks ago. She now rates that a chest x-ray was clear. Initially cough was productive but subsequently has become dry. Initially cough was severe but currently rated as moderate. Cough has definitely improved with the passage of time but still significant enough. Initially running low-grade fever for several weeks but subsequently resolved. Cough is associated with fatigue, ticklish sensation in the throat and occasional gag. She denies any acid reflux. Cough somewhat improved by cough suppressants. Significantly impact quality of life.   Note chronic sinus issues- seen dr [facility] in [place] few years ago. S/p surgery in  past   Of note, she is known to have psoriatic arthritis and apparently was considered for TNF alpha agents but because of the above they were not started. She is on [place] for this. Uptodate says: Phosphodiesterase-4 Enzyme Inhibitor. Respiratory: Upper respiratory tract infection (8%), nasopharyngitis (7%), sinusitis (2%), bronchitis (<2%), cough (<2%), pharyngitis (<2%), rhinitis (<2%), sinus headache (<2%).  Infection: Use caution in patients with latent infections such as tuberculosis, viral hepatitis, herpes viral infection and herpes zoster (limited experience). Use in patients with severe immunological diseases, severe acute infectious diseases, or psoriasis patients treated with immunosuppressive therapy has not been evaluated.  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   Cough is from sinus drainage, possible acid reflux, possible asthma or Hypersensitivity Pneumonitis All of this is working together to cause cyclical cough/LPR cough and made worse by talking  #Sinus drainage /Sinus allergies  - start netti pot saline wash three times a week - continue singulair and nasonex as before   #Possible Acid Reflux  - take otc zegerid 69m  1 capsule daily on empty stomach    -  At all times avoid colas, spices, cheeses, spirits, red meats, beer, chocolates, fried foods etc.,   - sleep with head end of bed elevated  - eat small frequent meals  - do not go to bed for 3 hours after last meal  #Possible Asthma  - do PFT breathing test in 4 weeks  #Possible Hypersensitivity Pneumonitis  - do blood test for mold  - do HRCT chest    #Cyclical  cough/Irritable Larynx  - please choose 2-3 days and observe complete voice rest - no talking or whispering  - at all times there  there is urge to cough, drink water or swallow or sip on throat lozenge - do tussionex 41m Twice daily x 3 days during this period  #Followup - I will see you  in 4 weeksl cough score at followup - any problems call or come sooner   OV 04/17/2014  Chief Complaint  Patient presents with  . Follow-up    With PFT results. Pt states she has DOE, productive cough with white and yellow mucous and wheezing. Denies CP/tightness.    FU chronic cough  -At last visit we focused on treating the sinus, acid reflux issues and simple effective measures for irritable larynx syndrome. The same time we did initiate some basic cough investigation . I am not so sure she did any of the treatment measures I recommended. In fact on medication list review I notice that she is on Advair now but then she tells me that she's been on it for years. I also noticed that she is on Prilosec although I recommended Zegerid and she doesn't initiated on Prilosec for years.  But she stated that the cough resolved but a month ago she went and did some gardening again and the cough is relapsed. Currently cough is moderate and associated with a lot of clearing of the throat and hoarse voice. Relevant investigations are listed below. The CT scan of the chest also has incidental findings that are listed below  Hypersensitivity Blood tests profile 02/24/2014 is negative  CT chest 03/03/2014  IMPRESSION:  1. No evidence of interstitial lung disease or hypersensitivity  pneumonitis. No findings to explain the patient's chronic cough.  2. Small ascending aortic aneurysm.  3. Three-vessel coronary artery calcification.  4. Clustered peribronchovascular nodularity in the right lower lobe,  superior segment (series 5, image 23) is likely post infectious in  etiology  Electronically Signed  By: MLorin PicketM.D.  On: 03/03/2014 11:23   Pulmonary function test 04/17/2014 is essentially normal. Diffusion capacity is 20.6/71% and mildly reduced but the CT scan of the chest does not show any evidence of emphysema or interstitial lung disease  #Coronary ARtery calcification and Aortica  Aneurysm  - sign release to get CD rom of our CT chest and please follow with Dr KBartholome [place] # Chronic Cough   - too bad is back  - Cough is from sinus drainage, possible acid reflux, possible asthma  All of this is working together to cause cyclical cough/LPR cough or irritable larynx and made worse by talking  #Sinus drainage /Sinus allergies  -continue netti pot saline wash three times a week - continue singulair and nasonex as before  #Possible Acid Reflux  - continue PPI as before on empty stomach    -  At all times avoid colas, spices, cheeses, spirits, red meats, beer, chocolates, fried foods etc.,   - sleep with head end of bed elevated  - eat small frequent meals  - do not go to bed for 3 hours after last meal  #Possible Asthma  - continue advair as before   #Cyclical cough/Irritable Larynx  - you will benefit from nuerontin and speech therapy but have agreed you want to see how next 8 weeks evolve  #Health Maintenance  - PREVNAR Vaccine today   -#Followup - I will see you in 8 weeksl cough score at followup -  any problems call or come sooner   OV 06/13/2014  Chief Complaint  Patient presents with  . Follow-up    Pt states her breathing is unchanged since last OV. Pt c/o DOE. Pt states her cough has improved. Pt denies CP/tightness.    FU cough: this is mych better. RSI cough score is 9. This is after she followeed sinus, gerd and airway Rx compliantly. STill complaints of hoarse voice after she is tired. Last visit with ENT Was few years ago; wants to defer followup now  RLL peribronchial nodularity - seen on CT may 2015. Will do fu May 2016  Dyspnea: reports insidious onset of chronic dyspnea on exertion, class 2 relieved by rest. PEr Hx cardiac stress test is negative. Not progressive but significant. Not responding to advair. CT chest not explaining dyspnea. Assoicated obesity +. She wants more testing. No ass chest pain/.       OV 07/20/2014  Chief Complaint  Patient presents with  . Follow-up    Pt states she is not wheezing like before. Pt states she feels like she is improving. Pt here after CPST. Pt c/o DOE, mild prod cough with white/yellow mucus. Pt denies CP/tightness.     CPST fu for dyspjnea   - CPST shows redueced Vo2 max 65% and for IBW at 75%., Vo2 max is ok at 45%, Adequate effort. Normal Ventilation. Normal HRR response. No EIB. PRoblem is flat o2 pulse and stroke volume limitation all c/w circulatory limitation. She tells me stress test cardiac this summer 2015 by Dr Lana Fish is normal. Suspect diastolic dysfn  - no new issues  OV 02/23/2015  Chief Complaint  Patient presents with  . Follow-up    Pt c/o non-productive cough since 09/2014. Pt states cough not getting any better. Denies f/c/s, n/v     Follow-up chronic cough and pulmonary nodule  # Pulmonary nodule  CT chest May 2016 compared to may 2015 - personally visualized image IMPRESSION: 1. Stable small pulmonary nodules compatible with a benign process. 2. Stable appearance of ascending aorta measuring 4 cm. 3. Aortic atherosclerosis and 3 vessel Coronary artery calcification. Electronically Signed  By: Kerby Moors M.D.  On: 02/20/2015 11:04   #Chronic cough - In the fall of 2015 this improved very minimal levels. She says that in December 2015 she went to [place] to see her parents and when she got back she picked up a cold and since then she's had significant severe cough. Cough is rated as severe. It is persistent. It is present both day and night. It is worse during the daytime. Talking and daytime make it worse. Nighttime and being quiet and drinking water make it better. There is associated voice hoarseness and wheezing but it appears that wheezing is mostly coming from the upper airway. There is associated ticklishness in the throat and laryngeal quality to the voice. There is a feeling of post nasal  drip. She is not doing her Advair anymore. She's been started on propranolol a few weeks ago by her car new cardiologist Dr. Ida Rogue (she has propranolol listed as an allergy but she denies this]. In addition a week ago her rheumatologist Dr. Amil Amen started her on Lyrica low-dose for neuropathic pain. She is open to switching this to gabapentin for her chronic cough. Which can also help neuropathic pain. Apparently because of a cough she is not getting her allergy shots anymore   Exhaled NO FeNO at this visit is 21 and argues against eosinophilic asthma  OV 11/14/2015  Chief Complaint  Patient presents with  . Follow-up    Pt c/o SOB unchanged since last OV - increased from baseline. Pt states her cough is also unchanged - with yellow mucus and chest tightness.     Follow-up chronic cough. Last seen by myself in May 2016. At that time CT chest was normal. Exhaled nitric oxide was normal. I asked her to stop her Lyrica and tried her on gabapentin. She now tells me that she only tried gabapentin for a week and stopped because she did not see any result. She also attended speech therapy voice rehabilitation classes but this did not help her cough. She continues to complain of severe persistent cough that is dry with a laryngeal quality. Sinus inhalers and acid reflux treatment and pulmonary inhalers especially inhale steroids have not helped. The only thing that helps her as an albuterol rescue inhaler. High-resolution CT chest 2 years ago and approximately one year ago a normal CT chest without contrast does not show any interstitial lung disease. She is very frustrated by her cough. She continues to be on propranolol. RSI cough score is 30 and reflcts LPR cough. Repeat feNO is 16 ppb and against eos. Airway issues    OV 11/21/2015  Chief Complaint  Patient presents with  . Follow-up    Pt here after CT chest and sinus. Pt states her cough and breathing is unchanged since last OV (2.8.17).    Follow-up refractory chronic cough. She is here to discuss CT chest and CT sinus results. These are personally visualized and reported below. They essentially normal. Her cough persists. At this point in time she is not on Lyrica. She reports that she tried gabapentin for 1 week and give up because of intolerance but at this point in time she was on Lyrica as well. She is willing to try this again. If the cough is refractory she wants to consider referral to [facility] in [place]. She's not interested in speech voice rehabilitation because she tried this and it did not work.  There are no other new issues.  Ct Chest High Resolution  11/20/2015  CLINICAL DATA:  Chronic productive cough for 14 months with recent shortness of breath. Reported history of right pulmonary nodule. Never smoker. EXAM: CT CHEST WITHOUT CONTRAST TECHNIQUE: Multidetector CT imaging of the chest was performed following the standard protocol without intravenous contrast. High resolution imaging of the lungs, as well as inspiratory and expiratory imaging, was performed. COMPARISON:  03/09/2013 chest CT.  12/30/2014 chest radiograph. FINDINGS: Mediastinum/Nodes: Normal heart size. No pericardial fluid/thickening. Coronary atherosclerosis. Great vessels are normal in course and caliber. Normal visualized thyroid. Normal esophagus. No pathologically enlarged axillary, mediastinal or gross hilar lymph nodes, noting limited sensitivity for the detection of hilar adenopathy on this noncontrast study. Lungs/Pleura: No pneumothorax. No pleural effusion. No acute consolidative airspace disease, significant pulmonary nodules or lung masses. No significant regions of subpleural reticulation, ground-glass attenuation, traction bronchiectasis, architectural distortion, parenchymal banding or frank honeycombing. There is a solitary curvilinear parenchymal band in the lingula, probably a site of postinfectious/postinflammatory scarring.  Mild scattered air trapping on the expiration sequence. Upper abdomen: Unremarkable. Musculoskeletal: No aggressive appearing focal osseous lesions. Mild degenerative changes in the thoracic spine. IMPRESSION: 1. Mild air trapping, suggesting small airways disease. 2. No evidence of interstitial lung disease. 3. Coronary atherosclerosis. Electronically Signed   By: Ilona Sorrel M.D.   On: 11/20/2015 14:36   [place]  11/20/2015  CLINICAL DATA:  SINUS CONGESTION AND DRAINAGE. LEFT FACIAL PAIN FOR 6 WEEKS. EXAM: CT PARANASAL SINUS LIMITED WITHOUT CONTRAST TECHNIQUE: Non-contiguous multidetector CT images of the paranasal sinuses were obtained in a single plane without contrast. COMPARISON:  None. FINDINGS: PARANASAL SINUSES ARE CLEAR. NO MUCOSAL THICKENING. NO AIR-FLUID LEVELS. OSTIOMEATAL COMPLEXES ARE PATENT BILATERALLY. ORBITAL SOFT TISSUES ARE UNREMARKABLE. IMPRESSION: NO EVIDENCE OF SINUSITIS. Electronically Signed   By: Rolm Baptise M.D.   On: 11/20/2015 14:54      Dr Lorenza [place] Reflux Symptom Index (> 13-15 suggestive of LPR cough) 0 -> 5  =  none ->severe problem 02/24/2014  06/13/2014 After compliance 11/14/2015   Hoarseness of problem with voice '5 3 2  '$ Clearing  Of Throat '5 1 2  '$ Excess throat mucus or feeling of post nasal drip 5 0 4  Difficulty swallowing food, liquid or tablets 1 0 2  Cough after eating or lying down '4 3 4  '$ Breathing difficulties or choking episodes '2 1 4  '$ Troublesome or annoying cough '5 1 5  '$ Sensation of something sticking in throat or lump in throat 1 0 3  Heartburn, chest pain, indigestion, or stomach acid coming up 1 0 4  TOTAL '29 9 30   '$ Review of Systems     Objective:   Physical Exam Discussion only visit    Assessment:       ICD-9-CM ICD-10-CM   1. Chronic cough 786.2 R05   2. 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 She is fully aware. Because she's not  on Lyrica she is willing to try gabapentin. If this fails she will take a referral to [facility]. I have recommended that she visit with Dr. Judie Grieve who is that expired on cough. She is agreeable. We discussed side effects of gabapentin and she will monitor for these  (> 50% of this 15 min visit spent in face to face counseling or/and coordination of care)   Dr. Brand Males, M.D., [facility].C.P Pulmonary and Critical Care Medicine Staff Physician [facility] Pager: [phone], If no answer or between  15:00h - 7:00h: call [phone]  11/21/2015 4:57 PM

## 2015-11-22 NOTE — Telephone Encounter (Signed)
MR please advise. Thanks! 

## 2015-11-23 ENCOUNTER — Encounter: Payer: Self-pay | Admitting: Internal Medicine

## 2015-11-23 NOTE — Telephone Encounter (Signed)
MR please advise on pt's questions.  Thanks!

## 2015-11-25 ENCOUNTER — Encounter: Payer: Self-pay | Admitting: Cardiovascular Disease

## 2015-11-26 NOTE — Telephone Encounter (Signed)
Spoke with patient regarding her recent chest CT and shortness of breath. Per previous instructions back in December we discussed her taking extra lasix. She verbalized understanding. Instructed her to please call back if she continues to have any problems and we may be able to get her back in sooner to see the physician. She agreed with this plan and no further questions at this time.

## 2015-12-04 ENCOUNTER — Encounter: Payer: Self-pay | Admitting: Cardiovascular Disease

## 2015-12-04 ENCOUNTER — Telehealth: Payer: Self-pay | Admitting: Cardiovascular Disease

## 2015-12-04 NOTE — Telephone Encounter (Signed)
Pt c/o BP issue: STAT if pt c/o blurred vision, one-sided weakness or slurred speech  1. What are your last 5 BP readings? [facility] at [facility] should have them documented  2. Are you having any other symptoms (ex. Dizziness, headache, blurred vision, passed out)? Yes dizziness with terrible headaches (0-10 scale she is at a 4)  3. What is your BP issue? It is very up and down

## 2015-12-04 NOTE — Telephone Encounter (Signed)
  The patient reports she has been experiencing elevated BP and HR with exercise. Her HR becomes elevated into the 140's - 150's with exercise and SBP in the 170's - 180's.   Her HR responds appropriately at the discontinuation of exercise and decreases back to the 70's - 80's. She checks her BP at homes in the mornings and gets SBP readings in the low-100's - 140's. Her BP also decreases into this range with following exercise.  She has been taking her Cardizem CD daily, and takes short-acting Cardizem 30mg  and Propranolol 20mg  thirty minutes prior to exercise.   I informed her it is natural for her HR and BP to increase with exercise as long as they return to normal with rest. I mentioned we could potentially increase her Cardizem, but she does not want any medication changes at this time until "she figures out what is causing her headaches".   She reports having headaches which are "different than her normal migraines". She was seen by a Neurologist in the past for migraines and has also been seen by ENT for "sinus headaches". She notes confusion and "short-term memory loss" with the headaches. Reports her mother had TIA's and that is what she feels like she is having. She was seen by her PCP this morning and a Head CT was ordered. I instructed her to follow-up with her PCP following the scan and that it would be beneficial for her to see her Neurologist due to noted confusion with the headaches.  On a different note, she wanted to make Dr. Rockey Situ aware that her recent chest CT did not show an aneurysm according to the read. She has a history of a 4cm aneurysm noted on previous studies.  Signed, Erma Heritage, PA-C 12/04/2015, 4:48 PM Pager: [phone]

## 2015-12-05 ENCOUNTER — Other Ambulatory Visit: Payer: Self-pay | Admitting: Family Medicine

## 2015-12-05 DIAGNOSIS — R51 Headache: Principal | ICD-10-CM

## 2015-12-05 DIAGNOSIS — R519 Headache, unspecified: Secondary | ICD-10-CM

## 2015-12-31 ENCOUNTER — Ambulatory Visit
Admission: RE | Admit: 2015-12-31 | Discharge: 2015-12-31 | Disposition: A | Discharge status: Home / Self Care | Payer: Medicare Other | Source: Ambulatory Visit | Attending: Family Medicine | Admitting: Family Medicine

## 2015-12-31 DIAGNOSIS — G319 Degenerative disease of nervous system, unspecified: Secondary | ICD-10-CM | POA: Diagnosis not present

## 2015-12-31 DIAGNOSIS — R519 Headache, unspecified: Secondary | ICD-10-CM

## 2015-12-31 DIAGNOSIS — Z8673 Personal history of transient ischemic attack (TIA), and cerebral infarction without residual deficits: Secondary | ICD-10-CM | POA: Insufficient documentation

## 2015-12-31 DIAGNOSIS — R51 Headache: Secondary | ICD-10-CM | POA: Insufficient documentation

## 2016-01-01 ENCOUNTER — Ambulatory Visit: Payer: Medicare Other | Admitting: Adult Health

## 2016-01-04 ENCOUNTER — Other Ambulatory Visit (HOSPITAL_COMMUNITY): Payer: Self-pay | Admitting: Cardiology

## 2016-02-06 ENCOUNTER — Other Ambulatory Visit: Payer: Self-pay | Admitting: Cardiovascular Disease

## 2016-03-12 ENCOUNTER — Encounter: Payer: Self-pay | Admitting: Cardiovascular Disease

## 2016-03-19 ENCOUNTER — Encounter: Payer: Self-pay | Admitting: Cardiovascular Disease

## 2016-03-19 ENCOUNTER — Ambulatory Visit (INDEPENDENT_AMBULATORY_CARE_PROVIDER_SITE_OTHER): Payer: Medicare Other | Admitting: Cardiovascular Disease

## 2016-03-19 VITALS — BP 100/78 | HR 76 | Ht 68.0 in | Wt 233.5 lb

## 2016-03-19 DIAGNOSIS — I7121 Aneurysm of the ascending aorta, without rupture: Secondary | ICD-10-CM

## 2016-03-19 DIAGNOSIS — E669 Obesity, unspecified: Secondary | ICD-10-CM

## 2016-03-19 DIAGNOSIS — I2510 Atherosclerotic heart disease of native coronary artery without angina pectoris: Secondary | ICD-10-CM

## 2016-03-19 DIAGNOSIS — R053 Chronic cough: Secondary | ICD-10-CM

## 2016-03-19 DIAGNOSIS — I2584 Coronary atherosclerosis due to calcified coronary lesion: Secondary | ICD-10-CM | POA: Diagnosis not present

## 2016-03-19 DIAGNOSIS — R05 Cough: Secondary | ICD-10-CM | POA: Diagnosis not present

## 2016-03-19 DIAGNOSIS — I712 Thoracic aortic aneurysm, without rupture: Secondary | ICD-10-CM

## 2016-03-19 DIAGNOSIS — M7989 Other specified soft tissue disorders: Secondary | ICD-10-CM | POA: Insufficient documentation

## 2016-03-19 DIAGNOSIS — I10 Essential (primary) hypertension: Secondary | ICD-10-CM | POA: Insufficient documentation

## 2016-03-19 MED ORDER — FUROSEMIDE 20 MG PO TABS: 20.0000 mg | ORAL_TABLET | Freq: Two times a day (BID) | ORAL | Status: DC | PRN

## 2016-03-19 NOTE — Progress Notes (Signed)
Patient ID: Caitlin Leonard, female   DOB: 1945-10-15, 70 y.o.   MRN: [id number] Cardiology Office Note  Date:  03/19/2016   ID:  Caitlin Leonard, DOB 1946/05/07, MRN [id number]  PCP:  Caitlin Pink, MD   Chief Complaint  Patient presents with  . other    6 month f/u c/o flunctuating BP. Meds reviewed verbally.    HPI:  Caitlin Leonard is a 70 year old woman with history of obesity, chronic cough, chronic exertional dyspnea, who presents for routine follow-up of her cough and shortness of breath She has been seen by pulmonary for her exertional shortness of breath and chronic cough. She has 4 cm ascending aorta on CT scan, three-vessel CAD Most recent CT scan February 2017  in follow-up today, she reports that she continues to have her bronchospastic cough. Albuterol seems to make it better.   She is exercising at the [employer] 45 minutes 3 days per week Breathing seems somewhat better Has worsening leg swelling, has been taking Lasix 20 mg twice a day Review of CT scan with her in detail showing ascending aorta aneurysm Mild coronary artery disease This was seen previously on CT scan May 2016  She is followed by pulmonary EKG on today's visit shows normal sinus rhythm with rate 76 bpm, nonspecific ST and T wave abnormality   last lipid panel was 2015 hemoglobin A1c has improved from 6.7 down to 6.3 with her weight loss and exercise    a past medical history  She did complete a exercise VO2 max study with suggestion she had a circulatory issue. She has completed pulmonary rehabilitation, now in [employer]  Lost both of her parents this year 2016, significant stress  Previously seen by Caitlin Leonard as well as cardiology through [facility] for her symptoms. Symptoms of shortness of breath for more than 6 months, very limiting. No regular exercise as she feels unable to ambulate. Also reports having tachycardia with minimal exertion. Periods of near syncope at times, occasional  chest pain  PFTs July 2015 which were normal, chest CT scan May 2015, also again not showing interstitial lung disease Echocardiogram July 2015 showing mildly elevated right heart pressures, right ventricular systolic pressure of 40 Right and left heart catheterizations November 2015 showing no significant CAD, filling pressures were not elevated Possible sleep apnea Holter monitor has been performed showing APCs and PVCs She has been tried on Lasix daily but felt dehydrated Continues on Advair  Includes carotid ultrasounds from August 2010 showing minimal carotid plaquing  Staged symptom limited exercise treadmill testing implies that she has mild to moderate functional limitation, diastolic dysfunction suggested    PMH:   has a past medical history of Hypertension; Heart murmur; Dysrhythmia; Asthma; Sleep apnea (2007); GERD (gastroesophageal reflux disease); History of IBS; Collagenous colitis (2010); Headache(784.0); Arthritis; Anxiety; Depression; Hyperlipemia; Syncopal episodes; Cataract; and Shortness of breath.  PSH:    Past Surgical History  Procedure Laterality Date  . Cardiac catheterization  2012    [facility]  . Lumbar laminectomy  [id number]  . Back surgery    . Tonsillectomy    . Knee arthroscopy  1985, 1990, 2012 x 2    bilateral  . Cholecystectomy  2011  . Nasal sinus surgery  1994  . Abdominal hysterectomy  1979  . Total knee arthroplasty  12/22/2011    Procedure: TOTAL KNEE ARTHROPLASTY;  Surgeon: Rudean Haskell, MD;  Location: [facility];  Service: Orthopedics;  Laterality: Left;    Current Outpatient Prescriptions  Medication Sig Dispense Refill  . albuterol (PROVENTIL HFA;VENTOLIN HFA) 108 (90 BASE) MCG/ACT inhaler Inhale 1-2 puffs into the lungs every 6 (six) hours as needed for wheezing or shortness of breath. 1 Inhaler 3  . ALPRAZolam (XANAX) 0.5 MG tablet Take 1 tablet by mouth at bedtime.    . Azelastine HCl 0.15 % SOLN Place 1 spray into the nose 2 (two) times  daily as needed. 30 mL 1  . CARTIA XT 180 MG 24 hr capsule TAKE 1 CAPSULE(180 MG) BY MOUTH DAILY 90 capsule 3  . diltiazem (CARDIZEM) 30 MG tablet TAKE 1 TABLET(30 MG) BY MOUTH THREE TIMES DAILY AS NEEDED (Patient taking differently: TAKE 1 TABLET(30 MG) BY MOUTH DAILY) 90 tablet 3  . EPIPEN 2-PAK 0.3 MG/0.3ML SOAJ injection as needed.    . fluticasone-salmeterol (ADVAIR HFA) 115-21 MCG/ACT inhaler Inhale 2 puffs into the lungs 2 (two) times daily.    . furosemide (LASIX) 20 MG tablet Take 1 tablet (20 mg total) by mouth daily as needed. 90 tablet 3  . gabapentin (NEURONTIN) 300 MG capsule 300mg  once daily x 5 days, then 300mg  twice daily x 5 days, then 300mg  three times daily to continue 90 capsule 5  . GARCINIA CAMBOGIA-CHROMIUM PO Take 1 capsule by mouth daily.    Marland Kitchen HYDROcodone-acetaminophen (NORCO) 10-325 MG tablet Take 1 tablet by mouth every 6 (six) hours as needed.    . Melatonin 10 MG TABS Take 10 mg by mouth daily.    . montelukast (SINGULAIR) 10 MG tablet Take 1 tablet by mouth daily.    . propranolol (INDERAL) 20 MG tablet TAKE 1 TABLET(20 MG) BY MOUTH THREE TIMES DAILY AS NEEDED (Patient taking differently: TAKE 1 TABLET(20 MG) BY MOUTH DAILY) 90 tablet 3  . sertraline (ZOLOFT) 100 MG tablet Take 100 mg by mouth 2 (two) times daily.     . simvastatin (ZOCOR) 20 MG tablet TAKE 1 TABLET BY MOUTH EVERY EVENING 90 tablet 3   No current facility-administered medications for this visit.     Allergies:   Contrast media; Banana; Hydroxychloroquine sulfate; Oysters; Sulfonamide derivatives; and Metronidazole   Social History:  The patient  reports that she has never smoked. She has never used smokeless tobacco. She reports that she drinks about 0.6 oz of alcohol per week. She reports that she does not use illicit drugs.   Family History:   family history includes Arrhythmia in her father; Diabetes in her mother, paternal grandfather, and paternal uncle; Hyperlipidemia in her father and  mother; Hypertension in her father and mother. There is no history of Anesthesia problems.    Review of Systems: Review of Systems  Constitutional: Negative.   Respiratory: Positive for shortness of breath.   Cardiovascular: Negative.   Gastrointestinal: Negative.   Musculoskeletal: Positive for neck pain.  Neurological: Negative.   Psychiatric/Behavioral: Negative.   All other systems reviewed and are negative.    PHYSICAL EXAM: VS:  BP 100/78 mmHg  Pulse 76  Ht 5\' 8"  (1.727 m)  Wt 233 lb 8 oz (105.915 kg)  BMI 35.51 kg/m2 , BMI Body mass index is 35.51 kg/(m^2). GEN: Well nourished, well developed, in no acute distress HEENT: normal Neck: no JVD, carotid bruits, or masses Cardiac: RRR; no murmurs, rubs, or gallops,no edema  Respiratory:  clear to auscultation bilaterally, normal work of breathing GI: soft, nontender, nondistended, + BS MS: no deformity or atrophy Skin: warm and dry, no rash Neuro:  Strength and sensation are intact Psych: euthymic mood,  full affect    Recent Labs: No results found for requested labs within last 365 days.    Lipid Panel No results found for: CHOL, HDL, LDLCALC, TRIG    Wt Readings from Last 3 Encounters:  03/19/16 233 lb 8 oz (105.915 kg)  11/21/15 233 lb 3.2 oz (105.779 kg)  11/14/15 230 lb 6.4 oz (104.509 kg)       ASSESSMENT AND PLAN:  Coronary artery calcification - CT scan reviewed with her in detail, images pulled up in the office We have shown her the aorta dilation, coronary calcifications She is a nonsmoker, diabetes numbers improving, Most recent lipid panel 2015 done through primary care, she is on a statin  Aneurysm, ascending aorta (HCC) Recent CT scan again documenting 4 cm aorta  Chronic cough Symptoms stable, followed by pulmonary  Obesity  exercising 3 days per week with [employer]   improving diabetes, congratulated her on her progress  Hypertension She is concerned about labile  hypertension Given blood pressure is very low on today's visit [id number] systolic, no medication changes made Low blood pressure could be secondary to overuse of diuretics. Cautioned her on the overuse of diuretics  Lower extremity edema Likely from the heat, venous insufficiency, less likely CHF  Warned her on overuse of Lasix  She does not want potassium pill  Disposition:   F/U  6 months    Total encounter time more than 25 minutes  Greater than 50% was spent in counseling and coordination of care with the patient    Orders Placed This Encounter  Procedures  . EKG 12-Lead     Signed, Esmond Plants, M.D., Ph.D. 03/19/2016  [place], [place]

## 2016-03-19 NOTE — Patient Instructions (Signed)
You are doing well. No medication changes were made.  Please call us if you have new issues that need to be addressed before your next appt.  Your physician wants you to follow-up in: 12 months.  You will receive a reminder letter in the mail two months in advance. If you don't receive a letter, please call our office to schedule the follow-up appointment. 

## 2016-03-21 DIAGNOSIS — M7582 Other shoulder lesions, left shoulder: Secondary | ICD-10-CM | POA: Insufficient documentation

## 2016-03-21 DIAGNOSIS — M47812 Spondylosis without myelopathy or radiculopathy, cervical region: Secondary | ICD-10-CM | POA: Insufficient documentation

## 2016-04-21 ENCOUNTER — Ambulatory Visit: Payer: Medicare Other | Attending: Surgery | Admitting: Physical Therapy

## 2016-04-21 DIAGNOSIS — R293 Abnormal posture: Secondary | ICD-10-CM | POA: Diagnosis present

## 2016-04-21 DIAGNOSIS — M25511 Pain in right shoulder: Secondary | ICD-10-CM | POA: Insufficient documentation

## 2016-04-21 DIAGNOSIS — M25512 Pain in left shoulder: Secondary | ICD-10-CM | POA: Diagnosis present

## 2016-04-21 NOTE — Therapy (Signed)
[place] MAIN [facility] SERVICES [place], [place], [address] Phone: [phone]   Fax:  [phone]  Physical Therapy Evaluation  Patient Details  Name: Caitlin Leonard MRN: [id number] Date of Birth: [birth year] Referring Provider: Dr. Roland Rack  Encounter Date: 04/21/2016      PT End of Session - 04/21/16 1638    Visit Number 1   Number of Visits 17   Date for PT Re-Evaluation 05/19/16   Authorization Type 1/10   PT Start Time 1518   PT Stop Time 1615   PT Time Calculation (min) 57 min   Activity Tolerance Patient tolerated treatment well   Behavior During Therapy [facility] for tasks assessed/performed      Past Medical History  Diagnosis Date  . Hypertension     on medication x 10 years  . Heart murmur     MVP ( symptomatic)  . Dysrhythmia   . Asthma     due to seasonal alllergies  . Sleep apnea 2007    does not use CPAP since 40 # wt loss  . GERD (gastroesophageal reflux disease)   . History of IBS   . Collagenous colitis 2010  . Headache(784.0)     migraines; 2 x month  . Arthritis     osteoarthritis  . Anxiety   . Depression     situational  . Hyperlipemia   . Syncopal episodes   . Cataract   . Shortness of breath     exertional    Past Surgical History  Procedure Laterality Date  . Cardiac catheterization  2012    [facility]  . Lumbar laminectomy  [id number]  . Back surgery    . Tonsillectomy    . Knee arthroscopy  1985, 1990, 2012 x 2    bilateral  . Cholecystectomy  2011  . Nasal sinus surgery  1994  . Abdominal hysterectomy  1979  . Total knee arthroplasty  12/22/2011    Procedure: TOTAL KNEE ARTHROPLASTY;  Surgeon: Rudean Haskell, MD;  Location: [facility];  Service: Orthopedics;  Laterality: Left;    There were no vitals filed for this visit.       Subjective Assessment - 04/21/16 1633    Subjective Pt c/o bil shoulder pain which affects her neck, L>R. She states she has had bursitis in her L shoulder  since she was 70 YO. She reports that the pain in shoulders has gotten worse over the last two months and has subsequently made her neck worse. She states that both shoulders hurt simultaneously and occurs during any activity. She states rest and ice make it feel better. She participates in [employer] and she is worried she may make her shoulder pain worse by performing some of the exercises. She states that the neck pain always comes on after shoulder pain. She denies n/t or numbness, it is just a deep pain in the L shoulder. She states that her sleep has not been disturbed but she cannot perform household chores due to shoulder pain. She states that she cannot lift a gallon of milk out of the fridge. She pinpoints her L shoulder pain as only on the posterior side and that the R shoulder just feels stiff.   Limitations Lifting;House hold activities   Patient Stated Goals to not hurt anymore, be able to travel without shoulder pain   Currently in Pain? Yes   Pain Score 4    Pain Location Shoulder   Pain Orientation Right;Left  Pain Descriptors / Indicators Sore;Aching   Pain Type Chronic pain   Pain Onset More than a month ago   Aggravating Factors  activity   Pain Relieving Factors rest, ice   Effect of Pain on Daily Activities unable to do household chores            [facility] PT Assessment - 04/21/16 0001    Assessment   Medical Diagnosis Neck pain, L shoulder pain, R shoulder RC tendinitis, spondylosis   Referring Provider Dr. Roland Rack   Onset Date/Surgical Date 02/20/16   Prior Therapy not for current issue   Balance Screen   Has the patient fallen in the past 6 months No   Has the patient had a decrease in activity level because of a fear of falling?  Yes   Is the patient reluctant to leave their home because of a fear of falling?  No   Home Ecologist residence   Living Arrangements Spouse/significant other   Available Help at Discharge Family   Type  of [place] to enter   Entrance Stairs-Number of Steps 3   Entrance Stairs-Rails [place] One level   Home Equipment Toilet riser   Prior Function   Level of [place] Needs assistance with homemaking;Needs assistance with ADLs   Vocation Retired   Leisure travel      Freedom not tested  [place]  AROM WNL BUE  Cervical:   Left Right  Flexion 22 deg   Extension 20 deg, painful   Lateral flexion 21 deg, painful 18 deg  Rotation 21 30           MMT  Left Right  Shoulder flexion 4- 4+  Shoulder extension    Shoulder abduction 4- 4+  Shoulder IR/ER 4+ / 4- 4+ / 4-  Elbow flexion 4+ 4+  Elbow extension 4- 4+  Wrist flexion 5 5  Wrist extension 5 5  Supination 5 5  Pronation  5 5  Lower trap 2+ 2+  Rhomboid 4+ 4+   FUNCTIONAL MOVEMENT Bil UT compensation with UE elevation in flexion and abduction  SPECIAL TESTS Drop arm: -BUE Neer's: +LUE, decreased pain with scap stabilization Hawkin's kennedy: -BUE Obrien's test: -R, decreased pain without pronation on LUE Spurlings: - Distraction: -   POSTURE/OBSERVATION/PALPATION/CERVICAL MOBILITY Increased thoracic kyphosis, increased forward head posture, rounded shoulders Increased soft tissue restrictions and painful to palpation of bil cervical and thoracic erector spinae, supraspinatus, infraspinatus, UT, deep cervical flexors, SCM, pec minor L>R throughout Increased cervical and thoracic hypomobility and pain with grade 1 CPAs  Manual Treatment: Grade 1 CPAs, T2-T6 and C2-5, 1x30 sec bouts each; pt reported decreased pain following each segment bout Manual cervical traction x 5 min; pt reported decreased neck pain and decreased tenderness to palpation of deep cervical flexors at end of treatment Therex: HEP-- seated thoracic extension over foam roll 3x10, seated thoracic rotation 3x10 sec hold, bil pec stretch in doorway, 1x30 sec        PT Education -  04/21/16 1638    Education provided Yes   Education Details exam findings, POC   Person(s) Educated Patient   Methods Explanation   Comprehension Verbalized understanding             PT Long Term Goals - 04/21/16 1645    PT LONG TERM GOAL #1   Title pt will be independent with HEP to maximize function and  decrease risk of reinjury.   Time 8   Period Weeks   Status New   PT LONG TERM GOAL #2   Title pt will have improved Quick Dash score by >8 points to demonstrate improved overall function and decreased pain.   Baseline 41 (04/21/16)   Time 8   Period Weeks   Status New   PT LONG TERM GOAL #3   Title pt will be able to travel for >2 hours with <3/10 pain to demonstrate improved overall function and decreased pain.   Time 8   Period Weeks   Status New               Plan - 04/21/16 1638    Clinical Impression Statement pt is 70 YO F who presents to therapy today with c/o bil shoulder pain, L>R, and neck pain due to shoulder pain. pt has significant impairments in posture as demonstrated by increased thoracic kyphosis, increased cervical lordosis/forward head, and deficits in UE shoulder and scapular strength. pt also presents with hypomobility and tenderness throughout cervical and thoracic mobility with CPAs. She also presents with increased soft tissue restrictions and tenderness to palpation of thoracic erector spinae, scapular and shoulder musculature. pt needs skilled PT intervention to address deficits in order to maximize overall function and decrease pain.   Rehab Potential Fair   Clinical Impairments Affecting Rehab Potential chronicity of current issue, comorbidities   PT Frequency 2x / week   PT Duration 8 weeks   PT Treatment/Interventions ADLs/Self Care Home Management;Aquatic Therapy;Cryotherapy;Electrical Stimulation;Traction;Functional mobility training;Therapeutic activities;Therapeutic exercise;Neuromuscular re-education;Patient/family education;Manual  techniques;Passive range of motion;Dry needling;Taping   PT Next Visit Plan HEP, manual, strengthening   Consulted and Agree with Plan of Care Patient      Patient will benefit from skilled therapeutic intervention in order to improve the following deficits and impairments:  Decreased activity tolerance, Decreased endurance, Decreased range of motion, Decreased strength, Hypomobility, Increased muscle spasms, Impaired UE functional use, Improper body mechanics, Postural dysfunction, Pain  Visit Diagnosis: Pain in left shoulder - Plan: PT plan of care cert/re-cert  Pain in right shoulder - Plan: PT plan of care cert/re-cert  Abnormal posture - Plan: PT plan of care cert/re-cert      G-Codes - [id number]    Functional Assessment Tool Used Quick DASH, pain, cervical ROM   Functional Limitation Carrying, moving and handling objects   Carrying, Moving and Handling Objects Current Status [id number]) At least 20 percent but less than 40 percent impaired, limited or restricted   Carrying, Moving and Handling Objects Goal Status [id number]) At least 1 percent but less than 20 percent impaired, limited or restricted       Problem List Patient Active Problem List   Diagnosis Date Noted  . Leg swelling 03/19/2016  . Essential hypertension 03/19/2016  . Obesity 09/10/2015  . Bronchitis, acute 04/11/2015  . OSA (obstructive sleep apnea) 10/18/2014  . Nodule of right lung 06/14/2014  . Dyspnea 06/13/2014  . Coronary artery calcification 04/17/2014  . Aneurysm, ascending aorta ([place]) 04/17/2014  . Chronic cough 02/24/2014  . Mold suspected exposure 02/24/2014   [name], SPT  This entire session was performed under direct supervision and direction of a licensed therapist/therapist assistant . I have personally read, edited and approve of the note as written.  Kosse, PT, DPT  04/21/2016, 6:17 PM [place] MAIN [facility]  SERVICES [address] [place], [place], [address] Phone: [phone]  Fax:  [phone]  Name: Caitlin Leonard MRN: [id number] Date of Birth: 1945/11/02

## 2016-04-21 NOTE — Patient Instructions (Signed)
[email or web address]  Seated thoracic extension over towel roll, x10 at multiple segments, 2x/day Seated thoracic rotation x 10 each direction, 2x/day bil pec stretch in doorway, 3x30 sec, 2x/day

## 2016-04-23 ENCOUNTER — Ambulatory Visit: Payer: Medicare Other | Admitting: Physical Therapy

## 2016-04-23 DIAGNOSIS — M25512 Pain in left shoulder: Secondary | ICD-10-CM | POA: Diagnosis not present

## 2016-04-23 DIAGNOSIS — M25511 Pain in right shoulder: Secondary | ICD-10-CM

## 2016-04-23 DIAGNOSIS — R293 Abnormal posture: Secondary | ICD-10-CM

## 2016-04-23 NOTE — Therapy (Signed)
[place] MAIN [facility] SERVICES [place], [place], [address] Phone: [phone]   Fax:  [phone]  Physical Therapy Treatment  Patient Details  Name: Caitlin Leonard MRN: [id number] Date of Birth: July 12, 1946 Referring Provider: Dr. Roland Rack  Encounter Date: 04/23/2016      PT End of Session - 04/23/16 1714    Visit Number 2   Number of Visits 17   Date for PT Re-Evaluation 05/19/16   Authorization Type 2/10   PT Start Time 1518   PT Stop Time 1600   PT Time Calculation (min) 42 min   Activity Tolerance Patient tolerated treatment well   Behavior During Therapy [facility] for tasks assessed/performed      Past Medical History  Diagnosis Date  . Hypertension     on medication x 10 years  . Heart murmur     MVP ( symptomatic)  . Dysrhythmia   . Asthma     due to seasonal alllergies  . Sleep apnea 2007    does not use CPAP since 40 # wt loss  . GERD (gastroesophageal reflux disease)   . History of IBS   . Collagenous colitis 2010  . Headache(784.0)     migraines; 2 x month  . Arthritis     osteoarthritis  . Anxiety   . Depression     situational  . Hyperlipemia   . Syncopal episodes   . Cataract   . Shortness of breath     exertional    Past Surgical History  Procedure Laterality Date  . Cardiac catheterization  2012    [facility]  . Lumbar laminectomy  [id number]  . Back surgery    . Tonsillectomy    . Knee arthroscopy  1985, 1990, 2012 x 2    bilateral  . Cholecystectomy  2011  . Nasal sinus surgery  1994  . Abdominal hysterectomy  1979  . Total knee arthroplasty  12/22/2011    Procedure: TOTAL KNEE ARTHROPLASTY;  Surgeon: Rudean Haskell, MD;  Location: [place];  Service: Orthopedics;  Laterality: Left;    There were no vitals filed for this visit.      Subjective Assessment - 04/23/16 1710    Subjective pt reports increased pain in shoulders (R>L) as she participated in [employer] class prior to therapy and  performed a lot of UE strengthening exercises.   Limitations Lifting;House hold activities   Patient Stated Goals to not hurt anymore, be able to travel without shoulder pain   Currently in Pain? Yes   Pain Score 7    Pain Location Shoulder   Pain Orientation Right;Left   Pain Descriptors / Indicators Sore   Pain Onset More than a month ago       Manual: Grade 1-2 CPAs to T1-6, C3-7, 2x30 sec bouts each Trigger point ischemic release of middle trap, thoracic and cervical erector spinae bilaterally; L infraspinatus  Pt reported decreased symptoms following manual treatment.  Therex: Standing bil Y's with lift off, 2x10; min-mod verbal cues for proper technique and for proper posture throughout Bil low rows with RTB and scap retraction with RTB, 2x10 each; min verbal cues for proper posture and technique        PT Education - 04/23/16 1711    Education provided Yes   Education Details exercise technique, posture   Person(s) Educated Patient   Methods Explanation   Comprehension Verbalized understanding  PT Long Term Goals - 04/21/16 1645    PT LONG TERM GOAL #1   Title pt will be independent with HEP to maximize function and decrease risk of reinjury.   Time 8   Period Weeks   Status New   PT LONG TERM GOAL #2   Title pt will have improved Quick Dash score by >8 points to demonstrate improved overall function and decreased pain.   Baseline 41 (04/21/16)   Time 8   Period Weeks   Status New   PT LONG TERM GOAL #3   Title pt will be able to travel for >2 hours with <3/10 pain to demonstrate improved overall function and decreased pain.   Time 8   Period Weeks   Status New               Plan - 04/23/16 1714    Clinical Impression Statement pt reports to therapy today with increased pain in BLE (R>L) following a workout with [employer] prior to therapy. Pt educated that her kyphotic and forward head posture are affecting ability to perform OH  activities and leading to her pain. Pt had multiple trigger points in bil middle trap and upper thoracic/lower cervical erector spinae. Pt had referred pain into R neck with R middle trap tirgger point ischemic releases; pt reported decreased referred pain after a few minutes of manual therapy to the area. Pt upper thoracic and lower cervical spine continue to be hypomobile and painful with CPAs. Pt educated on importance of throacic extensions and cervical retractions to improve posture and decrease shoulder pain and she verbalized understanding. Pt needs continued skilled PT to maximize function and decrease pain.   Rehab Potential Fair   Clinical Impairments Affecting Rehab Potential chronicity of current issue, comorbidities   PT Frequency 2x / week   PT Duration 8 weeks   PT Treatment/Interventions ADLs/Self Care Home Management;Aquatic Therapy;Cryotherapy;Electrical Stimulation;Traction;Functional mobility training;Therapeutic activities;Therapeutic exercise;Neuromuscular re-education;Patient/family education;Manual techniques;Passive range of motion;Dry needling;Taping   PT Next Visit Plan HEP, manual, strengthening   Consulted and Agree with Plan of Care Patient      Patient will benefit from skilled therapeutic intervention in order to improve the following deficits and impairments:  Decreased activity tolerance, Decreased endurance, Decreased range of motion, Decreased strength, Hypomobility, Increased muscle spasms, Impaired UE functional use, Improper body mechanics, Postural dysfunction, Pain  Visit Diagnosis: Pain in left shoulder  Pain in right shoulder  Abnormal posture     Problem List Patient Active Problem List   Diagnosis Date Noted  . Leg swelling 03/19/2016  . Essential hypertension 03/19/2016  . Obesity 09/10/2015  . Bronchitis, acute 04/11/2015  . OSA (obstructive sleep apnea) 10/18/2014  . Nodule of right lung 06/14/2014  . Dyspnea 06/13/2014  . Coronary  artery calcification 04/17/2014  . Aneurysm, ascending aorta ([place]) 04/17/2014  . Chronic cough 02/24/2014  . Mold suspected exposure 02/24/2014   [name], SPT  [name] 04/23/2016, 5:24 PM   This entire session was performed under direct supervision and direction of a licensed therapist/therapist assistant . I have personally read, edited and approve of the note as written.  Mindy Shiela Mayer, PT, DPT  04/23/2016, 6:13 PM [place] MAIN [facility] SERVICES [address] [place], [place], [address] Phone: [phone]   Fax:  [phone]  Name: Caitlin Leonard MRN: [id number] Date of Birth: 02-16-1946

## 2016-04-28 ENCOUNTER — Ambulatory Visit: Payer: Medicare Other

## 2016-04-28 DIAGNOSIS — M25512 Pain in left shoulder: Secondary | ICD-10-CM

## 2016-04-28 DIAGNOSIS — M25511 Pain in right shoulder: Secondary | ICD-10-CM

## 2016-04-28 DIAGNOSIS — R293 Abnormal posture: Secondary | ICD-10-CM

## 2016-04-28 NOTE — Therapy (Addendum)
[place] MAIN [facility] SERVICES [address] [place], [place], [address] Phone: [phone]   Fax:  [phone]  Physical Therapy Treatment  Patient Details  Name: Caitlin Leonard MRN: [id number] Date of Birth: Mar 01, 1946 Referring Provider: Dr. Roland Rack  Encounter Date: 04/28/2016    Past Medical History:  Diagnosis Date  . Anxiety   . Arthritis    osteoarthritis  . Asthma    due to seasonal alllergies  . Cataract   . Collagenous colitis 2010  . Depression    situational  . Dysrhythmia   . GERD (gastroesophageal reflux disease)   . Headache(784.0)    migraines; 2 x month  . Heart murmur    MVP ( symptomatic)  . History of IBS   . Hyperlipemia   . Hypertension    on medication x 10 years  . Shortness of breath    exertional  . Sleep apnea 2007   does not use CPAP since 40 # wt loss  . Syncopal episodes     Past Surgical History:  Procedure Laterality Date  . ABDOMINAL HYSTERECTOMY  1979  . BACK SURGERY    . CARDIAC CATHETERIZATION  2012   [facility]  . CHOLECYSTECTOMY  2011  . KNEE ARTHROSCOPY  1985, 1990, 2012 x 2   bilateral  . LUMBAR LAMINECTOMY  [id number]  . NASAL SINUS SURGERY  1994  . TONSILLECTOMY    . TOTAL KNEE ARTHROPLASTY  12/22/2011   Procedure: TOTAL KNEE ARTHROPLASTY;  Surgeon: Rudean Haskell, MD;  Location: [place];  Service: Orthopedics;  Laterality: Left;    There were no vitals filed for this visit.     Manual: Grade 1-2 CPAs T1-6, 1x45-60 sec bouts; pt most tender at T1 and T4; symptoms decreased at end of bout Trigger point ischemic release to bil upper thoracic erector spinae; pt had radicular pain down RUE with manual techniques around T4 and these symptoms decreased after about 30 sec-1 min; pt more tender L side Cross friction and trigger point ischemic release to L SCM; pt most tender at proximal insertion Manual L SCM stretch 2x30 sec             PT Long Term Goals - 04/21/16 1645       PT LONG TERM GOAL #1   Title pt will be independent with HEP to maximize function and decrease risk of reinjury.   Time 8   Period Weeks   Status New     PT LONG TERM GOAL #2   Title pt will have improved Quick Dash score by >8 points to demonstrate improved overall function and decreased pain.   Baseline 41 (04/21/16)   Time 8   Period Weeks   Status New     PT LONG TERM GOAL #3   Title pt will be able to travel for >2 hours with <3/10 pain to demonstrate improved overall function and decreased pain.   Time 8   Period Weeks   Status New             Patient will benefit from skilled therapeutic intervention in order to improve the following deficits and impairments:  Decreased activity tolerance, Decreased endurance, Decreased range of motion, Decreased strength, Hypomobility, Increased muscle spasms, Impaired UE functional use, Improper body mechanics, Postural dysfunction, Pain  Visit Diagnosis: Pain in left shoulder  Pain in right shoulder  Abnormal posture     Problem List Patient Active Problem List   Diagnosis Date Noted  .  Leg swelling 03/19/2016  . Essential hypertension 03/19/2016  . Obesity 09/10/2015  . Bronchitis, acute 04/11/2015  . OSA (obstructive sleep apnea) 10/18/2014  . Nodule of right lung 06/14/2014  . Dyspnea 06/13/2014  . Coronary artery calcification 04/17/2014  . Aneurysm, ascending aorta ([place]) 04/17/2014  . Chronic cough 02/24/2014  . Mold suspected exposure 02/24/2014   Caitlin Leonard, SPT This entire session was performed under direct supervision and direction of a licensed therapist/therapist assistant . I have personally read, edited and approve of the note as written.  Brice,Carrie PT, DPT 04/30/2016, 8:55 AM  [place] MAIN [facility] SERVICES [address] [place], [place], [address] Phone: [phone]   Fax:  [phone]  Name: Caitlin Leonard MRN: [id number] Date of Birth:  1945/10/26

## 2016-04-30 ENCOUNTER — Encounter: Payer: Self-pay | Admitting: Cardiovascular Disease

## 2016-04-30 ENCOUNTER — Ambulatory Visit: Payer: Medicare Other

## 2016-04-30 DIAGNOSIS — M25512 Pain in left shoulder: Secondary | ICD-10-CM | POA: Diagnosis not present

## 2016-04-30 DIAGNOSIS — R293 Abnormal posture: Secondary | ICD-10-CM

## 2016-04-30 DIAGNOSIS — M25511 Pain in right shoulder: Secondary | ICD-10-CM

## 2016-04-30 NOTE — Therapy (Signed)
[place] MAIN [facility] SERVICES [place], [place], [address] Phone: [phone]   Fax:  [phone]  Physical Therapy Treatment  Patient Details  Name: Caitlin Leonard MRN: [id number] Date of Birth: 1945/11/07 Referring Provider: Dr. Roland Rack  Encounter Date: 04/30/2016      PT End of Session - 04/30/16 1710    Visit Number 4   Number of Visits 17   Date for PT Re-Evaluation 05/19/16   Authorization Type 4/10   PT Start Time 1520   PT Stop Time 1603   PT Time Calculation (min) 43 min   Activity Tolerance Patient tolerated treatment well   Behavior During Therapy [facility] for tasks assessed/performed      Past Medical History:  Diagnosis Date  . Anxiety   . Arthritis    osteoarthritis  . Asthma    due to seasonal alllergies  . Cataract   . Collagenous colitis 2010  . Depression    situational  . Dysrhythmia   . GERD (gastroesophageal reflux disease)   . Headache(784.0)    migraines; 2 x month  . Heart murmur    MVP ( symptomatic)  . History of IBS   . Hyperlipemia   . Hypertension    on medication x 10 years  . Shortness of breath    exertional  . Sleep apnea 2007   does not use CPAP since 40 # wt loss  . Syncopal episodes     Past Surgical History:  Procedure Laterality Date  . ABDOMINAL HYSTERECTOMY  1979  . BACK SURGERY    . CARDIAC CATHETERIZATION  2012   [facility]  . CHOLECYSTECTOMY  2011  . KNEE ARTHROSCOPY  1985, 1990, 2012 x 2   bilateral  . LUMBAR LAMINECTOMY  [id number]  . NASAL SINUS SURGERY  1994  . TONSILLECTOMY    . TOTAL KNEE ARTHROPLASTY  12/22/2011   Procedure: TOTAL KNEE ARTHROPLASTY;  Surgeon: Rudean Haskell, MD;  Location: [place];  Service: Orthopedics;  Laterality: Left;    There were no vitals filed for this visit.      Subjective Assessment - 04/30/16 1708    Subjective Pt reports decreased shoulder pain this date because she did not attend [employer] class.   Limitations  Lifting;House hold activities   Patient Stated Goals to not hurt anymore, be able to travel without shoulder pain   Currently in Pain? Yes   Pain Score 3    Pain Location Shoulder   Pain Orientation Left;Right   Pain Onset More than a month ago      Manual: Trigger point ischemic release upper thoracic erector spinae, middle trap, erector spinae at cervical-thoracic junction (most tender at this point), bilaterally, and L infraspinatus; pt more tender L>R throughout; pt c/o increased pain with manual therapy but after extended period of time of ischemic release, pt reported no pain Grade 1 CPAs T2-T6, 1x60 sec bouts each; pt tender initially but pain had decreased at end of bout Manual cervical traction x 6 min Therex: Thoracic extension over foam roll while supine; pt reported increased pain in L shoulder; mod verbal cues to maintain cervical retraction       PT Education - 04/30/16 1709    Education provided Yes   Education Details posture, HEP   Person(s) Educated Patient   Methods Explanation   Comprehension Verbalized understanding             PT Long Term Goals - 04/21/16 1645  PT LONG TERM GOAL #1   Title pt will be independent with HEP to maximize function and decrease risk of reinjury.   Time 8   Period Weeks   Status New     PT LONG TERM GOAL #2   Title pt will have improved Quick Dash score by >8 points to demonstrate improved overall function and decreased pain.   Baseline 41 (04/21/16)   Time 8   Period Weeks   Status New     PT LONG TERM GOAL #3   Title pt will be able to travel for >2 hours with <3/10 pain to demonstrate improved overall function and decreased pain.   Time 8   Period Weeks   Status New               Plan - 04/30/16 1710    Clinical Impression Statement Pt decreased pain this date due to not attending fitness class prior to therapy. She continues with increased sensitivity to CPAs of thoracic spine and manual  techniques to musculature surrounding thoracic spine, cervical spine, and scapulae, L>R throughout. Pt continues with deficits in posture and educated again that this is a very important aspect in her recovery. Pt needs continued skilled PT to maximize overall function and decrease pain.   Rehab Potential Fair   Clinical Impairments Affecting Rehab Potential chronicity of current issue, comorbidities   PT Frequency 2x / week   PT Duration 8 weeks   PT Treatment/Interventions ADLs/Self Care Home Management;Aquatic Therapy;Cryotherapy;Electrical Stimulation;Traction;Functional mobility training;Therapeutic activities;Therapeutic exercise;Neuromuscular re-education;Patient/family education;Manual techniques;Passive range of motion;Dry needling;Taping   PT Next Visit Plan HEP, manual, strengthening   Consulted and Agree with Plan of Care Patient      Patient will benefit from skilled therapeutic intervention in order to improve the following deficits and impairments:  Decreased activity tolerance, Decreased endurance, Decreased range of motion, Decreased strength, Hypomobility, Increased muscle spasms, Impaired UE functional use, Improper body mechanics, Postural dysfunction, Pain  Visit Diagnosis: Pain in left shoulder  Pain in right shoulder  Abnormal posture     Problem List Patient Active Problem List   Diagnosis Date Noted  . Leg swelling 03/19/2016  . Essential hypertension 03/19/2016  . Obesity 09/10/2015  . Bronchitis, acute 04/11/2015  . OSA (obstructive sleep apnea) 10/18/2014  . Nodule of right lung 06/14/2014  . Dyspnea 06/13/2014  . Coronary artery calcification 04/17/2014  . Aneurysm, ascending aorta ([place]) 04/17/2014  . Chronic cough 02/24/2014  . Mold suspected exposure 02/24/2014   [name], SPT  This entire session was performed under direct supervision and direction of a licensed therapist/therapist assistant . I have personally read, edited and approve of  the note as written. Gorden Harms. Tortorici, PT, DPT [phone]   Tortorici,Ashley 05/01/2016, 10:33 AM  [place] MAIN [facility] SERVICES [address] [place], [place], [address] Phone: [phone]   Fax:  [phone]  Name: Caitlin Leonard MRN: [id number] Date of Birth: 06/11/1946

## 2016-05-05 ENCOUNTER — Ambulatory Visit: Payer: Medicare Other

## 2016-05-05 VITALS — BP 129/54

## 2016-05-05 DIAGNOSIS — M25511 Pain in right shoulder: Secondary | ICD-10-CM

## 2016-05-05 DIAGNOSIS — M25512 Pain in left shoulder: Secondary | ICD-10-CM | POA: Diagnosis not present

## 2016-05-05 NOTE — Patient Instructions (Signed)
[email or web address] bil UT and levator scap stretch, 3x30 sec each; 2x/day

## 2016-05-05 NOTE — Therapy (Signed)
[place] MAIN [facility] SERVICES [place], [place], [address] Phone: [phone]   Fax:  [phone]  Physical Therapy Treatment  Patient Details  Name: Caitlin Leonard MRN: [id number] Date of Birth: Dec 14, 1945 Referring Provider: Dr. Roland Rack  Encounter Date: 05/05/2016      PT End of Session - 05/05/16 1543    Visit Number 5   Number of Visits 17   Date for PT Re-Evaluation 05/19/16   Authorization Type 5/10   PT Start Time 1516   PT Stop Time 1558   PT Time Calculation (min) 42 min   Activity Tolerance Patient tolerated treatment well   Behavior During Therapy [facility] for tasks assessed/performed      Past Medical History:  Diagnosis Date  . Anxiety   . Arthritis    osteoarthritis  . Asthma    due to seasonal alllergies  . Cataract   . Collagenous colitis 2010  . Depression    situational  . Dysrhythmia   . GERD (gastroesophageal reflux disease)   . Headache(784.0)    migraines; 2 x month  . Heart murmur    MVP ( symptomatic)  . History of IBS   . Hyperlipemia   . Hypertension    on medication x 10 years  . Shortness of breath    exertional  . Sleep apnea 2007   does not use CPAP since 40 # wt loss  . Syncopal episodes     Past Surgical History:  Procedure Laterality Date  . ABDOMINAL HYSTERECTOMY  1979  . BACK SURGERY    . CARDIAC CATHETERIZATION  2012   [facility]  . CHOLECYSTECTOMY  2011  . KNEE ARTHROSCOPY  1985, 1990, 2012 x 2   bilateral  . LUMBAR LAMINECTOMY  [id number]  . NASAL SINUS SURGERY  1994  . TONSILLECTOMY    . TOTAL KNEE ARTHROPLASTY  12/22/2011   Procedure: TOTAL KNEE ARTHROPLASTY;  Surgeon: Rudean Haskell, MD;  Location: [place];  Service: Orthopedics;  Laterality: Left;    Vitals:   05/05/16 1600  BP: (!) 129/54        Subjective Assessment - 05/05/16 1543    Subjective Pt reports being partially compliant with HEP over the weekend and c/o both shoulders presently being sore following  [employer] class.   Limitations Lifting;House hold activities   Patient Stated Goals to not hurt anymore, be able to travel without shoulder pain   Currently in Pain? Yes   Pain Score 5    Pain Location Shoulder   Pain Orientation Right;Left   Pain Onset More than a month ago      Manual:  Trigger point ischemic release to L upper, middle trap, levator scap x 15 min; pt reported decreased pain to palpation following treatment  Therex: Inverted Y's against wall with 3 sec hold, 3x10, min verbal cues for proper technique and posture Bent over Y's and T's 3x10 each; mod verbal cues for proper technique and posture throughout exercise Wall push-ups plus, x10, x6; pt limited by bil knee pain; min verbal cues for proper technique and posture Bil UT/levator scap stretch, 3x30 sec each; min verbal cues for proper technique; added to HEP         PT Education - 05/05/16 1544    Education provided Yes   Education Details posture, exercise technique   Person(s) Educated Patient   Methods Explanation   Comprehension Verbalized understanding  PT Long Term Goals - 04/21/16 1645      PT LONG TERM GOAL #1   Title pt will be independent with HEP to maximize function and decrease risk of reinjury.   Time 8   Period Weeks   Status New     PT LONG TERM GOAL #2   Title pt will have improved Quick Dash score by >8 points to demonstrate improved overall function and decreased pain.   Baseline 41 (04/21/16)   Time 8   Period Weeks   Status New     PT LONG TERM GOAL #3   Title pt will be able to travel for >2 hours with <3/10 pain to demonstrate improved overall function and decreased pain.   Time 8   Period Weeks   Status New               Plan - 05/05/16 1602    Clinical Impression Statement Pt making slow progress towards goals. She presents with continued soft tissue restrictions and tenderness to palpation of L scapular and cervical musculature. Pt  continues to demonstrate forward head posture and requires cues for correction throughout exercises. Pt L scapula winged with active OH shoulder movement so SPT addressed subscapularis weakness during treatment session. Pt c/o dizziness and lightheadedness towards end of session so SPT checked BP which was 129/54. Pt stated diastolic number was low for her. After few minutes of rest, pt reported feeling better. Pt needs continued skilled PT intervention to maximize overall function and decrease pain.   Rehab Potential Fair   Clinical Impairments Affecting Rehab Potential chronicity of current issue, comorbidities   PT Frequency 2x / week   PT Duration 8 weeks   PT Treatment/Interventions ADLs/Self Care Home Management;Aquatic Therapy;Cryotherapy;Electrical Stimulation;Traction;Functional mobility training;Therapeutic activities;Therapeutic exercise;Neuromuscular re-education;Patient/family education;Manual techniques;Passive range of motion;Dry needling;Taping   PT Next Visit Plan HEP, manual, strengthening   Consulted and Agree with Plan of Care Patient      Patient will benefit from skilled therapeutic intervention in order to improve the following deficits and impairments:  Decreased activity tolerance, Decreased endurance, Decreased range of motion, Decreased strength, Hypomobility, Increased muscle spasms, Impaired UE functional use, Improper body mechanics, Postural dysfunction, Pain  Visit Diagnosis: Pain in left shoulder  Pain in right shoulder     Problem List Patient Active Problem List   Diagnosis Date Noted  . Leg swelling 03/19/2016  . Essential hypertension 03/19/2016  . Obesity 09/10/2015  . Bronchitis, acute 04/11/2015  . OSA (obstructive sleep apnea) 10/18/2014  . Nodule of right lung 06/14/2014  . Dyspnea 06/13/2014  . Coronary artery calcification 04/17/2014  . Aneurysm, ascending aorta ([place]) 04/17/2014  . Chronic cough 02/24/2014  . Mold suspected exposure  02/24/2014   [name], SPT This entire session was performed under direct supervision and direction of a licensed therapist/therapist assistant . I have personally read, edited and approve of the note as written. Gorden Harms. Tortorici, PT, DPT [phone]  Tortorici,Ashley 05/05/2016, 5:33 PM  [place] MAIN [facility] SERVICES [address] [place], [place], [address] Phone: [phone]   Fax:  [phone]  Name: CAMIELLE MILBRATH MRN: [id number] Date of Birth: 1945-11-20

## 2016-05-07 ENCOUNTER — Ambulatory Visit: Payer: Medicare Other

## 2016-05-19 ENCOUNTER — Ambulatory Visit: Payer: Medicare Other | Attending: Surgery

## 2016-05-19 DIAGNOSIS — R293 Abnormal posture: Secondary | ICD-10-CM | POA: Insufficient documentation

## 2016-05-19 DIAGNOSIS — M25512 Pain in left shoulder: Secondary | ICD-10-CM | POA: Diagnosis present

## 2016-05-19 DIAGNOSIS — M25511 Pain in right shoulder: Secondary | ICD-10-CM | POA: Diagnosis present

## 2016-05-19 NOTE — Therapy (Signed)
[place] MAIN [facility] SERVICES [place], [place], [address] Phone: [phone]   Fax:  [phone]  Physical Therapy Treatment/ progress note  Patient Details  Name: Caitlin Leonard MRN: [id number] Date of Birth: Jun 23, 1946 Referring Provider: Dr. Roland Rack  Encounter Date: 05/19/2016      PT End of Session - 05/19/16 1611    Visit Number 6   Number of Visits 17   Date for PT Re-Evaluation 06/16/16   Authorization Type 1/10   PT Start Time 1516   PT Stop Time 1600   PT Time Calculation (min) 44 min   Activity Tolerance Patient tolerated treatment well   Behavior During Therapy [facility] for tasks assessed/performed      Past Medical History:  Diagnosis Date  . Anxiety   . Arthritis    osteoarthritis  . Asthma    due to seasonal alllergies  . Cataract   . Collagenous colitis 2010  . Depression    situational  . Dysrhythmia   . GERD (gastroesophageal reflux disease)   . Headache(784.0)    migraines; 2 x month  . Heart murmur    MVP ( symptomatic)  . History of IBS   . Hyperlipemia   . Hypertension    on medication x 10 years  . Shortness of breath    exertional  . Sleep apnea 2007   does not use CPAP since 40 # wt loss  . Syncopal episodes     Past Surgical History:  Procedure Laterality Date  . ABDOMINAL HYSTERECTOMY  1979  . BACK SURGERY    . CARDIAC CATHETERIZATION  2012   [facility]  . CHOLECYSTECTOMY  2011  . KNEE ARTHROSCOPY  1985, 1990, 2012 x 2   bilateral  . LUMBAR LAMINECTOMY  [id number]  . NASAL SINUS SURGERY  1994  . TONSILLECTOMY    . TOTAL KNEE ARTHROPLASTY  12/22/2011   Procedure: TOTAL KNEE ARTHROPLASTY;  Surgeon: Rudean Haskell, MD;  Location: [place];  Service: Orthopedics;  Laterality: Left;    There were no vitals filed for this visit.      Subjective Assessment - 05/19/16 1609    Subjective Pt reports traveling for vacation last week and states that she had 5-7/10 bil shoulder pain throughout  the 5 hour drive.   Limitations Lifting;House hold activities   Patient Stated Goals to not hurt anymore, be able to travel without shoulder pain   Currently in Pain? Yes   Pain Score 5    Pain Location Shoulder   Pain Orientation Right;Left   Pain Onset More than a month ago     Therex: Low rows and scap retraction with GTB, 3x10   Cervical AROM reassessed: Flexion: 21 deg, painful Extension: 23 deg, non-painful Rotation: R: 44 deg   L: 65 deg, non-painful bil Lateral flexion: R: 18 deg, L: 24 deg, non-painful bil  Bil IR/ER with GTB and towel roll under elbow, 1x10 on RUE, min verbal cues for proper technique; pt felt light headed during first set and required seated rest break, BP 138/82; blood sugar 130 Pt opted to continue strengthening in sitting after extended rest break: seated IR/ER with GTB, 2x10 each; min verbal cues for proper technique      PT Education - 05/19/16 1611    Education provided Yes   Education Details progress, HEP, posture   Person(s) Educated Patient   Methods Explanation   Comprehension Verbalized understanding  PT Long Term Goals - 05/28/2016 1522      PT LONG TERM GOAL #1   Title pt will be independent with HEP to maximize function and decrease risk of reinjury.   Time 8   Period Weeks   Status On-going     PT LONG TERM GOAL #2   Title pt will have improved Quick Dash score by >8 points to demonstrate improved overall function and decreased pain.   Baseline 31 05/29/2023)   Time 8   Period Weeks   Status Achieved     PT LONG TERM GOAL #3   Title pt will be able to travel for >2 hours with <3/10 pain to demonstrate improved overall function and decreased pain.   Time 8   Period Weeks   Status On-going               Plan - 05/28/16 1611    Clinical Impression Statement SPT reassessed pt's goals this date and has made some progress since beginning therapy. She reports feeling 25% better and reports standing is the  most difficult thing for her to do because it feels like her shoulders can't hold her head on and she gets tired easily. Pt's cervical AROM has improved with decreased pain throughout since beginning therapy, but is still limited. Pt continues to have difficulty with pain while traveling for long distances. Treatment session limited this date due to patient feeling faint and SOB when performing standing shoulder strengthening exercises. SPT assessed pt's blood pressure and blood sugar when seated and both WNL. Pt symptoms decreased and she felt better following an extended seated rest break. Pt opted to continue strengthening while seated. Pt needs continued skilled PT intervention to maximize overall function and decrease pain.   Rehab Potential Fair   Clinical Impairments Affecting Rehab Potential chronicity of current issue, comorbidities   PT Frequency 2x / week   PT Duration 8 weeks   PT Treatment/Interventions ADLs/Self Care Home Management;Aquatic Therapy;Cryotherapy;Electrical Stimulation;Traction;Functional mobility training;Therapeutic activities;Therapeutic exercise;Neuromuscular re-education;Patient/family education;Manual techniques;Passive range of motion;Dry needling;Taping   PT Next Visit Plan HEP, manual, strengthening   Consulted and Agree with Plan of Care Patient      Patient will benefit from skilled therapeutic intervention in order to improve the following deficits and impairments:  Decreased activity tolerance, Decreased endurance, Decreased range of motion, Decreased strength, Hypomobility, Increased muscle spasms, Impaired UE functional use, Improper body mechanics, Postural dysfunction, Pain  Visit Diagnosis: Pain in left shoulder  Pain in right shoulder  Abnormal posture       G-Codes - 05/28/16 1656    Functional Assessment Tool Used quickdash   Carrying, Moving and Handling Objects Current Status [id number]) At least 20 percent but less than 40 percent impaired,  limited or restricted   Carrying, Moving and Handling Objects Goal Status [id number]) At least 1 percent but less than 20 percent impaired, limited or restricted      Problem List Patient Active Problem List   Diagnosis Date Noted  . Leg swelling 03/19/2016  . Essential hypertension 03/19/2016  . Obesity 09/10/2015  . Bronchitis, acute 04/11/2015  . OSA (obstructive sleep apnea) 10/18/2014  . Nodule of right lung 06/14/2014  . Dyspnea 06/13/2014  . Coronary artery calcification 04/17/2014  . Aneurysm, ascending aorta ([place]) 04/17/2014  . Chronic cough 02/24/2014  . Mold suspected exposure 02/24/2014   [name], SPT This entire session was performed under direct supervision and direction of a licensed therapist/therapist assistant . I have personally read,  edited and approve of the note as written. Gorden Harms. Tortorici, PT, DPT [id number]  Tortorici,Ashley 05/19/2016, 4:58 PM  [place] MAIN [facility] SERVICES [address] [place], [place], [address] Phone: [phone]   Fax:  [phone]  Name: Caitlin Leonard MRN: [id number] Date of Birth: [birth year]

## 2016-05-21 ENCOUNTER — Other Ambulatory Visit: Payer: Self-pay | Admitting: Internal Medicine

## 2016-05-26 DIAGNOSIS — S46912A Strain of unspecified muscle, fascia and tendon at shoulder and upper arm level, left arm, initial encounter: Secondary | ICD-10-CM | POA: Insufficient documentation

## 2016-05-31 ENCOUNTER — Encounter: Payer: Self-pay | Admitting: Internal Medicine

## 2016-06-02 ENCOUNTER — Encounter: Payer: Self-pay | Admitting: Cardiovascular Disease

## 2016-06-04 ENCOUNTER — Encounter: Payer: Self-pay | Admitting: Emergency Medicine

## 2016-06-04 ENCOUNTER — Emergency Department
Admission: EM | Admit: 2016-06-04 | Discharge: 2016-06-04 | Disposition: A | Discharge status: Home / Self Care | Payer: Medicare Other | Attending: Emergency Medicine | Admitting: Emergency Medicine

## 2016-06-04 ENCOUNTER — Emergency Department: Payer: Medicare Other

## 2016-06-04 DIAGNOSIS — J45909 Unspecified asthma, uncomplicated: Secondary | ICD-10-CM | POA: Insufficient documentation

## 2016-06-04 DIAGNOSIS — I10 Essential (primary) hypertension: Secondary | ICD-10-CM | POA: Diagnosis not present

## 2016-06-04 DIAGNOSIS — Z7982 Long term (current) use of aspirin: Secondary | ICD-10-CM | POA: Diagnosis not present

## 2016-06-04 DIAGNOSIS — Z791 Long term (current) use of non-steroidal anti-inflammatories (NSAID): Secondary | ICD-10-CM | POA: Diagnosis not present

## 2016-06-04 DIAGNOSIS — R55 Syncope and collapse: Secondary | ICD-10-CM | POA: Diagnosis present

## 2016-06-04 DIAGNOSIS — Z79899 Other long term (current) drug therapy: Secondary | ICD-10-CM | POA: Insufficient documentation

## 2016-06-04 DIAGNOSIS — R001 Bradycardia, unspecified: Secondary | ICD-10-CM | POA: Diagnosis not present

## 2016-06-04 DIAGNOSIS — R5383 Other fatigue: Secondary | ICD-10-CM | POA: Diagnosis not present

## 2016-06-04 DIAGNOSIS — R079 Chest pain, unspecified: Secondary | ICD-10-CM

## 2016-06-04 LAB — TROPONIN I: Troponin I: <0.03 ng/mL (ref <0.03)

## 2016-06-04 LAB — BASIC METABOLIC PANEL
Anion gap: 8 (ref 5–15)
BUN: 23 mg/dL — AB (ref 6–20)
CHLORIDE: 95 mmol/L — AB (ref 101–111)
CO2: 25 mmol/L (ref 22–32)
CREATININE: 0.89 mg/dL (ref 0.44–1.00)
Calcium: 9.4 mg/dL (ref 8.9–10.3)
GFR calc Af Amer: >60 mL/min (ref >60)
GFR calc non Af Amer: >60 mL/min (ref >60)
GLUCOSE: 119 mg/dL — AB (ref 65–99)
POTASSIUM: 4.1 mmol/L (ref 3.5–5.1)
SODIUM: 128 mmol/L — AB (ref 135–145)

## 2016-06-04 LAB — CBC
HEMATOCRIT: 39.6 % (ref 35.0–47.0)
Hemoglobin: 13.8 g/dL (ref 12.0–16.0)
MCH: 30.7 pg (ref 26.0–34.0)
MCHC: 34.8 g/dL (ref 32.0–36.0)
MCV: 88.2 fL (ref 80.0–100.0)
Platelets: 293 10*3/uL (ref 150–440)
RBC: 4.49 MIL/uL (ref 3.80–5.20)
RDW: 16.1 % — ABNORMAL HIGH (ref 11.5–14.5)
WBC: 11.9 10*3/uL — AB (ref 3.6–11.0)

## 2016-06-04 NOTE — Discharge Instructions (Signed)
Please seek medical attention for any high fevers, chest pain, shortness of breath, change in behavior, persistent vomiting, bloody stool or any other new or concerning symptoms.  

## 2016-06-04 NOTE — ED Provider Notes (Signed)
[facility] Emergency Department Provider Note   ____________________________________________   I have reviewed the triage vital signs and the nursing notes.   HISTORY  Chief Complaint Bradycardia and Fatigue   History limited by: Not Limited   HPI Caitlin Leonard is a 70 y.o. female who presents to the emergency department today because of concern for bradycardia and near syncope. The patient was at an exercise class today when she started to feel extremely fatigued. Her pulse rate was checked and it was found to be in the 40s. The patient has noticed a slight downward trend in her pulse rate over the past month. It has gone from the 60s to the 40s. She denies any recent change in her medication. No new doses. The patient states that 3 days ago she had an episode of chest pain. It was squeezing. It is located in her central chest. Lasted for a few hours. Since then she has had a dull ache in her chest. She did not notice any worsening of her chronic shortness of breath. No recent fevers. No nausea or vomiting.   Past Medical History:  Diagnosis Date  . Anxiety   . Arthritis    osteoarthritis  . Asthma    due to seasonal alllergies  . Cataract   . Collagenous colitis 2010  . Depression    situational  . Dysrhythmia   . GERD (gastroesophageal reflux disease)   . Headache(784.0)    migraines; 2 x month  . Heart murmur    MVP ( symptomatic)  . History of IBS   . Hyperlipemia   . Hypertension    on medication x 10 years  . Shortness of breath    exertional  . Sleep apnea 2007   does not use CPAP since 40 # wt loss  . Syncopal episodes     Patient Active Problem List   Diagnosis Date Noted  . Leg swelling 03/19/2016  . Essential hypertension 03/19/2016  . Obesity 09/10/2015  . Bronchitis, acute 04/11/2015  . OSA (obstructive sleep apnea) 10/18/2014  . Nodule of right lung 06/14/2014  . Dyspnea 06/13/2014  . Coronary artery calcification  04/17/2014  . Aneurysm, ascending aorta ([place]) 04/17/2014  . Chronic cough 02/24/2014  . Mold suspected exposure 02/24/2014    Past Surgical History:  Procedure Laterality Date  . ABDOMINAL HYSTERECTOMY  1979  . BACK SURGERY    . CARDIAC CATHETERIZATION  2012   [facility]  . CHOLECYSTECTOMY  2011  . KNEE ARTHROSCOPY  1985, 1990, 2012 x 2   bilateral  . LUMBAR LAMINECTOMY  [id number]  . NASAL SINUS SURGERY  1994  . TONSILLECTOMY    . TOTAL KNEE ARTHROPLASTY  12/22/2011   Procedure: TOTAL KNEE ARTHROPLASTY;  Surgeon: Rudean Haskell, MD;  Location: [place];  Service: Orthopedics;  Laterality: Left;    Prior to Admission medications   Medication Sig Start Date End Date Taking? Authorizing Provider  albuterol (PROVENTIL HFA;VENTOLIN HFA) 108 (90 BASE) MCG/ACT inhaler Inhale 1-2 puffs into the lungs every 6 (six) hours as needed for wheezing or shortness of breath. 01/29/15   Minna Merritts, MD  ALPRAZolam Duanne Moron) 0.5 MG tablet Take 1 tablet by mouth at bedtime.    Historical Provider, MD  Azelastine HCl 0.15 % SOLN Place 1 spray into the nose 2 (two) times daily as needed. 01/29/15   Minna Merritts, MD  CARTIA XT 180 MG 24 hr capsule TAKE 1 CAPSULE(180 MG) BY MOUTH DAILY 02/07/16  Minna Merritts, MD  diltiazem (CARDIZEM) 30 MG tablet TAKE 1 TABLET(30 MG) BY MOUTH THREE TIMES DAILY AS NEEDED Patient taking differently: TAKE 1 TABLET(30 MG) BY MOUTH DAILY 05/21/15   Minna Merritts, MD  EPIPEN 2-PAK 0.3 MG/0.3ML SOAJ injection as needed.    Historical Provider, MD  fluticasone-salmeterol (ADVAIR HFA) 115-21 MCG/ACT inhaler Inhale 2 puffs into the lungs 2 (two) times daily.    Historical Provider, MD  furosemide (LASIX) 20 MG tablet Take 1 tablet (20 mg total) by mouth 2 (two) times daily as needed. 03/19/16   Minna Merritts, MD  gabapentin (NEURONTIN) 300 MG capsule Take 1 capsule (300 mg total) by mouth 3 (three) times daily. 05/22/16   Brand Males, MD  GARCINIA CAMBOGIA-CHROMIUM PO Take 1  capsule by mouth daily.    Historical Provider, MD  HYDROcodone-acetaminophen (NORCO) 10-325 MG tablet Take 1 tablet by mouth every 6 (six) hours as needed.    Historical Provider, MD  Melatonin 10 MG TABS Take 10 mg by mouth daily.    Historical Provider, MD  montelukast (SINGULAIR) 10 MG tablet Take 1 tablet by mouth daily.    Historical Provider, MD  propranolol (INDERAL) 20 MG tablet TAKE 1 TABLET(20 MG) BY MOUTH THREE TIMES DAILY AS NEEDED Patient taking differently: TAKE 1 TABLET(20 MG) BY MOUTH DAILY 05/21/15   Minna Merritts, MD  sertraline (ZOLOFT) 100 MG tablet Take 100 mg by mouth 2 (two) times daily.     Historical Provider, MD  simvastatin (ZOCOR) 20 MG tablet TAKE 1 TABLET BY MOUTH EVERY EVENING 01/08/16   Larey Dresser, MD    Allergies Contrast media [iodinated diagnostic agents]; Banana; Hydroxychloroquine sulfate; Oysters [shellfish allergy]; Sulfonamide derivatives; and Metronidazole  Family History  Problem Relation Age of Onset  . Hypertension Mother   . Hyperlipidemia Mother   . Diabetes Mother   . Hypertension Father   . Hyperlipidemia Father   . Arrhythmia Father     A-Fib  . Diabetes Paternal Uncle   . Diabetes Paternal Grandfather   . Anesthesia problems Neg Hx     Social History Social History  Substance Use Topics  . Smoking status: Never Smoker  . Smokeless tobacco: Never Used  . Alcohol use 0.6 oz/week    1 Glasses of wine per week     Comment: occasional wine - less than once a month    Review of Systems  Constitutional: Negative for fever. Cardiovascular: Positive for chest pain. Respiratory: Positive for shortness of breath. Gastrointestinal: Negative for abdominal pain, vomiting and diarrhea. Neurological: Negative for headaches, focal weakness or numbness.   10-point ROS otherwise negative.  ____________________________________________   PHYSICAL EXAM:  VITAL SIGNS: ED Triage Vitals  Enc Vitals Group     BP 06/04/16 1418  121/71     Pulse Rate 06/04/16 1418 63     Resp 06/04/16 1418 17     Temp 06/04/16 1418 98.1 F (36.7 C)     Temp Source 06/04/16 1418 Oral     SpO2 06/04/16 1418 96 %     Weight 06/04/16 1419 230 lb (104.3 kg)     Height 06/04/16 1419 5\' 8"  (1.727 m)     Head Circumference --      Peak Flow --      Pain Score 06/04/16 1423 4   Constitutional: Alert and oriented. Well appearing and in no distress. Eyes: Conjunctivae are normal. Normal extraocular movements. ENT   Head: Normocephalic and atraumatic.  Nose: No congestion/rhinnorhea.   Mouth/Throat: Mucous membranes are moist.   Neck: No stridor. Hematological/Lymphatic/Immunilogical: No cervical lymphadenopathy. Cardiovascular: Bradycardic, regular rhythm.  No murmurs, rubs, or gallops. Respiratory: Normal respiratory effort without tachypnea nor retractions. Breath sounds are clear and equal bilaterally. No wheezes/rales/rhonchi. Gastrointestinal: Soft and nontender. No distention.  Genitourinary: Deferred Musculoskeletal: Normal range of motion in all extremities. No lower extremity edema. Neurologic:  Normal speech and language. No gross focal neurologic deficits are appreciated.  Skin:  Skin is warm, dry and intact. No rash noted. Psychiatric: Mood and affect are normal. Speech and behavior are normal. Patient exhibits appropriate insight and judgment.  ____________________________________________    LABS (pertinent positives/negatives)  Labs Reviewed  CBC - Abnormal; Notable for the following:       Result Value   WBC 11.9 (*)    RDW 16.1 (*)    All other components within normal limits  BASIC METABOLIC PANEL - Abnormal; Notable for the following:    Sodium 128 (*)    Chloride 95 (*)    Glucose, Bld 119 (*)    BUN 23 (*)    All other components within normal limits  TROPONIN I  TROPONIN I     ____________________________________________   EKG  I, Nance Pear, attending physician, personally  viewed and interpreted this EKG  EKG Time: 1415 Rate: 58 Rhythm: sinus bradycardia Axis: normal Intervals: qtc 439 QRS: narrow ST changes: no st elevation Impression: abnormal ekg   ____________________________________________    RADIOLOGY  CXR   IMPRESSION:  1. No radiographic evidence of acute cardiopulmonary disease.  2. Atherosclerosis.     ___________________________________________   PROCEDURES  Procedures  ____________________________________________   INITIAL IMPRESSION / ASSESSMENT AND PLAN / ED COURSE  Pertinent labs & imaging results that were available during my care of the patient were reviewed by me and considered in my medical decision making (see chart for details).  Patient presents to the emergency department today because of concern for fatigue, near syncope and bradycardia. No recent change in medication. Did have an episode of chest pain a few days ago. EKG without any acute findings. Will check blood work including troponin.  Clinical Course   2 sets of troponin negative. The patient's sodium level was low however she states that she chronically gets low and has been around 128 in the past. Did have the patient get up and ambulate around the department without any difficulty. While patient follow-up with cardiologist. ____________________________________________   FINAL CLINICAL IMPRESSION(S) / ED DIAGNOSES  Final diagnoses:  Chest pain     Note: This dictation was prepared with Dragon dictation. Any transcriptional errors that result from this process are unintentional    Nance Pear, MD 06/04/16 2022

## 2016-06-04 NOTE — ED Notes (Signed)
Signature pad not working.  Pt verbalized understanding of discharge instructions and has no further questions at this time.

## 2016-06-04 NOTE — ED Notes (Signed)
Pt ambulated to hallway with this nurse while being monitored.  Pt ambulated with steady gait, denies pain or SOB, states some weakness in legs.  VSS during ambulation.

## 2016-06-04 NOTE — ED Triage Notes (Signed)
Pt was downstairs in exercise class today and heart rate dropped down in the 40's so pt was advised to come to ED for further eval. Pt c/o weakness at this time and also had some chest pain radiating up into jaw on Sunday.

## 2016-06-05 ENCOUNTER — Encounter: Payer: Self-pay | Admitting: Cardiovascular Disease

## 2016-06-13 ENCOUNTER — Encounter: Payer: Self-pay | Admitting: Cardiovascular Disease

## 2016-06-13 ENCOUNTER — Ambulatory Visit (INDEPENDENT_AMBULATORY_CARE_PROVIDER_SITE_OTHER): Payer: Medicare Other | Admitting: Cardiovascular Disease

## 2016-06-13 VITALS — BP 110/62 | HR 66 | Ht 68.0 in | Wt 229.5 lb

## 2016-06-13 DIAGNOSIS — I2584 Coronary atherosclerosis due to calcified coronary lesion: Secondary | ICD-10-CM | POA: Diagnosis not present

## 2016-06-13 DIAGNOSIS — I7121 Aneurysm of the ascending aorta, without rupture: Secondary | ICD-10-CM

## 2016-06-13 DIAGNOSIS — I10 Essential (primary) hypertension: Secondary | ICD-10-CM

## 2016-06-13 DIAGNOSIS — R079 Chest pain, unspecified: Secondary | ICD-10-CM

## 2016-06-13 DIAGNOSIS — I712 Thoracic aortic aneurysm, without rupture: Secondary | ICD-10-CM | POA: Diagnosis not present

## 2016-06-13 DIAGNOSIS — I2510 Atherosclerotic heart disease of native coronary artery without angina pectoris: Secondary | ICD-10-CM | POA: Diagnosis not present

## 2016-06-13 DIAGNOSIS — M7989 Other specified soft tissue disorders: Secondary | ICD-10-CM | POA: Diagnosis not present

## 2016-06-13 DIAGNOSIS — R053 Chronic cough: Secondary | ICD-10-CM

## 2016-06-13 DIAGNOSIS — R05 Cough: Secondary | ICD-10-CM

## 2016-06-13 DIAGNOSIS — E669 Obesity, unspecified: Secondary | ICD-10-CM

## 2016-06-13 NOTE — Progress Notes (Signed)
Cardiology Office Note  Date:  06/13/2016   ID:  Caitlin Leonard, DOB 29-Apr-1946, MRN [id number]  PCP:  Maryland Pink, MD   Chief Complaint  Patient presents with  . Other    Follow up from [facility] ER. Meds reviewed by the patient verbally. Pt. c/o feeling weak.     HPI:  Caitlin Leonard is a 70 year old woman with history of obesity, chronic cough, chronic exertional dyspnea, who presents for routine follow-up of her cough and shortness of breath Previously seen by pulmonary for her exertional shortness of breath and chronic cough. She has 4 cm ascending aorta on CT scan, three-vessel CAD Most recent CT scan February 2017  in follow-up today, she has been seen in the emergency room 06/04/2016 for bradycardia, near syncope Lab work reviewed with her showing elevated BUN, drop in her CO2 consistent with dehydration. She continues to take Lasix 40 mg daily for lower extremity edema She takes diltiazem 30 mg, propranolol 20 mg, also takes diltiazem 180 mg Unclear why she is taking the low-dose diltiazem and propranolol EKG in the emergency room showed heart rate 58 bpm  Since her discharge from the hospital she has stayed on Lasix 40 mg daily Reports having frequent orthostasis/lightheadedness  She has rare brief episodes of chest pain described as a lightening bolt through her chest up into her neck, typically not associated with exertion  [facility] symptoms [facility]  Orthostatics done in the office today show blood pressure 140/104 supine, 100/70 with sitting  felt dizzy  EKG on today's visit shows normal sinus rhythm with rate 66 bpm, nonspecific ST and T wave abnormality  Other past medical history Previous Review of CT scan  showing ascending aorta aneurysm Mild coronary artery disease This was seen previously on CT scan May 2016 She is followed by pulmonary for chronic cough  hemoglobin A1c typically in the 6 range, improved in the past with weight loss and exercise  She did  complete a exercise VO2 max study with suggestion she had a circulatory issue. She has completed pulmonary rehabilitation, now in [facility]  Lost both of her parents in 2016, significant stress  Previously seen by Loralie Champagne as well as cardiology through [facility] for her symptoms. Symptoms of shortness of breath for more than 6 months, very limiting. No regular exercise as she feels unable to ambulate. Also reports having tachycardia with minimal exertion. Periods of near syncope at times, occasional chest pain  PFTs July 2015 which were normal, chest CT scan May 2015, also again not showing interstitial lung disease Echocardiogram July 2015 showing mildly elevated right heart pressures, right ventricular systolic pressure of 40 Right and left heart catheterizations November 2015 showing no significant CAD, filling pressures were not elevated Possible sleep apnea Holter monitor has been performed showing APCs and PVCs She has been tried on Lasix daily but felt dehydrated Continues on Advair  Includes carotid ultrasounds from August 2010 showing minimal carotid plaquing  Staged symptom limited exercise treadmill testing implies that she has mild to moderate functional limitation, diastolic dysfunction suggested  PMH:   has a past medical history of Anxiety; Arthritis; Asthma; Cataract; Collagenous colitis (2010); Depression; Dysrhythmia; GERD (gastroesophageal reflux disease); Headache(784.0); Heart murmur; History of IBS; Hyperlipemia; Hypertension; Shortness of breath; Sleep apnea (2007); and Syncopal episodes.  PSH:    Past Surgical History:  Procedure Laterality Date  . ABDOMINAL HYSTERECTOMY  1979  . BACK SURGERY    . CARDIAC CATHETERIZATION  2012   [facility]  .  CHOLECYSTECTOMY  2011  . KNEE ARTHROSCOPY  1985, 1990, 2012 x 2   bilateral  . LUMBAR LAMINECTOMY  [id number]  . NASAL SINUS SURGERY  1994  . TONSILLECTOMY    . TOTAL KNEE ARTHROPLASTY  12/22/2011   Procedure:  TOTAL KNEE ARTHROPLASTY;  Surgeon: Rudean Haskell, MD;  Location: [place];  Service: Orthopedics;  Laterality: Left;    Current Outpatient Prescriptions  Medication Sig Dispense Refill  . albuterol (PROVENTIL HFA;VENTOLIN HFA) 108 (90 BASE) MCG/ACT inhaler Inhale 1-2 puffs into the lungs every 6 (six) hours as needed for wheezing or shortness of breath. 1 Inhaler 3  . ALPRAZolam (XANAX) 0.5 MG tablet Take 1 tablet by mouth at bedtime.    Marland Kitchen aspirin EC 81 MG tablet Take 81 mg by mouth daily.    . Azelastine HCl 0.15 % SOLN Place 1 spray into the nose 2 (two) times daily as needed. 30 mL 1  . CARTIA XT 180 MG 24 hr capsule TAKE 1 CAPSULE(180 MG) BY MOUTH DAILY 90 capsule 3  . EPIPEN 2-PAK 0.3 MG/0.3ML SOAJ injection as needed.    . furosemide (LASIX) 20 MG tablet Take 1 tablet (20 mg total) by mouth 2 (two) times daily as needed. (Patient taking differently: Take 40 mg by mouth daily. ) 180 tablet 3  . gabapentin (NEURONTIN) 300 MG capsule Take 1 capsule (300 mg total) by mouth 3 (three) times daily. (Patient taking differently: Take 300-600 mg by mouth 2 (two) times daily. 600mg  in the morning and 300mg  in the evening) 90 capsule 2  . Ibuprofen-Diphenhydramine Cit (ADVIL PM) 200-38 MG TABS Take 3 tablets by mouth at bedtime.    . Melatonin 10 MG TABS Take 10 mg by mouth daily.    . montelukast (SINGULAIR) 10 MG tablet Take 1 tablet by mouth daily.    Marland Kitchen omeprazole (PRILOSEC OTC) 20 MG tablet Take 20 mg by mouth daily.    . propranolol (INDERAL) 20 MG tablet TAKE 1 TABLET(20 MG) BY MOUTH THREE TIMES DAILY AS NEEDED (Patient taking differently: TAKE 1 TABLET(20 MG) BY MOUTH DAILY) 90 tablet 3  . sertraline (ZOLOFT) 100 MG tablet Take 50 mg by mouth 2 (two) times daily.     . simvastatin (ZOCOR) 20 MG tablet TAKE 1 TABLET BY MOUTH EVERY EVENING 90 tablet 3   No current facility-administered medications for this visit.      Allergies:   Contrast media [iodinated diagnostic agents];  Hydroxychloroquine sulfate; Banana; Oysters [shellfish allergy]; Sulfonamide derivatives; and Metronidazole   Social History:  The patient  reports that she has never smoked. She has never used smokeless tobacco. She reports that she drinks about 0.6 oz of alcohol per week . She reports that she does not use drugs.   Family History:   family history includes Arrhythmia in her father; Diabetes in her mother, paternal grandfather, and paternal uncle; Hyperlipidemia in her father and mother; Hypertension in her father and mother.    Review of Systems: Review of Systems  Respiratory: Negative.   Cardiovascular: Negative.   Gastrointestinal: Negative.   Musculoskeletal: Negative.   Neurological: Positive for dizziness and weakness.  Psychiatric/Behavioral: Negative.   All other systems reviewed and are negative.    PHYSICAL EXAM: VS:  BP 110/62 (BP Location: Left Arm, Patient Position: Sitting, Cuff Size: Large)   Pulse 66   Ht 5\' 8"  (1.727 m)   Wt 229 lb 8 oz (104.1 kg)   BMI 34.90 kg/m  , BMI Body mass index  is 34.9 kg/m. GEN: Well nourished, well developed, in no acute distress  HEENT: normal  Neck: no JVD, carotid bruits, or masses Cardiac: RRR; no murmurs, rubs, or gallops,no edema  Respiratory:  clear to auscultation bilaterally, normal work of breathing GI: soft, nontender, nondistended, + BS MS: no deformity or atrophy  Skin: warm and dry, no rash Neuro:  Strength and sensation are intact Psych: euthymic mood, full affect   Recent Labs: 06/04/2016: BUN 23; Creatinine, Ser 0.89; Hemoglobin 13.8; Platelets 293; Potassium 4.1; Sodium 128    Lipid Panel No results found for: CHOL, HDL, LDLCALC, TRIG    Wt Readings from Last 3 Encounters:  06/13/16 229 lb 8 oz (104.1 kg)  06/04/16 230 lb (104.3 kg)  03/19/16 233 lb 8 oz (105.9 kg)       ASSESSMENT AND PLAN:  Coronary artery calcification - Plan: EKG 12-Lead Known coronary artery disease, stressed importance of  aggressive diabetes and cholesterol control  Chest pain Atypical in nature, no further testing at this time Likely musculoskeletal  Aneurysm, ascending aorta (HCC) - Plan: EKG 12-Lead We'll recommend periodic ultrasound, consider repeat echocardiogram next year  Essential hypertension - Plan: EKG 12-Lead Blood pressure very low, recommended she stop her Lasix for several days then decrease Lasix down to no more than every other day, preferably only as needed Also recommended she hold propranolol, diltiazem 30 mg pills. Unclear why she was taking these  Leg swelling From venous insufficiency. Lasix does not seem to be helping, only making her dehydrated and orthostatic Recommended compression hose  Obesity We have encouraged continued exercise, careful diet management in an effort to lose weight. She would like to continue work out at [facility]  Chronic cough She reports cough seems to have improved Previously felt to be bronchospastic cough   Total encounter time more than 25 minutes  Greater than 50% was spent in counseling and coordination of care with the patient   Disposition:   F/U  12 months   Orders Placed This Encounter  Procedures  . EKG 12-Lead     Signed, Esmond Plants, M.D., Ph.D. 06/13/2016  [place], [place]

## 2016-06-13 NOTE — Patient Instructions (Addendum)
Medication Instructions:   Hold the diltiazem 30 and the propranolol (these can slow heart rate)  Hold lasix for a few days,  Then restart only as needed Drink more fluids  Compression for travel  Labwork:  No new labs needed  Testing/Procedures:  No further testing at this time   Follow-Up: It was a pleasure seeing you in the office today. Please call us if you have new issues that need to be addressed before your next appt.  [phone]  Your physician wants you to follow-up in: 6 months.  You will receive a reminder letter in the mail two months in advance. If you don't receive a letter, please call our office to schedule the follow-up appointment.  If you need a refill on your cardiac medications before your next appointment, please call your pharmacy.

## 2016-07-13 ENCOUNTER — Encounter: Payer: Self-pay | Admitting: Cardiovascular Disease

## 2016-08-06 ENCOUNTER — Encounter: Payer: Self-pay | Admitting: Cardiovascular Disease

## 2016-08-15 ENCOUNTER — Other Ambulatory Visit: Payer: Self-pay | Admitting: Internal Medicine

## 2016-09-01 ENCOUNTER — Encounter: Payer: Self-pay | Admitting: Gastroenterology

## 2016-09-09 ENCOUNTER — Ambulatory Visit (INDEPENDENT_AMBULATORY_CARE_PROVIDER_SITE_OTHER): Payer: Medicare Other | Admitting: Pulmonary Disease

## 2016-09-09 ENCOUNTER — Encounter: Payer: Self-pay | Admitting: Pulmonary Disease

## 2016-09-09 VITALS — BP 128/74 | HR 85 | Ht 69.0 in | Wt 238.0 lb

## 2016-09-09 DIAGNOSIS — R490 Dysphonia: Secondary | ICD-10-CM | POA: Diagnosis not present

## 2016-09-09 DIAGNOSIS — R05 Cough: Secondary | ICD-10-CM | POA: Diagnosis not present

## 2016-09-09 DIAGNOSIS — R059 Cough, unspecified: Secondary | ICD-10-CM

## 2016-09-09 MED ORDER — BUDESONIDE-FORMOTEROL FUMARATE 80-4.5 MCG/ACT IN AERO: 2.0000 | INHALATION_SPRAY | Freq: Two times a day (BID) | RESPIRATORY_TRACT | 0 refills | Status: DC

## 2016-09-09 MED ORDER — BUDESONIDE-FORMOTEROL FUMARATE 80-4.5 MCG/ACT IN AERO: 2.0000 | INHALATION_SPRAY | Freq: Two times a day (BID) | RESPIRATORY_TRACT | 12 refills | Status: DC

## 2016-09-09 NOTE — Progress Notes (Signed)
PULMONARY CONSULT NOTE  Requesting MD/Service: Kary Kos Date of initial consultation: 09/09/16 Reason for consultation: Chronic cough  PT PROFILE: 70 y.o. F never smoker with chronic severe cough of > 4 yrs duration, previously evaluated by Dr Chase Caller and Dr Raul Del. She has also been evaluated by ENT Tami Ribas) previously.Despite multiple therapeutic attempts, there have been no interventions or therapies that have alleviated her cough  DATA: PFTs 04/17/14: No obstruction, normal lung volumes, minimal decrease in DLCO HRCT 11/20/15: Normal CT sinuses 11/20/15: Normal Spirometry 09/09/16: No obstruction  HPI:  As above. She can define the precise day of onset of her cough which she notes occurred on an airplane on a rainy day. However, review of Dr Golden Pop prevous notes seem to indicate a different story of onset (onset during gardening). Nonetheless, it has persisted essentially unabated since its onset. She has been tried on therapies for allergies, chronic sinusitis, asthmatic bronchitis and GERD and is unable to discern any benefit from any of these therapies. Tussionex helps suppress her cough. She also thinks that albuterol helps but Advair was not beneficial and might have worsened it. She has undergone sinus surgery in 2004. She is scheduled to see Dr Ardis Hughs of GI medicine next month. Presently her cough occurs daily in paroxysms with minimal sputum production and is associated with throat irritation and hoarseness. It is exacerbated by talking and exertion. It seems to be worse in the autumn and spring.   Past Medical History:  Diagnosis Date  . Anxiety   . Arthritis    osteoarthritis  . Asthma    due to seasonal alllergies  . Cataract   . Collagenous colitis 2010  . Depression    situational  . Dysrhythmia   . GERD (gastroesophageal reflux disease)   . Headache(784.0)    migraines; 2 x month  . Heart murmur    MVP ( symptomatic)  . History of IBS   . Hyperlipemia    . Hypertension    on medication x 10 years  . Shortness of breath    exertional  . Sleep apnea 2007   does not use CPAP since 40 # wt loss  . Syncopal episodes     Past Surgical History:  Procedure Laterality Date  . ABDOMINAL HYSTERECTOMY  1979  . BACK SURGERY    . CARDIAC CATHETERIZATION  2012   [facility]  . CHOLECYSTECTOMY  2011  . KNEE ARTHROSCOPY  1985, 1990, 2012 x 2   bilateral  . LUMBAR LAMINECTOMY  [id number]  . NASAL SINUS SURGERY  1994  . TONSILLECTOMY    . TOTAL KNEE ARTHROPLASTY  12/22/2011   Procedure: TOTAL KNEE ARTHROPLASTY;  Surgeon: Rudean Haskell, MD;  Location: [place];  Service: Orthopedics;  Laterality: Left;    MEDICATIONS: I have reviewed all medications and confirmed regimen as documented  Social History   Social History  . Marital status: Married    Spouse name: N/A  . Number of children: N/A  . Years of education: N/A   Occupational History  . retired Pharmacist, hospital    Social History Main Topics  . Smoking status: Never Smoker  . Smokeless tobacco: Never Used  . Alcohol use 0.6 oz/week    1 Glasses of wine per week     Comment: occasional wine - less than once a month  . Drug use: No  . Sexual activity: No   Other Topics Concern  . Not on file   Social History Narrative  . No narrative on file  Family History  Problem Relation Age of Onset  . Hypertension Mother   . Hyperlipidemia Mother   . Diabetes Mother   . Hypertension Father   . Hyperlipidemia Father   . Arrhythmia Father     A-Fib  . Diabetes Paternal Uncle   . Diabetes Paternal Grandfather   . Anesthesia problems Neg Hx     ROS: No fever, myalgias/arthralgias, unexplained weight loss or weight gain No new focal weakness or sensory deficits No otalgia, hearing loss, visual changes, nasal and sinus symptoms, mouth and throat problems No neck pain or adenopathy No abdominal pain, N/V/D, diarrhea, change in bowel pattern No dysuria, change in urinary pattern   Vitals:    09/09/16 1020  BP: 128/74  Pulse: 85  SpO2: 96%  Weight: 238 lb (108 kg)  Height: 5\' 9"  (1.753 m)     EXAM: Gen: Obese, intermittent coughing, hoarse voice quality, No overt respiratory distress HEENT: ear canals and TMs normal, nares patent and nasal mucosa normal, oropharynx normal Neck: Supple without LAN, thyromegaly, JVD Lungs: breath sounds: full and slightly coarse, percussion: normal, No wheezes or other adventitious sounds Cardiovascular: RRR, no murmurs noted Abdomen: Soft, nontender, normal BS Ext: without clubbing, cyanosis, edema Neuro: CNs grossly intact, motor and sensory intact Skin: Limited exam, no lesions noted  DATA:   BMP Latest Ref Rng & Units 06/04/2016 12/30/2014 10/17/2014  Glucose 65 - 99 mg/dL 119(H) 167(H) 140(H)  BUN 6 - 20 mg/dL 23(H) 7 12  Creatinine 0.44 - 1.00 mg/dL 0.89 0.65 0.72  Sodium 135 - 145 mmol/L 128(L) 134(L) 136  Potassium 3.5 - 5.1 mmol/L 4.1 3.2(L) 4.7  Chloride 101 - 111 mmol/L 95(L) 101 105  CO2 22 - 32 mmol/L 25 24 20   Calcium 8.9 - 10.3 mg/dL 9.4 8.9 9.7    CBC Latest Ref Rng & Units 06/04/2016 12/30/2014 10/17/2014  WBC 3.6 - 11.0 K/uL 11.9(H) 7.6 9.5  Hemoglobin 12.0 - 16.0 g/dL 13.8 10.7(L) 13.3  Hematocrit 35.0 - 47.0 % 39.6 33.2(L) 40.5  Platelets 150 - 440 K/uL 293 280 274    CXR:  No recent CXR  IMPRESSION:   Chronic cough of 4 years duration refractory to all attempted therapies. There are a few clues to potential etiologies. Hoarseness suggests laryngeal irritation, perhaps due to LPR but could also be a consequence of persistent cough. Response to albuterol suggests a syndrome of lower airways hyperreactivity/asthma. At this point, it is posible that the cause at its onset is no longer the principle cause of its perpetuation. It is unlikely that we will define a single precise etiology and rather should focus on a management strategy. It is of note that she has never undergone bronchoscopy.   PLAN:  Spirometry  performed today and reviewed above 1) continue omeprazole 20 mg daily 2) Begin Symbicort 80/4.5 - 2 actuations BID 3) Continue albuterol inhaler PRN 4) Follow up in 4-6 weeks and if no better, will consider bronchoscopy and/or referral back to ENT Tami Ribas)  Merton Border, MD PCCM service Mobile [phone] Pager [phone] 09/09/2016

## 2016-09-09 NOTE — Progress Notes (Signed)
Patient ID: Caitlin Leonard, female   DOB: Feb 18, 1946, 70 y.o.   MRN: [id number] Patient seen in the office today and instructed on use of symbicort aerosol.  Patient expressed understanding and demonstrated technique.

## 2016-09-09 NOTE — Patient Instructions (Signed)
Symbicort inhaler - 2 inhalations twice a day Spirometry today Follow up in 4-6 weeks. We will consider referral to ENT and/or bronchoscopy @ that time

## 2016-09-17 ENCOUNTER — Encounter: Payer: Self-pay | Admitting: Pulmonary Disease

## 2016-09-18 ENCOUNTER — Telehealth: Payer: Self-pay

## 2016-09-18 NOTE — Telephone Encounter (Signed)
Would start doxycycline 100 mg twice daily for 5 days.

## 2016-09-18 NOTE — Telephone Encounter (Signed)
Spoke with pt who c/o prod cough with yellow to clear mucus, wheezing, increased sob & chest discomfort, low grade temp of 100 & sweats X 1wk.   DR please advise. Thanks.  Please see email from 09-18-16

## 2016-09-18 NOTE — Telephone Encounter (Signed)
Complete the course of doxycycline as prescribed. Call back afterwards if not improved.

## 2016-09-18 NOTE — Telephone Encounter (Signed)
LMTCB in regards to pt's mychart message. Will await call back.

## 2016-09-18 NOTE — Telephone Encounter (Signed)
Spoke with pt who states she is currently on doxycycline, started on 09-18-16.   DR please advise. Thanks.

## 2016-09-18 NOTE — Telephone Encounter (Signed)
Pt aware of DR recommendations and voiced her understanding. Nothing further needed

## 2016-09-18 NOTE — Telephone Encounter (Signed)
-----   Message -----    From: Cornell Barman. Juliann Pares    Sent: 09/17/2016  3:23 PM EST      To: [name], MD Subject: Visit Follow-Up Question  I know you said you were going to spend an hour synthesizing all of my medical tests and illness. Here is another one to catalog: I have acute bronchitis for the third time in 4 months. I am on the same protocol as every time which has 3 outcomes get worse, stay the same, or get better for 4 or 5 weeks. It is steroid shot,  prednisone 20 mg. or 6-5-4-3-2-1, and an antibiotic.  You said we'd put Dr. Tami Ribas on the back burner. I forgot until I looked back, but Dr. Chase Caller already went that route. Dr. Tami Ribas said that there was nothing wrong with my vocal cords or throat. He's done. The only time that I have been this sick is when I have had pneumonia. I really need help!  Will route to DS for a FYI.  I have asked pt to contact our office to better assist her.

## 2016-09-25 ENCOUNTER — Telehealth: Payer: Self-pay

## 2016-09-25 MED ORDER — LEVOFLOXACIN 500 MG PO TABS: 500.0000 mg | ORAL_TABLET | Freq: Every day | ORAL | 0 refills | Status: AC

## 2016-09-25 NOTE — Telephone Encounter (Signed)
Pt needs refill on Tussin, Prednisone, and Doxycycline. Pt states she is no better. Please call. Pt states she has an appt on 1/5.

## 2016-09-25 NOTE — Telephone Encounter (Signed)
Pt aware of DS recommendations and voiced her understanding. Nothing further needed.

## 2016-09-25 NOTE — Telephone Encounter (Signed)
I have entered order for Levaquin for another 7 days. She has follow up with me scheduled in early January. If she is no better after the Levaquin, we will discuss next step in the evaluation at that appointment  Waunita Schooner

## 2016-09-25 NOTE — Telephone Encounter (Signed)
Spoke with pt who states she is currently on doxycycline and prednisone. Pt will finish both medications tomorrow. Pt states she hasn't any improvement, pt feels that she has gotten worse since being on these medications. Pt c/o prod cough with white to yellow mucus, wheezing, increased sob, chest discomfort X 71mo.  DS please advise. Thanks.

## 2016-10-09 ENCOUNTER — Ambulatory Visit
Admission: RE | Admit: 2016-10-09 | Discharge: 2016-10-09 | Disposition: A | Discharge status: Home / Self Care | Payer: Medicare Other | Source: Ambulatory Visit | Attending: Pulmonary Disease | Admitting: Pulmonary Disease

## 2016-10-09 ENCOUNTER — Ambulatory Visit (INDEPENDENT_AMBULATORY_CARE_PROVIDER_SITE_OTHER): Payer: Medicare Other | Admitting: Pulmonary Disease

## 2016-10-09 ENCOUNTER — Encounter: Payer: Self-pay | Admitting: Pulmonary Disease

## 2016-10-09 VITALS — BP 128/88 | HR 120 | Ht 69.0 in | Wt 235.0 lb

## 2016-10-09 DIAGNOSIS — R05 Cough: Secondary | ICD-10-CM | POA: Insufficient documentation

## 2016-10-09 DIAGNOSIS — R053 Chronic cough: Secondary | ICD-10-CM

## 2016-10-09 DIAGNOSIS — J3489 Other specified disorders of nose and nasal sinuses: Secondary | ICD-10-CM | POA: Diagnosis not present

## 2016-10-09 DIAGNOSIS — R059 Cough, unspecified: Secondary | ICD-10-CM

## 2016-10-09 NOTE — Addendum Note (Signed)
Addended by: Merton Border B on: 10/09/2016 04:17 PM   Modules accepted: Orders

## 2016-10-09 NOTE — Progress Notes (Addendum)
PULMONARY OFFICE FOLLOW UP NOTE  Requesting MD/Service: Kary Kos Date of initial consultation: 09/09/16 Reason for consultation: Chronic cough  PT PROFILE: 71 y.o. F never smoker with chronic severe cough of > 4 yrs duration, previously evaluated by Dr Chase Caller and Dr Raul Del. She has also been evaluated by ENT Tami Ribas) previously. Despite multiple therapeutic attempts, there have been no interventions or therapies that have alleviated her cough  DATA: PFTs 04/17/14: No obstruction, normal lung volumes, minimal decrease in DLCO CT chest 02/20/15: Stable small pulmonary nodules compatible with a benign process HRCT 11/20/15: Normal CT sinuses 11/20/15: Normal Spirometry 09/09/16: No obstruction  SUBJ: Returns today for routine re-evaluation. She continues to have persistent and debilitating cough which is deep and rattling but minimally productive. Historically, this cough has been nonproductive and has responded incompletely to prednisone. She denies CP, fever, purulent sputum, hemoptysis, LE edema and calf tenderness.    OBJ: Vitals:   10/09/16 1056  BP: 128/88  Pulse: (!) 120  SpO2: 95%  Weight: 235 lb (106.6 kg)  Height: 5\' 9"  (1.753 m)     EXAM: Gen: Obese, vigorous barking cough, slightly hoarse voice quality, No overt respiratory distress HEENT: ear canals and TMs normal, nasal septum perforation, oropharynx normal Neck: Supple without LAN, thyromegaly, JVD Lungs: breath sounds slightly coarse, no wheezes or other adventitious sounds Cardiovascular: Reg, no murmurs noted Abdomen: Soft, nontender, normal BS Ext: without clubbing, cyanosis, edema Neuro: CNs grossly intact, motor and sensory intact Skin: Limited exam, no lesions noted  DATA:   BMP Latest Ref Rng & Units 06/04/2016 12/30/2014 10/17/2014  Glucose 65 - 99 mg/dL 119(H) 167(H) 140(H)  BUN 6 - 20 mg/dL 23(H) 7 12  Creatinine 0.44 - 1.00 mg/dL 0.89 0.65 0.72  Sodium 135 - 145 mmol/L 128(L) 134(L) 136   Potassium 3.5 - 5.1 mmol/L 4.1 3.2(L) 4.7  Chloride 101 - 111 mmol/L 95(L) 101 105  CO2 22 - 32 mmol/L 25 24 20   Calcium 8.9 - 10.3 mg/dL 9.4 8.9 9.7    CBC Latest Ref Rng & Units 06/04/2016 12/30/2014 10/17/2014  WBC 3.6 - 11.0 K/uL 11.9(H) 7.6 9.5  Hemoglobin 12.0 - 16.0 g/dL 13.8 10.7(L) 13.3  Hematocrit 35.0 - 47.0 % 39.6 33.2(L) 40.5  Platelets 150 - 440 K/uL 293 280 274    CXR (10/10/15):  Possible vague nodular opacity in lingula, otherwise NACPD  IMPRESSION:   1) Chronic severe cough refractory to all empiric attempts @ therapy for sinusitis, GERD, asthmatic bronchitis 2) New finding of nasal setum perforation  PLAN:  1) continue current medical therapies 2) Check ANCA 3) Bronchoscopy planned for 10/10/16 4) ROV 3 weeks  This note serves as the H&P for the procedure 10/10/16  Merton Border, MD PCCM service Mobile [phone] Pager [phone] 10/09/2016   No changes in exam or complaints  Merton Border, MD PCCM service Mobile [phone] Pager [phone] 10/10/2016

## 2016-10-09 NOTE — Patient Instructions (Addendum)
Chest Xray today Bronchoscopy - planned 10/11/15 Follow up in 2-3 weeks

## 2016-10-10 ENCOUNTER — Encounter: Payer: Self-pay | Admitting: *Deleted

## 2016-10-10 ENCOUNTER — Encounter
Admission: RE | Disposition: A | Discharge status: Home / Self Care | Payer: Self-pay | Source: Ambulatory Visit | Attending: Pulmonary Disease

## 2016-10-10 ENCOUNTER — Ambulatory Visit
Admission: RE | Admit: 2016-10-10 | Discharge: 2016-10-10 | Disposition: A | Discharge status: Home / Self Care | Payer: Medicare Other | Source: Ambulatory Visit | Attending: Pulmonary Disease | Admitting: Pulmonary Disease

## 2016-10-10 DIAGNOSIS — R05 Cough: Secondary | ICD-10-CM | POA: Diagnosis not present

## 2016-10-10 DIAGNOSIS — J3489 Other specified disorders of nose and nasal sinuses: Secondary | ICD-10-CM | POA: Insufficient documentation

## 2016-10-10 HISTORY — PX: FLEXIBLE BRONCHOSCOPY: SHX5094

## 2016-10-10 LAB — SEDIMENTATION RATE: SED RATE: 22 mm/h (ref 0–30)

## 2016-10-10 SURGERY — BRONCHOSCOPY, FLEXIBLE
Anesthesia: Moderate Sedation

## 2016-10-10 MED ORDER — FENTANYL CITRATE (PF) 100 MCG/2ML IJ SOLN
INTRAMUSCULAR | Status: AC | PRN
  Administered 2016-10-10: 50 ug via INTRAVENOUS

## 2016-10-10 MED ORDER — BUTAMBEN-TETRACAINE-BENZOCAINE 2-2-14 % EX AERO
1.0000 | INHALATION_SPRAY | Freq: Once | CUTANEOUS | Status: DC
  Filled 2016-10-10: qty 20

## 2016-10-10 MED ORDER — MIDAZOLAM HCL 5 MG/5ML IJ SOLN
INTRAMUSCULAR | Status: AC | PRN
  Administered 2016-10-10 (×2): 2 mg via INTRAVENOUS

## 2016-10-10 MED ORDER — LIDOCAINE HCL 2 % EX GEL: 1.0000 "application " | Freq: Once | CUTANEOUS | Status: DC

## 2016-10-10 MED ORDER — PHENYLEPHRINE HCL 0.25 % NA SOLN
1.0000 | Freq: Four times a day (QID) | NASAL | Status: DC | PRN
  Filled 2016-10-10: qty 15

## 2016-10-10 MED ORDER — FENTANYL CITRATE (PF) 100 MCG/2ML IJ SOLN
INTRAMUSCULAR | Status: AC
  Filled 2016-10-10: qty 4

## 2016-10-10 MED ORDER — MIDAZOLAM HCL 5 MG/5ML IJ SOLN
INTRAMUSCULAR | Status: AC
  Filled 2016-10-10: qty 10

## 2016-10-10 NOTE — Progress Notes (Signed)
MD here to speak with patient and husband.

## 2016-10-10 NOTE — Procedures (Signed)
Indication:   Chronic severe cough  Premedication:   Fentanyl 50 mcg Midaz 4 mg  Anesthesia: Topical to nose and throat 40 cc of 1% lidocaine used during the course of procedure  Procedure: After adequate sedation and anesthesia, the bronchoscope was introduced via the R naris and advanced into the posterior pharynx. Further anesthesia was obtained with 1% lidocaine and the scope was advanced into the trachea. Complete airway anesthesia was achieved with 1% lidocaine and a thorough airway examination was performed. This revealed the following findings:  Findings:  Upper airway - nasal septal perforation present, normal pharynx and larynx, cords moved symmetrically Tracheobronchial anatomy - normal anatomical arrangement Bronchial mucosa - mildly erythematous Other - no endobronchial tumors, masses or foreign bodies, minimal clear secretions  Specimens:   BAL from Caitlin Leonard sent for cytology, bacterial culture and AFB Also, ESR, ANA, ANCA ordered (to assess nasal septal perf)  Complications: None  Post procedure evaluation:  The patient tolerated the procedure well with no major complications CXR - not indicated   Merton Border, MD PCCM service Mobile [phone] Pager [phone]  10/10/2016

## 2016-10-11 LAB — ANA COMPREHENSIVE PANEL
ENA SM Ab Ser-aCnc: <0.2 AI (ref 0.0–0.9)
SSA (Ro) (ENA) Antibody, IgG: <0.2 AI (ref 0.0–0.9)
Scleroderma (Scl-70) (ENA) Antibody, IgG: <0.2 AI (ref 0.0–0.9)
ds DNA Ab: <1 IU/mL (ref 0–9)

## 2016-10-12 LAB — ACID FAST SMEAR (AFB, MYCOBACTERIA): Acid Fast Smear: NEGATIVE

## 2016-10-12 LAB — ACID FAST SMEAR (AFB)

## 2016-10-13 LAB — CULTURE, BAL-QUANTITATIVE: CULTURE: NORMAL — AB

## 2016-10-13 LAB — ANCA TITERS: Atypical P-ANCA titer: <1:20 {titer}

## 2016-10-13 LAB — CYTOLOGY - NON PAP

## 2016-10-13 LAB — CULTURE, BAL-QUANTITATIVE W GRAM STAIN: Gram Stain: NONE SEEN

## 2016-10-17 ENCOUNTER — Encounter: Payer: Self-pay | Admitting: Gastroenterology

## 2016-10-17 ENCOUNTER — Ambulatory Visit (INDEPENDENT_AMBULATORY_CARE_PROVIDER_SITE_OTHER): Payer: Medicare Other | Admitting: Gastroenterology

## 2016-10-17 ENCOUNTER — Other Ambulatory Visit: Payer: Medicare Other

## 2016-10-17 VITALS — BP 130/100 | HR 100 | Ht 69.0 in | Wt 235.4 lb

## 2016-10-17 DIAGNOSIS — R05 Cough: Secondary | ICD-10-CM | POA: Diagnosis not present

## 2016-10-17 DIAGNOSIS — R053 Chronic cough: Secondary | ICD-10-CM

## 2016-10-17 DIAGNOSIS — R12 Heartburn: Secondary | ICD-10-CM

## 2016-10-17 DIAGNOSIS — R197 Diarrhea, unspecified: Secondary | ICD-10-CM | POA: Diagnosis not present

## 2016-10-17 MED ORDER — OMEPRAZOLE 40 MG PO CPDR: 40.0000 mg | DELAYED_RELEASE_CAPSULE | Freq: Every day | ORAL | 11 refills | Status: DC

## 2016-10-17 MED ORDER — BUDESONIDE 3 MG PO CPEP: 3.0000 mg | ORAL_CAPSULE | Freq: Every day | ORAL | 3 refills | Status: DC

## 2016-10-17 NOTE — Patient Instructions (Addendum)
You will have labs checked today in the basement lab.  Please head down after you check out with the front desk  (GI pathogen panel). Restart entocort 3mg  pills, 3 pills once daily. Disp 1 month with 3 refills.  Please call in 4 weeks to report on your response (to antiacid meds and also to the entocort) and if a good response will likely recommend you start tapering down at that point. Omeprazole prescription 40mg  pill, one pill 20-30 min before breakfast meal daily. Start ranitidine 150mg  pill, one pill at bedtime nightly (this is OTC).

## 2016-10-17 NOTE — Progress Notes (Signed)
Review of gastrointestinal problems: 1. Collagenous colitis, diagnosed by colonoscopy, biopsies randomly June 2011 Dr. Gustavo Lah.  Entocort, one pill a day is very effective.   HPI: This is a  very pleasant 71 year old woman  who was referred to me by Maryland Pink, MD  to evaluate  diarrhea .    Chief complaint is watery diarrhea, chronic cough  Has been having a lot of heart issues and has been battling a chronic cough for many years. Just had a bronchoscopy last week to determine cause of her chronic cough (4 years).  The cough is not particularly worse at night. She has Mild pyrosis under control with OTC PPI.  She has never tried increasing and he acid control to see if it improves her chronic cough. She has no dysphagia. She has no unintentional weight loss  Has had uncontrolable diarrhea intermittently for a long time, terrible for 6 weeks. The diarrhea is non-bloody.  She will have loose stools 4-5 daily, occasionally at night.   She has no abdominal pains  Has been on/off antibiotics a lot, for months.    Review of systems: Pertinent positive and negative review of systems were noted in the above HPI section. Complete review of systems was performed and was otherwise normal.   Past Medical History:  Diagnosis Date  . Anxiety   . Arthritis    osteoarthritis  . Asthma    due to seasonal alllergies  . Cataract   . Collagenous colitis 2010  . Depression    situational  . Dysrhythmia   . GERD (gastroesophageal reflux disease)   . Headache(784.0)    migraines; 2 x month  . Heart murmur    MVP ( symptomatic)  . History of IBS   . Hyperlipemia   . Hypertension    on medication x 10 years  . Shortness of breath    exertional  . Sleep apnea 2007   does not use CPAP since 40 # wt loss  . Syncopal episodes     Past Surgical History:  Procedure Laterality Date  . ABDOMINAL HYSTERECTOMY  1979  . BACK SURGERY    . CARDIAC CATHETERIZATION  2012   [facility]  .  CHOLECYSTECTOMY  2011  . FLEXIBLE BRONCHOSCOPY N/A 10/10/2016   Procedure: FLEXIBLE BRONCHOSCOPY;  Surgeon: [name], MD;  Location: [facility] ORS;  Service: Pulmonary;  Laterality: N/A;  . [place], 2012 x 2   bilateral  . LUMBAR LAMINECTOMY  [id number]  . NASAL SINUS SURGERY  1994  . TONSILLECTOMY    . TOTAL KNEE ARTHROPLASTY  12/22/2011   Procedure: TOTAL KNEE ARTHROPLASTY;  Surgeon: Rudean Haskell, MD;  Location: [facility];  Service: Orthopedics;  Laterality: Left;    Current Outpatient Prescriptions  Medication Sig Dispense Refill  . albuterol (PROVENTIL HFA;VENTOLIN HFA) 108 (90 BASE) MCG/ACT inhaler Inhale 1-2 puffs into the lungs every 6 (six) hours as needed for wheezing or shortness of breath. 1 Inhaler 3  . ALPRAZolam (XANAX) 0.5 MG tablet Take 0.5 mg by mouth at bedtime.     Marland Kitchen aspirin EC 81 MG tablet Take 81 mg by mouth daily.    . carboxymethylcellulose (REFRESH PLUS) 0.5 % SOLN Place 1 drop into both eyes 3 (three) times daily as needed (dry eyes).    . CARTIA XT 180 MG 24 hr capsule TAKE 1 CAPSULE(180 MG) BY MOUTH DAILY 90 capsule 3  . EPIPEN 2-PAK 0.3 MG/0.3ML SOAJ injection 0.3 mg as needed.     Marland Kitchen  furosemide (LASIX) 20 MG tablet Take 1 tablet (20 mg total) by mouth 2 (two) times daily as needed. (Patient taking differently: Take 20 mg by mouth daily. ) 180 tablet 3  . HYDROcodone-acetaminophen (NORCO) 10-325 MG tablet once a week.  0  . Ibuprofen-Diphenhydramine Cit (ADVIL PM) 200-38 MG TABS Take 3 tablets by mouth at bedtime.    . Melatonin 10 MG TABS Take 10 mg by mouth at bedtime.     Marland Kitchen omeprazole (PRILOSEC OTC) 20 MG tablet Take 20 mg by mouth daily.    . propranolol (INDERAL) 20 MG tablet TAKE 1 TABLET(20 MG) BY MOUTH THREE TIMES DAILY AS NEEDED (Patient taking differently: TAKE 1 TABLET(20 MG) BY MOUTH DAILY) 90 tablet 3  . sertraline (ZOLOFT) 100 MG tablet Take 150 mg by mouth daily.     . simvastatin (ZOCOR) 20 MG tablet TAKE 1 TABLET BY MOUTH EVERY  EVENING 90 tablet 3  . Vitamin D, Ergocalciferol, (DRISDOL) 50000 units CAPS capsule once a week.  11   No current facility-administered medications for this visit.     Allergies as of 10/17/2016 - Review Complete 10/17/2016  Allergen Reaction Noted  . Contrast media [iodinated diagnostic agents] Anaphylaxis 12/05/2011  . Hydroxychloroquine sulfate Hives 07/04/2010  . Banana Nausea And Vomiting 05/11/2015  . Nabumetone Other (See Comments)   . Oysters [shellfish allergy] Nausea And Vomiting 05/11/2015  . Sulfonamide derivatives Hives 07/04/2010  . Budesonide Rash   . Metronidazole Rash 07/04/2010    Family History  Problem Relation Age of Onset  . Hypertension Mother   . Hyperlipidemia Mother   . Diabetes Mother   . Hypertension Father   . Hyperlipidemia Father   . Arrhythmia Father     A-Fib  . Diabetes Paternal Uncle   . Diabetes Paternal Grandfather   . Anesthesia problems Neg Hx   . Colon cancer Neg Hx   . Stomach cancer Neg Hx   . Rectal cancer Neg Hx   . Liver cancer Neg Hx   . Esophageal cancer Neg Hx     Social History   Social History  . Marital status: Married    Spouse name: N/A  . Number of children: 2  . Years of education: N/A   Occupational History  . retired Pharmacist, hospital    Social History Main Topics  . Smoking status: Never Smoker  . Smokeless tobacco: Never Used  . Alcohol use 0.6 oz/week    1 Glasses of wine per week     Comment: occasional wine - less than once a month  . Drug use: No  . Sexual activity: No   Other Topics Concern  . Not on file   Social History Narrative  . No narrative on file     Physical Exam: BP (!) 130/100   Pulse 100   Ht _0  (1.753 m)   Wt 235 lb 6 oz (106.8 kg)   BMI 34.76 kg/m  Constitutional: generally well-appearing Psychiatric: alert and oriented x3 Eyes: extraocular movements intact Mouth: oral pharynx moist, no lesions Neck: supple no lymphadenopathy Cardiovascular: heart regular rate and  rhythm Lungs: clear to auscultation bilaterally Abdomen: soft, nontender, nondistended, no obvious ascites, no peritoneal signs, normal bowel sounds Extremities: no lower extremity edema bilaterally Skin: no lesions on visible extremities   Assessment and plan: 71 y.o. female with  Diarrhea, history of collagenous colitis, chronic cough, mild pyrosis  First I suspect her watery diarrhea is due to her collagenous colitis that has been well  established and usually very responsive to Entocort. She has been on and off antibiotics a lot over the past several months in several years and so I would like to check her for current: Infection with a GI pathogen panel. She will also start Entocort 3 mg pills, she will take 3 pills once daily and she will call to report on her response in 4 weeks. If she has a favorable response then I will have her start to taper off of the Entocort. I mentioned to her that her chronic cough might be in part due to GERD. She only has mild classic GERD symptoms of pyrosis however. These are under very good control with over-the-counter strength proton pump inhibitor once daily. I recommended a trial of increased antiacid control to see if it helps improve her chronic cough. This can take months. For now she will start prescription strength omeprazole 40 mg shortly before breakfast and she will also take H2 blocker ranitidine 150 mg at bedtime every night.   Owens Loffler, MD [facility] 10/17/2016, 10:00 AM  Cc: Maryland Pink, MD

## 2016-10-20 ENCOUNTER — Telehealth: Payer: Self-pay | Admitting: *Deleted

## 2016-10-20 ENCOUNTER — Encounter: Payer: Self-pay | Admitting: Pulmonary Disease

## 2016-10-20 ENCOUNTER — Other Ambulatory Visit: Payer: Medicare Other

## 2016-10-20 DIAGNOSIS — R197 Diarrhea, unspecified: Secondary | ICD-10-CM

## 2016-10-20 NOTE — Telephone Encounter (Signed)
Pt is sending an email asking about her results. She doesn't see you until Feb. 2018. Please advise.

## 2016-10-21 LAB — GASTROINTESTINAL PATHOGEN PANEL PCR
C. DIFFICILE TOX A/B, PCR: NOT DETECTED
Campylobacter, PCR: NOT DETECTED
Cryptosporidium, PCR: NOT DETECTED
E COLI (STEC) STX1/STX2, PCR: NOT DETECTED
E COLI 0157, PCR: NOT DETECTED
E coli (ETEC) LT/ST PCR: NOT DETECTED
Giardia lamblia, PCR: NOT DETECTED
Norovirus, PCR: NOT DETECTED
Rotavirus A, PCR: NOT DETECTED
Salmonella, PCR: NOT DETECTED
Shigella, PCR: NOT DETECTED

## 2016-10-30 ENCOUNTER — Telehealth: Payer: Self-pay | Admitting: Pulmonary Disease

## 2016-10-30 ENCOUNTER — Telehealth: Payer: Self-pay | Admitting: Gastroenterology

## 2016-10-30 NOTE — Telephone Encounter (Signed)
I apologized to pt for DS and informed her of his response. Pt states thanks for the apology and thanks for letting her know about the results.

## 2016-10-30 NOTE — Telephone Encounter (Signed)
Please advise on results

## 2016-10-30 NOTE — Telephone Encounter (Signed)
Patient's husband is very upset that they have not been contacted with the results!

## 2016-10-30 NOTE — Telephone Encounter (Signed)
Please call patient with biopsy results

## 2016-10-30 NOTE — Telephone Encounter (Signed)
No answer and no voice mail.

## 2016-10-30 NOTE — Telephone Encounter (Signed)
Apologize to her and explain that I have been out of town. All of the bronchoscopy results and the blood tests are normal  Thanks  Waunita Schooner

## 2016-11-06 NOTE — Telephone Encounter (Signed)
Pt has been notified and will call in 1-2 weeks to report on symptoms

## 2016-11-06 NOTE — Telephone Encounter (Signed)
Ok, lets try imodium then.  Two OTC pills every morning shortly after waking, repeat 3-4 hours later if needed.  Call in 1-2 weeks to report on her response.  If not better, will try mesalamine  thanks

## 2016-11-06 NOTE — Telephone Encounter (Signed)
Pt prescribed Entocort 3 mg daily. She took it for 3 days and woke up every night with a pounding heart and rash.  She continues to have 2-3  Diarrhea episodes daily.  Please advise.  I have added Entocort to her allergy list.

## 2016-11-11 ENCOUNTER — Ambulatory Visit (INDEPENDENT_AMBULATORY_CARE_PROVIDER_SITE_OTHER): Payer: Medicare Other | Admitting: Cardiovascular Disease

## 2016-11-11 ENCOUNTER — Encounter: Payer: Self-pay | Admitting: Cardiovascular Disease

## 2016-11-11 VITALS — BP 100/78 | HR 67 | Ht 68.0 in | Wt 234.5 lb

## 2016-11-11 DIAGNOSIS — I712 Thoracic aortic aneurysm, without rupture: Secondary | ICD-10-CM

## 2016-11-11 DIAGNOSIS — I10 Essential (primary) hypertension: Secondary | ICD-10-CM

## 2016-11-11 DIAGNOSIS — I2584 Coronary atherosclerosis due to calcified coronary lesion: Secondary | ICD-10-CM

## 2016-11-11 DIAGNOSIS — R0600 Dyspnea, unspecified: Secondary | ICD-10-CM | POA: Diagnosis not present

## 2016-11-11 DIAGNOSIS — R053 Chronic cough: Secondary | ICD-10-CM

## 2016-11-11 DIAGNOSIS — R05 Cough: Secondary | ICD-10-CM

## 2016-11-11 DIAGNOSIS — I2510 Atherosclerotic heart disease of native coronary artery without angina pectoris: Secondary | ICD-10-CM | POA: Diagnosis not present

## 2016-11-11 DIAGNOSIS — E782 Mixed hyperlipidemia: Secondary | ICD-10-CM

## 2016-11-11 DIAGNOSIS — I7121 Aneurysm of the ascending aorta, without rupture: Secondary | ICD-10-CM

## 2016-11-11 MED ORDER — BENZONATATE 100 MG PO CAPS: 100.0000 mg | ORAL_CAPSULE | Freq: Three times a day (TID) | ORAL | 1 refills | Status: DC | PRN

## 2016-11-11 MED ORDER — ROSUVASTATIN CALCIUM 40 MG PO TABS: 40.0000 mg | ORAL_TABLET | Freq: Every day | ORAL | 3 refills | Status: DC

## 2016-11-11 NOTE — Progress Notes (Signed)
Cardiology Office Note  Date:  11/11/2016   ID:  Caitlin SWEANEY, DOB 1946/05/10, MRN [id number]  PCP:  Maryland Pink, MD   Chief Complaint  Patient presents with  . OTHER    6 month f/u c/o sob and lightheadedness. Meds reviewed verbally with pt.    HPI:  Caitlin Leonard is a 71 year old woman with history of obesity, chronic cough, chronic exertional dyspnea, who presents for routine follow-up of her cough and shortness of breath, Coronary artery disease  seen by pulmonary for her exertional shortness of breath and chronic cough. She has 4 cm ascending aorta on CT scan, three-vessel coronary calcification on 02/2015 Cardiac catheterization November 2015 with no significant coronary artery disease Most recent CT scan February 2017 documenting ascending aorta Normal echocardiogram 2015  In follow-up she continues to have problems with chronic severe cough, worse at nighttime Bronchospastic in nature  She had a bronchoscopy 10/10/2016 Cultures negative  No improvement in cough with GERD Rx Taking imodium for diarrhea Now constipated.  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 Currently takes Lasix as needed  She has rare brief episodes of chest pain described as a lightening bolt through her chest up into her neck, typically not  associated with exertion  Orthostatics done in the office today show blood pressure 140/104 supine, 100/70 with sitting  felt dizzy  EKG on today's visit shows normal sinus rhythm with rate 66 bpm, nonspecific ST and T wave abnormality  Other past medical history Previous Review of CT scan  showing ascending aorta aneurysm Mild coronary artery disease This was seen previously on CT scan May 2016 She is followed by pulmonary for chronic cough  hemoglobin A1c typically in the 6 range, improved in the past with weight loss and exercise  She did complete a exercise VO2 max study with suggestion she had a circulatory issue. She has completed pulmonary rehabilitation, now in [facility]  Lost both of her parents in 2016, significant stress  Previously seen by Loralie Champagne as well as cardiology through [facility] for her symptoms. Symptoms of shortness of breath for more than 6 months, very limiting. No regular exercise as she feels unable to ambulate. Also reports having tachycardia with minimal exertion. Periods of near syncope at times, occasional chest pain  PFTs July 2015 which were normal, chest CT scan May 2015, also again not showing interstitial lung disease Echocardiogram July 2015 showing mildly elevated right heart pressures, right ventricular systolic pressure of 40 Right and left heart catheterizations November 2015 showing no significant CAD, filling pressures were not elevated Possible sleep apnea Holter monitor has been performed showing APCs and PVCs She has been tried on Lasix daily but felt dehydrated Continues on Advair  Includes carotid ultrasounds from August 2010 showing minimal carotid plaquing  Staged symptom limited exercise treadmill testing implies that she has mild to moderate functional limitation, diastolic dysfunction  suggested  PMH:   has a past medical history of Anxiety; Arthritis; Asthma; Cataract; Collagenous colitis (2010); Depression;  Dysrhythmia; GERD (gastroesophageal reflux disease); Headache(784.0); Heart murmur; History of IBS; Hyperlipemia; Hypertension; Shortness of breath; Sleep apnea (2007); and Syncopal episodes.  PSH:    Past Surgical History:  Procedure Laterality Date  . ABDOMINAL HYSTERECTOMY  1979  . BACK SURGERY    . CARDIAC CATHETERIZATION  2012   [facility]  . CHOLECYSTECTOMY  2011  . FLEXIBLE BRONCHOSCOPY N/A 10/10/2016   Procedure: FLEXIBLE BRONCHOSCOPY;  Surgeon: [name], MD;  Location: [facility] ORS;  Service: Pulmonary;  Laterality: N/A;  . [place], 2012 x 2   bilateral  . LUMBAR LAMINECTOMY  [id number]  . NASAL SINUS SURGERY  1994  . TONSILLECTOMY    . TOTAL KNEE ARTHROPLASTY  12/22/2011   Procedure: TOTAL KNEE ARTHROPLASTY;  Surgeon: Rudean Haskell, MD;  Location: [facility];  Service: Orthopedics;  Laterality: Left;    Current Outpatient Prescriptions  Medication Sig Dispense Refill  . albuterol (PROVENTIL HFA;VENTOLIN HFA) 108 (90 BASE) MCG/ACT inhaler Inhale 1-2 puffs into the lungs every 6 (six) hours as needed for wheezing or shortness of breath. 1 Inhaler 3  . ALPRAZolam (XANAX) 0.5 MG tablet Take 0.5 mg by mouth at bedtime.     Marland Kitchen aspirin EC 81 MG tablet Take 81 mg by mouth daily.    . carboxymethylcellulose (REFRESH PLUS) 0.5 % SOLN Place 1 drop into both eyes 3 (three) times daily as needed (dry eyes).    . CARTIA XT 180 MG 24 hr capsule TAKE 1 CAPSULE(180 MG) BY MOUTH DAILY 90 capsule 3  . EPIPEN 2-PAK 0.3 MG/0.3ML SOAJ injection 0.3 mg as needed.     . furosemide (LASIX) 20 MG tablet Take 1 tablet (20 mg total) by mouth 2 (two) times daily as needed. (Patient taking differently: Take 20 mg by mouth daily. ) 180 tablet 3  . HYDROcodone-acetaminophen (NORCO) 10-325 MG tablet once a week.  0  . Ibuprofen-Diphenhydramine Cit (ADVIL PM) 200-38 MG TABS Take 3 tablets by mouth at bedtime.    Marland Kitchen omeprazole (PRILOSEC OTC) 20 MG tablet Take 20 mg by mouth daily.    Marland Kitchen omeprazole  (PRILOSEC) 40 MG capsule Take 1 capsule (40 mg total) by mouth daily. 30 capsule 11  . propranolol (INDERAL) 20 MG tablet TAKE 1 TABLET(20 MG) BY MOUTH THREE TIMES DAILY AS NEEDED (Patient taking differently: TAKE 1 TABLET(20 MG) BY MOUTH DAILY) 90 tablet 3  . sertraline (ZOLOFT) 100 MG tablet Take 150 mg by mouth daily.     . Vitamin D, Ergocalciferol, (DRISDOL) 50000 units CAPS capsule once a week.  11  . benzonatate (TESSALON PERLES) 100 MG capsule Take 1 capsule (100 mg total) by mouth 3 (three) times daily as needed for cough. 90 capsule 1  . rosuvastatin (CRESTOR) 40 MG tablet Take 1 tablet (40 mg total) by mouth daily. 90 tablet 3   No current facility-administered medications for this visit.      Allergies:   Contrast media [iodinated diagnostic agents]; Entocort ec [budesonide]; Hydroxychloroquine sulfate; Banana; Nabumetone; Oysters [shellfish allergy]; Sulfonamide derivatives; and Metronidazole   Social History:  The patient  reports that she has never smoked. She has never used smokeless tobacco. She reports that she does not drink alcohol or use drugs.   Family History:   family history includes Arrhythmia in her father; Diabetes in her mother, paternal grandfather, and paternal uncle; Hyperlipidemia in her  father and mother; Hypertension in her father and mother.    Review of Systems: Review of Systems  Constitutional: Negative.   Respiratory: Positive for cough.        Bronchospastic cough  Cardiovascular: Negative.   Gastrointestinal: Negative.   Musculoskeletal: Negative.   Neurological: Negative.   Psychiatric/Behavioral: Negative.   All other systems reviewed and are negative.    PHYSICAL EXAM: VS:  BP 100/78 (BP Location: Left Arm, Patient Position: Sitting, Cuff Size: Large)   Pulse 67   Ht _0  (1.727 m)   Wt 234 lb 8 oz (106.4 kg)   BMI 35.66 kg/m  , BMI Body mass index is 35.66 kg/m. GEN: Well nourished, well developed, in no acute distress, obese   HEENT: normal  Neck: no JVD, carotid bruits, or masses Cardiac: RRR; no murmurs, rubs, or gallops, nonpitting leg swelling  Respiratory:  clear to auscultation bilaterally, normal work of breathing GI: soft, nontender, nondistended, + BS MS: no deformity or atrophy  Skin: warm and dry, no rash Neuro:  Strength and sensation are intact Psych: euthymic mood, full affect    Recent Labs: 06/04/2016: BUN 23; Creatinine, Ser 0.89; Hemoglobin 13.8; Platelets 293; Potassium 4.1; Sodium 128    Lipid Panel No results found for: CHOL, HDL, LDLCALC, TRIG    Wt Readings from Last 3 Encounters:  11/11/16 234 lb 8 oz (106.4 kg)  10/17/16 235 lb 6 oz (106.8 kg)  10/10/16 235 lb (106.6 kg)       ASSESSMENT AND PLAN:  Coronary artery calcification - Plan: EKG 12-Lead Three-vessel coronary calcifications on CT scan Recommended we be more aggressive with her lipid management to achieve LDL less than 70  Aneurysm, ascending aorta ([place]) - Plan: EKG 12-Lead Consider repeat echocardiogram or CT scan in 2019 Previously 4 cm by CT  Essential hypertension - Plan: EKG 12-Lead Blood pressure is well controlled on today's visit. No changes made to the medications.  Dyspnea, unspecified type - Plan: EKG 12-Lead Chronic shortness of breath likely secondary to obesity, deconditioning 4 acute episodes could try albuterol, Lasix  Chronic cough Suggested she try Tessalon Perles for bronchospastic cough If this does not work, perhaps could try tramadol  Morbid obesity ([place]) We have encouraged continued exercise, careful diet management in an effort to lose weight.  Mixed hyperlipidemia We will stop her simvastatin, start Crestor 40 mg daily   Total encounter time more than 25 minutes  Greater than 50% was spent in counseling and coordination of care with the patient   Disposition:   F/U  12 months   Orders Placed This Encounter  Procedures  . EKG 12-Lead     Signed, Esmond Plants, M.D.,  Ph.D. 11/11/2016  [place], [place]

## 2016-11-11 NOTE — Patient Instructions (Addendum)
Medication Instructions:   Please stop the simvastatin Start crestor 40 mg daily  Take tessalon pearls as needed 1 to 2 for cough  Ask Dr. Alva Garnet or Kary Kos about: tramadol for cough   Labwork:  No new labs needed  Testing/Procedures:  No further testing at this time   I recommend watching educational videos on topics of interest to you at:       [email or web address]  Enter code: [email or web address]    Follow-Up: It was a pleasure seeing you in the office today. Please call us if you have new issues that need to be addressed before your next appt.  [phone]  Your physician wants you to follow-up in: 12 months.  You will receive a reminder letter in the mail two months in advance. If you don't receive a letter, please call our office to schedule the follow-up appointment.  If you need a refill on your cardiac medications before your next appointment, please call your pharmacy.

## 2016-11-16 ENCOUNTER — Other Ambulatory Visit: Payer: Self-pay | Admitting: Cardiovascular Disease

## 2016-11-18 ENCOUNTER — Ambulatory Visit: Payer: Medicare Other | Admitting: Internal Medicine

## 2016-11-18 ENCOUNTER — Ambulatory Visit: Payer: Medicare Other | Admitting: Pulmonary Disease

## 2016-11-21 ENCOUNTER — Telehealth: Payer: Self-pay | Admitting: Cardiovascular Disease

## 2016-11-21 ENCOUNTER — Ambulatory Visit (INDEPENDENT_AMBULATORY_CARE_PROVIDER_SITE_OTHER): Payer: Medicare Other | Admitting: Pulmonary Disease

## 2016-11-21 ENCOUNTER — Encounter: Payer: Self-pay | Admitting: *Deleted

## 2016-11-21 ENCOUNTER — Encounter: Payer: Self-pay | Admitting: Pulmonary Disease

## 2016-11-21 VITALS — BP 128/72 | HR 86 | Wt 238.0 lb

## 2016-11-21 DIAGNOSIS — R053 Chronic cough: Secondary | ICD-10-CM

## 2016-11-21 DIAGNOSIS — R05 Cough: Secondary | ICD-10-CM | POA: Diagnosis not present

## 2016-11-21 DIAGNOSIS — J410 Simple chronic bronchitis: Secondary | ICD-10-CM

## 2016-11-21 DIAGNOSIS — R058 Other specified cough: Secondary | ICD-10-CM

## 2016-11-21 MED ORDER — BECLOMETHASONE DIPROPIONATE 80 MCG/ACT IN AERS: 2.0000 | INHALATION_SPRAY | Freq: Two times a day (BID) | RESPIRATORY_TRACT | 10 refills | Status: DC

## 2016-11-21 MED ORDER — DEXTROMETHORPHAN-GUAIFENESIN 15-400 MG PO TABS: 1.0000 | ORAL_TABLET | Freq: Two times a day (BID) | ORAL | 10 refills | Status: DC

## 2016-11-21 MED ORDER — BENZONATATE 200 MG PO CAPS: 200.0000 mg | ORAL_CAPSULE | Freq: Three times a day (TID) | ORAL | 10 refills | Status: AC

## 2016-11-21 NOTE — Patient Instructions (Addendum)
1) Begin Qvar inhaler - 2 puffs twice a day 2) Tessalon Perles - take 1 tablet 3 times a day 3) Dextromethorphan - guaifenesin: one tablet twice a day 4) Follow up in 6 weeks

## 2016-11-21 NOTE — Telephone Encounter (Signed)
Pt was in to see Dr. Alva Garnet, and she states 2 Zocor and she is unable to take this. States she aches in her joints, which makes her unable to sleep, and then has to take more pain medication. States when she was on 1 Zocor, she did not have this problem. Please call.

## 2016-11-21 NOTE — Progress Notes (Signed)
PULMONARY OFFICE FOLLOW UP NOTE  Requesting MD/Service: Kary Kos Date of initial consultation: 09/09/16 Reason for consultation: Chronic cough  PT PROFILE: 71 y.o. F never smoker with chronic severe cough of > 4 yrs duration, previously evaluated by Dr Chase Caller and Dr Raul Del. She has also been evaluated by ENT Tami Ribas) previously. Despite multiple therapeutic attempts, there have been no interventions or therapies that have alleviated her cough  DATA: PFTs 04/17/14: No obstruction, normal lung volumes, minimal decrease in DLCO CT chest 02/20/15: Stable small pulmonary nodules compatible with a benign process HRCT 11/20/15: Normal CT sinuses 11/20/15: Normal Spirometry 09/09/16: No obstruction Bronchoscopy 10/10/16: nasal septal perforation, otherwise normal upper airway, normal cords, mild bronchitis, minimal clear mucus, no endobronchial masses or foreign bodies. BAL grew < 10k NOF  SUBJ: Returns today for routine re-evaluation. She continues to have persistent and sometimes debilitating cough which remains deep and rattling but minimally productive. She denies CP, fever, purulent sputum, hemoptysis, LE edema and calf tenderness.   OBJ: Vitals:   11/21/16 0909 11/21/16 0910  BP:  128/72  Pulse:  86  SpO2:  93%  Weight: 238 lb (108 kg)      EXAM: Gen: Obese, freq coughing, No respiratory distress HEENT: ear canals and TMs normal, nasal septum perforation, oropharynx normal Neck: Supple without LAN, thyromegaly, JVD Lungs: breath sounds slightly coarse, no wheezes Cardiovascular: Reg, no murmurs noted Abdomen: Soft, nontender, normal BS Ext: without clubbing, cyanosis, edema Neuro: CNs grossly intact, motor and sensory intact Skin: Limited exam, no lesions noted  DATA:   BMP Latest Ref Rng & Units 06/04/2016 12/30/2014 10/17/2014  Glucose 65 - 99 mg/dL 119(H) 167(H) 140(H)  BUN 6 - 20 mg/dL 23(H) 7 12  Creatinine 0.44 - 1.00 mg/dL 0.89 0.65 0.72  Sodium 135 - 145 mmol/L  128(L) 134(L) 136  Potassium 3.5 - 5.1 mmol/L 4.1 3.2(L) 4.7  Chloride 101 - 111 mmol/L 95(L) 101 105  CO2 22 - 32 mmol/L _0 Calcium 8.9 - 10.3 mg/dL 9.4 8.9 9.7    CBC Latest Ref Rng & Units 06/04/2016 12/30/2014 10/17/2014  WBC 3.6 - 11.0 K/uL 11.9(H) 7.6 9.5  Hemoglobin 12.0 - 16.0 g/dL 13.8 10.7(L) 13.3  Hematocrit 35.0 - 47.0 % 39.6 33.2(L) 40.5  Platelets 150 - 440 K/uL 293 280 274    CXR:  NNF  IMPRESSION:   1) Prolonged chronic severe cough refractory to all empiric attempts @ therapy for sinusitis, GERD, asthmatic bronchitis 2) 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 She is presently treated for rhinosinusitis and GERD. I have reviewed the cough monograph on Up to date and it does not appear that we have missed any steps in the proposed algorithm. Suspect now that there is a cyclical, self-perpetuating component  PLAN:  1) Begin ICS - Qvar 80 mcg strength - 2 inhalations BID 2) Scheduled cough suppression with dextromethorphan and scheduled benzonatate (Tessalon) 3) Continue antihistamine and PPI 4) ROV 6 weeks   Merton Border, MD PCCM service Mobile [phone] Pager [phone] 11/21/2016

## 2016-11-21 NOTE — Telephone Encounter (Signed)
Spoke with patient and she stated that she was told to take two tablets of zocor a day and that since doing that her body aches have been terrible. Reviewed with her previous office visit information and instructions were to discontinue the zocor and start on the crestor. Patient verbalized agreement with discontinuing the zocor and starting on the crestor. Instructed her to call back if she has any problems and she verbalized understanding with no further questions at this time.

## 2016-11-27 ENCOUNTER — Telehealth: Payer: Self-pay | Admitting: *Deleted

## 2016-11-27 NOTE — Telephone Encounter (Signed)
Submitted PA for Qvar thru [facility]. [place] Haggard [id number]   Will await response.

## 2016-11-28 ENCOUNTER — Ambulatory Visit: Payer: Medicare Other | Admitting: Pulmonary Disease

## 2016-12-03 MED ORDER — MOMETASONE FUROATE 220 MCG/INH IN AEPB: 1.0000 | INHALATION_SPRAY | Freq: Every day | RESPIRATORY_TRACT | 10 refills | Status: DC

## 2016-12-03 NOTE — Telephone Encounter (Signed)
Qvar denied.  Request Reference Number: [id number]. QVAR AER 80 MCG is denied for not meeting the prior authorization requirement(s).   Not alternatives given. Please advise.

## 2016-12-03 NOTE — Telephone Encounter (Signed)
I have sent an order for asmanex. We'll just keep playing this game until her insurance payer informs [place] which ICS is covered  Caitlin Leonard

## 2016-12-03 NOTE — Telephone Encounter (Signed)
Pt informed. Nothing further needed. 

## 2016-12-05 ENCOUNTER — Telehealth: Payer: Self-pay | Admitting: *Deleted

## 2016-12-05 NOTE — Telephone Encounter (Signed)
Submitted PA for Asmanex thru [facility]MM. Key: [id number]  [id number]  Submitted for review. Will await response.

## 2016-12-08 ENCOUNTER — Other Ambulatory Visit: Payer: Self-pay | Admitting: Family Medicine

## 2016-12-08 DIAGNOSIS — Z1231 Encounter for screening mammogram for malignant neoplasm of breast: Secondary | ICD-10-CM

## 2016-12-09 ENCOUNTER — Telehealth: Payer: Self-pay | Admitting: Pulmonary Disease

## 2016-12-09 DIAGNOSIS — R000 Tachycardia, unspecified: Secondary | ICD-10-CM

## 2016-12-09 NOTE — Telephone Encounter (Signed)
Asmanex has been denied by patients insurance. Please advise.

## 2016-12-09 NOTE — Telephone Encounter (Signed)
Spoke w/ pt.  She reports that her HR and BP go up when she exercises and she is concerned that it is going too high.  She reports that her HR will reach peak for 75-95% of her exercise time and will "drop like a rock". Pt is doing aerobic exercise on her recumbent elliptical for 35 minutes. Advised pt that increased HR and BP are a normal response to exercise and that a fast recovery is good. She states that her HR at rest is normal. After exercise she is usually dizzy and lightheaded. She states that her husband is a Teacher, early years/pre and he will not let her continue to exercise if her HR is that high, as it is typically in the 60s at home. Reiterated to pt that this is a normal response to exercise and if she is concerned, she may need to change the type of exercise that she is doing.  She states that is not correct and that Dr. Rockey Situ needs to be made aware and would like a call back w/ his recommendation.  Advised her that I will make him aware and call her back.

## 2016-12-09 NOTE — Telephone Encounter (Signed)
Alternatives for Asmanex was sent by insurance which are Arnuity, Flovent Diskus or Flovent HFA. Please advise.

## 2016-12-09 NOTE — Telephone Encounter (Signed)
Holter could be ordered if she feels she is having inappropriate heart rate response She would keep a diary when she is exercising and we would call her with results

## 2016-12-09 NOTE — Telephone Encounter (Signed)
Pt calling stating she went to her Pediatrist and her BP was 157/100 resting 108 She is calling to se what advise we have for her to help with that Please advise.

## 2016-12-10 LAB — ACID FAST CULTURE WITH REFLEXED SENSITIVITIES: ACID FAST CULTURE - AFSCU3: NEGATIVE

## 2016-12-10 MED ORDER — FLUTICASONE PROPIONATE HFA 110 MCG/ACT IN AERO: 2.0000 | INHALATION_SPRAY | Freq: Two times a day (BID) | RESPIRATORY_TRACT | 10 refills | Status: DC

## 2016-12-10 NOTE — Telephone Encounter (Signed)
Spoke with patient and reviewed Dr. Donivan Scull recommendations. She states that she does not want to wear another monitor since she has worn several in the past. She states that something is wrong and she wants to know what we can do about it. She states that during the last appointment that they mentioned trying some other stuff and she wants to move forward with that and does not want to wear a monitor. She states that it is not normal for her heart rate to be in the 160's during exercise. Offered her appointment with Dr. Rockey Situ and she was agreeable to that. She verbalized understanding of our conversation with no further questions at this time.

## 2016-12-10 NOTE — Telephone Encounter (Signed)
If she does not want a Holter where I can watch her exercise, The best thing would actually be a routine treadmill study which she could do in the office I cannot help her if I cannot see her heart rate go up and down with exercise On a treadmill, we can see her at resting heart rate, determine if there is inappropriate tachycardia with exertion and what her recovery is I can review the treadmill when it is complete, call her with the results

## 2016-12-10 NOTE — Telephone Encounter (Signed)
Flovent HFA order sent to pharmacy  Caitlin Leonard

## 2016-12-10 NOTE — Telephone Encounter (Signed)
Pt informed. Nothing further needed. 

## 2016-12-11 NOTE — Addendum Note (Signed)
Addended by: Valora Corporal on: 12/11/2016 08:26 AM   Modules accepted: Orders

## 2016-12-11 NOTE — Telephone Encounter (Signed)
Spoke with patient in detail about the need to know what rhythm she is in when her heart rate is that fast. We need this information so that we can know how to proceed with treatment because there are numerous options based on rhythm. Advised her that we could order a monitor or stress test and so that we can see what her heart does during exercise. She was agreeable to wear a 2 week Zio monitor and that we would cancel her appointment tomorrow and then we would call her once we have results. She verbalized understanding of our conversation, agreement with plan, and had no further questions at this time. Scheduled her to come in tomorrow at 11:45AM and I will be happy to place the monitor on her. She was agreeable to the time and date. Let her know that I would see her tomorrow.

## 2016-12-12 ENCOUNTER — Ambulatory Visit (INDEPENDENT_AMBULATORY_CARE_PROVIDER_SITE_OTHER): Payer: Medicare Other

## 2016-12-12 ENCOUNTER — Ambulatory Visit: Payer: Medicare Other | Admitting: Cardiovascular Disease

## 2016-12-12 DIAGNOSIS — R000 Tachycardia, unspecified: Secondary | ICD-10-CM

## 2016-12-15 ENCOUNTER — Telehealth: Payer: Self-pay | Admitting: Cardiovascular Disease

## 2016-12-15 NOTE — Telephone Encounter (Signed)
S/w patient. She was exercising on an eliptical and fell. She has not been to the doctor yet. Patient has ZIO monitor. Advised patient to continue wearing the monitor until she was told to remove it and send it back. She verbalized understanding.

## 2016-12-15 NOTE — Telephone Encounter (Signed)
Patient wearing heart monitor (2 wk) starting Friday 12/12/16.  She fell on Saturday and has a bad sprain on one knee and bruised the other knee. Should she continue the heart monitor due to inactivity? Please call patient.

## 2016-12-16 ENCOUNTER — Ambulatory Visit: Payer: Medicare Other | Admitting: Pulmonary Disease

## 2016-12-16 ENCOUNTER — Other Ambulatory Visit: Payer: Self-pay | Admitting: Cardiovascular Disease

## 2016-12-27 ENCOUNTER — Other Ambulatory Visit (HOSPITAL_COMMUNITY): Payer: Self-pay | Admitting: Cardiology

## 2016-12-29 NOTE — Telephone Encounter (Signed)
NVM! I see pt was switched to crestor. Thanks!

## 2016-12-29 NOTE — Telephone Encounter (Signed)
Please advise if ok to refill as Rockey Situ is not original prescriber. Thanks!

## 2017-01-01 ENCOUNTER — Other Ambulatory Visit: Payer: Self-pay | Admitting: Cardiovascular Disease

## 2017-01-01 DIAGNOSIS — R2689 Other abnormalities of gait and mobility: Secondary | ICD-10-CM | POA: Insufficient documentation

## 2017-01-01 DIAGNOSIS — I480 Paroxysmal atrial fibrillation: Secondary | ICD-10-CM | POA: Diagnosis present

## 2017-01-01 DIAGNOSIS — Z8659 Personal history of other mental and behavioral disorders: Secondary | ICD-10-CM | POA: Insufficient documentation

## 2017-01-01 DIAGNOSIS — I6381 Other cerebral infarction due to occlusion or stenosis of small artery: Secondary | ICD-10-CM | POA: Insufficient documentation

## 2017-01-04 ENCOUNTER — Encounter: Payer: Self-pay | Admitting: Pulmonary Disease

## 2017-01-05 ENCOUNTER — Encounter: Payer: Self-pay | Admitting: *Deleted

## 2017-01-05 ENCOUNTER — Ambulatory Visit
Admission: RE | Admit: 2017-01-05 | Discharge: 2017-01-05 | Disposition: A | Discharge status: Home / Self Care | Payer: Medicare Other | Source: Ambulatory Visit | Attending: Family Medicine | Admitting: Family Medicine

## 2017-01-05 DIAGNOSIS — R928 Other abnormal and inconclusive findings on diagnostic imaging of breast: Secondary | ICD-10-CM | POA: Insufficient documentation

## 2017-01-05 DIAGNOSIS — Z1231 Encounter for screening mammogram for malignant neoplasm of breast: Secondary | ICD-10-CM | POA: Insufficient documentation

## 2017-01-05 DIAGNOSIS — N6320 Unspecified lump in the left breast, unspecified quadrant: Secondary | ICD-10-CM | POA: Diagnosis not present

## 2017-01-06 ENCOUNTER — Ambulatory Visit (INDEPENDENT_AMBULATORY_CARE_PROVIDER_SITE_OTHER): Payer: Medicare Other | Admitting: Pulmonary Disease

## 2017-01-06 ENCOUNTER — Other Ambulatory Visit: Payer: Self-pay | Admitting: Family Medicine

## 2017-01-06 ENCOUNTER — Ambulatory Visit
Admission: RE | Admit: 2017-01-06 | Discharge: 2017-01-06 | Disposition: A | Discharge status: Home / Self Care | Payer: Medicare Other | Source: Ambulatory Visit | Attending: Pulmonary Disease | Admitting: Pulmonary Disease

## 2017-01-06 ENCOUNTER — Encounter: Payer: Self-pay | Admitting: Pulmonary Disease

## 2017-01-06 VITALS — BP 118/72 | HR 98 | Wt 235.0 lb

## 2017-01-06 DIAGNOSIS — N6320 Unspecified lump in the left breast, unspecified quadrant: Secondary | ICD-10-CM

## 2017-01-06 DIAGNOSIS — R0609 Other forms of dyspnea: Secondary | ICD-10-CM | POA: Insufficient documentation

## 2017-01-06 DIAGNOSIS — J301 Allergic rhinitis due to pollen: Secondary | ICD-10-CM

## 2017-01-06 DIAGNOSIS — R059 Cough, unspecified: Secondary | ICD-10-CM

## 2017-01-06 DIAGNOSIS — R05 Cough: Secondary | ICD-10-CM | POA: Diagnosis not present

## 2017-01-06 DIAGNOSIS — R928 Other abnormal and inconclusive findings on diagnostic imaging of breast: Secondary | ICD-10-CM

## 2017-01-06 DIAGNOSIS — R053 Chronic cough: Secondary | ICD-10-CM

## 2017-01-06 MED ORDER — CETIRIZINE HCL 10 MG PO TABS: 10.0000 mg | ORAL_TABLET | Freq: Every day | ORAL | 11 refills | Status: DC

## 2017-01-06 MED ORDER — ALBUTEROL SULFATE HFA 108 (90 BASE) MCG/ACT IN AERS: 1.0000 | INHALATION_SPRAY | Freq: Four times a day (QID) | RESPIRATORY_TRACT | 3 refills | Status: DC | PRN

## 2017-01-06 NOTE — Patient Instructions (Addendum)
1) Chest Xray and office spirometry today 2) Zyrtec 10 mg each night during pollen season 3) Continue Prilosec (omerpazole), Symbicort 4) Continue Tessalon  And Delsym as needed 5) We will provide your name to Dr Lake Bells in [place] for consideration of enrollment in a study on chronic cough. You might be contacted by his office with further information regarding this 6) Follow up in 3 months

## 2017-01-06 NOTE — Progress Notes (Signed)
PULMONARY OFFICE FOLLOW UP NOTE  Requesting MD/Service: Kary Kos Date of initial consultation: 09/09/16 Reason for consultation: Chronic cough  PT PROFILE: 71 y.o. F never smoker with chronic severe cough of > 4 yrs duration, previously evaluated by Dr Chase Caller and Dr Raul Del. She has also been evaluated by ENT Tami Ribas) previously. Despite multiple therapeutic attempts, there have been no interventions or therapies that have alleviated her cough  DATA: PFTs 04/17/14: No obstruction, normal lung volumes, minimal decrease in DLCO CT chest 02/20/15: Stable small pulmonary nodules compatible with a benign process HRCT 11/20/15: Normal CT sinuses 11/20/15: Normal Spirometry 09/09/16: No obstruction Bronchoscopy 10/10/16: nasal septal perforation, otherwise normal upper airway, normal cords, mild bronchitis, minimal clear mucus, no endobronchial masses or foreign bodies. BAL grew < 10k NOF Spirometry 01/06/17: FVC:  2.75L ( 77%pred), FEV1:  2.14 L ( 79 %pred), FEV1/FVC: 78%   SUBJ: Returns today for routine re-evaluation. Since last visit, she reports that she had a three-week period in early March where she was not struggling with cough. However, since the pollens have become heavy, she is again struggling with cough and also reports shortness of breath, mostly noted during exertion. Her cough remains nonproductive. She denies chest pain pressure tightness or heaviness. She denies purulent sputum and hemoptysis. She has had no fevers. She is presently on Symbicort (as opposed to the Qvar that was prescribed last visit). Notably, the 3 week period in early March occurred shortly after starting the Symbicort.   OBJ: Vitals:   01/06/17 0858  BP: 118/72  Pulse: 98  SpO2: 94%  Weight: 235 lb (106.6 kg)  Room air   EXAM: Gen: Coughing occasionally, No respiratory distress HEENT: No acute findings Lungs: breath sounds slightly coarse, no wheezes Cardiovascular: Reg, no murmurs  noted Abdomen: Soft, nontender, normal BS Ext: without clubbing, cyanosis, edema Neuro: CNs grossly intact, motor and sensory intact   DATA:   BMP Latest Ref Rng & Units 06/04/2016 12/30/2014 10/17/2014  Glucose 65 - 99 mg/dL 119(H) 167(H) 140(H)  BUN 6 - 20 mg/dL 23(H) 7 12  Creatinine 0.44 - 1.00 mg/dL 0.89 0.65 0.72  Sodium 135 - 145 mmol/L 128(L) 134(L) 136  Potassium 3.5 - 5.1 mmol/L 4.1 3.2(L) 4.7  Chloride 101 - 111 mmol/L 95(L) 101 105  CO2 22 - 32 mmol/L _0 Calcium 8.9 - 10.3 mg/dL 9.4 8.9 9.7    CBC Latest Ref Rng & Units 06/04/2016 12/30/2014 10/17/2014  WBC 3.6 - 11.0 K/uL 11.9(H) 7.6 9.5  Hemoglobin 12.0 - 16.0 g/dL 13.8 10.7(L) 13.3  Hematocrit 35.0 - 47.0 % 39.6 33.2(L) 40.5  Platelets 150 - 440 K/uL 293 280 274    CXR:  NNF  IMPRESSION:   1) Chronic cough 2) acute seasonal allergic rhinitis due to pollen 3) exertional dyspnea  PLAN:  1) Chest Xray and office spirometry today  2) add Zyrtec 10 mg each night during pollen season 3) Continue Prilosec (omerpazole), Symbicort 4) Continue Tessalon  And Delsym as needed 5) Provided her name to Dr Lake Bells in [place] for consideration of enrollment in a study on chronic cough.  6) Follow up in 3 months   Merton Border, MD P[facility]M service Mobile [phone] Pager [phone] 01/06/2017   ADD:- The spirometry reveals no definite obstruction but notably, the FEV1 is decreased compared to prior spirometry. Chest x-ray today reveals no acute cardiac or pulmonary disease.  Merton Border, MD P[facility]M service Mobile [phone] Pager [phone] 01/07/2017

## 2017-01-13 ENCOUNTER — Telehealth: Payer: Self-pay | Admitting: Cardiovascular Disease

## 2017-01-13 NOTE — Telephone Encounter (Signed)
Results from monitor are in the message basket

## 2017-01-13 NOTE — Telephone Encounter (Signed)
Pt states she cannot take the Crestor, it has caused her sugar to go above 150. This morning it was 163. Pt would also like her holter results.

## 2017-01-14 NOTE — Telephone Encounter (Signed)
Reviewed results w/ pt.  She asks if Dr. Rockey Situ made any recommendations about Crestor. She states that her BG was fine before starting Crestor and now it is elevated, so she is certain that this is the culprit. She does not have an endocrinologist, per PCP follows her DM. She has not spoken w/ him about her concerns. Advised her to make him aware and discuss w/ PCP if he feels this is causing elevated BG and if he has a recommendation for a chol med that will not do this.  She is agreeable and will call back if we can be of further assistance.

## 2017-01-15 ENCOUNTER — Ambulatory Visit
Admission: RE | Admit: 2017-01-15 | Discharge: 2017-01-15 | Disposition: A | Discharge status: Home / Self Care | Payer: Medicare Other | Source: Ambulatory Visit | Attending: Family Medicine | Admitting: Family Medicine

## 2017-01-15 ENCOUNTER — Ambulatory Visit: Payer: Medicare Other | Attending: Neurology

## 2017-01-15 DIAGNOSIS — Z7982 Long term (current) use of aspirin: Secondary | ICD-10-CM | POA: Insufficient documentation

## 2017-01-15 DIAGNOSIS — R928 Other abnormal and inconclusive findings on diagnostic imaging of breast: Secondary | ICD-10-CM

## 2017-01-15 DIAGNOSIS — Z888 Allergy status to other drugs, medicaments and biological substances status: Secondary | ICD-10-CM | POA: Insufficient documentation

## 2017-01-15 DIAGNOSIS — G4761 Periodic limb movement disorder: Secondary | ICD-10-CM | POA: Insufficient documentation

## 2017-01-15 DIAGNOSIS — G4733 Obstructive sleep apnea (adult) (pediatric): Secondary | ICD-10-CM | POA: Insufficient documentation

## 2017-01-15 DIAGNOSIS — Z79899 Other long term (current) drug therapy: Secondary | ICD-10-CM | POA: Insufficient documentation

## 2017-01-15 DIAGNOSIS — I10 Essential (primary) hypertension: Secondary | ICD-10-CM | POA: Diagnosis not present

## 2017-01-15 DIAGNOSIS — N6320 Unspecified lump in the left breast, unspecified quadrant: Secondary | ICD-10-CM

## 2017-01-15 DIAGNOSIS — Z882 Allergy status to sulfonamides status: Secondary | ICD-10-CM | POA: Diagnosis not present

## 2017-01-15 DIAGNOSIS — N6002 Solitary cyst of left breast: Secondary | ICD-10-CM | POA: Insufficient documentation

## 2017-01-15 DIAGNOSIS — Z8673 Personal history of transient ischemic attack (TIA), and cerebral infarction without residual deficits: Secondary | ICD-10-CM | POA: Diagnosis not present

## 2017-01-15 DIAGNOSIS — I519 Heart disease, unspecified: Secondary | ICD-10-CM | POA: Insufficient documentation

## 2017-01-15 DIAGNOSIS — Z7951 Long term (current) use of inhaled steroids: Secondary | ICD-10-CM | POA: Diagnosis not present

## 2017-01-15 DIAGNOSIS — E119 Type 2 diabetes mellitus without complications: Secondary | ICD-10-CM | POA: Diagnosis not present

## 2017-01-15 DIAGNOSIS — Z91013 Allergy to seafood: Secondary | ICD-10-CM | POA: Diagnosis not present

## 2017-01-28 ENCOUNTER — Ambulatory Visit: Payer: Medicare Other | Attending: Neurology

## 2017-01-28 DIAGNOSIS — G4733 Obstructive sleep apnea (adult) (pediatric): Secondary | ICD-10-CM | POA: Diagnosis not present

## 2017-02-02 ENCOUNTER — Other Ambulatory Visit: Payer: Self-pay | Admitting: Family Medicine

## 2017-02-02 DIAGNOSIS — I712 Thoracic aortic aneurysm, without rupture: Secondary | ICD-10-CM

## 2017-02-02 DIAGNOSIS — I7121 Aneurysm of the ascending aorta, without rupture: Secondary | ICD-10-CM

## 2017-02-12 ENCOUNTER — Ambulatory Visit
Admission: RE | Admit: 2017-02-12 | Discharge: 2017-02-12 | Disposition: A | Discharge status: Home / Self Care | Payer: Medicare Other | Source: Ambulatory Visit | Attending: Family Medicine | Admitting: Family Medicine

## 2017-02-12 DIAGNOSIS — I700 Atherosclerosis of aorta: Secondary | ICD-10-CM | POA: Diagnosis not present

## 2017-02-12 DIAGNOSIS — I7121 Aneurysm of the ascending aorta, without rupture: Secondary | ICD-10-CM

## 2017-02-12 DIAGNOSIS — I712 Thoracic aortic aneurysm, without rupture: Secondary | ICD-10-CM | POA: Insufficient documentation

## 2017-02-12 DIAGNOSIS — I2510 Atherosclerotic heart disease of native coronary artery without angina pectoris: Secondary | ICD-10-CM | POA: Diagnosis not present

## 2017-02-24 ENCOUNTER — Encounter: Payer: Self-pay | Admitting: Cardiovascular Disease

## 2017-03-01 ENCOUNTER — Encounter: Payer: Self-pay | Admitting: Cardiovascular Disease

## 2017-03-12 ENCOUNTER — Other Ambulatory Visit: Payer: Self-pay | Admitting: Cardiovascular Disease

## 2017-04-13 ENCOUNTER — Ambulatory Visit (INDEPENDENT_AMBULATORY_CARE_PROVIDER_SITE_OTHER): Payer: Medicare Other | Admitting: Pulmonary Disease

## 2017-04-13 ENCOUNTER — Encounter: Payer: Self-pay | Admitting: Pulmonary Disease

## 2017-04-13 ENCOUNTER — Telehealth: Payer: Self-pay | Admitting: Pulmonary Disease

## 2017-04-13 ENCOUNTER — Ambulatory Visit (INDEPENDENT_AMBULATORY_CARE_PROVIDER_SITE_OTHER): Payer: Medicare Other

## 2017-04-13 ENCOUNTER — Other Ambulatory Visit: Payer: Self-pay | Admitting: *Deleted

## 2017-04-13 VITALS — BP 124/64 | HR 94 | Ht 68.0 in | Wt 240.0 lb

## 2017-04-13 DIAGNOSIS — R0602 Shortness of breath: Secondary | ICD-10-CM

## 2017-04-13 DIAGNOSIS — R05 Cough: Secondary | ICD-10-CM

## 2017-04-13 DIAGNOSIS — R0609 Other forms of dyspnea: Secondary | ICD-10-CM

## 2017-04-13 DIAGNOSIS — R053 Chronic cough: Secondary | ICD-10-CM

## 2017-04-13 DIAGNOSIS — R55 Syncope and collapse: Secondary | ICD-10-CM | POA: Diagnosis not present

## 2017-04-13 DIAGNOSIS — R002 Palpitations: Secondary | ICD-10-CM

## 2017-04-13 NOTE — Telephone Encounter (Signed)
14 day Zio monitor placed on patient today at her appointment with Dr. Shawna Orleans. Monitor number [id number] and instructions reviewed with her. She verbalized understanding with no further questions at this time.

## 2017-04-13 NOTE — Patient Instructions (Signed)
Continue Symbicort and albuterol as needed A 14 day heart monitor has been ordered - we will call you with the results of this test Follow-up with me in 4-6 months

## 2017-04-14 NOTE — Progress Notes (Signed)
PULMONARY OFFICE FOLLOW UP NOTE  Requesting MD/Service: Kary Kos Date of initial consultation: 09/09/16 Reason for consultation: Chronic cough  PT PROFILE: 71 y.o. F never smoker with chronic severe cough of > 4 yrs duration, previously evaluated by Dr Chase Caller and Dr Raul Del. She has also been evaluated by ENT Tami Ribas) previously. Despite multiple therapeutic attempts, there have been no interventions or therapies that have alleviated her cough  DATA: PFTs 04/17/14: No obstruction, normal lung volumes, minimal decrease in DLCO CT chest 02/20/15: Stable small pulmonary nodules compatible with a benign process HRCT 11/20/15: Normal CT sinuses 11/20/15: Normal Spirometry 09/09/16: No obstruction Bronchoscopy 10/10/16: nasal septal perforation, otherwise normal upper airway, normal cords, mild bronchitis, minimal clear mucus, no endobronchial masses or foreign bodies. BAL grew < 10k NOF Spirometry 01/06/17: FVC:  2.75L ( 77%pred), FEV1:  2.14 L ( 79 %pred), FEV1/FVC: 78% PSG 01/15/17: AHI 10/hr. REM AHI 48/hr CPAP titration 01/28/17: CPAP 12 cm H2O recommended  SUBJ: Routine re-evaluation. Since last visit, diagnosed with OSA. Her cough is much improved - she attributes this to bronchoscopy performed in January (doubtful). Since last visit, she has developed worsening DOE with associated tachycardia, palpitations and severe lightheadedness/pre-syncope. She recently traveled to [place] on  A guided trip and was embarrassed to be placed in the "frail and feeble" group. There is little day to day or moment to moment variation in these symptoms. She has previously undergone cardiology eval. She had an event monitor performed but notes that she was bedridden for 10 of the 14 days during that test and therefore does not believe that it was a useful test.   OBJ: Vitals:   04/13/17 0950 04/13/17 0951  BP:  124/64  Pulse:  94  SpO2:  98%  Weight: 240 lb (108.9 kg)   Height: _0  (1.727 m)   Room  air   EXAM: Gen: Coughing occasionally, No respiratory distress HEENT: No acute findings Lungs: breath sounds full, no wheezes Cardiovascular: Reg, no murmurs noted Abdomen: Soft, nontender, normal BS Ext: without clubbing, cyanosis, edema Neuro: CNs grossly intact, motor and sensory intact   DATA:   BMP Latest Ref Rng & Units 06/04/2016 12/30/2014 10/17/2014  Glucose 65 - 99 mg/dL 119(H) 167(H) 140(H)  BUN 6 - 20 mg/dL 23(H) 7 12  Creatinine 0.44 - 1.00 mg/dL 0.89 0.65 0.72  Sodium 135 - 145 mmol/L 128(L) 134(L) 136  Potassium 3.5 - 5.1 mmol/L 4.1 3.2(L) 4.7  Chloride 101 - 111 mmol/L 95(L) 101 105  CO2 22 - 32 mmol/L _1 Calcium 8.9 - 10.3 mg/dL 9.4 8.9 9.7    CBC Latest Ref Rng & Units 06/04/2016 12/30/2014 10/17/2014  WBC 3.6 - 11.0 K/uL 11.9(H) 7.6 9.5  Hemoglobin 12.0 - 16.0 g/dL 13.8 10.7(L) 13.3  Hematocrit 35.0 - 47.0 % 39.6 33.2(L) 40.5  Platelets 150 - 440 K/uL 293 280 274    CXR 01/06/17:  NACPD  IMPRESSION:   1) Chronic cough - much improved 2) Exertional dyspnea with associated palpitations and presyncoe  PLAN:  Continue Symbicort and albuterol as needed Discussed with Dr Rockey Situ - a repeat 14 day heart monitor has been ordered. We will contact her when results are available Follow-up with me in 4-6 months   Merton Border, MD PCCM service Mobile [phone] Pager [phone] 04/14/2017

## 2017-05-04 ENCOUNTER — Encounter: Payer: Self-pay | Admitting: Gastroenterology

## 2017-05-04 MED ORDER — BUDESONIDE 3 MG PO CPEP: 3.0000 mg | ORAL_CAPSULE | Freq: Every day | ORAL | 3 refills | Status: DC

## 2017-05-04 NOTE — Telephone Encounter (Signed)
Message   [place],  I spoke with her on phone. Would like her to retry entocort (wasn't really an allergic reaction in past, just heart racing when on 3 pills per day). Can you call in one pill once daily, disp 30 with 3 refills and she needs ROV with me in 4-6 weeks to see how she is doing. Thanks    dj

## 2017-05-14 ENCOUNTER — Other Ambulatory Visit: Payer: Self-pay | Admitting: Cardiovascular Disease

## 2017-06-02 ENCOUNTER — Ambulatory Visit (INDEPENDENT_AMBULATORY_CARE_PROVIDER_SITE_OTHER): Payer: Medicare Other | Admitting: Gastroenterology

## 2017-06-02 ENCOUNTER — Encounter: Payer: Self-pay | Admitting: Gastroenterology

## 2017-06-02 VITALS — BP 132/86 | HR 76 | Ht 68.0 in | Wt 238.0 lb

## 2017-06-02 DIAGNOSIS — K52831 Collagenous colitis: Secondary | ICD-10-CM

## 2017-06-02 NOTE — Progress Notes (Signed)
Review of gastrointestinal problems: 1. Collagenous colitis, diagnosed by colonoscopy, biopsies randomly June 2011 Dr. Gustavo Lah. Entocort, one pill a day is very effective. Recurrence of symptoms 10/2016; GI pathogen panel negative; entocort 3 pills daily started but stopped due to heart racing. Imodium didn't seem to help very well and so she started Entocort at one pill once daily July 2018 with very good response infection became somewhat constipated.     HPI: This is a very pleasant Caitlin Leonard whom I last saw several months ago.  Chief complaint is collagenous colitis  She's taking entocort once daily.    She has been on this regimen for about a month and a half and is actually a bit constipated. She noticed dramatic quick response very soon after starting the one pill once daily. She has had to rescue her constipation with laxatives 2 or 3 times in the past month.  No fevers or chills  ROS: complete GI ROS as described in HPI, all other review negative.  Constitutional:  No unintentional weight loss   Past Medical History:  Diagnosis Date  . Anxiety   . Arthritis    osteoarthritis  . Asthma    due to seasonal alllergies  . Cataract   . Collagenous colitis 2010  . Depression    situational  . Dysrhythmia   . GERD (gastroesophageal reflux disease)   . Headache(784.0)    migraines; 2 x month  . Heart murmur    MVP ( symptomatic)  . History of IBS   . Hyperlipemia   . Hypertension    on medication x 10 years  . Shortness of breath    exertional  . Sleep apnea 2007   does not use CPAP since 40 # wt loss  . Syncopal episodes     Past Surgical History:  Procedure Laterality Date  . ABDOMINAL HYSTERECTOMY  1979  . BACK SURGERY    . BREAST CYST ASPIRATION Right 25 plus yrs ago  . CARDIAC CATHETERIZATION  2012   [facility]  . CHOLECYSTECTOMY  2011  . FLEXIBLE BRONCHOSCOPY N/A 10/10/2016   Procedure: FLEXIBLE BRONCHOSCOPY;  Surgeon: [name], MD;  Location:  [facility] ORS;  Service: Pulmonary;  Laterality: N/A;  . [place], 2012 x 2   bilateral  . LUMBAR LAMINECTOMY  7564,3329  . NASAL SINUS SURGERY  1994  . TONSILLECTOMY    . TOTAL KNEE ARTHROPLASTY  12/22/2011   Procedure: TOTAL KNEE ARTHROPLASTY;  Surgeon: Rudean Haskell, MD;  Location: [place];  Service: Orthopedics;  Laterality: Left;    Current Outpatient Prescriptions  Medication Sig Dispense Refill  . albuterol (PROVENTIL HFA;VENTOLIN HFA) 108 (90 Base) MCG/ACT inhaler Inhale 1-2 puffs into the lungs every 6 (six) hours as needed for wheezing or shortness of breath. 1 Inhaler 3  . ALPRAZolam (XANAX) 0.5 MG tablet Take 0.5 mg by mouth at bedtime.     Marland Kitchen aspirin EC 81 MG tablet Take 81 mg by mouth daily.    . benzonatate (TESSALON) 200 MG capsule Take 1 capsule (200 mg total) by mouth 3 (three) times daily. 90 capsule 10  . budesonide (ENTOCORT EC) 3 MG 24 hr capsule Take 1 capsule (3 mg total) by mouth daily. 30 capsule 3  . budesonide-formoterol (SYMBICORT) 80-4.5 MCG/ACT inhaler Inhale 2 puffs into the lungs 2 (two) times daily.    . carboxymethylcellulose (REFRESH PLUS) 0.5 % SOLN Place 1 drop into both eyes 3 (three) times daily as needed (dry eyes).    Marland Kitchen  CARTIA XT 180 MG 24 hr capsule TAKE 1 CAPSULE(180 MG) BY MOUTH DAILY 90 capsule 3  . cetirizine (ZYRTEC) 10 MG tablet Take 1 tablet (10 mg total) by mouth at bedtime. 30 tablet 11  . Dextromethorphan-Guaifenesin 15-400 MG TABS Take 1 tablet by mouth 2 (two) times daily. 60 each 10  . EPIPEN 2-PAK 0.3 MG/0.3ML SOAJ injection 0.3 mg as needed.     . furosemide (LASIX) 20 MG tablet TAKE 1 TABLET(20 MG) BY MOUTH TWICE DAILY AS NEEDED 180 tablet 3  . HYDROcodone-acetaminophen (NORCO) 10-325 MG tablet once a week.  0  . Ibuprofen-Diphenhydramine Cit (ADVIL PM) 200-38 MG TABS Take 3 tablets by mouth at bedtime.    Marland Kitchen omeprazole (PRILOSEC) 40 MG capsule Take 1 capsule (40 mg total) by mouth daily. 30 capsule 11  . propranolol  (INDERAL) 20 MG tablet TAKE 1 TABLET(20 MG) BY MOUTH THREE TIMES DAILY AS NEEDED 90 tablet 3  . rosuvastatin (CRESTOR) 40 MG tablet Take 1 tablet (40 mg total) by mouth daily. 90 tablet 3  . sertraline (ZOLOFT) 100 MG tablet Take 150 mg by mouth daily.     . Vitamin D, Ergocalciferol, (DRISDOL) 50000 units CAPS capsule once a week.  11  . vitamin E 600 UNIT capsule Take 600 Units by mouth 2 (two) times daily.     No current facility-administered medications for this visit.     Allergies as of 06/02/2017 - Review Complete 06/02/2017  Allergen Reaction Noted  . Contrast media [iodinated diagnostic agents] Anaphylaxis 12/05/2011  . Gadolinium derivatives Anaphylaxis 04/06/2014  . Gadoversetamide Anaphylaxis 04/06/2014  . Hydroxychloroquine sulfate Hives 07/04/2010  . Banana Nausea And Vomiting 05/11/2015  . Nabumetone Other (See Comments)   . Oysters [shellfish allergy] Nausea And Vomiting 05/11/2015  . Sulfa antibiotics Hives   . Sulfasalazine Hives 07/04/2010  . Sulfonamide derivatives Hives 07/04/2010  . Entocort ec [budesonide] Palpitations   . Metronidazole Rash 07/04/2010    Family History  Problem Relation Age of Onset  . Hypertension Mother   . Hyperlipidemia Mother   . Diabetes Mother   . Hypertension Father   . Hyperlipidemia Father   . Arrhythmia Father        A-Fib  . Diabetes Paternal Uncle   . Diabetes Paternal Grandfather   . Breast cancer Maternal Aunt        great in late 56's  . Anesthesia problems Neg Hx   . Colon cancer Neg Hx   . Stomach cancer Neg Hx   . Rectal cancer Neg Hx   . Liver cancer Neg Hx   . Esophageal cancer Neg Hx     Social History   Social History  . Marital status: Married    Spouse name: N/A  . Number of children: 2  . Years of education: N/A   Occupational History  . retired Pharmacist, hospital    Social History Main Topics  . Smoking status: Never Smoker  . Smokeless tobacco: Never Used  . Alcohol use No     Comment: occasional  wine - less than once a month  . Drug use: No  . Sexual activity: No   Other Topics Concern  . Not on file   Social History Narrative  . No narrative on file     Physical Exam: BP 132/86   Pulse 76   Ht _0  (1.727 m)   Wt 238 lb (108 kg)   BMI 36.19 kg/m  Constitutional: generally well-appearing Psychiatric: alert and oriented x3  Abdomen: soft, nontender, nondistended, no obvious ascites, no peritoneal signs, normal bowel sounds No peripheral edema noted in lower extremities  Assessment and plan: 71 y.o. female with Collagenous colitis her symptoms are very well controlled on Entocort one pill once a day infection is a bit constipated. I recommended she cut back to a single pill every other day and to call here in 4-5 weeks to report on her response.   Please see the "Patient Instructions" section for addition details about the plan.  Owens Loffler, MD [facility] 06/02/2017, 10:41 AM

## 2017-06-02 NOTE — Patient Instructions (Addendum)
Change to every other day entocort dosing.  Call in 4-5 weeks to report on your response.  Normal BMI (Body Mass Index- based on height and weight) is between 23 and 30. Your BMI today is Body mass index is 36.19 kg/m. Marland Kitchen Please consider follow up  regarding your BMI with your Primary Care Provider.

## 2017-07-15 DIAGNOSIS — M1990 Unspecified osteoarthritis, unspecified site: Secondary | ICD-10-CM | POA: Insufficient documentation

## 2017-07-15 DIAGNOSIS — F32A Depression, unspecified: Secondary | ICD-10-CM | POA: Diagnosis present

## 2017-07-15 DIAGNOSIS — E785 Hyperlipidemia, unspecified: Secondary | ICD-10-CM | POA: Diagnosis present

## 2017-07-19 IMAGING — CR DG CHEST 2V
1 series · 2 of 2 positions shown · non-contrast
Comparison: Chest x-ray 12/30/2014.

CLINICAL DATA: 70-year-old female with 2 near syncopal episodes in
the past 24 hours presenting with right-sided chest pain. History of
asthma.

EXAM:
CHEST  2 VIEW

[Series 1: dg chest 2 view · 0.14mm/px · 2 of 2 slices shown]
[im 1/2]
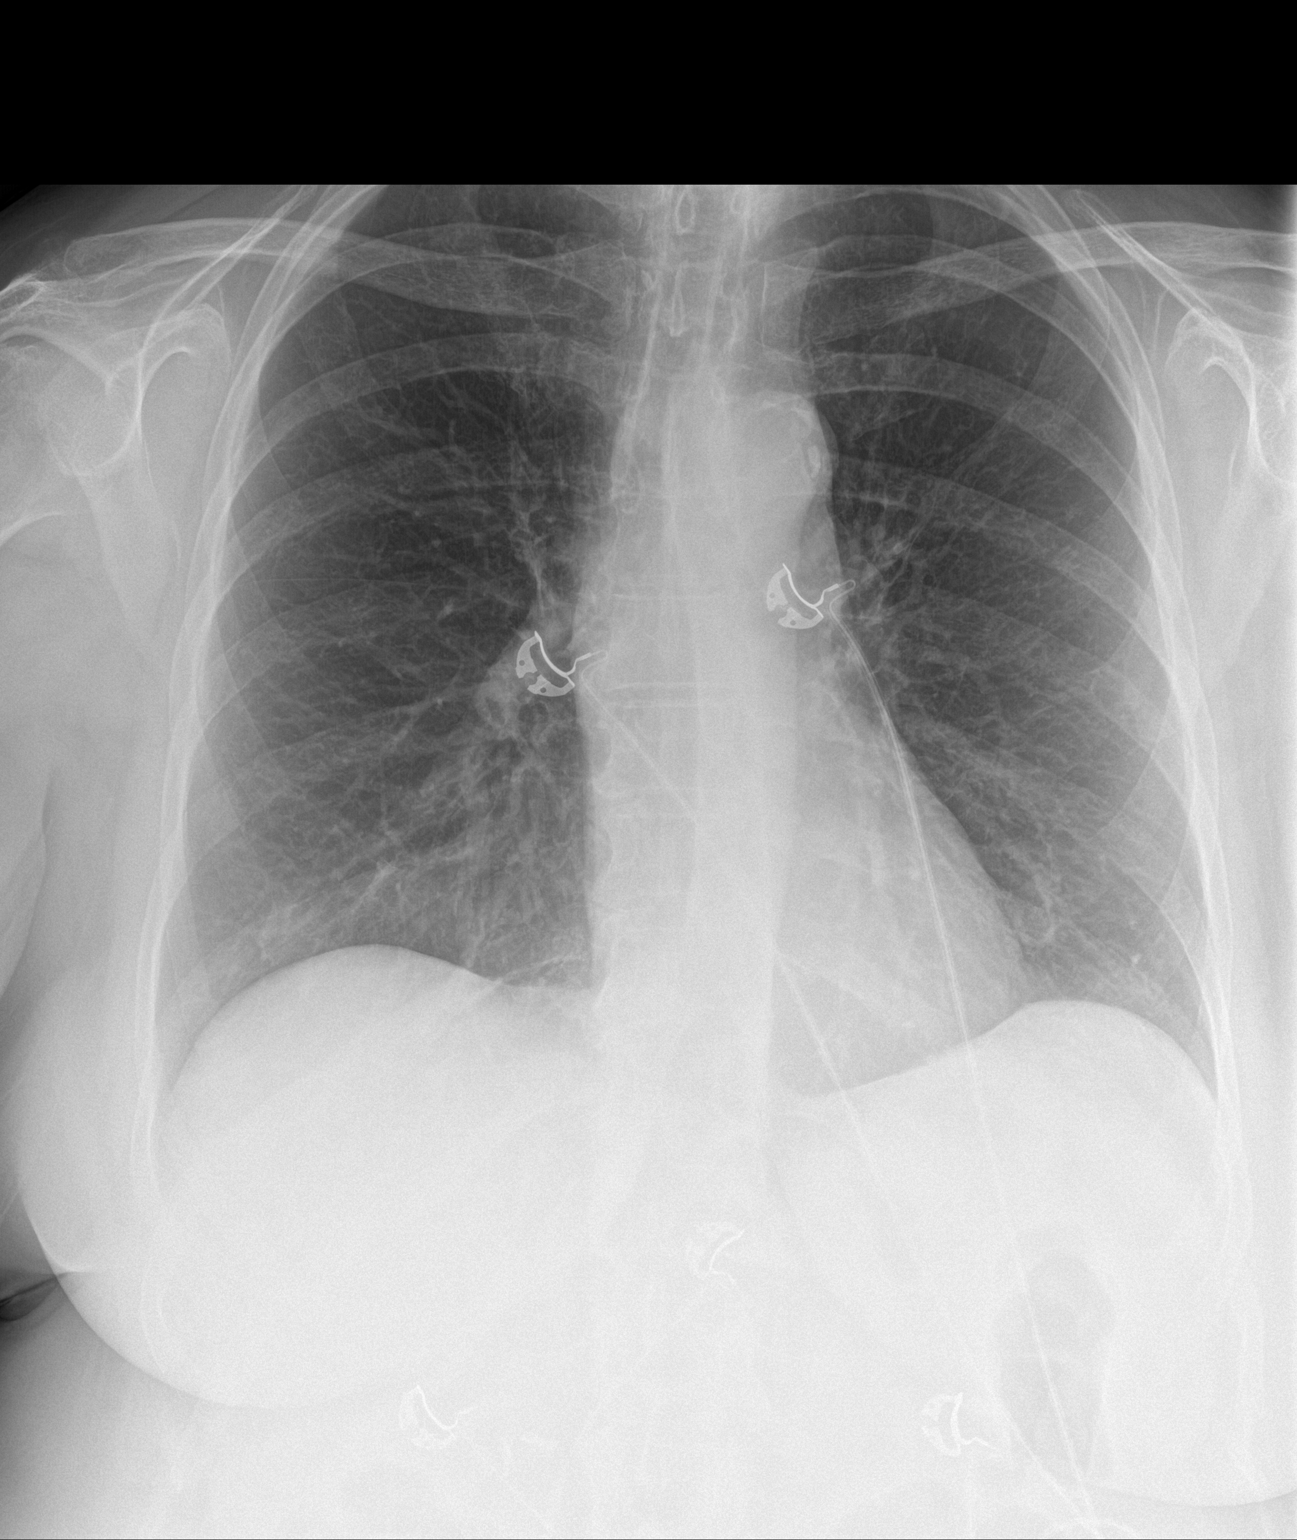
[im 2/2]
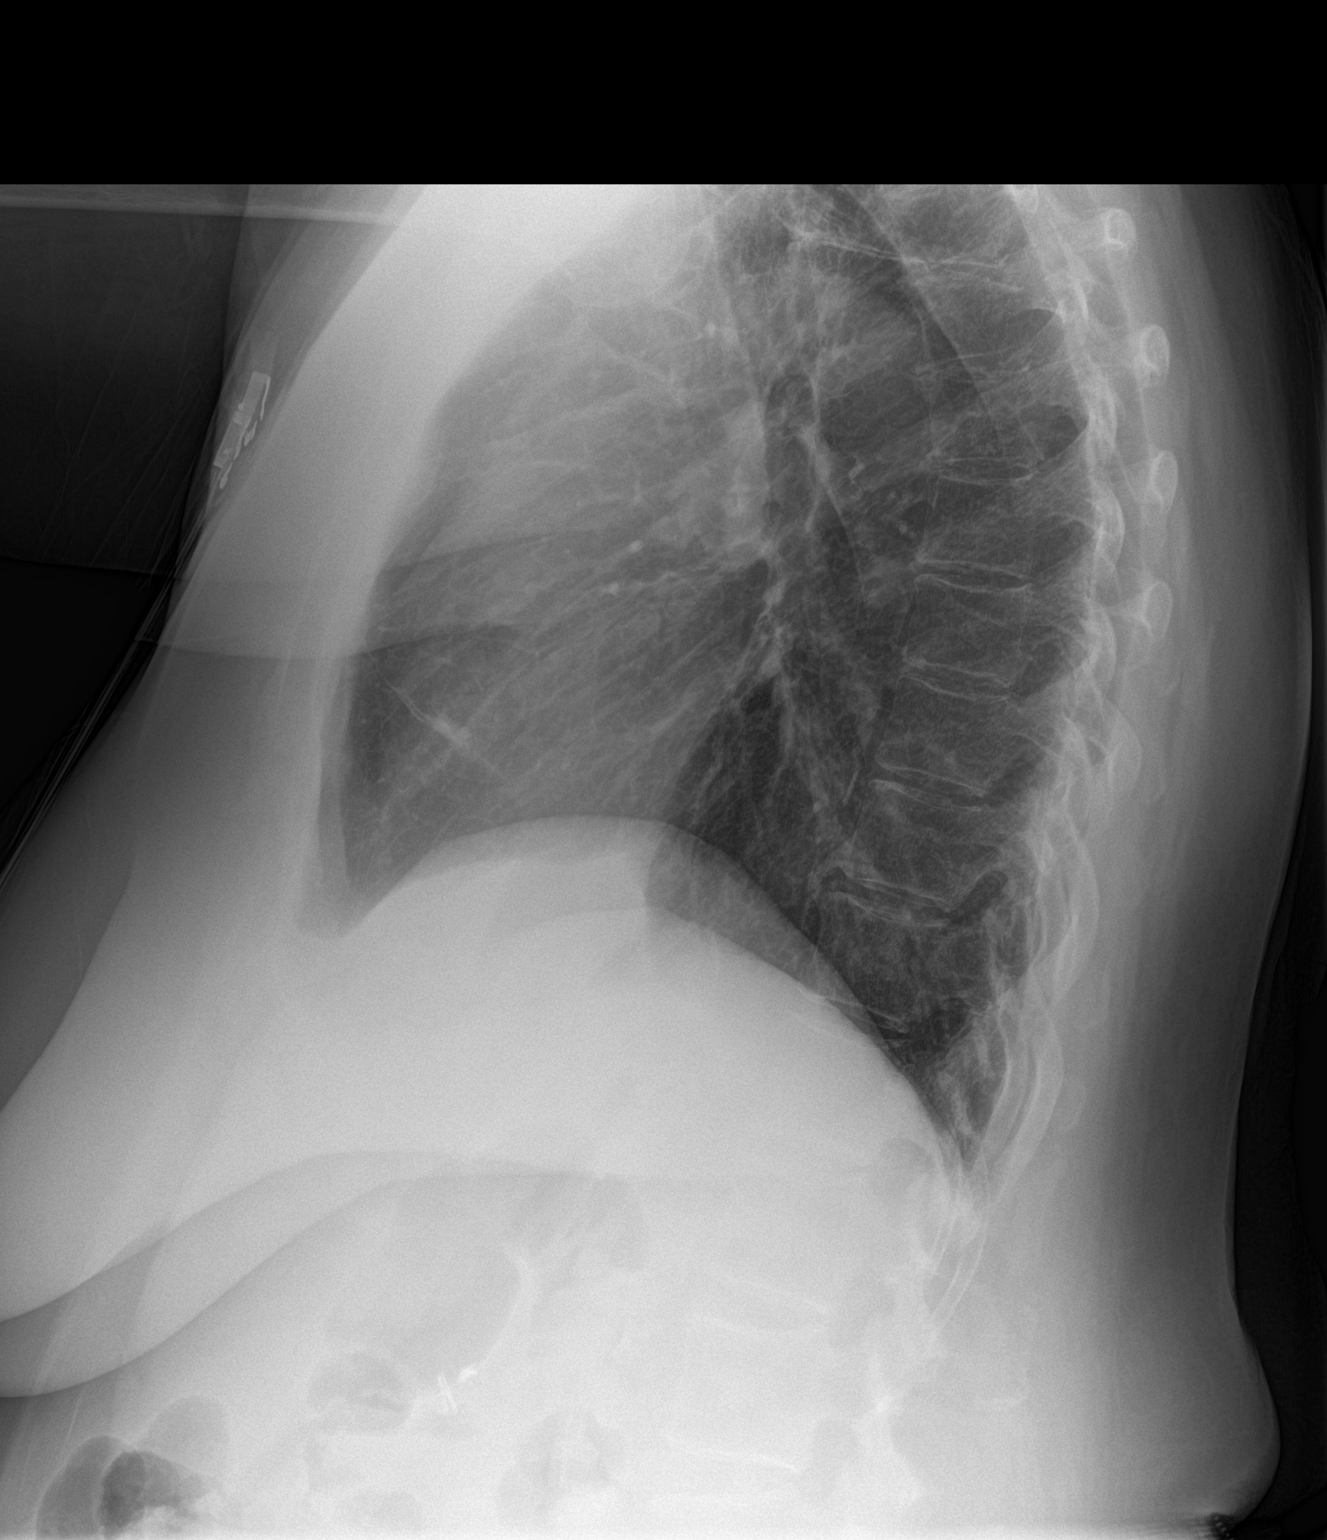

[2 of 2 positions shown; findings below may reference images not displayed]

FINDINGS: Lung volumes are normal. No consolidative airspace disease. No
pleural effusions. No pneumothorax. No pulmonary nodule or mass
noted. Pulmonary vasculature and the cardiomediastinal silhouette
are within normal limits. Atherosclerosis in the thoracic aorta.
Surgical clips project over the right upper quadrant of the abdomen,
likely from prior cholecystectomy.
IMPRESSION: 1.  No radiographic evidence of acute cardiopulmonary disease.
2. Atherosclerosis.

## 2017-07-23 ENCOUNTER — Encounter: Payer: Self-pay | Admitting: Pulmonary Disease

## 2017-07-23 ENCOUNTER — Encounter: Payer: Self-pay | Admitting: Gastroenterology

## 2017-08-05 ENCOUNTER — Other Ambulatory Visit: Payer: Self-pay | Admitting: Cardiovascular Disease

## 2017-08-16 ENCOUNTER — Encounter: Payer: Self-pay | Admitting: Gastroenterology

## 2017-08-18 ENCOUNTER — Encounter: Payer: Self-pay | Admitting: Gastroenterology

## 2017-09-02 ENCOUNTER — Other Ambulatory Visit: Payer: Self-pay | Admitting: Gastroenterology

## 2017-09-15 ENCOUNTER — Ambulatory Visit: Payer: Medicare Other | Admitting: Pulmonary Disease

## 2017-09-30 ENCOUNTER — Other Ambulatory Visit: Payer: Self-pay | Admitting: Gastroenterology

## 2017-10-09 ENCOUNTER — Ambulatory Visit: Payer: Medicare Other | Admitting: Pulmonary Disease

## 2017-10-09 ENCOUNTER — Encounter: Payer: Self-pay | Admitting: Pulmonary Disease

## 2017-10-09 VITALS — BP 122/98 | HR 91 | Ht 68.0 in | Wt 251.0 lb

## 2017-10-09 DIAGNOSIS — G4733 Obstructive sleep apnea (adult) (pediatric): Secondary | ICD-10-CM

## 2017-10-09 DIAGNOSIS — R05 Cough: Secondary | ICD-10-CM

## 2017-10-09 DIAGNOSIS — R0609 Other forms of dyspnea: Secondary | ICD-10-CM | POA: Diagnosis not present

## 2017-10-09 DIAGNOSIS — J209 Acute bronchitis, unspecified: Secondary | ICD-10-CM

## 2017-10-09 DIAGNOSIS — R053 Chronic cough: Secondary | ICD-10-CM

## 2017-10-09 MED ORDER — PREDNISONE 20 MG PO TABS: ORAL_TABLET | ORAL | 0 refills | Status: DC

## 2017-10-09 MED ORDER — DOXYCYCLINE HYCLATE 100 MG PO TABS: 100.0000 mg | ORAL_TABLET | Freq: Two times a day (BID) | ORAL | 0 refills | Status: AC

## 2017-10-09 NOTE — Progress Notes (Signed)
PULMONARY OFFICE FOLLOW UP NOTE  Requesting MD/Service: Kary Kos Date of initial consultation: 09/09/16 Reason for consultation: Chronic cough  PT PROFILE: 72 y.o. F never smoker with chronic severe cough of > 4 yrs duration, previously evaluated by Dr Chase Caller and Dr Raul Del. She has also been evaluated by ENT Tami Ribas) previously. Despite multiple therapeutic attempts, there have been no interventions or therapies that have alleviated her cough  DATA: PFTs 04/17/14: No obstruction, normal lung volumes, minimal decrease in DLCO CT chest 02/20/15: Stable small pulmonary nodules compatible with a benign process HRCT 11/20/15: Normal CT sinuses 11/20/15: Normal Spirometry 09/09/16: No obstruction Bronchoscopy 10/10/16: nasal septal perforation, otherwise normal upper airway, normal cords, mild bronchitis, minimal clear mucus, no endobronchial masses or foreign bodies. BAL grew < 10k NOF Spirometry 01/06/17: FVC:  2.75L ( 77%pred), FEV1:  2.14 L ( 79 %pred), FEV1/FVC: 78% PSG 01/15/17: AHI 10/hr. REM AHI 48/hr CPAP titration 01/28/17: CPAP 12 cm H2O recommended CT chest 02/12/17: minimal pleural based nodular infiltrate in RLL, minimal chronic fibrotic appearing changes in lingula - both stable over 2 yrs duration  SUBJ: This is a routine reevaluation scheduled at her last visit in July.  She reports that she had an excellent June and July 2018 with minimal cough.  However, her cough has relapsed since that time.  It is constant, throughout the entire day and night.  Albuterol helps occasionally but not always.  Her cough is harsh, barking up with a rattling but no real sputum production.  She denies hemoptysis.  She has had no fevers.  She has chest discomfort due to constant vigorous coughing.  She remains on CPAP for sleep apnea.  She does not like the CPAP but is using it compliantly.  She continues to have moderate exertional dyspnea with difficulty walking stairs.  OBJ: Vitals:   10/09/17 1325 10/09/17 1329  BP:  (!) 122/98  Pulse:  91  SpO2:  94%  Weight: 113.9 kg (251 lb)   Height: _0  (1.727 m)   Room air   EXAM: Gen: Constant vigorous, barking cough.  Otherwise NAD HEENT: Mild rhinitis Lungs: breath sounds full and slightly coarse with no true wheezes Cardiovascular: Reg, no murmurs noted Abdomen: Soft, nontender, normal BS Ext: without clubbing, cyanosis, edema Neuro: CNs grossly intact, motor and sensory intact   DATA:   BMP Latest Ref Rng & Units 06/04/2016 12/30/2014 10/17/2014  Glucose 65 - 99 mg/dL 119(H) 167(H) 140(H)  BUN 6 - 20 mg/dL 23(H) 7 12  Creatinine 0.44 - 1.00 mg/dL 0.89 0.65 0.72  Sodium 135 - 145 mmol/L 128(L) 134(L) 136  Potassium 3.5 - 5.1 mmol/L 4.1 3.2(L) 4.7  Chloride 101 - 111 mmol/L 95(L) 101 105  CO2 22 - 32 mmol/L _1 Calcium 8.9 - 10.3 mg/dL 9.4 8.9 9.7    CBC Latest Ref Rng & Units 06/04/2016 12/30/2014 10/17/2014  WBC 3.6 - 11.0 K/uL 11.9(H) 7.6 9.5  Hemoglobin 12.0 - 16.0 g/dL 13.8 10.7(L) 13.3  Hematocrit 35.0 - 47.0 % 39.6 33.2(L) 40.5  Platelets 150 - 440 K/uL 293 280 274    CXR:  NNF  IMPRESSION:   1. Severe relapsing chronic cough - Will treat as acute bronchitis   2. OSA (obstructive sleep apnea) - tolerating CPAP reasonably well  3. DOE (dyspnea on exertion) - not fully explained but likely due in part to obesity and deconditioning     PLAN:  Continue Symbicort and albuterol as needed Continue omeprazole and cetirizine Continue cough  suppressants -Delsym and Tessalon Prednisone 60 mg daily x3, 40 mg daily x3, 20 mg x3 daily, stop Doxycycline 100 mg twice a day for 10 days Follow-up has been arranged for 10/16/17 for quick recheck at which time we will investigate other options for treatment of chronic cough   Merton Border, MD PCCM service Mobile [phone] Pager [phone] 10/09/2017 1:48 PM

## 2017-10-09 NOTE — Patient Instructions (Signed)
Prednisone 20 mg tablets -   take 3 tablets (60 mg) a day for 3 days, 2 tablets (40 mg) a day for 3 days, 1 tablet a day for 3 days, stop  Doxycycline 100 mg twice a day for 10 days  Follow-up next week 10/16/17 for recheck

## 2017-10-12 ENCOUNTER — Encounter: Payer: Self-pay | Admitting: Pulmonary Disease

## 2017-10-16 ENCOUNTER — Ambulatory Visit: Payer: Medicare Other | Admitting: Pulmonary Disease

## 2017-10-16 ENCOUNTER — Encounter: Payer: Self-pay | Admitting: Pulmonary Disease

## 2017-10-16 ENCOUNTER — Other Ambulatory Visit: Payer: Self-pay | Admitting: Pulmonary Disease

## 2017-10-16 VITALS — BP 122/78 | HR 97 | Ht 68.0 in | Wt 252.0 lb

## 2017-10-16 DIAGNOSIS — R053 Chronic cough: Secondary | ICD-10-CM

## 2017-10-16 DIAGNOSIS — R059 Cough, unspecified: Secondary | ICD-10-CM

## 2017-10-16 DIAGNOSIS — R05 Cough: Secondary | ICD-10-CM | POA: Diagnosis not present

## 2017-10-16 DIAGNOSIS — J019 Acute sinusitis, unspecified: Secondary | ICD-10-CM | POA: Diagnosis not present

## 2017-10-16 MED ORDER — OMEPRAZOLE 40 MG PO CPDR: 40.0000 mg | DELAYED_RELEASE_CAPSULE | Freq: Two times a day (BID) | ORAL | 0 refills | Status: DC

## 2017-10-16 MED ORDER — HYDROCOD POLST-CPM POLST ER 10-8 MG/5ML PO SUER: 5.0000 mL | Freq: Two times a day (BID) | ORAL | 0 refills | Status: DC | PRN

## 2017-10-16 MED ORDER — METOPROLOL TARTRATE 25 MG PO TABS: 25.0000 mg | ORAL_TABLET | Freq: Two times a day (BID) | ORAL | 11 refills | Status: DC

## 2017-10-16 MED ORDER — HYDROCOD POLST-CPM POLST ER 10-8 MG/5ML PO SUER: 5.0000 mL | Freq: Two times a day (BID) | ORAL | Status: DC | PRN

## 2017-10-16 NOTE — Progress Notes (Signed)
PULMONARY OFFICE FOLLOW UP NOTE  Requesting MD/Service: Kary Kos Date of initial consultation: 09/09/16 Reason for consultation: Chronic cough  PT PROFILE: 72 y.o. F never smoker with chronic severe cough of > 4 yrs duration, previously evaluated by Dr Chase Caller and Dr Raul Del. She has also been evaluated by ENT Tami Ribas) previously. Despite multiple therapeutic attempts, there have been no interventions or therapies that have alleviated her cough  DATA: PFTs 04/17/14: No obstruction, normal lung volumes, minimal decrease in DLCO CT chest 02/20/15: Stable small pulmonary nodules compatible with a benign process HRCT 11/20/15: Normal CT sinuses 11/20/15: Normal Spirometry 09/09/16: No obstruction Bronchoscopy 10/10/16: nasal septal perforation, otherwise normal upper airway, normal cords, mild bronchitis, minimal clear mucus, no endobronchial masses or foreign bodies. BAL grew < 10k NOF Spirometry 01/06/17: FVC:  2.75L ( 77%pred), FEV1:  2.14 L ( 79 %pred), FEV1/FVC: 78% PSG 01/15/17: AHI 10/hr. REM AHI 48/hr CPAP titration 01/28/17: CPAP 12 cm H2O recommended CT chest 02/12/17: minimal pleural based nodular infiltrate in RLL, minimal chronic fibrotic appearing changes in lingula - both stable over 2 yrs duration  SUBJ: Seen last week for acute exacerbation of chronic cough.  At that time she was miserable with persistent vigorous coughing.  I prescribed doxycycline and prednisone.  She discontinued the prednisone due to severe hyperglycemia.  She has 2 more days of doxycycline to be taken.  She is not at all better.  She now has severe persistent cough with watery eyes, rhinorrhea, headache and facial pressure.  She denies fever.  She has chest discomfort due to constant vigorous coughing.  She continues to have moderate exertional dyspnea unchanged from previously  OBJ: Vitals:   10/16/17 1016  BP: 122/78  Pulse: 97  SpO2: 98%  Weight: 114.3 kg (252 lb)  Height: _0  (1.727 m)  Room  air   EXAM: Gen: Constant vigorous cough.  Otherwise NAD HEENT: Tympanic canals and membranes normal, nasal septal perforation, no significant rhinitis Lungs: breath sounds full and slightly coarse with no true wheezes Cardiovascular: Reg, no murmurs noted Abdomen: Soft, nontender, normal BS Ext: without clubbing, cyanosis, edema Neuro: CNs grossly intact, motor and sensory intact   DATA:   BMP Latest Ref Rng & Units 06/04/2016 12/30/2014 10/17/2014  Glucose 65 - 99 mg/dL 119(H) 167(H) 140(H)  BUN 6 - 20 mg/dL 23(H) 7 12  Creatinine 0.44 - 1.00 mg/dL 0.89 0.65 0.72  Sodium 135 - 145 mmol/L 128(L) 134(L) 136  Potassium 3.5 - 5.1 mmol/L 4.1 3.2(L) 4.7  Chloride 101 - 111 mmol/L 95(L) 101 105  CO2 22 - 32 mmol/L _1 Calcium 8.9 - 10.3 mg/dL 9.4 8.9 9.7    CBC Latest Ref Rng & Units 06/04/2016 12/30/2014 10/17/2014  WBC 3.6 - 11.0 K/uL 11.9(H) 7.6 9.5  Hemoglobin 12.0 - 16.0 g/dL 13.8 10.7(L) 13.3  Hematocrit 35.0 - 47.0 % 39.6 33.2(L) 40.5  Platelets 150 - 440 K/uL 293 280 274    CXR:  NNF  IMPRESSION:   Chronic cough with severe acute worsening Suspected acute sinusitis Suspect cyclical component Suspect worsening of GERD  PLAN:  Continue Symbicort and albuterol as needed Discontinue propranolol Metoprolol 25 mg twice a day -first dose this evening Double omeprazole to 40 mg twice a day for now Aggressive cough suppression with Tussionex as follows:  Used twice a day for the next 3-5 days until cough is restored back to usual baseline  Then use every 12 hours as needed Voice rest as we discussed Chest  x-ray and CT scans ordered Follow-up 10/30/17   Merton Border, MD PCCM service Mobile [phone] Pager [phone] 10/16/2017 10:37 AM

## 2017-10-16 NOTE — Patient Instructions (Addendum)
Discontinue propranolol Metoprolol 25 mg twice a day -first dose this evening Double omeprazole to 40 mg twice a day for now Aggressive cough suppression with Tussionex as follows:  Used twice a day for the next 3-5 days until cough is restored back to usual baseline  Then use every 12 hours as needed Voice rest as we discussed Chest x-ray and CT scans ordered Follow-up 10/30/17

## 2017-10-23 ENCOUNTER — Encounter: Payer: Self-pay | Admitting: Pulmonary Disease

## 2017-10-27 ENCOUNTER — Ambulatory Visit
Admission: RE | Admit: 2017-10-27 | Discharge: 2017-10-27 | Disposition: A | Discharge status: Home / Self Care | Payer: Medicare Other | Source: Ambulatory Visit | Attending: Pulmonary Disease | Admitting: Pulmonary Disease

## 2017-10-27 DIAGNOSIS — R05 Cough: Secondary | ICD-10-CM | POA: Insufficient documentation

## 2017-10-27 DIAGNOSIS — R059 Cough, unspecified: Secondary | ICD-10-CM

## 2017-10-27 DIAGNOSIS — R053 Chronic cough: Secondary | ICD-10-CM

## 2017-10-28 ENCOUNTER — Other Ambulatory Visit: Payer: Self-pay | Admitting: Pulmonary Disease

## 2017-10-29 ENCOUNTER — Ambulatory Visit: Payer: Medicare Other

## 2017-10-30 ENCOUNTER — Encounter: Payer: Self-pay | Admitting: Pulmonary Disease

## 2017-10-30 ENCOUNTER — Other Ambulatory Visit: Payer: Self-pay | Admitting: Gastroenterology

## 2017-10-30 ENCOUNTER — Ambulatory Visit: Payer: Medicare Other | Admitting: Pulmonary Disease

## 2017-10-30 ENCOUNTER — Other Ambulatory Visit: Payer: Self-pay | Admitting: Cardiovascular Disease

## 2017-10-30 VITALS — BP 122/84 | HR 83 | Ht 68.0 in | Wt 256.0 lb

## 2017-10-30 DIAGNOSIS — R05 Cough: Secondary | ICD-10-CM | POA: Diagnosis not present

## 2017-10-30 DIAGNOSIS — J310 Chronic rhinitis: Secondary | ICD-10-CM | POA: Diagnosis not present

## 2017-10-30 DIAGNOSIS — J3489 Other specified disorders of nose and nasal sinuses: Secondary | ICD-10-CM | POA: Diagnosis not present

## 2017-10-30 DIAGNOSIS — R053 Chronic cough: Secondary | ICD-10-CM

## 2017-10-30 MED ORDER — FLUTICASONE PROPIONATE 50 MCG/ACT NA SUSP: 2.0000 | Freq: Every day | NASAL | 10 refills | Status: DC

## 2017-10-30 MED ORDER — HYDROCOD POLST-CPM POLST ER 10-8 MG/5ML PO SUER: 5.0000 mL | Freq: Two times a day (BID) | ORAL | 0 refills | Status: DC | PRN

## 2017-10-30 NOTE — Patient Instructions (Addendum)
Continue all your medications as previously prescribed except as follows: Discontinue Symbicort Initiate Flonase -2 sprays per nostril once a day Refilled Tussionex to be used at night You are to call our office in 1-2 weeks for an update consider further therapies at that time  Follow-up in 6-8 weeks

## 2017-10-30 NOTE — Telephone Encounter (Signed)
Noted  

## 2017-10-30 NOTE — Telephone Encounter (Signed)
Pt is calling to let us know she is no longer a pt here with Dr. Rockey Situ

## 2017-11-01 ENCOUNTER — Other Ambulatory Visit: Payer: Self-pay | Admitting: Pulmonary Disease

## 2017-11-01 ENCOUNTER — Other Ambulatory Visit: Payer: Self-pay | Admitting: Gastroenterology

## 2017-11-06 ENCOUNTER — Encounter: Payer: Self-pay | Admitting: Pulmonary Disease

## 2017-11-06 NOTE — Progress Notes (Signed)
PULMONARY OFFICE FOLLOW UP NOTE  Requesting MD/Service: Kary Kos Date of initial consultation: 09/09/16 Reason for consultation: Chronic cough  PT PROFILE: 72 y.o. F never smoker with chronic severe cough of > 4 yrs duration, previously evaluated by Dr Chase Caller and Dr Raul Del. She has also been evaluated by ENT Tami Ribas) previously. Despite multiple therapeutic attempts, there have been no interventions or therapies that have alleviated her cough  DATA: PFTs 04/17/14: No obstruction, normal lung volumes, minimal decrease in DLCO CT chest 02/20/15: Stable small pulmonary nodules compatible with a benign process HRCT 11/20/15: Normal CT sinuses 11/20/15: Normal Spirometry 09/09/16: No obstruction Bronchoscopy 10/10/16: nasal septal perforation, otherwise normal upper airway, normal cords, mild bronchitis, minimal clear mucus, no endobronchial masses or foreign bodies. BAL grew < 10k NOF Spirometry 01/06/17: FVC:  2.75L ( 77%pred), FEV1:  2.14 L ( 79 %pred), FEV1/FVC: 78% PSG 01/15/17: AHI 10/hr. REM AHI 48/hr CPAP titration 01/28/17: CPAP 12 cm H2O recommended CT chest 02/12/17: minimal pleural based nodular infiltrate in RLL, minimal chronic fibrotic appearing changes in lingula - both stable over 2 yrs duration CXR 10/27/17: NACPD CT maxillofacial 10/27/17: No evidence of sinusitis  SUBJ: No change. No improvement. Remains miserable with constant vigorous coughing. Denies fever, hemoptysis. Only thing that helps her cough is Tussionex and maybe cetirizine.   OBJ: Vitals:   10/30/17 0939 10/30/17 0944  BP:  122/84  Pulse:  83  SpO2:  100%  Weight: 116.1 kg (256 lb)   Height: _0  (1.727 m)   Room air   EXAM: Gen: coughing during encounter.  Otherwise NAD HEENT: Tympanic canals and membranes normal, NSC nasal septal perforation Lungs: BS full with no wheezes Cardiovascular: Reg, no M Abdomen: Soft, nontender, normal BS Ext: without clubbing, cyanosis, edema Neuro: CNs  grossly intact, motor and sensory intact   DATA:   BMP Latest Ref Rng & Units 06/04/2016 12/30/2014 10/17/2014  Glucose 65 - 99 mg/dL 119(H) 167(H) 140(H)  BUN 6 - 20 mg/dL 23(H) 7 12  Creatinine 0.44 - 1.00 mg/dL 0.89 0.65 0.72  Sodium 135 - 145 mmol/L 128(L) 134(L) 136  Potassium 3.5 - 5.1 mmol/L 4.1 3.2(L) 4.7  Chloride 101 - 111 mmol/L 95(L) 101 105  CO2 22 - 32 mmol/L _1 Calcium 8.9 - 10.3 mg/dL 9.4 8.9 9.7    CBC Latest Ref Rng & Units 06/04/2016 12/30/2014 10/17/2014  WBC 3.6 - 11.0 K/uL 11.9(H) 7.6 9.5  Hemoglobin 12.0 - 16.0 g/dL 13.8 10.7(L) 13.3  Hematocrit 35.0 - 47.0 % 39.6 33.2(L) 40.5  Platelets 150 - 440 K/uL 293 280 274    CXR:  As above  IMPRESSION:   Chronic severe cough  Suspect cyclical component Suspect GERD Chronic nasal septal perforation  PLAN:  Continue all your medications as previously prescribed except as follows: Discontinue Symbicort - not helping Initiate Flonase -2 sprays per nostril once a day Refilled Tussionex to be used at night only You are to call our office in 1-2 weeks for an update consider further therapies at that time  Follow-up in 6-8 weeks   Merton Border, MD PCCM service Mobile [phone] Pager [phone] 11/06/2017 3:58 PM

## 2017-12-08 ENCOUNTER — Ambulatory Visit: Payer: Medicare Other | Admitting: Pulmonary Disease

## 2017-12-08 ENCOUNTER — Telehealth: Payer: Self-pay | Admitting: *Deleted

## 2017-12-08 ENCOUNTER — Encounter: Payer: Self-pay | Admitting: Pulmonary Disease

## 2017-12-08 ENCOUNTER — Telehealth: Payer: Self-pay | Admitting: Pulmonary Disease

## 2017-12-08 VITALS — BP 118/84 | HR 138 | Ht 68.0 in | Wt 254.0 lb

## 2017-12-08 DIAGNOSIS — R05 Cough: Secondary | ICD-10-CM | POA: Diagnosis not present

## 2017-12-08 DIAGNOSIS — J410 Simple chronic bronchitis: Secondary | ICD-10-CM | POA: Diagnosis not present

## 2017-12-08 DIAGNOSIS — R053 Chronic cough: Secondary | ICD-10-CM

## 2017-12-08 DIAGNOSIS — I10 Essential (primary) hypertension: Secondary | ICD-10-CM

## 2017-12-08 MED ORDER — METOPROLOL TARTRATE 50 MG PO TABS: 50.0000 mg | ORAL_TABLET | Freq: Two times a day (BID) | ORAL | 11 refills | Status: DC

## 2017-12-08 MED ORDER — HYDROCOD POLST-CPM POLST ER 10-8 MG/5ML PO SUER: 5.0000 mL | Freq: Every evening | ORAL | 0 refills | Status: DC | PRN

## 2017-12-08 MED ORDER — METHYLPREDNISOLONE ACETATE 80 MG/ML IJ SUSP
80.0000 mg | Freq: Once | INTRAMUSCULAR | Status: AC
  Administered 2017-12-08: 80 mg via INTRAMUSCULAR

## 2017-12-08 NOTE — Telephone Encounter (Signed)
Pharmacy calling to clarify quantity for tussionex of 437 ML   Please call Caitlin Leonard [phone]

## 2017-12-08 NOTE — Telephone Encounter (Signed)
Pt informed of regular bronch.  PROVIDER: SIMON[facility]  DX: CHRONIC COUGH, CHRONIC BRONCHITIS PROCEDURE: REGULAR BRONCH DATE: 12/11/17

## 2017-12-08 NOTE — Patient Instructions (Addendum)
Depo-Medrol 120 mg today We have scheduled repeat bronchoscopy for writing 12/11/17 Resume Symbicort inhaler as previously prescribed Increase metoprolol to 50 mg twice a day Follow-up in 3-4 weeks

## 2017-12-08 NOTE — Telephone Encounter (Signed)
Spoke with Caitlin Leonard at [place] and she states she will not fill the Tussinex for the 479ml as ordered by DS. She states we can fill 119ml. Informed to proceed with the 130ml. DS informed of max amount. Nothing further needed.

## 2017-12-09 ENCOUNTER — Encounter: Payer: Self-pay | Admitting: Pulmonary Disease

## 2017-12-09 NOTE — Telephone Encounter (Signed)
Called [facility] and spoke with Merrilee Seashore.  No PA required for procedure code [id number]. Call Ref # [phone]. Rhonda J Cobb

## 2017-12-09 NOTE — H&P (View-Only) (Signed)
PULMONARY OFFICE FOLLOW UP NOTE  Requesting MD/Service: Kary Kos Date of initial consultation: 09/09/16 Reason for consultation: Chronic cough  PT PROFILE: 72 y.o. F never smoker with chronic severe cough of > 4 yrs duration, previously evaluated by Dr Chase Caller and Dr Raul Del. She has also been evaluated by ENT Tami Ribas) previously. Despite multiple therapeutic attempts, there have been no interventions or therapies that have alleviated her cough  DATA: PFTs 04/17/14: No obstruction, normal lung volumes, minimal decrease in DLCO CT chest 02/20/15: Stable small pulmonary nodules compatible with a benign process HRCT 11/20/15: Normal CT sinuses 11/20/15: Normal Spirometry 09/09/16: No obstruction Bronchoscopy 10/10/16: nasal septal perforation, otherwise normal upper airway, normal cords, mild bronchitis, minimal clear mucus, no endobronchial masses or foreign bodies. BAL grew < 10k NOF Spirometry 01/06/17: FVC:  2.75L ( 77%pred), FEV1:  2.14 L ( 79 %pred), FEV1/FVC: 78% PSG 01/15/17: AHI 10/hr. REM AHI 48/hr CPAP titration 01/28/17: CPAP 12 cm H2O recommended CT chest 02/12/17: minimal pleural based nodular infiltrate in RLL, minimal chronic fibrotic appearing changes in lingula - both stable over 2 yrs duration CXR 10/27/17: NACPD CT maxillofacial 10/27/17: No evidence of sinusitis  SUBJ: This is a routine reevaluation for chronic cough.  She is absolutely no better and remains miserable with constant cough.  Her cough remains rattling but mostly nonproductive.  Continues to deny fevers and hemoptysis.  Despite all of my efforts over several visits, the only thing that his been beneficial to her has been cough suppression with Tussionex.  It is, however, notable that her cough did improve significantly for several months after bronchoscopy in January 2018.  Last visit, I recommended discontinuation of the Symbicort inhaler with the thinking that it might be contributing to upper airway  cough syndrome.  This change led to no improvement and no definite worsening in her symptoms.  Last visit, we changed propranolol to metoprolol at 25 mg twice a day.  She is concerned that her blood pressure has been tending to run a little high since this change was made.  OBJ: Vitals:   12/08/17 0958 12/08/17 1002  BP:  118/84  Pulse:  (!) 138  SpO2:  92%  Weight: 115.2 kg (254 lb)   Height: _0  (1.727 m)   Room air   EXAM: Gen: Vigorous barking cough during encounter.  Otherwise NAD HEENT: No new findings Lungs: BS full, slightly coarse with no wheezes Cardiovascular: Reg, no M Abdomen: Soft, nontender, normal BS Ext: without clubbing, cyanosis, edema Neuro: CNs grossly intact, motor and sensory intact   DATA:   BMP Latest Ref Rng & Units 06/04/2016 12/30/2014 10/17/2014  Glucose 65 - 99 mg/dL 119(H) 167(H) 140(H)  BUN 6 - 20 mg/dL 23(H) 7 12  Creatinine 0.44 - 1.00 mg/dL 0.89 0.65 0.72  Sodium 135 - 145 mmol/L 128(L) 134(L) 136  Potassium 3.5 - 5.1 mmol/L 4.1 3.2(L) 4.7  Chloride 101 - 111 mmol/L 95(L) 101 105  CO2 22 - 32 mmol/L _1 Calcium 8.9 - 10.3 mg/dL 9.4 8.9 9.7    CBC Latest Ref Rng & Units 06/04/2016 12/30/2014 10/17/2014  WBC 3.6 - 11.0 K/uL 11.9(H) 7.6 9.5  Hemoglobin 12.0 - 16.0 g/dL 13.8 10.7(L) 13.3  Hematocrit 35.0 - 47.0 % 39.6 33.2(L) 40.5  Platelets 150 - 440 K/uL 293 280 274    CXR: No new film  IMPRESSION:   Chronic severe cough  Chronic bronchitis Suspect cyclical component Suspect GERD Chronic nasal septal perforation Hypertension  PLAN:  Depo-Medrol  120 mg today We have scheduled repeat bronchoscopy for 12/11/17 Resume Symbicort inhaler as previously prescribed Increase metoprolol to 50 mg twice a day Follow-up in 3-4 weeks   Merton Border, MD PCCM service Mobile [phone] Pager [phone] 12/09/2017 12:59 PM

## 2017-12-09 NOTE — Progress Notes (Signed)
PULMONARY OFFICE FOLLOW UP NOTE  Requesting MD/Service: Kary Kos Date of initial consultation: 09/09/16 Reason for consultation: Chronic cough  PT PROFILE: 72 y.o. F never smoker with chronic severe cough of > 4 yrs duration, previously evaluated by Dr Chase Caller and Dr Raul Del. She has also been evaluated by ENT Tami Ribas) previously. Despite multiple therapeutic attempts, there have been no interventions or therapies that have alleviated her cough  DATA: PFTs 04/17/14: No obstruction, normal lung volumes, minimal decrease in DLCO CT chest 02/20/15: Stable small pulmonary nodules compatible with a benign process HRCT 11/20/15: Normal CT sinuses 11/20/15: Normal Spirometry 09/09/16: No obstruction Bronchoscopy 10/10/16: nasal septal perforation, otherwise normal upper airway, normal cords, mild bronchitis, minimal clear mucus, no endobronchial masses or foreign bodies. BAL grew < 10k NOF Spirometry 01/06/17: FVC:  2.75L ( 77%pred), FEV1:  2.14 L ( 79 %pred), FEV1/FVC: 78% PSG 01/15/17: AHI 10/hr. REM AHI 48/hr CPAP titration 01/28/17: CPAP 12 cm H2O recommended CT chest 02/12/17: minimal pleural based nodular infiltrate in RLL, minimal chronic fibrotic appearing changes in lingula - both stable over 2 yrs duration CXR 10/27/17: NACPD CT maxillofacial 10/27/17: No evidence of sinusitis  SUBJ: This is a routine reevaluation for chronic cough.  She is absolutely no better and remains miserable with constant cough.  Her cough remains rattling but mostly nonproductive.  Continues to deny fevers and hemoptysis.  Despite all of my efforts over several visits, the only thing that his been beneficial to her has been cough suppression with Tussionex.  It is, however, notable that her cough did improve significantly for several months after bronchoscopy in January 2018.  Last visit, I recommended discontinuation of the Symbicort inhaler with the thinking that it might be contributing to upper airway  cough syndrome.  This change led to no improvement and no definite worsening in her symptoms.  Last visit, we changed propranolol to metoprolol at 25 mg twice a day.  She is concerned that her blood pressure has been tending to run a little high since this change was made.  OBJ: Vitals:   12/08/17 0958 12/08/17 1002  BP:  118/84  Pulse:  (!) 138  SpO2:  92%  Weight: 115.2 kg (254 lb)   Height: _0  (1.727 m)   Room air   EXAM: Gen: Vigorous barking cough during encounter.  Otherwise NAD HEENT: No new findings Lungs: BS full, slightly coarse with no wheezes Cardiovascular: Reg, no M Abdomen: Soft, nontender, normal BS Ext: without clubbing, cyanosis, edema Neuro: CNs grossly intact, motor and sensory intact   DATA:   BMP Latest Ref Rng & Units 06/04/2016 12/30/2014 10/17/2014  Glucose 65 - 99 mg/dL 119(H) 167(H) 140(H)  BUN 6 - 20 mg/dL 23(H) 7 12  Creatinine 0.44 - 1.00 mg/dL 0.89 0.65 0.72  Sodium 135 - 145 mmol/L 128(L) 134(L) 136  Potassium 3.5 - 5.1 mmol/L 4.1 3.2(L) 4.7  Chloride 101 - 111 mmol/L 95(L) 101 105  CO2 22 - 32 mmol/L _1 Calcium 8.9 - 10.3 mg/dL 9.4 8.9 9.7    CBC Latest Ref Rng & Units 06/04/2016 12/30/2014 10/17/2014  WBC 3.6 - 11.0 K/uL 11.9(H) 7.6 9.5  Hemoglobin 12.0 - 16.0 g/dL 13.8 10.7(L) 13.3  Hematocrit 35.0 - 47.0 % 39.6 33.2(L) 40.5  Platelets 150 - 440 K/uL 293 280 274    CXR: No new film  IMPRESSION:   Chronic severe cough  Chronic bronchitis Suspect cyclical component Suspect GERD Chronic nasal septal perforation Hypertension  PLAN:  Depo-Medrol  120 mg today We have scheduled repeat bronchoscopy for 12/11/17 Resume Symbicort inhaler as previously prescribed Increase metoprolol to 50 mg twice a day Follow-up in 3-4 weeks   Merton Border, MD PCCM service Mobile [phone] Pager [phone] 12/09/2017 12:59 PM

## 2017-12-11 ENCOUNTER — Ambulatory Visit
Admission: RE | Admit: 2017-12-11 | Discharge: 2017-12-11 | Disposition: A | Discharge status: Home / Self Care | Payer: Medicare Other | Source: Ambulatory Visit | Attending: Pulmonary Disease | Admitting: Pulmonary Disease

## 2017-12-11 ENCOUNTER — Encounter
Admission: RE | Disposition: A | Discharge status: Home / Self Care | Payer: Self-pay | Source: Ambulatory Visit | Attending: Pulmonary Disease

## 2017-12-11 ENCOUNTER — Encounter: Payer: Self-pay | Admitting: Emergency Medicine

## 2017-12-11 DIAGNOSIS — I10 Essential (primary) hypertension: Secondary | ICD-10-CM | POA: Insufficient documentation

## 2017-12-11 DIAGNOSIS — R05 Cough: Secondary | ICD-10-CM | POA: Insufficient documentation

## 2017-12-11 DIAGNOSIS — J42 Unspecified chronic bronchitis: Secondary | ICD-10-CM

## 2017-12-11 DIAGNOSIS — Z7951 Long term (current) use of inhaled steroids: Secondary | ICD-10-CM | POA: Diagnosis not present

## 2017-12-11 HISTORY — PX: FLEXIBLE BRONCHOSCOPY: SHX5094

## 2017-12-11 SURGERY — BRONCHOSCOPY, FLEXIBLE
Anesthesia: Moderate Sedation

## 2017-12-11 MED ORDER — MIDAZOLAM HCL 5 MG/5ML IJ SOLN
INTRAMUSCULAR | Status: AC
  Filled 2017-12-11: qty 10

## 2017-12-11 MED ORDER — PHENYLEPHRINE HCL 0.25 % NA SOLN
1.0000 | Freq: Four times a day (QID) | NASAL | Status: DC | PRN
  Filled 2017-12-11: qty 15

## 2017-12-11 MED ORDER — BUTAMBEN-TETRACAINE-BENZOCAINE 2-2-14 % EX AERO
1.0000 | INHALATION_SPRAY | Freq: Once | CUTANEOUS | Status: AC
  Administered 2017-12-11: 1 via TOPICAL
  Filled 2017-12-11: qty 20

## 2017-12-11 MED ORDER — FENTANYL CITRATE (PF) 100 MCG/2ML IJ SOLN
INTRAMUSCULAR | Status: DC
Start: 2017-12-11 — End: 2017-12-11
  Filled 2017-12-11: qty 4

## 2017-12-11 MED ORDER — BUTAMBEN-TETRACAINE-BENZOCAINE 2-2-14 % EX AERO
INHALATION_SPRAY | CUTANEOUS | Status: AC
  Administered 2017-12-11: 1 via TOPICAL
  Filled 2017-12-11: qty 5

## 2017-12-11 MED ORDER — MIDAZOLAM HCL 5 MG/5ML IJ SOLN
INTRAMUSCULAR | Status: AC | PRN
  Administered 2017-12-11 (×2): 2 mg via INTRAVENOUS

## 2017-12-11 MED ORDER — FENTANYL CITRATE (PF) 100 MCG/2ML IJ SOLN
INTRAMUSCULAR | Status: AC | PRN
  Administered 2017-12-11: 50 ug via INTRAVENOUS
  Administered 2017-12-11: 25 ug via INTRAVENOUS

## 2017-12-11 MED ORDER — LIDOCAINE VISCOUS 2 % MT SOLN
OROMUCOSAL | Status: AC
  Filled 2017-12-11: qty 15

## 2017-12-11 MED ORDER — LIDOCAINE HCL 2 % EX GEL
1.0000 "application " | Freq: Once | CUTANEOUS | Status: AC
  Administered 2017-12-11: 1 via TOPICAL
  Filled 2017-12-11: qty 5

## 2017-12-11 NOTE — Sedation Documentation (Signed)
ETCO2 unavailable at this time, RT aware

## 2017-12-11 NOTE — Interval H&P Note (Signed)
History and Physical Interval Note:  12/11/2017 1:26 PM  Caitlin Leonard  has presented today for surgery, with the diagnosis of Regular bronchoscopy    Chronic cough    [facility] emailed  cc:  [facility]  The various methods of treatment have been discussed with the patient and family. After consideration of risks, benefits and other options for treatment, the patient has consented to  Procedure(s): FLEXIBLE BRONCHOSCOPY (N/A) as a surgical intervention .  The patient's history has been reviewed, patient examined, no change in status, stable for surgery.  I have reviewed the patient's chart and labs.  Questions were answered to the patient's satisfaction.     [name]

## 2017-12-11 NOTE — Sedation Documentation (Signed)
Oxygen tent applied by RT

## 2017-12-11 NOTE — Discharge Instructions (Signed)
Flexible Bronchoscopy, Care After This sheet gives you information about how to care for yourself after your procedure. Your health care provider may also give you more specific instructions. If you have problems or questions, contact your health care provider. What can I expect after the procedure? After the procedure, it is common to have the following symptoms for 24-48 hours:  A cough that is worse than it was before the procedure.  A low-grade fever.  A sore throat or hoarse voice.  Small streaks of blood in the mucus from your lungs (sputum), if tissue samples were removed (biopsy).  Follow these instructions at home: Eating and drinking  Do not eat or drink anything (including water) for 2 hours after your procedure, or until your numbing medicine (local anesthetic) has worn off. Having a numb throat increases your risk of burning yourself or choking.  After your numbness is gone and your cough and gag reflexes have returned, you may start eating only soft foods and slowly drinking liquids.  The day after the procedure, return to your normal diet. Driving  Do not drive for 24 hours if you were given a medicine to help you relax (sedative).  Do not drive or use heavy machinery while taking prescription pain medicine. General instructions  Take over-the-counter and prescription medicines only as told by your health care provider.  Return to your normal activities as told by your health care provider. Ask your health care provider what activities are safe for you.  Do not use any products that contain nicotine or tobacco, such as cigarettes and e-cigarettes. If you need help quitting, ask your health care provider.  Keep all follow-up visits as told by your health care provider. This is important, especially if you had a biopsy taken. Get help right away if:  You have shortness of breath that gets worse.  You become light-headed or feel like you might faint.  You have  chest pain.  You cough up more than a small amount of blood.  The amount of blood you cough up increases. Summary  Common symptoms in the 24-48 hours following a flexible bronchoscopy include cough, low-grade fever, sore throat or hoarse voice, and blood-streaked mucus from the lungs (if you had a biopsy).  Do not eat or drink anything (including water) for 2 hours after your procedure, or until your local anesthetic has worn off. You can return to your normal diet the day after the procedure.  Get help right away if you develop worsening shortness of breath, have chest pain, become light-headed, or cough up more than a small amount of blood. This information is not intended to replace advice given to you by your health care provider. Make sure you discuss any questions you have with your health care provider. Document Released: 04/11/2005 Document Revised: 10/10/2016 Document Reviewed: 10/10/2016 Elsevier Interactive Patient Education  2017 [place]. Moderate Conscious Sedation, Adult, Care After These instructions provide you with information about caring for yourself after your procedure. Your health care provider may also give you more specific instructions. Your treatment has been planned according to current medical practices, but problems sometimes occur. Call your health care provider if you have any problems or questions after your procedure. What can I expect after the procedure? After your procedure, it is common:  To feel sleepy for several hours.  To feel clumsy and have poor balance for several hours.  To have poor judgment for several hours.  To vomit if you eat too soon.  Follow these instructions at home: For at least 24 hours after the procedure:   Do not: ? Participate in activities where you could fall or become injured. ? Drive. ? Use heavy machinery. ? Drink alcohol. ? Take sleeping pills or medicines that cause drowsiness. ? Make important decisions  or sign legal documents. ? Take care of children on your own.  Rest. Eating and drinking  Follow the diet recommended by your health care provider.  If you vomit: ? Drink water, juice, or soup when you can drink without vomiting. ? Make sure you have little or no nausea before eating solid foods. General instructions  Have a responsible adult stay with you until you are awake and alert.  Take over-the-counter and prescription medicines only as told by your health care provider.  If you smoke, do not smoke without supervision.  Keep all follow-up visits as told by your health care provider. This is important. Contact a health care provider if:  You keep feeling nauseous or you keep vomiting.  You feel light-headed.  You develop a rash.  You have a fever. Get help right away if:  You have trouble breathing. This information is not intended to replace advice given to you by your health care provider. Make sure you discuss any questions you have with your health care provider. Document Released: 07/13/2013 Document Revised: 02/25/2016 Document Reviewed: 01/12/2016 Elsevier Interactive Patient Education  [employer].

## 2017-12-11 NOTE — Procedures (Signed)
Indication:   Chronic, severe cough.  This is a repeat FOB (previously done in 10/2016) for disabling cough. Previously found to have severe acute bronchitic changes  Premedication:   Fentanyl 50 + 25 mcg Midaz 2 + 2 mg  Anesthesia: Topical to nose and throat 40 cc of 1% lidocaine used during the course of procedure  Procedure: After adequate sedation and anesthesia, the bronchoscope was introduced via the R naris and advanced into the posterior pharynx. Further anesthesia was obtained with 1% lidocaine and the scope was advanced into the trachea. Complete airway anesthesia was achieved with 1% lidocaine and a thorough airway examination was performed. This revealed the following findings:  Findings:  Upper airway - normal anatomy, cords moved symmetrically  Tracheobronchial anatomy - normal anatomic arrangement Bronchial mucosa - moderate to severe erythematous and edematous changes. Airways bled easily with minimal scope trauma Other - no endobronchial tumors, masses or FBs. Apical segment of RUL not fully visualized  Specimens:   BAL from RML Endobronchial biopsy from RML  Micro, cytology and surgical path specimens. Looking specifically for eosinophilic bronchitis  Complications: Moderate cough throughout the procedure. Mild bleeding from scope trauma  Post procedure evaluation:  The patient tolerated the procedure well with no major complications CXR - not indicated   Merton Border, MD PCCM service Mobile [phone] Pager [phone]  12/11/2017 2:06 PM

## 2017-12-13 ENCOUNTER — Encounter: Payer: Self-pay | Admitting: Pulmonary Disease

## 2017-12-14 ENCOUNTER — Telehealth: Payer: Self-pay | Admitting: Pulmonary Disease

## 2017-12-14 ENCOUNTER — Encounter: Payer: Self-pay | Admitting: Pulmonary Disease

## 2017-12-14 LAB — CULTURE, BAL-QUANTITATIVE W GRAM STAIN: Culture: 30000 — AB

## 2017-12-14 LAB — CULTURE, BAL-QUANTITATIVE

## 2017-12-14 NOTE — Telephone Encounter (Signed)
Pt c/o Shortness Of Breath: STAT if SOB developed within the last 24 hours or pt is noticeably SOB on the phone  1. Are you currently SOB (can you hear that pt is SOB on the phone)? No but coughing   2. How long have you been experiencing SOB? Since bronch   3. Are you SOB when sitting or when up moving around? All the time   4. Are you currently experiencing any other symptoms? Coughing cant catch breath through the gluey mess

## 2017-12-15 LAB — SURGICAL PATHOLOGY

## 2017-12-15 LAB — CYTOLOGY - NON PAP

## 2017-12-15 NOTE — Telephone Encounter (Signed)
tessalon perles 200 mg tid as needed

## 2017-12-15 NOTE — Telephone Encounter (Signed)
Prednisone 10 mg daily for 5

## 2017-12-15 NOTE — Telephone Encounter (Signed)
Patient is already on [employer]. She is aware messages willl be forwarded to DS when he returns from vacation.

## 2017-12-15 NOTE — Telephone Encounter (Signed)
Patient states she is allergic to Prednisone. Please advise?

## 2017-12-17 ENCOUNTER — Other Ambulatory Visit: Payer: Self-pay | Admitting: Pulmonary Disease

## 2017-12-20 ENCOUNTER — Other Ambulatory Visit: Payer: Self-pay | Admitting: Pulmonary Disease

## 2017-12-22 ENCOUNTER — Other Ambulatory Visit: Payer: Self-pay | Admitting: Pulmonary Disease

## 2017-12-22 ENCOUNTER — Other Ambulatory Visit: Payer: Self-pay | Admitting: *Deleted

## 2017-12-22 DIAGNOSIS — J410 Simple chronic bronchitis: Secondary | ICD-10-CM

## 2017-12-22 DIAGNOSIS — R05 Cough: Secondary | ICD-10-CM

## 2017-12-22 DIAGNOSIS — R059 Cough, unspecified: Secondary | ICD-10-CM

## 2017-12-22 MED ORDER — BUDESONIDE 0.5 MG/2ML IN SUSP: 0.5000 mg | Freq: Two times a day (BID) | RESPIRATORY_TRACT | 11 refills | Status: DC

## 2017-12-22 NOTE — Progress Notes (Signed)
EBBx reveals eosinophils in bronchial mucosa. 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 She has persistent and severe cough despite ICS/LABA by [facility]. Therefore, have ordered nebulized budesonide  Merton Border, MD PCCM service Mobile [phone] Pager [phone] 12/22/2017 2:31 PM

## 2018-01-05 ENCOUNTER — Ambulatory Visit: Payer: Medicare Other | Admitting: Pulmonary Disease

## 2018-01-05 ENCOUNTER — Encounter: Payer: Self-pay | Admitting: Pulmonary Disease

## 2018-01-05 VITALS — BP 122/70 | HR 135 | Ht 68.0 in | Wt 250.0 lb

## 2018-01-05 DIAGNOSIS — R05 Cough: Secondary | ICD-10-CM

## 2018-01-05 DIAGNOSIS — J40 Bronchitis, not specified as acute or chronic: Secondary | ICD-10-CM

## 2018-01-05 DIAGNOSIS — R053 Chronic cough: Secondary | ICD-10-CM

## 2018-01-05 DIAGNOSIS — D7218 Eosinophilia in diseases classified elsewhere: Secondary | ICD-10-CM

## 2018-01-05 MED ORDER — HYDROCOD POLST-CPM POLST ER 10-8 MG/5ML PO SUER: 5.0000 mL | Freq: Two times a day (BID) | ORAL | 0 refills | Status: DC | PRN

## 2018-01-05 MED ORDER — METHYLPREDNISOLONE ACETATE 80 MG/ML IJ SUSP
120.0000 mg | Freq: Once | INTRAMUSCULAR | Status: AC
  Administered 2018-01-05: 120 mg via INTRAMUSCULAR

## 2018-01-05 NOTE — Progress Notes (Addendum)
PULMONARY OFFICE FOLLOW UP NOTE  Requesting MD/Service: Kary Kos Date of initial consultation: 09/09/16 Reason for consultation: Chronic cough  PT PROFILE: 72 y.o. F never smoker with chronic severe cough of > 4 yrs duration, previously evaluated by Dr Chase Caller and Dr Raul Del. She has also been evaluated by ENT Tami Ribas) previously. Despite multiple therapeutic attempts, there have been no interventions or therapies that have alleviated her cough  DATA: PFTs 04/17/14: No obstruction, normal lung volumes, minimal decrease in DLCO CT chest 02/20/15: Stable small pulmonary nodules compatible with a benign process HRCT 11/20/15: Normal CT sinuses 11/20/15: Normal Spirometry 09/09/16: No obstruction Bronchoscopy 10/10/16: nasal septal perforation, otherwise normal upper airway, normal cords, mild bronchitis, minimal clear mucus, no endobronchial masses or foreign bodies. BAL grew < 10k NOF Spirometry 01/06/17: FVC:  2.75L (77%pred), FEV1:  2.14 L (79 %pred), FEV1/FVC: 78% PSG 01/15/17: AHI 10/hr. REM AHI 48/hr CPAP titration 01/28/17: CPAP 12 cm H2O recommended CT chest 02/12/17: minimal pleural based nodular infiltrate in RLL, minimal chronic fibrotic appearing changes in lingula - both stable over 2 yrs duration CXR 10/27/17: NACPD CT maxillofacial 10/27/17: No evidence of sinusitis Bronchoscopy 12/11/17: normal anatomy. Moderate to severe diffuse erythematous and hyperemic mucosal changes which bled very easily with minimal scope trauma. Endobronchial mucosal biopsies obtained. Pathology: RESPIRATORY MUCOSA WITH MILD EOSINOPHILIC STROMAL INFILTRATE. NEGATIVE FOR MALIGNANCY. 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 Nebulized budesonide triggering migraines. Remains miserable. Depomedrol 120 mg IM administered. Tussionex refilled. Referral to [facility] Pulmonary requested  SUBJ: The  recent bronchoscopy results are noted above.  Based on these findings, I initiated nebulized budesonide.  She believes that the cough is no better and remains miserable.  She believes that the budesonide is triggering migraines and making her feel worse overall.  She has had occasional episodes of scant hemoptysis.  Her cough is otherwise mostly nonproductive with no purulent sputum.  She denies pleuritic chest pain and fever.  She is essentially at her wit's end.  OBJ: Vitals:   01/05/18 0948 01/05/18 0951  BP:  122/70  Pulse:  (!) 135  SpO2:  96%  Weight: 250 lb (113.4 kg)   Height: _0  (1.727 m)   Room air   EXAM: Gen: Frequent vigorous barking cough during encounter HEENT: NCAT, sclerae white Lungs: Breath sounds full and coarse without wheezes Cardiovascular: Regular, no M Abdomen: Soft, NT, NABS Ext: No C/C/E Neuro: No focal deficits on limited exam   DATA:   BMP Latest Ref Rng & Units 06/04/2016 12/30/2014 10/17/2014  Glucose 65 - 99 mg/dL 119(H) 167(H) 140(H)  BUN 6 - 20 mg/dL 23(H) 7 12  Creatinine 0.44 - 1.00 mg/dL 0.89 0.65 0.72  Sodium 135 - 145 mmol/L 128(L) 134(L) 136  Potassium 3.5 - 5.1 mmol/L 4.1 3.2(L) 4.7  Chloride 101 - 111 mmol/L 95(L) 101 105  CO2 22 - 32 mmol/L _1 Calcium 8.9 - 10.3 mg/dL 9.4 8.9 9.7    CBC Latest Ref Rng & Units 06/04/2016 12/30/2014 10/17/2014  WBC 3.6 - 11.0 K/uL 11.9(H) 7.6 9.5  Hemoglobin 12.0 - 16.0 g/dL 13.8 10.7(L) 13.3  Hematocrit 35.0 - 47.0 % 39.6 33.2(L) 40.5  Platelets 150 - 440 K/uL 293 280 274    CXR: No new film  IMPRESSION:   Chronic severe cough  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

## 2018-01-05 NOTE — Patient Instructions (Signed)
Decrease Pulmicort (budesonide) dose to 0.25 mg nebulized twice a day Tussionex refilled Depo-Medrol 120 mg IM administered today Referral to [facility] pulmonary medicine for further evaluation Follow-up will be arranged after evaluation at [facility] has been completed

## 2018-01-06 ENCOUNTER — Encounter: Payer: Self-pay | Admitting: Pulmonary Disease

## 2018-01-11 ENCOUNTER — Other Ambulatory Visit: Payer: Self-pay | Admitting: Family Medicine

## 2018-01-11 DIAGNOSIS — Z1231 Encounter for screening mammogram for malignant neoplasm of breast: Secondary | ICD-10-CM

## 2018-01-28 ENCOUNTER — Ambulatory Visit
Admission: RE | Admit: 2018-01-28 | Discharge: 2018-01-28 | Disposition: A | Discharge status: Home / Self Care | Payer: Medicare Other | Source: Ambulatory Visit | Attending: Family Medicine | Admitting: Family Medicine

## 2018-01-28 DIAGNOSIS — Z1231 Encounter for screening mammogram for malignant neoplasm of breast: Secondary | ICD-10-CM | POA: Insufficient documentation

## 2018-02-03 ENCOUNTER — Other Ambulatory Visit: Payer: Self-pay | Admitting: Sports Medicine

## 2018-02-04 ENCOUNTER — Other Ambulatory Visit: Payer: Self-pay | Admitting: Sports Medicine

## 2018-02-04 DIAGNOSIS — M25562 Pain in left knee: Secondary | ICD-10-CM

## 2018-02-04 DIAGNOSIS — W19XXXA Unspecified fall, initial encounter: Secondary | ICD-10-CM

## 2018-02-04 DIAGNOSIS — Z96652 Presence of left artificial knee joint: Secondary | ICD-10-CM

## 2018-02-04 DIAGNOSIS — R262 Difficulty in walking, not elsewhere classified: Secondary | ICD-10-CM

## 2018-02-04 DIAGNOSIS — M79662 Pain in left lower leg: Secondary | ICD-10-CM

## 2018-02-16 ENCOUNTER — Encounter
Admission: RE | Admit: 2018-02-16 | Discharge: 2018-02-16 | Disposition: A | Discharge status: Home / Self Care | Payer: Medicare Other | Source: Ambulatory Visit | Attending: Sports Medicine | Admitting: Sports Medicine

## 2018-02-16 ENCOUNTER — Ambulatory Visit
Admission: RE | Admit: 2018-02-16 | Discharge: 2018-02-16 | Disposition: A | Discharge status: Home / Self Care | Payer: Medicare Other | Source: Ambulatory Visit | Attending: Sports Medicine | Admitting: Sports Medicine

## 2018-02-16 DIAGNOSIS — M79662 Pain in left lower leg: Secondary | ICD-10-CM

## 2018-02-16 DIAGNOSIS — S82832A Other fracture of upper and lower end of left fibula, initial encounter for closed fracture: Secondary | ICD-10-CM | POA: Insufficient documentation

## 2018-02-16 DIAGNOSIS — W19XXXA Unspecified fall, initial encounter: Secondary | ICD-10-CM | POA: Insufficient documentation

## 2018-02-16 DIAGNOSIS — Z96652 Presence of left artificial knee joint: Secondary | ICD-10-CM | POA: Diagnosis not present

## 2018-02-16 DIAGNOSIS — R262 Difficulty in walking, not elsewhere classified: Secondary | ICD-10-CM | POA: Insufficient documentation

## 2018-02-16 DIAGNOSIS — M25562 Pain in left knee: Secondary | ICD-10-CM | POA: Diagnosis present

## 2018-02-16 MED ORDER — TECHNETIUM TC 99M MEDRONATE IV KIT
21.9600 | PACK | Freq: Once | INTRAVENOUS | Status: AC | PRN
  Administered 2018-02-16: 21.96 via INTRAVENOUS

## 2018-03-17 IMAGING — US US BREAST*L* LIMITED INC AXILLA
1 series · 13 of 17 positions shown · non-contrast
Comparison: 01/05/2017 and earlier

ACR Breast Density Category a: The breast tissue is almost entirely
fatty.

CLINICAL DATA: The patient returns after screening study for
evaluation of a possible left breast mass.

EXAM:
2D DIGITAL DIAGNOSTIC LEFT MAMMOGRAM WITH CAD AND ADJUNCT TOMO
ULTRASOUND LEFT BREAST

[Series 1: us breast*left* limited inc axilla · 0.06mm/px · 13 of 17 slices shown]
[im 1/17]
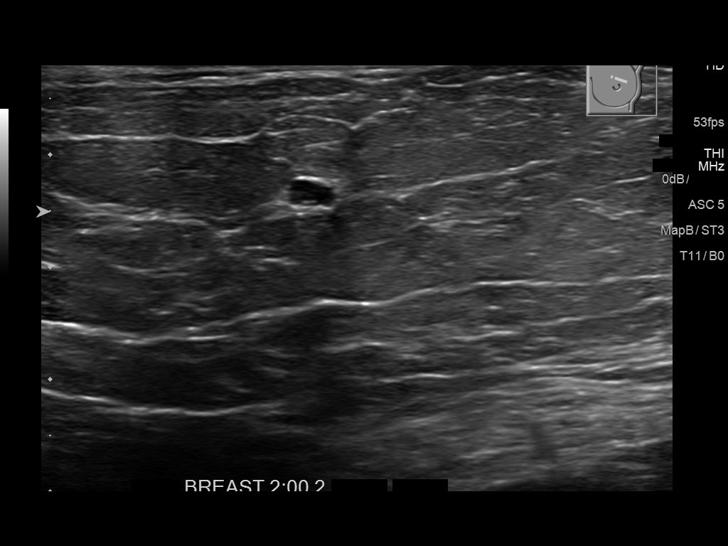
[im 2/17]
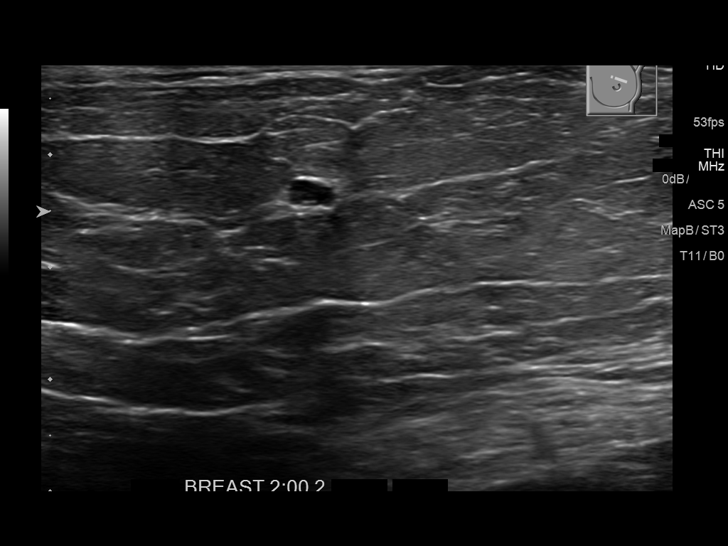
[im 4/17]
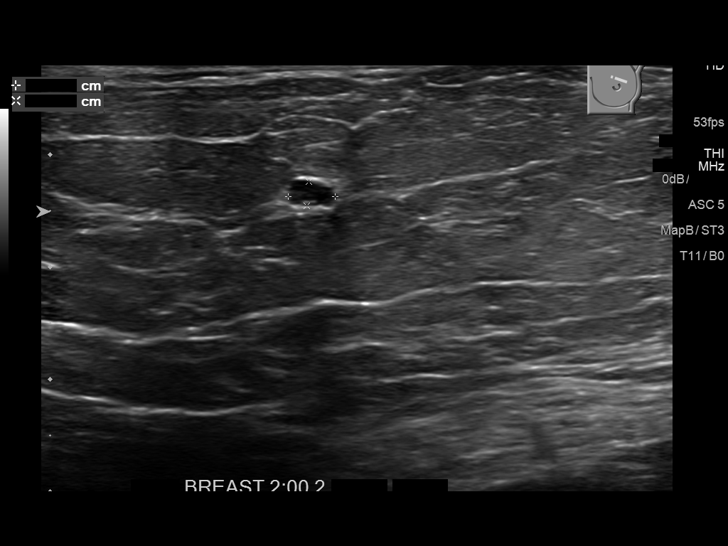
[im 5/17]
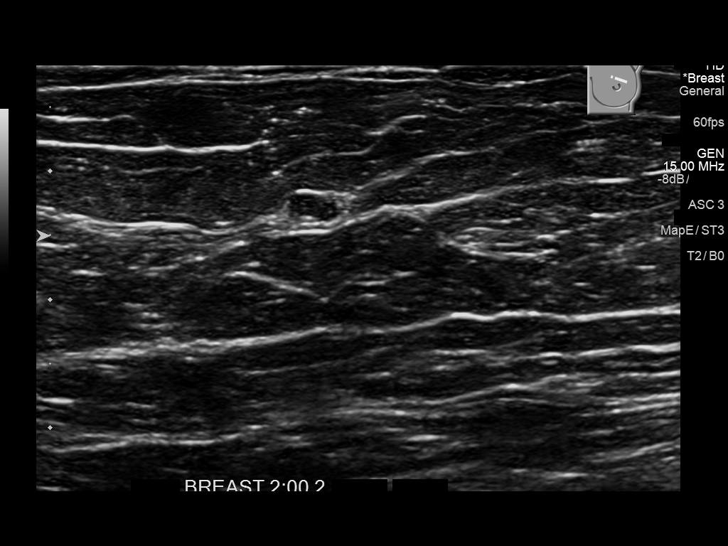
[im 6/17]
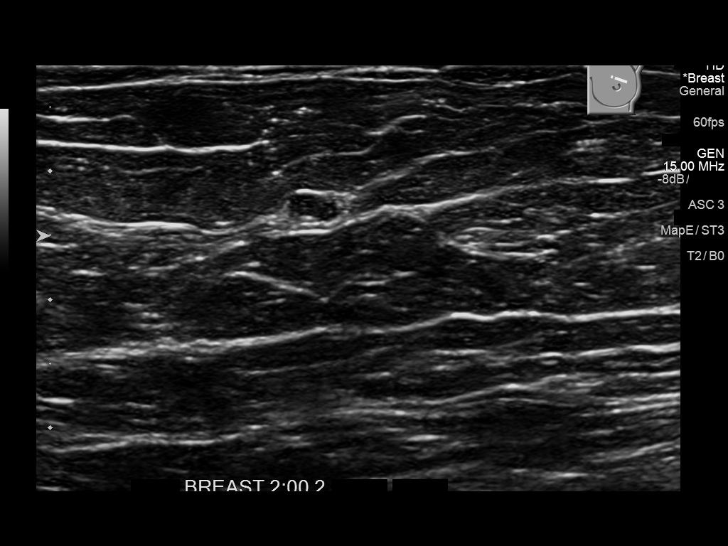
[im 8/17]
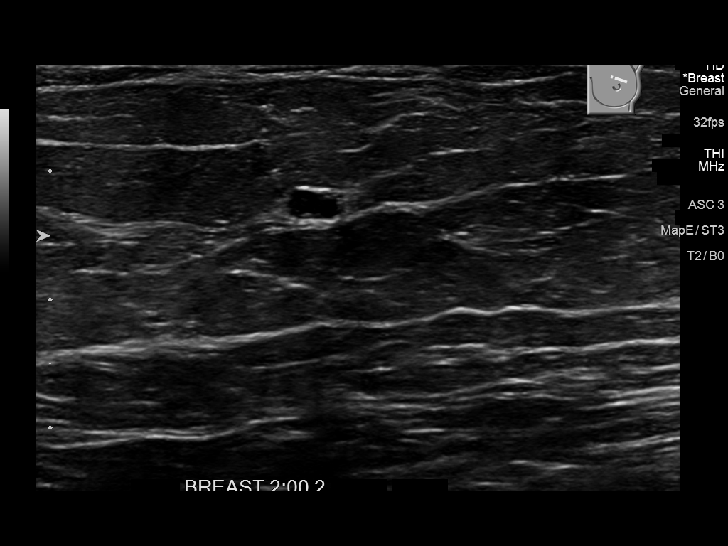
[im 9/17]
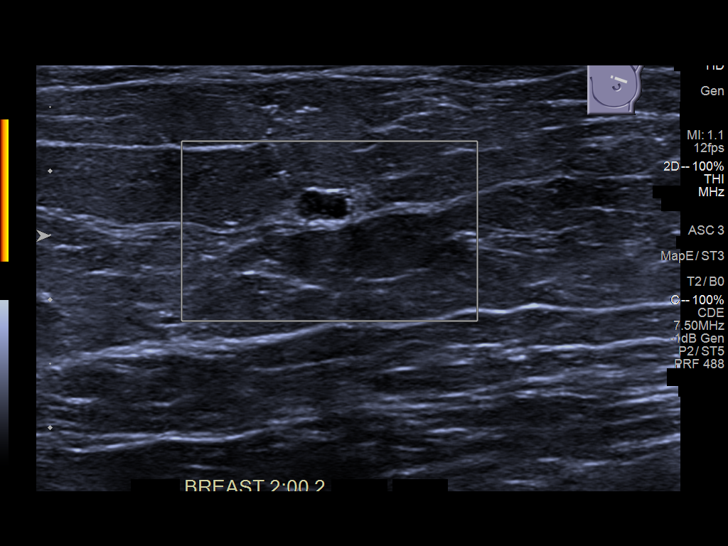
[im 10/17]
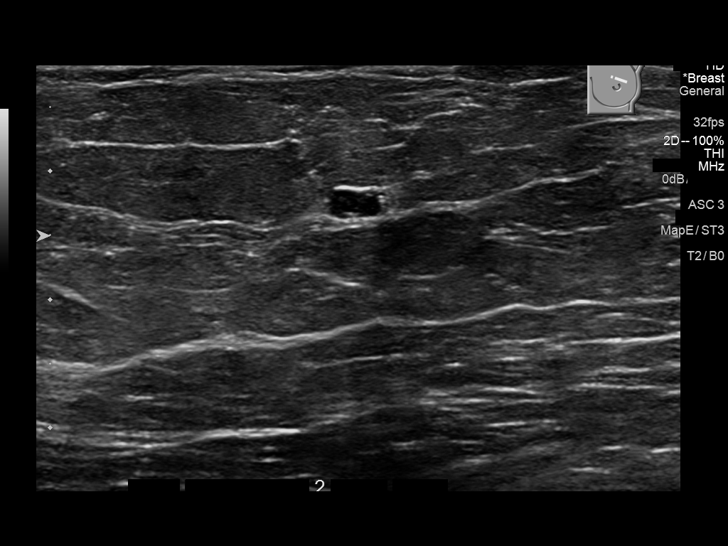
[im 12/17]
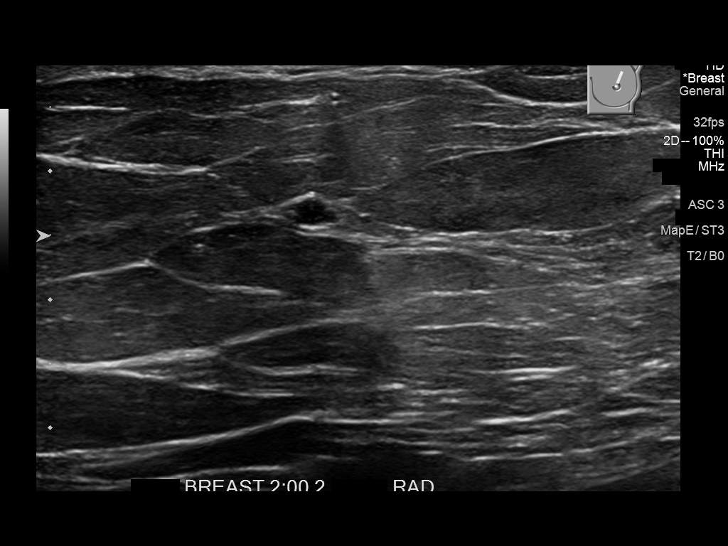
[im 13/17]
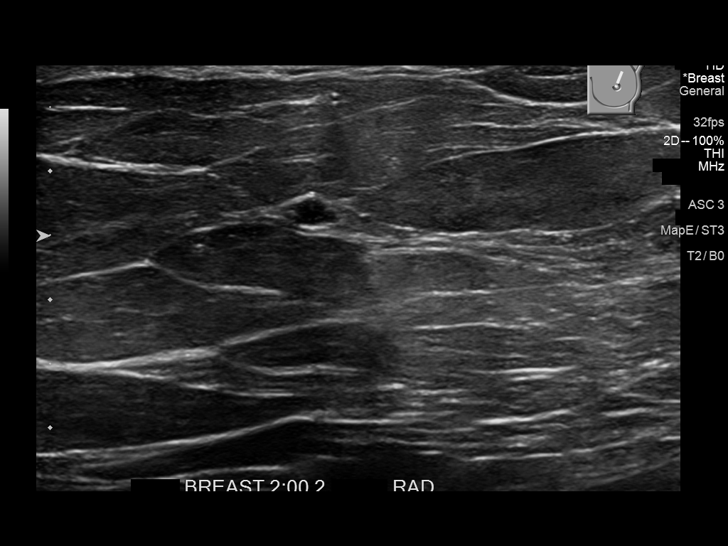
[im 14/17]
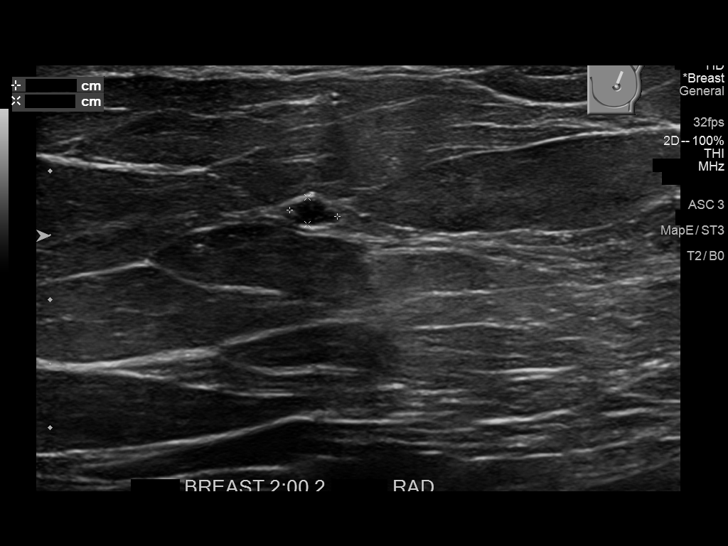
[im 16/17]
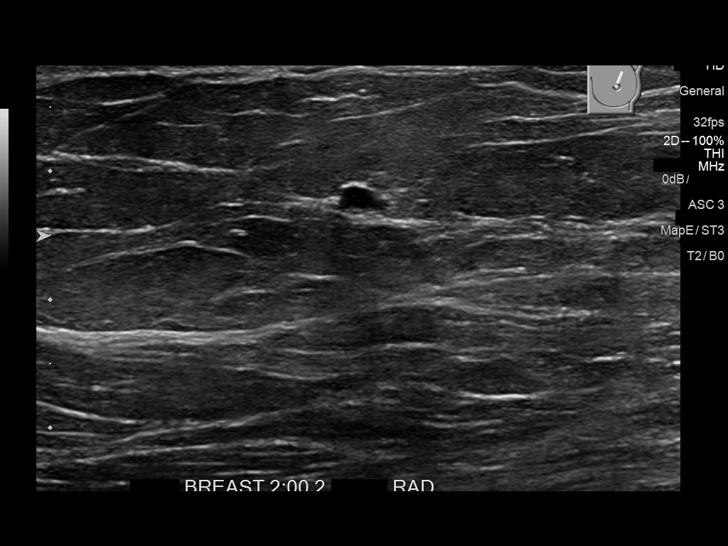
[im 17/17]
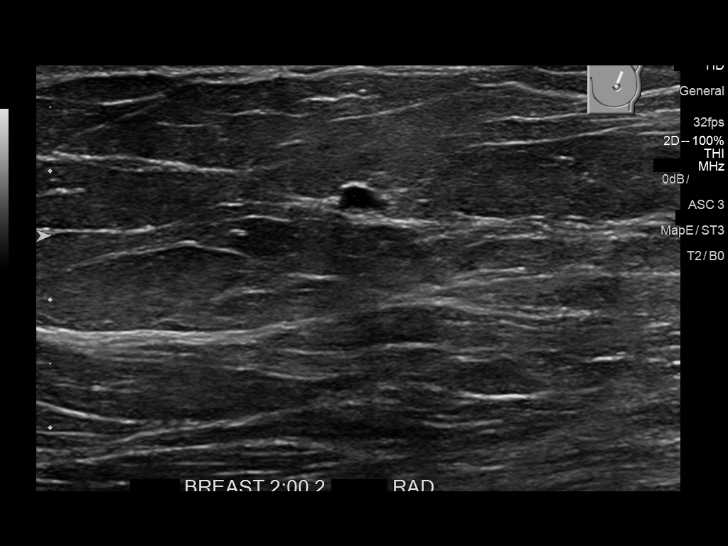

[13 of 17 positions shown; findings below may reference images not displayed]

FINDINGS: Additional 2-D and 3-D images are performed. There is a persistent
round mass with partially obscured margins in the upper-outer
quadrant of the left breast.

Mammographic images were processed with CAD.

Targeted ultrasound is performed, showing a simple cyst in the 2
o'clock location of the left breast 2 cm from the nipple,
superficial in location and measuring 0.4 x 0.2 x 0.2 Cm. No
associated acoustic shadowing or solid component.
IMPRESSION: Persistent abnormality is a simple cyst on further evaluation. No
mammographic or ultrasound evidence for malignancy.

RECOMMENDATION:
Screening mammogram in one year.(Code:[id number])

I have discussed the findings and recommendations with the patient.
Results were also provided in writing at the conclusion of the
visit. If applicable, a reminder letter will be sent to the patient
regarding the next appointment.

BI-RADS CATEGORY  2: Benign.

## 2018-03-20 ENCOUNTER — Other Ambulatory Visit: Payer: Self-pay | Admitting: Pulmonary Disease

## 2018-04-02 ENCOUNTER — Encounter: Payer: Self-pay | Admitting: Pulmonary Disease

## 2018-04-20 ENCOUNTER — Encounter: Payer: Self-pay | Admitting: Pulmonary Disease

## 2018-05-03 ENCOUNTER — Ambulatory Visit: Payer: Medicare Other | Admitting: Pulmonary Disease

## 2018-05-03 ENCOUNTER — Encounter: Payer: Self-pay | Admitting: Pulmonary Disease

## 2018-05-03 VITALS — BP 124/78 | HR 97 | Ht 68.0 in | Wt 252.0 lb

## 2018-05-03 DIAGNOSIS — R05 Cough: Secondary | ICD-10-CM | POA: Diagnosis not present

## 2018-05-03 DIAGNOSIS — R053 Chronic cough: Secondary | ICD-10-CM

## 2018-05-03 MED ORDER — HYDROCOD POLST-CPM POLST ER 10-8 MG/5ML PO SUER: 5.0000 mL | Freq: Every evening | ORAL | 0 refills | Status: DC | PRN

## 2018-05-03 MED ORDER — BENZONATATE 200 MG PO CAPS: 200.0000 mg | ORAL_CAPSULE | Freq: Three times a day (TID) | ORAL | 10 refills | Status: DC | PRN

## 2018-05-03 MED ORDER — DEXTROMETHORPHAN HBR 15 MG/5ML PO SYRP: 10.0000 mL | ORAL_SOLUTION | Freq: Three times a day (TID) | ORAL | 0 refills | Status: DC | PRN

## 2018-05-03 NOTE — Patient Instructions (Addendum)
Tussionex, 5 cc (1 teaspoon) at bedtime as needed for cough Dextromethorphan, 5 cc (1 teaspoon) every 6 hours as needed during day Tessalon Perles, 1 by mouth every 8 hours as needed during the day  Follow-up in 4 to 6 weeks or sooner as needed

## 2018-05-03 NOTE — Progress Notes (Signed)
PULMONARY OFFICE FOLLOW UP NOTE  Requesting MD/Service: Kary Kos Date of initial consultation: 09/09/16 Reason for consultation: Chronic cough  PT PROFILE: 72 y.o. F never smoker with chronic severe cough of > 4 yrs duration, previously evaluated by Dr Chase Caller and Dr Raul Del. She has also been evaluated by ENT Tami Ribas) previously. Despite multiple therapeutic attempts, there have been no interventions or therapies that have alleviated her cough  DATA: PFTs 04/17/14: No obstruction, normal lung volumes, minimal decrease in DLCO CT chest 02/20/15: Stable small pulmonary nodules compatible with a benign process HRCT 11/20/15: Normal CT sinuses 11/20/15: Normal Spirometry 09/09/16: No obstruction Bronchoscopy 10/10/16: nasal septal perforation, otherwise normal upper airway, normal cords, mild bronchitis, minimal clear mucus, no endobronchial masses or foreign bodies. BAL grew < 10k NOF Spirometry 01/06/17: FVC:  2.75L (77%pred), FEV1:  2.14 L (79 %pred), FEV1/FVC: 78% PSG 01/15/17: AHI 10/hr. REM AHI 48/hr CPAP titration 01/28/17: CPAP 12 cm H2O recommended CT chest 02/12/17: minimal pleural based nodular infiltrate in RLL, minimal chronic fibrotic appearing changes in lingula - both stable over 2 yrs duration CXR 10/27/17: NACPD CT maxillofacial 10/27/17: No evidence of sinusitis Bronchoscopy 12/11/17: normal anatomy. Moderate to severe diffuse erythematous and hyperemic mucosal changes which bled very easily with minimal scope trauma. Endobronchial mucosal biopsies obtained. Pathology: RESPIRATORY MUCOSA WITH MILD EOSINOPHILIC STROMAL INFILTRATE. NEGATIVE FOR MALIGNANCY. 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 Nebulized budesonide triggering migraines. Remains miserable. Depomedrol 120 mg IM administered. Tussionex refilled. Referral to [facility] Pulmonary requested PFTs 02/22/18  [facility]): No obstruction, low normal lung volumes, minimally reduced DLCO RAST 02/22/18 [facility]): normal IgE 02/22/18 [facility]): [phone]  SUBJ: She returns today after recent evaluation at [facility].  There were no major changes made to her medical regimen.  It was recommended that she change to [facility] inhaler from Symbicort.  However, she does not like the DPI which exacerbates her cough.  She is presently not on any inhaled corticosteroid.  She is out of Tussionex.  She continues to have daily cough which is nonproductive and continues to adversely affect her quality of life.  She does note that she had a "good period" from mid May to mid June.  Now however her cough is back to previous baseline.  She denies fever, purulent sputum, hemoptysis, nasal congestion, otalgia, chest pain, lower extremity edema, calf tenderness.  OBJ: Vitals:   05/03/18 0933 05/03/18 0939  BP:  124/78  Pulse:  97  SpO2:  98%  Weight: 252 lb (114.3 kg)   Height: _0  (1.727 m)   Room air   EXAM: Gen: Coughing vigorously  HEENT: NCAT, sclerae white, oropharynx normal Neck: No LAN, no JVD noted Lungs: full BS, normal percussion, no wheezes  Cardiovascular: Regular, no M noted Abdomen: Soft, NT, +BS Ext: no C/C/E Neuro: PERRL, EOMI, motor/sensory grossly intact Skin: No lesions noted    DATA:   BMP Latest Ref Rng & Units 06/04/2016 12/30/2014 10/17/2014  Glucose 65 - 99 mg/dL 119(H) 167(H) 140(H)  BUN 6 - 20 mg/dL 23(H) 7 12  Creatinine 0.44 - 1.00 mg/dL 0.89 0.65 0.72  Sodium 135 - 145 mmol/L 128(L) 134(L) 136  Potassium 3.5 - 5.1 mmol/L 4.1 3.2(L) 4.7  Chloride 101 - 111 mmol/L 95(L) 101 105  CO2 22 - 32 mmol/L _1 Calcium 8.9 - 10.3 mg/dL 9.4 8.9 9.7    CBC Latest Ref Rng & Units 06/04/2016 12/30/2014 10/17/2014  WBC 3.6 -  11.0 K/uL 11.9(H) 7.6 9.5  Hemoglobin 12.0 - 16.0 g/dL 13.8 10.7(L) 13.3  Hematocrit 35.0 - 47.0 % 39.6 33.2(L) 40.5  Platelets 150 - 440 K/uL 293 280 274    CXR: No new  film  IMPRESSION:   Chronic severe cough  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  Therefore, we will work on a regimen of cough suppression  PLAN:  Continue (resume) Symbicort inhaler, 2 actuations twice a day Tussionex refilled to be used 1 teaspoon at bedside as needed Dextromethorphan 1 teaspoon every 6 hours as needed during the day Tessalon Perles 1 by mouth every 8 hours as needed during the day Follow-up in 4 to 6 weeks or sooner as needed   Merton Border, MD PCCM service Mobile [phone] Pager [phone] 05/03/2018 1:28 PM

## 2018-06-06 ENCOUNTER — Other Ambulatory Visit: Payer: Self-pay | Admitting: Pulmonary Disease

## 2018-06-08 ENCOUNTER — Ambulatory Visit: Payer: Medicare Other | Admitting: Pulmonary Disease

## 2018-06-08 ENCOUNTER — Ambulatory Visit
Admission: RE | Admit: 2018-06-08 | Discharge: 2018-06-08 | Disposition: A | Discharge status: Home / Self Care | Payer: Medicare Other | Source: Ambulatory Visit | Attending: Pulmonary Disease | Admitting: Pulmonary Disease

## 2018-06-08 ENCOUNTER — Encounter: Payer: Self-pay | Admitting: Pulmonary Disease

## 2018-06-08 ENCOUNTER — Other Ambulatory Visit
Admission: RE | Admit: 2018-06-08 | Discharge: 2018-06-08 | Disposition: A | Discharge status: Home / Self Care | Payer: Medicare Other | Source: Ambulatory Visit | Attending: Pulmonary Disease | Admitting: Pulmonary Disease

## 2018-06-08 VITALS — BP 122/78 | HR 74 | Ht 68.0 in | Wt 252.0 lb

## 2018-06-08 DIAGNOSIS — R768 Other specified abnormal immunological findings in serum: Secondary | ICD-10-CM | POA: Insufficient documentation

## 2018-06-08 DIAGNOSIS — J209 Acute bronchitis, unspecified: Secondary | ICD-10-CM | POA: Diagnosis not present

## 2018-06-08 DIAGNOSIS — R053 Chronic cough: Secondary | ICD-10-CM

## 2018-06-08 DIAGNOSIS — R05 Cough: Secondary | ICD-10-CM

## 2018-06-08 DIAGNOSIS — I700 Atherosclerosis of aorta: Secondary | ICD-10-CM | POA: Insufficient documentation

## 2018-06-08 DIAGNOSIS — J40 Bronchitis, not specified as acute or chronic: Secondary | ICD-10-CM

## 2018-06-08 DIAGNOSIS — D7218 Eosinophilia in diseases classified elsewhere: Secondary | ICD-10-CM

## 2018-06-08 LAB — CBC WITH DIFFERENTIAL/PLATELET
BASOS ABS: 0.1 10*3/uL (ref 0–0.1)
BASOS PCT: 1 %
Eosinophils Absolute: 0.3 10*3/uL (ref 0–0.7)
Eosinophils Relative: 3 %
HEMATOCRIT: 39.9 % (ref 35.0–47.0)
HEMOGLOBIN: 14.1 g/dL (ref 12.0–16.0)
LYMPHS PCT: 20 %
Lymphs Abs: 2.3 10*3/uL (ref 1.0–3.6)
MCH: 32.7 pg (ref 26.0–34.0)
MCHC: 35.3 g/dL (ref 32.0–36.0)
MCV: 92.6 fL (ref 80.0–100.0)
MONO ABS: 0.9 10*3/uL (ref 0.2–0.9)
MONOS PCT: 8 %
NEUTROS ABS: 7.7 10*3/uL — AB (ref 1.4–6.5)
NEUTROS PCT: 68 %
Platelets: 276 10*3/uL (ref 150–440)
RBC: 4.3 MIL/uL (ref 3.80–5.20)
RDW: 14.5 % (ref 11.5–14.5)
WBC: 11.2 10*3/uL — ABNORMAL HIGH (ref 3.6–11.0)

## 2018-06-08 MED ORDER — ALBUTEROL SULFATE HFA 108 (90 BASE) MCG/ACT IN AERS: 1.0000 | INHALATION_SPRAY | Freq: Four times a day (QID) | RESPIRATORY_TRACT | 10 refills | Status: DC | PRN

## 2018-06-08 MED ORDER — HYDROCOD POLST-CPM POLST ER 10-8 MG/5ML PO SUER: 5.0000 mL | Freq: Every evening | ORAL | 0 refills | Status: DC | PRN

## 2018-06-08 MED ORDER — ALBUTEROL SULFATE HFA 108 (90 BASE) MCG/ACT IN AERS: 1.0000 | INHALATION_SPRAY | Freq: Four times a day (QID) | RESPIRATORY_TRACT | 3 refills | Status: DC | PRN

## 2018-06-08 MED ORDER — DOXYCYCLINE HYCLATE 100 MG PO CAPS: 100.0000 mg | ORAL_CAPSULE | Freq: Two times a day (BID) | ORAL | 0 refills | Status: AC

## 2018-06-08 NOTE — Progress Notes (Signed)
PULMONARY OFFICE FOLLOW UP NOTE  Requesting MD/Service: Kary Kos Date of initial consultation: 09/09/16 Reason for consultation: Chronic cough  PT PROFILE: 72 y.o. F never smoker with chronic severe cough of > 4 yrs duration, previously evaluated by Dr Chase Caller and Dr Raul Del. She has also been evaluated by ENT Tami Ribas) previously. Despite multiple therapeutic attempts, there have been no interventions or therapies that have alleviated her cough  DATA: PFTs 04/17/14: No obstruction, normal lung volumes, minimal decrease in DLCO CT chest 02/20/15: Stable small pulmonary nodules compatible with a benign process HRCT 11/20/15: Normal CT sinuses 11/20/15: Normal Spirometry 09/09/16: No obstruction Bronchoscopy 10/10/16: nasal septal perforation, otherwise normal upper airway, normal cords, mild bronchitis, minimal clear mucus, no endobronchial masses or foreign bodies. BAL grew < 10k NOF Spirometry 01/06/17: FVC:  2.75L (77%pred), FEV1:  2.14 L (79 %pred), FEV1/FVC: 78% PSG 01/15/17: AHI 10/hr. REM AHI 48/hr CPAP titration 01/28/17: CPAP 12 cm H2O recommended CT chest 02/12/17: minimal pleural based nodular infiltrate in RLL, minimal chronic fibrotic appearing changes in lingula - both stable over 2 yrs duration CXR 10/27/17: NACPD CT maxillofacial 10/27/17: No evidence of sinusitis Bronchoscopy 12/11/17: normal anatomy. Moderate to severe diffuse erythematous and hyperemic mucosal changes which bled very easily with minimal scope trauma. Endobronchial mucosal biopsies obtained. Pathology: RESPIRATORY MUCOSA WITH MILD EOSINOPHILIC STROMAL INFILTRATE. NEGATIVE FOR MALIGNANCY. 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 Nebulized budesonide triggering migraines. Remains miserable. Depomedrol 120 mg IM administered. Tussionex refilled. Referral to [facility] Pulmonary requested PFTs 02/22/18  [facility]): No obstruction, low normal lung volumes, minimally reduced DLCO RAST 02/22/18 [facility]): normal IgE 02/22/18 [facility]): [phone])  SUBJ: This is a scheduled return visit.  Overall, there is no significant change.  She has not improved with the resumption of Symbicort inhaler.  Nonetheless, she remains on this.  The only thing that gives her relief from her disabling chronic cough is cough suppressants.  Dextromethorphan is modestly beneficial.  Tussionex is significantly beneficial.  At the present time she is out of Tussionex.  Tessalon Perles provide her no discernible benefit.  Of late, she has developed unsteadiness on her feet.  She also notes mild increased shortness of breath and hoarseness.  She has had one episode of what sounds like cough syncope.  Her cough is now rattling but she is unable to expectorate any mucus.   OBJ: Vitals:   06/08/18 0948 06/08/18 0953  BP:  122/78  Pulse:  74  SpO2:  98%  Weight: 252 lb (114.3 kg)   Height: _0  (1.727 m)   Room air   EXAM: Coughing moderately during encounter HEENT: [place] AT, sclerae white, nasopharynx and oropharynx normal Neck: No LN, no JVD Chest: Breath sounds slightly coarse throughout with few scattered rhonchi, no wheezes Cardiovascular: Regular, no M Abdomen: Soft, NT, + BS Extremities: No C/C/E Neuro: No focal deficits    DATA:   BMP Latest Ref Rng & Units 06/04/2016 12/30/2014 10/17/2014  Glucose 65 - 99 mg/dL 119(H) 167(H) 140(H)  BUN 6 - 20 mg/dL 23(H) 7 12  Creatinine 0.44 - 1.00 mg/dL 0.89 0.65 0.72  Sodium 135 - 145 mmol/L 128(L) 134(L) 136  Potassium 3.5 - 5.1 mmol/L 4.1 3.2(L) 4.7  Chloride 101 - 111 mmol/L 95(L) 101 105  CO2 22 - 32 mmol/L _1 Calcium 8.9 - 10.3 mg/dL 9.4 8.9 9.7    CBC Latest Ref Rng & Units 06/08/2018 06/04/2016 12/30/2014  WBC 3.6 - 11.0 K/uL 11.2(H) 11.9(H) 7.6  Hemoglobin 12.0 - 16.0 g/dL 14.1 13.8 10.7(L)  Hematocrit 35.0 - 47.0 % 39.9 39.6 33.2(L)  Platelets 150 - 440  K/uL 276 293 280    CXR 9/3: No acute findings  IMPRESSION:   Chronic disabling cough  Chronic nasal septal perforation 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  Call sooner as needed   Merton Border, MD PCCM service Mobile [phone] Pager [phone] 06/09/2018 12:51 PM

## 2018-06-08 NOTE — Patient Instructions (Addendum)
Chest Xray today Blood tests: IgE level, CBC with differential today Continue (resume) Symbicort inhaler, 2 actuations twice a day Tussionex refilled to be used 1 teaspoon at bedside as needed Continue dextromethorphan 1 teaspoon every 6 hours as needed during the day Doxycycline 100 mg twice a day for 14 days Follow up in 6-8 weeks

## 2018-06-10 LAB — IGE: IgE (Immunoglobulin E), Serum: 926 IU/mL — ABNORMAL HIGH (ref 6–495)

## 2018-06-11 ENCOUNTER — Other Ambulatory Visit: Payer: Self-pay | Admitting: *Deleted

## 2018-06-11 ENCOUNTER — Telehealth: Payer: Self-pay | Admitting: Pulmonary Disease

## 2018-06-11 DIAGNOSIS — R05 Cough: Secondary | ICD-10-CM

## 2018-06-11 DIAGNOSIS — R053 Chronic cough: Secondary | ICD-10-CM

## 2018-06-11 DIAGNOSIS — R768 Other specified abnormal immunological findings in serum: Secondary | ICD-10-CM

## 2018-06-11 NOTE — Telephone Encounter (Signed)
Spoke with Dr. [facility] nurse, Mickel Baas who states that they have seen Dr. Donneta Romberg in the past.  Per Mickel Baas if this referral is to start patient on a biologic medication, then Dr. Donneta Romberg leaves this up to the pulmonary physician to indicate.   Per Mickel Baas with this patient medical history, maybe a referral to one of the teaching hospitals would be the best option for the patient.  Unable to schedule appointment with Dr. Donneta Romberg for this request.  Please advise. Rhonda J Cobb

## 2018-06-22 ENCOUNTER — Other Ambulatory Visit: Payer: Self-pay | Admitting: *Deleted

## 2018-06-22 MED ORDER — BENZONATATE 200 MG PO CAPS: 200.0000 mg | ORAL_CAPSULE | Freq: Three times a day (TID) | ORAL | 10 refills | Status: DC | PRN

## 2018-06-29 ENCOUNTER — Telehealth: Payer: Self-pay | Admitting: Pulmonary Disease

## 2018-06-29 NOTE — Telephone Encounter (Signed)
Tried to call pt but had to LM.

## 2018-06-29 NOTE — Telephone Encounter (Signed)
Patient returning call - patient will be out of home til 4:30p

## 2018-06-29 NOTE — Telephone Encounter (Signed)
Spoke with pt about Xolair forms. Nothing further needed.

## 2018-07-05 ENCOUNTER — Other Ambulatory Visit: Payer: Self-pay | Admitting: Pulmonary Disease

## 2018-07-15 ENCOUNTER — Telehealth: Payer: Self-pay

## 2018-07-15 NOTE — Telephone Encounter (Signed)
Spoke to patient and let her know that [place] was denied through her insurance company. Will check with Dr. Alva Garnet as to what the next step should be and will let her know. Patient aware with no questions at this time.

## 2018-07-21 ENCOUNTER — Encounter: Payer: Self-pay | Admitting: *Deleted

## 2018-07-21 ENCOUNTER — Telehealth: Payer: Self-pay

## 2018-07-21 NOTE — Telephone Encounter (Signed)
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   Please advise on how to proceed.

## 2018-07-23 NOTE — Telephone Encounter (Signed)
Message sent to physician and physician is aware. Phone note closed. Rhonda J Cobb

## 2018-07-26 ENCOUNTER — Telehealth: Payer: Self-pay | Admitting: Pulmonary Disease

## 2018-07-26 ENCOUNTER — Ambulatory Visit: Payer: Medicare Other | Admitting: Pulmonary Disease

## 2018-07-26 NOTE — Telephone Encounter (Signed)
Please call reagarding the PA for Zolair.

## 2018-07-26 NOTE — Telephone Encounter (Signed)
Spoke to Caitlin Leonard, notified her that the patient states she did have the skin test and is trying to get the results to [place]. Mae said she can start to process with just a yes. Faxed back to [facility] and will send the test results when we get them.

## 2018-07-26 NOTE — Telephone Encounter (Signed)
As we discussed... Thank you for continuing to work on this. I think it is a ery important medication for her  Caitlin Leonard

## 2018-07-30 ENCOUNTER — Telehealth: Payer: Self-pay | Admitting: Pulmonary Disease

## 2018-07-30 NOTE — Telephone Encounter (Signed)
Please call regarding denial from insurance

## 2018-07-30 NOTE — Telephone Encounter (Signed)
Spoke to [employer] at [employer]. Stated had been approved through Medicare part D, but denied through secondary insurance. I then called [facility] who stated the PA was approved with approval #[id number] for secondary ins, valid 07/27/18-01/25/18. Called Xolair back to notify them, per Nira Conn they will forward on to speciality pharmacy today and continue the process. Approval to be scanned.

## 2018-08-04 NOTE — Progress Notes (Signed)
Received fax from [facility], patient's prior authorization is approved and has been send to specialty pharmacy. Faxed to [employer] at [place] office for processing.

## 2018-08-10 ENCOUNTER — Telehealth: Payer: Self-pay | Admitting: Pulmonary Disease

## 2018-08-10 ENCOUNTER — Encounter: Payer: Self-pay | Admitting: Pulmonary Disease

## 2018-08-10 ENCOUNTER — Ambulatory Visit: Payer: Medicare Other | Admitting: Pulmonary Disease

## 2018-08-10 VITALS — BP 130/88 | HR 71 | Ht 68.0 in | Wt 252.4 lb

## 2018-08-10 DIAGNOSIS — R768 Other specified abnormal immunological findings in serum: Secondary | ICD-10-CM

## 2018-08-10 DIAGNOSIS — R053 Chronic cough: Secondary | ICD-10-CM

## 2018-08-10 DIAGNOSIS — R05 Cough: Secondary | ICD-10-CM

## 2018-08-10 MED ORDER — HYDROCOD POLST-CPM POLST ER 10-8 MG/5ML PO SUER: 5.0000 mL | Freq: Every evening | ORAL | 0 refills | Status: DC | PRN

## 2018-08-10 NOTE — Telephone Encounter (Signed)
[facility] calling needing a call back  Need to schedule a time for delivery for patient xolair inhaler  Please call back

## 2018-08-10 NOTE — Patient Instructions (Addendum)
Lets get the Xolair started as soon as possible Continue everything else as previously prescribed Tussionex refilled Follow-up in 6 weeks.  Call sooner if needed

## 2018-08-10 NOTE — Progress Notes (Signed)
PULMONARY OFFICE FOLLOW UP NOTE  Requesting MD/Service: Kary Kos Date of initial consultation: 09/09/16 Reason for consultation: Chronic cough  PT PROFILE: 72 y.o. F never smoker with chronic severe cough of > 4 yrs duration, previously evaluated by Dr Chase Caller and Dr Raul Del. She has also been evaluated by ENT Tami Ribas) previously. Despite multiple therapeutic attempts, there have been no interventions or therapies that have alleviated her cough  DATA: PFTs 04/17/14: No obstruction, normal lung volumes, minimal decrease in DLCO CT chest 02/20/15: Stable small pulmonary nodules compatible with a benign process HRCT 11/20/15: Normal CT sinuses 11/20/15: Normal Spirometry 09/09/16: No obstruction Bronchoscopy 10/10/16: nasal septal perforation, otherwise normal upper airway, normal cords, mild bronchitis, minimal clear mucus, no endobronchial masses or foreign bodies. BAL grew < 10k NOF Spirometry 01/06/17: FVC:  2.75L (77%pred), FEV1:  2.14 L (79 %pred), FEV1/FVC: 78% PSG 01/15/17: AHI 10/hr. REM AHI 48/hr CPAP titration 01/28/17: CPAP 12 cm H2O recommended CT chest 02/12/17: minimal pleural based nodular infiltrate in RLL, minimal chronic fibrotic appearing changes in lingula - both stable over 2 yrs duration CXR 10/27/17: NACPD CT maxillofacial 10/27/17: No evidence of sinusitis Bronchoscopy 12/11/17: normal anatomy. Moderate to severe diffuse erythematous and hyperemic mucosal changes which bled very easily with minimal scope trauma. Endobronchial mucosal biopsies obtained. Pathology: RESPIRATORY MUCOSA WITH MILD EOSINOPHILIC STROMAL INFILTRATE. NEGATIVE FOR MALIGNANCY. 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 Nebulized budesonide triggering migraines. Remains miserable. Depomedrol 120 mg IM administered. Tussionex refilled. Referral to [facility] Pulmonary requested PFTs 02/22/18  [facility]): No obstruction, low normal lung volumes, minimally reduced DLCO RAST 02/22/18 [facility]): normal IgE 02/22/18 [facility]): [phone])  SUBJ: This is a scheduled return visit.  This was intended to be a follow-up to see how she is doing after initiation of Xolair.  Unfortunately, we have had difficulty getting that medication approved and it is only recently been approved.  She has not received any doses of yet.  Overall she reports no changes in her chronic cough.  She has no new symptoms.   OBJ: Vitals:   08/10/18 0904 08/10/18 0905  BP:  130/88  Pulse:  71  SpO2:  100%  Weight: 252 lb 6.4 oz (114.5 kg)   Height: _0  (1.727 m)   Room air   EXAM: NAD.  Mild cough during this brief encounter HEENT: NCAT, sclerae white Neck: No LN, no JVD Chest: BS slightly coarse, no true wheezes Cardiovascular: Regular, no M Abdomen: Soft, NT, + BS Extremities: No C/C/E Neuro: No focal deficits    DATA:   BMP Latest Ref Rng & Units 06/04/2016 12/30/2014 10/17/2014  Glucose 65 - 99 mg/dL 119(H) 167(H) 140(H)  BUN 6 - 20 mg/dL 23(H) 7 12  Creatinine 0.44 - 1.00 mg/dL 0.89 0.65 0.72  Sodium 135 - 145 mmol/L 128(L) 134(L) 136  Potassium 3.5 - 5.1 mmol/L 4.1 3.2(L) 4.7  Chloride 101 - 111 mmol/L 95(L) 101 105  CO2 22 - 32 mmol/L _1 Calcium 8.9 - 10.3 mg/dL 9.4 8.9 9.7    CBC Latest Ref Rng & Units 06/08/2018 06/04/2016 12/30/2014  WBC 3.6 - 11.0 K/uL 11.2(H) 11.9(H) 7.6  Hemoglobin 12.0 - 16.0 g/dL 14.1 13.8 10.7(L)  Hematocrit 35.0 - 47.0 % 39.9 39.6 33.2(L)  Platelets 150 - 440 K/uL 276 293 280    CXR: No new film  IMPRESSION:   Chronic disabling cough  Chronic nasal septal perforation 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   Call sooner if needed   Merton Border, MD PCCM service Mobile [phone] Pager [phone] 08/10/2018 10:59 AM

## 2018-08-10 NOTE — Telephone Encounter (Signed)
Spoke with [employer] at [place] to schedule delivery of Xolair. Medication scheduled to ship on 08/12/18. Pt is aware. Please call to schedule appt.

## 2018-08-10 NOTE — Telephone Encounter (Signed)
Prefilled Syringe: #150mg  4  #75mg  2 Ordered Date: 08/10/18 Shipping Date: 08/11/18

## 2018-08-12 NOTE — Telephone Encounter (Signed)
#   Vials:6 Arrival Date:08/12/18 Lot #:[id number] Exp Date:07/2021

## 2018-08-13 ENCOUNTER — Ambulatory Visit (INDEPENDENT_AMBULATORY_CARE_PROVIDER_SITE_OTHER): Payer: Medicare Other

## 2018-08-13 DIAGNOSIS — J4540 Moderate persistent asthma, uncomplicated: Secondary | ICD-10-CM | POA: Diagnosis not present

## 2018-08-13 MED ORDER — OMALIZUMAB 150 MG ~~LOC~~ SOLR
300.0000 mg | SUBCUTANEOUS | Status: DC
  Administered 2018-08-13 – 2018-08-27 (×2): 300 mg via SUBCUTANEOUS

## 2018-08-13 NOTE — Progress Notes (Signed)
Documentation of medication administration and charges of Xolair have been completed by Lindsay Lemons, CMA based on the hand written Xolair documentation sheet completed by Tammy Scott, who administered the medication.  

## 2018-08-27 ENCOUNTER — Ambulatory Visit (INDEPENDENT_AMBULATORY_CARE_PROVIDER_SITE_OTHER): Payer: Medicare Other

## 2018-08-27 DIAGNOSIS — J4540 Moderate persistent asthma, uncomplicated: Secondary | ICD-10-CM | POA: Diagnosis not present

## 2018-08-28 ENCOUNTER — Other Ambulatory Visit: Payer: Self-pay | Admitting: Gastroenterology

## 2018-08-29 ENCOUNTER — Other Ambulatory Visit: Payer: Self-pay | Admitting: Pulmonary Disease

## 2018-08-29 ENCOUNTER — Other Ambulatory Visit: Payer: Self-pay | Admitting: Cardiovascular Disease

## 2018-08-30 NOTE — Telephone Encounter (Signed)
I will have scheduling contact patient to make a follow up appointment.

## 2018-09-01 NOTE — Progress Notes (Signed)
Documented by Parke Poisson CMA based on hand-written Xolair documentation sheet completed by [facility] CMA, who administered the medication.   Patient answered the following questions per the hand-written documentation on the Xolair Injection sheet completed by Alroy Bailiff CMA, who administered the medication:  Have you had a change in your insurance?  NO. Have you been in the hospital in the past 10 days?  NO. Do you have a fever?  NO. Do you have a cough?  YES.  Dry, sometimes productive.

## 2018-09-09 ENCOUNTER — Telehealth: Payer: Self-pay | Admitting: Pulmonary Disease

## 2018-09-09 NOTE — Telephone Encounter (Signed)
#   Vials:6 Arrival Date:09/09/18 Lot #:[id number] Exp Date:07/2021

## 2018-09-10 ENCOUNTER — Ambulatory Visit (INDEPENDENT_AMBULATORY_CARE_PROVIDER_SITE_OTHER): Payer: Medicare Other

## 2018-09-10 DIAGNOSIS — J4540 Moderate persistent asthma, uncomplicated: Secondary | ICD-10-CM | POA: Diagnosis not present

## 2018-09-10 MED ORDER — OMALIZUMAB 150 MG ~~LOC~~ SOLR
375.0000 mg | SUBCUTANEOUS | Status: DC
  Administered 2018-09-10: 375 mg via SUBCUTANEOUS

## 2018-09-10 MED ORDER — OMALIZUMAB 150 MG ~~LOC~~ SOLR: 150.0000 mg | SUBCUTANEOUS | Status: DC

## 2018-09-10 NOTE — Progress Notes (Signed)
Documentation of medication administration and charges of Xolair have been completed by Jerri Hargadon, CMA based on the hand written Xolair documentation sheet completed by Tammy Scott, who administered the medication.  

## 2018-09-21 ENCOUNTER — Other Ambulatory Visit: Payer: Self-pay | Admitting: Family Medicine

## 2018-09-21 ENCOUNTER — Ambulatory Visit
Admission: RE | Admit: 2018-09-21 | Discharge: 2018-09-21 | Disposition: A | Discharge status: Home / Self Care | Payer: Medicare Other | Attending: Pulmonary Disease | Admitting: Pulmonary Disease

## 2018-09-21 ENCOUNTER — Encounter: Payer: Self-pay | Admitting: Pulmonary Disease

## 2018-09-21 ENCOUNTER — Ambulatory Visit
Admission: RE | Admit: 2018-09-21 | Discharge: 2018-09-21 | Disposition: A | Discharge status: Home / Self Care | Payer: Medicare Other | Source: Ambulatory Visit | Attending: Pulmonary Disease | Admitting: Pulmonary Disease

## 2018-09-21 ENCOUNTER — Ambulatory Visit: Payer: Medicare Other | Admitting: Pulmonary Disease

## 2018-09-21 ENCOUNTER — Other Ambulatory Visit
Admission: RE | Admit: 2018-09-21 | Discharge: 2018-09-21 | Disposition: A | Discharge status: Home / Self Care | Payer: Medicare Other | Source: Ambulatory Visit | Attending: Pulmonary Disease | Admitting: Pulmonary Disease

## 2018-09-21 VITALS — BP 122/82 | HR 68 | Ht 68.0 in | Wt 255.0 lb

## 2018-09-21 DIAGNOSIS — R0609 Other forms of dyspnea: Secondary | ICD-10-CM | POA: Insufficient documentation

## 2018-09-21 DIAGNOSIS — R05 Cough: Secondary | ICD-10-CM | POA: Diagnosis not present

## 2018-09-21 DIAGNOSIS — I712 Thoracic aortic aneurysm, without rupture, unspecified: Secondary | ICD-10-CM

## 2018-09-21 DIAGNOSIS — R768 Other specified abnormal immunological findings in serum: Secondary | ICD-10-CM

## 2018-09-21 DIAGNOSIS — R053 Chronic cough: Secondary | ICD-10-CM

## 2018-09-21 DIAGNOSIS — R600 Localized edema: Secondary | ICD-10-CM

## 2018-09-21 LAB — BRAIN NATRIURETIC PEPTIDE: B NATRIURETIC PEPTIDE 5: 37 pg/mL (ref 0.0–100.0)

## 2018-09-21 NOTE — Progress Notes (Signed)
PULMONARY OFFICE FOLLOW UP NOTE  Requesting MD/Service: Kary Kos Date of initial consultation: 09/09/16 Reason for consultation: Chronic cough  PT PROFILE: 72 y.o. F never smoker with chronic severe cough of > 4 yrs duration, previously evaluated by Dr Chase Caller and Dr Raul Del. She has also been evaluated by ENT Tami Ribas) previously. Despite multiple therapeutic attempts, there have been no interventions or therapies that have alleviated her cough  DATA: PFTs 04/17/14: No obstruction, normal lung volumes, minimal decrease in DLCO CT chest 02/20/15: Stable small pulmonary nodules compatible with a benign process HRCT 11/20/15: Normal CT sinuses 11/20/15: Normal Spirometry 09/09/16: No obstruction Bronchoscopy 10/10/16: nasal septal perforation, otherwise normal upper airway, normal cords, mild bronchitis, minimal clear mucus, no endobronchial masses or foreign bodies. BAL grew < 10k NOF Spirometry 01/06/17: FVC:  2.75L (77%pred), FEV1:  2.14 L (79 %pred), FEV1/FVC: 78% PSG 01/15/17: AHI 10/hr. REM AHI 48/hr CPAP titration 01/28/17: CPAP 12 cm H2O recommended CT chest 02/12/17: minimal pleural based nodular infiltrate in RLL, minimal chronic fibrotic appearing changes in lingula - both stable over 2 yrs duration CXR 10/27/17: NACPD CT maxillofacial 10/27/17: No evidence of sinusitis Bronchoscopy 12/11/17: normal anatomy. Moderate to severe diffuse erythematous and hyperemic mucosal changes which bled very easily with minimal scope trauma. Endobronchial mucosal biopsies obtained. Pathology: RESPIRATORY MUCOSA WITH MILD EOSINOPHILIC STROMAL INFILTRATE. NEGATIVE FOR MALIGNANCY. 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 Nebulized budesonide triggering migraines. Remains miserable. Depomedrol 120 mg IM administered. Tussionex refilled. Referral to [facility] Pulmonary requested PFTs 02/22/18  [facility]): No obstruction, low normal lung volumes, minimally reduced DLCO RAST 02/22/18 [facility]): normal IgE 02/22/18 [facility]): 394 [phone]) IgE 06/08/18: 926 08/2018: Xolair initiated   SUBJ: This is a scheduled return visit.  She feels that her cough is much improved after initiating Xolair - she estimates approximately 90% resolved.  However, she reports worsened exertional dyspnea.  She has also suffered 2 falls unrelated to cough or dyspnea.  She reports weakness in her left lower extremity.  Otherwise, she is pleased with the improvement in her cough and has no new complaints.  She denies CP, fever, purulent sputum, hemoptysis, LE edema and calf tenderness.   OBJ: Vitals:   09/21/18 0926 09/21/18 0929  BP:  122/82  Pulse:  68  SpO2:  94%  Weight: 255 lb (115.7 kg)   Height: _0  (1.727 m)   Room air   EXAM: NAD HEENT: NCAT, sclerae white, nasal perforation noted Neck: No JVD, no LN Chest: Breath sounds full without wheezes.  Faint bibasilar crackles Cardiovascular: Regular, no M Abdomen: Soft, NT, NABS Extremities: Symmetric pretibial and ankle edema, minimally pitting Neuro: No focal deficits noted on limited exam    DATA:   BMP Latest Ref Rng & Units 06/04/2016 12/30/2014 10/17/2014  Glucose 65 - 99 mg/dL 119(H) 167(H) 140(H)  BUN 6 - 20 mg/dL 23(H) 7 12  Creatinine 0.44 - 1.00 mg/dL 0.89 0.65 0.72  Sodium 135 - 145 mmol/L 128(L) 134(L) 136  Potassium 3.5 - 5.1 mmol/L 4.1 3.2(L) 4.7  Chloride 101 - 111 mmol/L 95(L) 101 105  CO2 22 - 32 mmol/L _1 Calcium 8.9 - 10.3 mg/dL 9.4 8.9 9.7    CBC Latest Ref Rng & Units 06/08/2018 06/04/2016 12/30/2014  WBC 3.6 - 11.0 K/uL 11.2(H) 11.9(H) 7.6  Hemoglobin 12.0 - 16.0 g/dL 14.1 13.8 10.7(L)  Hematocrit 35.0 - 47.0 % 39.9 39.6 33.2(L)  Platelets 150 - 440 K/uL  276 293 280    CXR 12/17: No acute findings  IMPRESSION:   Chronic cough  Chronic nasal septal perforation 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

## 2018-09-21 NOTE — Patient Instructions (Addendum)
Continue Xolair and inhaler medications as previously prescribed To evaluate increased shortness of breath the following tests have been ordered: 1) B natruretic peptide (blood test) 2) chest x-ray.  Please get this today 3) lower extremity venous ultrasound to look for blood clots  We will contact you with results of the above tests.  Follow-up 3 months.  Call sooner if needed.

## 2018-09-24 ENCOUNTER — Ambulatory Visit (INDEPENDENT_AMBULATORY_CARE_PROVIDER_SITE_OTHER): Payer: Medicare Other

## 2018-09-24 DIAGNOSIS — J4540 Moderate persistent asthma, uncomplicated: Secondary | ICD-10-CM

## 2018-09-24 MED ORDER — OMALIZUMAB 150 MG ~~LOC~~ SOLR
375.0000 mg | Freq: Once | SUBCUTANEOUS | Status: AC
  Administered 2018-09-24: 375 mg via SUBCUTANEOUS

## 2018-09-25 ENCOUNTER — Other Ambulatory Visit: Payer: Self-pay | Admitting: Pulmonary Disease

## 2018-10-05 ENCOUNTER — Telehealth: Payer: Self-pay | Admitting: Pulmonary Disease

## 2018-10-05 ENCOUNTER — Ambulatory Visit
Admission: RE | Admit: 2018-10-05 | Discharge: 2018-10-05 | Disposition: A | Discharge status: Home / Self Care | Payer: Medicare Other | Source: Ambulatory Visit | Attending: Pulmonary Disease | Admitting: Pulmonary Disease

## 2018-10-05 DIAGNOSIS — R600 Localized edema: Secondary | ICD-10-CM | POA: Insufficient documentation

## 2018-10-05 DIAGNOSIS — R0600 Dyspnea, unspecified: Secondary | ICD-10-CM

## 2018-10-05 NOTE — Telephone Encounter (Signed)
#   vials:6 Ordered date:10/05/18 Shipping Date:1/3/120

## 2018-10-07 ENCOUNTER — Ambulatory Visit
Admission: RE | Admit: 2018-10-07 | Discharge: 2018-10-07 | Disposition: A | Discharge status: Home / Self Care | Payer: Medicare Other | Source: Ambulatory Visit | Attending: Family Medicine | Admitting: Family Medicine

## 2018-10-07 DIAGNOSIS — I712 Thoracic aortic aneurysm, without rupture, unspecified: Secondary | ICD-10-CM

## 2018-10-07 NOTE — Telephone Encounter (Signed)
Called patient to let her know results of CXR, BNP and Venous US were all normal. Let her know Dr. Alva Garnet wants PFT before next apt. Order entered. Patient aware. Nothing further at this time.

## 2018-10-07 NOTE — Telephone Encounter (Signed)
-----   Message from [name], MD sent at 10/07/2018  8:34 AM EST ----- Let her know that CXR, venous US and blood test (BNP) were all normal. Please schedule PFTs to be performed prior to 3 month follow up visit   Thanks  Waunita Schooner

## 2018-10-08 ENCOUNTER — Ambulatory Visit (INDEPENDENT_AMBULATORY_CARE_PROVIDER_SITE_OTHER): Payer: Medicare Other

## 2018-10-08 DIAGNOSIS — J4540 Moderate persistent asthma, uncomplicated: Secondary | ICD-10-CM

## 2018-10-08 MED ORDER — OMALIZUMAB 150 MG ~~LOC~~ SOLR
375.0000 mg | Freq: Once | SUBCUTANEOUS | Status: AC
  Administered 2018-10-08: 375 mg via SUBCUTANEOUS

## 2018-10-08 NOTE — Telephone Encounter (Signed)
#   Vials:6 Arrival Date:10/08/18 Lot #:[id number] Exp Date:06/2022

## 2018-10-21 ENCOUNTER — Emergency Department: Payer: Medicare Other

## 2018-10-21 ENCOUNTER — Emergency Department
Admission: EM | Admit: 2018-10-21 | Discharge: 2018-10-21 | Disposition: A | Discharge status: Home / Self Care | Payer: Medicare Other | Attending: Emergency Medicine | Admitting: Emergency Medicine

## 2018-10-21 ENCOUNTER — Encounter: Payer: Self-pay | Admitting: *Deleted

## 2018-10-21 DIAGNOSIS — Z79899 Other long term (current) drug therapy: Secondary | ICD-10-CM | POA: Insufficient documentation

## 2018-10-21 DIAGNOSIS — F419 Anxiety disorder, unspecified: Secondary | ICD-10-CM | POA: Insufficient documentation

## 2018-10-21 DIAGNOSIS — Z91010 Allergy to peanuts: Secondary | ICD-10-CM | POA: Diagnosis not present

## 2018-10-21 DIAGNOSIS — I10 Essential (primary) hypertension: Secondary | ICD-10-CM | POA: Insufficient documentation

## 2018-10-21 DIAGNOSIS — J45909 Unspecified asthma, uncomplicated: Secondary | ICD-10-CM | POA: Diagnosis not present

## 2018-10-21 DIAGNOSIS — Y998 Other external cause status: Secondary | ICD-10-CM | POA: Diagnosis not present

## 2018-10-21 DIAGNOSIS — F329 Major depressive disorder, single episode, unspecified: Secondary | ICD-10-CM | POA: Insufficient documentation

## 2018-10-21 DIAGNOSIS — Y9389 Activity, other specified: Secondary | ICD-10-CM | POA: Insufficient documentation

## 2018-10-21 DIAGNOSIS — S99911A Unspecified injury of right ankle, initial encounter: Secondary | ICD-10-CM | POA: Diagnosis present

## 2018-10-21 DIAGNOSIS — Z96652 Presence of left artificial knee joint: Secondary | ICD-10-CM | POA: Diagnosis not present

## 2018-10-21 DIAGNOSIS — S93401A Sprain of unspecified ligament of right ankle, initial encounter: Secondary | ICD-10-CM | POA: Insufficient documentation

## 2018-10-21 DIAGNOSIS — Z9049 Acquired absence of other specified parts of digestive tract: Secondary | ICD-10-CM | POA: Diagnosis not present

## 2018-10-21 DIAGNOSIS — W010XXA Fall on same level from slipping, tripping and stumbling without subsequent striking against object, initial encounter: Secondary | ICD-10-CM | POA: Diagnosis not present

## 2018-10-21 DIAGNOSIS — Y92129 Unspecified place in nursing home as the place of occurrence of the external cause: Secondary | ICD-10-CM | POA: Insufficient documentation

## 2018-10-21 MED ORDER — TRAMADOL HCL 50 MG PO TABS
50.0000 mg | ORAL_TABLET | Freq: Once | ORAL | Status: AC
  Administered 2018-10-21: 50 mg via ORAL
  Filled 2018-10-21: qty 1

## 2018-10-21 MED ORDER — TRAMADOL HCL 50 MG PO TABS: 50.0000 mg | ORAL_TABLET | Freq: Four times a day (QID) | ORAL | 0 refills | Status: DC | PRN

## 2018-10-21 NOTE — Discharge Instructions (Addendum)
Follow-up with Dr. Mack Guise.  Please call for appointment.  Use your walker and bear weight as tolerated

## 2018-10-21 NOTE — ED Provider Notes (Signed)
[facility] Emergency Department Provider Note  ____________________________________________   First MD Initiated Contact with Patient 10/21/18 1501     (approximate)  I have reviewed the triage vital signs and the nursing notes.   HISTORY  Chief Complaint Ankle Injury    HPI Caitlin Leonard is a 73 y.o. female's emergency department complaining of right foot and ankle pain.  She states that she fell today and the foot went underneath her.  She states it is very painful to bear weight.  She lives at [facility] in the independent living.  She denies any other injuries.  She denies any head injury or LOC    Past Medical History:  Diagnosis Date  . Anxiety   . Arthritis    osteoarthritis  . Asthma    due to seasonal alllergies  . Cataract   . Collagenous colitis 2010  . Depression    situational  . Dysrhythmia   . GERD (gastroesophageal reflux disease)   . Headache(784.0)    migraines; 2 x month  . Heart murmur    MVP ( symptomatic)  . History of IBS   . Hyperlipemia   . Hypertension    on medication x 10 years  . Shortness of breath    exertional  . Sleep apnea 2007   does not use CPAP since 40 # wt loss  . Syncopal episodes     Patient Active Problem List   Diagnosis Date Noted  . Leg swelling 03/19/2016  . Essential hypertension 03/19/2016  . Obesity 09/10/2015  . Bronchitis, acute 04/11/2015  . OSA (obstructive sleep apnea) 10/18/2014  . Nodule of right lung 06/14/2014  . Dyspnea 06/13/2014  . Coronary artery calcification 04/17/2014  . Aneurysm, ascending aorta ([place]) 04/17/2014  . Chronic cough 02/24/2014  . Mold suspected exposure 02/24/2014    Past Surgical History:  Procedure Laterality Date  . ABDOMINAL HYSTERECTOMY  1979  . BACK SURGERY    . BREAST CYST ASPIRATION Right 25 plus yrs ago  . CARDIAC CATHETERIZATION  2012   [facility]  . CHOLECYSTECTOMY  2011  . FLEXIBLE BRONCHOSCOPY N/A 10/10/2016   Procedure: FLEXIBLE  BRONCHOSCOPY;  Surgeon: [name], MD;  Location: [facility] ORS;  Service: Pulmonary;  Laterality: N/A;  . FLEXIBLE BRONCHOSCOPY N/A 12/11/2017   Procedure: FLEXIBLE BRONCHOSCOPY;  Surgeon: [name], MD;  Location: [facility] ORS;  Service: Pulmonary;  Laterality: N/A;  . [place], 2012 x 2   bilateral  . LUMBAR LAMINECTOMY  0354,6568  . NASAL SINUS SURGERY  1994  . TONSILLECTOMY    . TOTAL KNEE ARTHROPLASTY  12/22/2011   Procedure: TOTAL KNEE ARTHROPLASTY;  Surgeon: Rudean Haskell, MD;  Location: [place];  Service: Orthopedics;  Laterality: Left;    Prior to Admission medications   Medication Sig Start Date End Date Taking? Authorizing Provider  albuterol (PROVENTIL HFA;VENTOLIN HFA) 108 (90 Base) MCG/ACT inhaler Inhale 1-2 puffs into the lungs every 6 (six) hours as needed for wheezing or shortness of breath. 06/08/18   [name], MD  ALPRAZolam Duanne Moron) 0.5 MG tablet Take 0.5 mg by mouth at bedtime.     [provider]  aspirin EC 81 MG tablet Take 81 mg by mouth daily.    [provider]  benzonatate (TESSALON) 200 MG capsule Take 1 capsule (200 mg total) by mouth 3 (three) times daily as needed for cough. 06/22/18   [name], MD  Biotin 1000 MCG  tablet Take 1,000 mcg by mouth 3 (three) times daily.    [provider]  budesonide (ENTOCORT EC) 3 MG 24 hr capsule TAKE 1 CAPSULE(3 MG) BY MOUTH DAILY 09/30/17   Milus Banister, MD  budesonide-formoterol [facility]) 160-4.5 MCG/ACT inhaler Inhale 2 puffs into the lungs 2 (two) times daily.    [provider]  carboxymethylcellulose (REFRESH PLUS) 0.5 % SOLN Place 1 drop into both eyes 2 (two) times daily.     [provider]  CARTIA XT 180 MG 24 hr capsule TAKE 1 CAPSULE(180 MG) BY MOUTH DAILY 02/07/16   Minna Merritts, MD  cetirizine (ZYRTEC) 10 MG tablet TAKE 1 TABLET(10 MG) BY MOUTH AT BEDTIME 09/27/18   [name], MD  chlorpheniramine-HYDROcodone  (TUSSIONEX PENNKINETIC ER) 10-8 MG/5ML SUER Take 5 mLs by mouth at bedtime as needed for cough. 08/10/18   [name], MD  dextromethorphan 15 MG/5ML syrup Take 10 mLs (30 mg total) by mouth 3 (three) times daily as needed for cough. 05/03/18   [name], MD  EPIPEN 2-PAK 0.3 MG/0.3ML SOAJ injection Inject 0.3 mg into the muscle once.     [provider]  hydrochlorothiazide (HYDRODIURIL) 12.5 MG tablet Take 12.5 mg by mouth daily. 03/15/18   [provider]  Ibuprofen-diphenhydrAMINE Cit (ADVIL PM) 200-38 MG TABS Take 3 tablets by mouth at bedtime.    [provider]  Melatonin 5 MG TABS Take 5 mg by mouth at bedtime.    [provider]  metoprolol tartrate (LOPRESSOR) 50 MG tablet Take 1 tablet (50 mg total) by mouth 2 (two) times daily. 12/08/17 12/08/18  [name], MD  omeprazole (PRILOSEC) 40 MG capsule TAKE 1 CAPSULE(40 MG) BY MOUTH TWICE DAILY 03/22/18   [name], MD  rosuvastatin (CRESTOR) 40 MG tablet Take 1 tablet (40 mg total) by mouth daily. 11/11/16 05/03/18  Minna Merritts, MD  sertraline (ZOLOFT) 100 MG tablet Take 150 mg by mouth daily.     [provider]  traMADol (ULTRAM) 50 MG tablet Take 1 tablet (50 mg total) by mouth every 6 (six) hours as needed. 10/21/18   Nyari Olsson, Linden Dolin, PA-C  vitamin B-12 (CYANOCOBALAMIN) 1000 MCG tablet Take 1,000 mcg by mouth daily.    [provider]  Vitamin D, Ergocalciferol, (DRISDOL) 50000 units CAPS capsule Take 50,000 Units by mouth every Saturday.  10/08/16   [provider]  vitamin E 600 UNIT capsule Take 600 Units by mouth 2 (two) times daily.    [provider]    Allergies Contrast media [iodinated diagnostic agents]; Gadolinium derivatives; Gadoversetamide; Shellfish allergy; Sulfa antibiotics; Hydroxychloroquine sulfate; Banana; Limonene; Nabumetone; Potassium-containing compounds; Prednisone; Sulfasalazine; Sulfonamide derivatives; and  Metronidazole  Family History  Problem Relation Age of Onset  . Hypertension Mother   . Hyperlipidemia Mother   . Diabetes Mother   . Hypertension Father   . Hyperlipidemia Father   . Arrhythmia Father        A-Fib  . Diabetes Paternal Uncle   . Diabetes Paternal Grandfather   . Breast cancer Maternal Aunt        great in late 29's  . Anesthesia problems Neg Hx   . Colon cancer Neg Hx   . Stomach cancer Neg Hx   . Rectal cancer Neg Hx   . Liver cancer Neg Hx   . Esophageal cancer Neg Hx     Social History Social History   Tobacco Use  . Smoking  status: Never Smoker  . Smokeless tobacco: Never Used  Substance Use Topics  . Alcohol use: No    Alcohol/week: 1.0 standard drinks    Types: 1 Glasses of wine per week    Comment: occasional wine - less than once a month  . Drug use: No    Review of Systems  Constitutional: No fever/chills Eyes: No visual changes. ENT: No sore throat. Respiratory: Denies cough Genitourinary: Negative for dysuria. Musculoskeletal: Negative for back pain.  Positive for right foot and ankle pain Skin: Negative for rash.    ____________________________________________   PHYSICAL EXAM:  VITAL SIGNS: ED Triage Vitals [10/21/18 1329]  Enc Vitals Group     BP 140/77     Pulse Rate (!) 58     Resp 16     Temp 98.1 F (36.7 C)     Temp Source Oral     SpO2 96 %     Weight 250 lb (113.4 kg)     Height _0  (1.753 m)     Head Circumference      Peak Flow      Pain Score 8     Pain Loc      Pain Edu?      Excl. in [place]?     Constitutional: Alert and oriented. Well appearing and in no acute distress. Eyes: Conjunctivae are normal.  Head: Atraumatic. Nose: No congestion/rhinnorhea. Mouth/Throat: Mucous membranes are moist.   Neck:  supple no lymphadenopathy noted Cardiovascular: Normal rate, regular rhythm. Heart sounds are normal Respiratory: Normal respiratory effort.  No retractions, lungs c t a  GU:  deferred Musculoskeletal: Decreased range of motion of the right ankle due to the amount of swelling and discomfort.  Bruising is noted across the top of the foot and at the lateral aspect of the ankle.  Area is tender to palpation.  Upper tib-fib is not tender.   Neurologic:  Normal speech and language.  Skin:  Skin is warm, dry and intact. No rash noted. Psychiatric: Mood and affect are normal. Speech and behavior are normal.  ____________________________________________   LABS (all labs ordered are listed, but only abnormal results are displayed)  Labs Reviewed - No data to display ____________________________________________   ____________________________________________  RADIOLOGY  X-ray of the right ankle and foot are negative for fracture  ____________________________________________   PROCEDURES  Procedure(s) performed: Posterior OCL applied by the tech.  Procedures    ____________________________________________   INITIAL IMPRESSION / ASSESSMENT AND PLAN / ED COURSE  Pertinent labs & imaging results that were available during my care of the patient were reviewed by me and considered in my medical decision making (see chart for details).   Patient is a 73 year old female presents emergency department complaint of right ankle pain after a fall.  She denies head injury or LOC.  She denies any other injuries.  Physical exam shows that the right ankle and foot are swollen and tender.  Bruising is noted on both.  Remainder the exam is unremarkable  X-ray of the right foot and ankle are negative for fracture  Due to the amount of swelling patient was placed in a posterior OCL.  She is to use her walker that she has at home.  Follow-up with Dr. Mack Guise as she is already established with him.  Apply ice to the foot and ankle.  She was given a prescription for tramadol for pain not controlled by ibuprofen and Tylenol.  She states she understands all instructions.  Discharged stable condition.     As part of my medical decision making, I reviewed the following data within the [place] notes reviewed and incorporated, Old chart reviewed, Radiograph reviewed x-ray of the right foot and ankle are negative for fracture, Notes from prior ED visits and [place] Controlled Substance Database  ____________________________________________   FINAL CLINICAL IMPRESSION(S) / ED DIAGNOSES  Final diagnoses:  Sprain of right ankle, unspecified ligament, initial encounter      NEW MEDICATIONS STARTED DURING THIS VISIT:  New Prescriptions   TRAMADOL (ULTRAM) 50 MG TABLET    Take 1 tablet (50 mg total) by mouth every 6 (six) hours as needed.     Note:  This document was prepared using Dragon voice recognition software and may include unintentional dictation errors.    Versie Starks, PA-C 10/21/18 1548    Lavonia Drafts, MD 10/22/18 1124

## 2018-10-21 NOTE — ED Triage Notes (Signed)
Pt lives in independent living at [facility].  She tripped and fell today causing pain and swelling to her right ankle.  Pt has Ice in place from ems. Hematoma noted to the top of her foot, swelling and pain to ankle.  Pt has not been able to bear any weight since this occurred.

## 2018-10-22 ENCOUNTER — Ambulatory Visit: Payer: Medicare Other

## 2018-11-05 ENCOUNTER — Ambulatory Visit (INDEPENDENT_AMBULATORY_CARE_PROVIDER_SITE_OTHER): Payer: Medicare Other

## 2018-11-05 DIAGNOSIS — J4540 Moderate persistent asthma, uncomplicated: Secondary | ICD-10-CM

## 2018-11-10 MED ORDER — OMALIZUMAB 150 MG ~~LOC~~ SOLR
375.0000 mg | Freq: Once | SUBCUTANEOUS | Status: AC
  Administered 2018-11-05: 375 mg via SUBCUTANEOUS

## 2018-11-15 ENCOUNTER — Telehealth: Payer: Self-pay | Admitting: Pulmonary Disease

## 2018-11-15 NOTE — Telephone Encounter (Signed)
#   vials:6 Ordered date:11/15/2018 Shipping Date:11/17/2018

## 2018-11-18 NOTE — Telephone Encounter (Signed)
#   Vials:6 Arrival Date: 11/18/2018 Lot #: [id number] Exp Date:04/2022

## 2018-11-19 ENCOUNTER — Ambulatory Visit (INDEPENDENT_AMBULATORY_CARE_PROVIDER_SITE_OTHER): Payer: Medicare Other

## 2018-11-19 DIAGNOSIS — J4540 Moderate persistent asthma, uncomplicated: Secondary | ICD-10-CM

## 2018-11-23 MED ORDER — OMALIZUMAB 150 MG ~~LOC~~ SOLR
375.0000 mg | SUBCUTANEOUS | Status: DC
  Administered 2018-11-19 – 2018-12-17 (×2): 375 mg via SUBCUTANEOUS

## 2018-11-23 NOTE — Progress Notes (Signed)
Xolair injection documentation and charges entered by Ashley Caulfield, RMA, based on injection sheet filled out by Tammy Scott during preparation and administration. This documentation process is due to office requirements.   

## 2018-11-24 ENCOUNTER — Other Ambulatory Visit: Payer: Self-pay | Admitting: Pulmonary Disease

## 2018-11-25 ENCOUNTER — Ambulatory Visit: Payer: Medicare Other

## 2018-11-25 ENCOUNTER — Ambulatory Visit: Payer: Medicare Other | Attending: Pulmonary Disease

## 2018-11-25 DIAGNOSIS — R0600 Dyspnea, unspecified: Secondary | ICD-10-CM | POA: Diagnosis present

## 2018-11-25 MED ORDER — ALBUTEROL SULFATE (2.5 MG/3ML) 0.083% IN NEBU
2.5000 mg | INHALATION_SOLUTION | Freq: Once | RESPIRATORY_TRACT | Status: AC
  Administered 2018-11-25: 2.5 mg via RESPIRATORY_TRACT
  Filled 2018-11-25: qty 3

## 2018-12-03 ENCOUNTER — Other Ambulatory Visit: Payer: Self-pay | Admitting: Sports Medicine

## 2018-12-03 ENCOUNTER — Ambulatory Visit (INDEPENDENT_AMBULATORY_CARE_PROVIDER_SITE_OTHER): Payer: Medicare Other

## 2018-12-03 ENCOUNTER — Ambulatory Visit: Payer: Medicare Other | Admitting: Pulmonary Disease

## 2018-12-03 ENCOUNTER — Encounter: Payer: Self-pay | Admitting: Pulmonary Disease

## 2018-12-03 VITALS — BP 133/90 | HR 92 | Ht 67.0 in | Wt 255.0 lb

## 2018-12-03 DIAGNOSIS — J4540 Moderate persistent asthma, uncomplicated: Secondary | ICD-10-CM | POA: Diagnosis not present

## 2018-12-03 DIAGNOSIS — M7989 Other specified soft tissue disorders: Secondary | ICD-10-CM

## 2018-12-03 DIAGNOSIS — R05 Cough: Secondary | ICD-10-CM

## 2018-12-03 DIAGNOSIS — K219 Gastro-esophageal reflux disease without esophagitis: Secondary | ICD-10-CM

## 2018-12-03 DIAGNOSIS — R053 Chronic cough: Secondary | ICD-10-CM

## 2018-12-03 MED ORDER — OMALIZUMAB 150 MG ~~LOC~~ SOLR
375.0000 mg | Freq: Once | SUBCUTANEOUS | Status: AC
  Administered 2018-11-25: 375 mg via SUBCUTANEOUS

## 2018-12-03 MED ORDER — OMEPRAZOLE 40 MG PO CPDR: 40.0000 mg | DELAYED_RELEASE_CAPSULE | Freq: Every day | ORAL | 0 refills | Status: DC

## 2018-12-03 NOTE — Patient Instructions (Signed)
Continue all medications as previously prescribed except change omeprazole (Prilosec) to once a day taken at dinnertime  Follow-up in 6 months.  Call sooner if needed

## 2018-12-05 NOTE — Progress Notes (Signed)
PULMONARY OFFICE FOLLOW UP NOTE  Requesting MD/Service: Kary Kos Date of initial consultation: 09/09/16 Reason for consultation: Chronic cough  PT PROFILE: 73 y.o. F never smoker with chronic severe cough of > 4 yrs duration, previously evaluated by Dr Chase Caller and Dr Raul Del. She has also been evaluated by ENT Tami Ribas) previously. Despite multiple therapeutic attempts, there have been no interventions or therapies that have alleviated her cough  DATA: PFTs 04/17/14: No obstruction, normal lung volumes, minimal decrease in DLCO CT chest 02/20/15: Stable small pulmonary nodules compatible with a benign process HRCT 11/20/15: Normal CT sinuses 11/20/15: Normal Spirometry 09/09/16: No obstruction Bronchoscopy 10/10/16: nasal septal perforation, otherwise normal upper airway, normal cords, mild bronchitis, minimal clear mucus, no endobronchial masses or foreign bodies. BAL grew < 10k NOF Spirometry 01/06/17: FVC:  2.75L (77%pred), FEV1:  2.14 L (79 %pred), FEV1/FVC: 78% PSG 01/15/17: AHI 10/hr. REM AHI 48/hr CPAP titration 01/28/17: CPAP 12 cm H2O recommended CT chest 02/12/17: minimal pleural based nodular infiltrate in RLL, minimal chronic fibrotic appearing changes in lingula - both stable over 2 yrs duration CXR 10/27/17: NACPD CT maxillofacial 10/27/17: No evidence of sinusitis Bronchoscopy 12/11/17: normal anatomy. Moderate to severe diffuse erythematous and hyperemic mucosal changes which bled very easily with minimal scope trauma. Endobronchial mucosal biopsies obtained. Pathology: RESPIRATORY MUCOSA WITH MILD EOSINOPHILIC STROMAL INFILTRATE. NEGATIVE FOR MALIGNANCY. 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 Nebulized budesonide triggering migraines. Remains miserable. Depomedrol 120 mg IM administered. Tussionex refilled. Referral to [facility] requested PFTs 02/22/18  [facility]): No obstruction, low normal lung volumes, minimally reduced DLCO RAST 02/22/18 [facility]): normal IgE 02/22/18 ([place]): 394 (4-269) IgE 06/08/18: 926 08/2018: Xolair initiate 10/05/18 LE venous US: No DVT  INTERVAL: Last seen 09/21/2018.  No major pulmonary events in the interim.  However, suffered a fall with L LE tib-fib fracture   SUBJ: This is a scheduled follow-up.  Her cough remains remarkably well controlled since initiation of Xolair.  She did miss 1 dose of Xolair due to her fall and lower extremity fracture.  With this missed dose, she did note a relapse in cough.  She has no new complaints. Denies CP, fever, purulent sputum, hemoptysis, LE edema and calf tenderness.   OBJ: Vitals:   12/03/18 0904 12/03/18 0909  BP:  133/90  Pulse:  92  SpO2:  95%  Weight: 255 lb (115.7 kg)   Height: _0  (1.702 m)   Room air   EXAM: Gen: NAD HEENT: NCAT, sclerae white Neck: No JVD Lungs: breath sounds full, no wheezes Cardiovascular: RRR, no murmurs Abdomen: Soft, nontender, normal BS Ext: without clubbing, cyanosis, edema Neuro: grossly intact Skin: Limited exam, no lesions noted    DATA:   BMP Latest Ref Rng & Units 06/04/2016 12/30/2014 10/17/2014  Glucose 65 - 99 mg/dL 119(H) 167(H) 140(H)  BUN 6 - 20 mg/dL 23(H) 7 12  Creatinine 0.44 - 1.00 mg/dL 0.89 0.65 0.72  Sodium 135 - 145 mmol/L 128(L) 134(L) 136  Potassium 3.5 - 5.1 mmol/L 4.1 3.2(L) 4.7  Chloride 101 - 111 mmol/L 95(L) 101 105  CO2 22 - 32 mmol/L _1 Calcium 8.9 - 10.3 mg/dL 9.4 8.9 9.7    CBC Latest Ref Rng & Units 06/08/2018 06/04/2016 12/30/2014  WBC 3.6 - 11.0 K/uL 11.2(H) 11.9(H) 7.6  Hemoglobin 12.0 - 16.0 g/dL 14.1 13.8 10.7(L)  Hematocrit 35.0 - 47.0 % 39.9 39.6 33.2(L)  Platelets 150 - 440 K/uL  276 293 280    CXR: No new film  IMPRESSION:   Chronic cough, dramatic improvement in control after initiation of Xolair 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  Call sooner if needed   Merton Border, MD PCCM service Mobile [phone] Pager [phone] 12/05/2018 5:05 PM

## 2018-12-06 ENCOUNTER — Ambulatory Visit
Admission: RE | Admit: 2018-12-06 | Discharge: 2018-12-06 | Disposition: A | Discharge status: Home / Self Care | Payer: Medicare Other | Source: Ambulatory Visit | Attending: Sports Medicine | Admitting: Sports Medicine

## 2018-12-06 DIAGNOSIS — M7989 Other specified soft tissue disorders: Secondary | ICD-10-CM | POA: Diagnosis not present

## 2018-12-14 ENCOUNTER — Telehealth: Payer: Self-pay | Admitting: Pulmonary Disease

## 2018-12-14 DIAGNOSIS — Q782 Osteopetrosis: Secondary | ICD-10-CM | POA: Insufficient documentation

## 2018-12-14 NOTE — Telephone Encounter (Signed)
#   vials:6 Ordered date:12/14/2018 Shipping Date:12/15/2018

## 2018-12-16 NOTE — Telephone Encounter (Signed)
#   Vials:6 Arrival Date:12/16/2018 Lot #:[id number] Exp Date:04/2022

## 2018-12-16 NOTE — Telephone Encounter (Signed)
Dr. Simonds please advise. Thanks 

## 2018-12-17 ENCOUNTER — Ambulatory Visit (INDEPENDENT_AMBULATORY_CARE_PROVIDER_SITE_OTHER): Payer: Medicare Other

## 2018-12-17 ENCOUNTER — Other Ambulatory Visit: Payer: Self-pay

## 2018-12-17 DIAGNOSIS — J4540 Moderate persistent asthma, uncomplicated: Secondary | ICD-10-CM | POA: Diagnosis not present

## 2018-12-20 MED ORDER — OMALIZUMAB 150 MG ~~LOC~~ SOLR
375.0000 mg | Freq: Once | SUBCUTANEOUS | Status: AC
  Administered 2018-12-17: 375 mg via SUBCUTANEOUS

## 2018-12-27 NOTE — Telephone Encounter (Signed)
Dr. Simonds please advise. Thanks 

## 2018-12-31 ENCOUNTER — Ambulatory Visit: Payer: Medicare Other

## 2019-01-05 MED ORDER — OMALIZUMAB 150 MG ~~LOC~~ SOLR
375.0000 mg | Freq: Once | SUBCUTANEOUS | Status: AC
  Administered 2018-11-25: 375 mg via SUBCUTANEOUS

## 2019-01-06 ENCOUNTER — Ambulatory Visit (INDEPENDENT_AMBULATORY_CARE_PROVIDER_SITE_OTHER): Payer: Medicare Other

## 2019-01-06 ENCOUNTER — Other Ambulatory Visit: Payer: Self-pay

## 2019-01-06 DIAGNOSIS — J4540 Moderate persistent asthma, uncomplicated: Secondary | ICD-10-CM

## 2019-01-06 MED ORDER — OMALIZUMAB 150 MG ~~LOC~~ SOLR
375.0000 mg | Freq: Once | SUBCUTANEOUS | Status: AC
  Administered 2019-01-06: 375 mg via SUBCUTANEOUS

## 2019-01-12 ENCOUNTER — Telehealth: Payer: Self-pay

## 2019-01-12 NOTE — Telephone Encounter (Signed)
Received letter from [facility], stating that they have attempted to contact pt without success regarding Xolair refills.  Spoke to pt and provided her with [facility] contact number.  Pt stated that she would reach out to [facility]. Nothing further is needed at this time.

## 2019-01-18 MED ORDER — OMALIZUMAB 150 MG ~~LOC~~ SOLR
375.0000 mg | Freq: Once | SUBCUTANEOUS | Status: AC
  Administered 2019-01-06: 13:00:00 375 mg via SUBCUTANEOUS

## 2019-01-18 NOTE — Addendum Note (Signed)
Addended by: Alroy Bailiff B on: 01/18/2019 01:27 PM   Modules accepted: Orders

## 2019-01-19 ENCOUNTER — Telehealth: Payer: Self-pay | Admitting: Pulmonary Disease

## 2019-01-19 NOTE — Telephone Encounter (Signed)
Xolair medication ordered for patient's next injection 150mg  Prefilled Syringe:  #4 75mg  Prefilled Syringe: #2 Ordered Date: 01/19/2019 Expected shipping date: 01/19/2019

## 2019-01-20 ENCOUNTER — Other Ambulatory Visit: Payer: Self-pay

## 2019-01-20 ENCOUNTER — Ambulatory Visit (INDEPENDENT_AMBULATORY_CARE_PROVIDER_SITE_OTHER): Payer: Medicare Other

## 2019-01-20 DIAGNOSIS — J4540 Moderate persistent asthma, uncomplicated: Secondary | ICD-10-CM

## 2019-01-20 MED ORDER — OMALIZUMAB 150 MG ~~LOC~~ SOLR
375.0000 mg | Freq: Once | SUBCUTANEOUS | Status: AC
  Administered 2019-01-20: 375 mg via SUBCUTANEOUS

## 2019-01-20 NOTE — Telephone Encounter (Signed)
Xolair Received: # Vials: 6 Medication arrival date: 01/20/2019 Lot #: [id number] Medication exp date:9/20213 Received by: Laurette Schimke

## 2019-01-20 NOTE — Progress Notes (Signed)
Have you been hospitalized within the last 10 days?  No Do you have a fever?  No Do you have a cough?  No Do you have a headache or sore throat? No  

## 2019-01-25 ENCOUNTER — Other Ambulatory Visit
Admission: RE | Admit: 2019-01-25 | Discharge: 2019-01-25 | Disposition: A | Discharge status: Home / Self Care | Payer: Medicare Other | Source: Ambulatory Visit | Attending: Gastroenterology | Admitting: Gastroenterology

## 2019-01-25 DIAGNOSIS — R197 Diarrhea, unspecified: Secondary | ICD-10-CM | POA: Diagnosis present

## 2019-01-25 DIAGNOSIS — R110 Nausea: Secondary | ICD-10-CM | POA: Insufficient documentation

## 2019-01-25 DIAGNOSIS — R1031 Right lower quadrant pain: Secondary | ICD-10-CM | POA: Diagnosis present

## 2019-01-25 LAB — GASTROINTESTINAL PANEL BY PCR, STOOL (REPLACES STOOL CULTURE)

## 2019-01-25 LAB — C DIFFICILE QUICK SCREEN W PCR REFLEX
C Diff antigen: NEGATIVE
C Diff interpretation: NOT DETECTED
C Diff toxin: NEGATIVE

## 2019-01-28 ENCOUNTER — Telehealth: Payer: Self-pay | Admitting: Pulmonary Disease

## 2019-01-28 NOTE — Telephone Encounter (Signed)
Called and spoke to pt's spouse, Caitlin Leonard [facility]). Caitlin Leonard states that pt is experiencing dry cough, postnasal drip & wheezing x2-3d. Sob is baseline. Denied fever, chills or sweats. Pt is using albuterol inhaler once daily with some relief in sx.  Caitlin Leonard is requesting Rx for tussionex, as this has helped previously.  Preferred pharmacy is [facility].  Caitlin Leonard does not feel that pt should not come into the office.  DS please advise. Thanks

## 2019-02-01 ENCOUNTER — Other Ambulatory Visit: Payer: Self-pay | Admitting: Internal Medicine

## 2019-02-01 MED ORDER — BENZONATATE 100 MG PO CAPS: 200.0000 mg | ORAL_CAPSULE | Freq: Three times a day (TID) | ORAL | 2 refills | Status: DC

## 2019-02-01 NOTE — Telephone Encounter (Signed)
Called pt and relayed below recommendations.  Pt stated that she has tried Tessalon previously without relief.  During our conversation, wheezing was heard.  Pt also mentioned that she sounds and feels exactly [facility] she did prior to starting xolair.   DR please advise. Thanks

## 2019-02-01 NOTE — Telephone Encounter (Signed)
Pt's husband called again in reference to this. Stated that wife is not doing good and would have expected a response by now.

## 2019-02-01 NOTE — Telephone Encounter (Signed)
I have spoken to pt's spouse, Barry(DPR).  Caitlin Leonard is requesting refill on Tussionex, as pt is experiencing continuous prod cough with yellow mucus, weakness & increased sob with exertion.  Pt is unable to rest due to cough Using albuterol q4h with some relief in sx.  Pt has tried and failed delsym.   DR please advise, as DS is currently unavailable. Caitlin Leonard is requesting recommendations ASAP.

## 2019-02-03 ENCOUNTER — Ambulatory Visit: Payer: Medicare Other

## 2019-02-07 ENCOUNTER — Other Ambulatory Visit: Payer: Self-pay | Admitting: Pulmonary Disease

## 2019-02-07 ENCOUNTER — Telehealth: Payer: Self-pay

## 2019-02-07 MED ORDER — HYDROCOD POLST-CPM POLST ER 10-8 MG/5ML PO SUER: 5.0000 mL | Freq: Every evening | ORAL | 0 refills | Status: DC | PRN

## 2019-02-07 NOTE — Progress Notes (Signed)
Tussionex re-ordered  Merton Border, MD PCCM service Mobile [phone] Pager [phone] 02/07/2019 10:52 AM

## 2019-02-07 NOTE — Telephone Encounter (Signed)
Called and spoke to patient to let her know we have the prescription for Tussinex. While talking to patient she coughed multiple times and wheezes were audible. She states she has used her albuterol inhaler twice this morning so far with no relief. She denies shortness of breath and her breathing is okay otherwise.  DS please advise.

## 2019-02-08 NOTE — Telephone Encounter (Signed)
She can get like that when her cough is bad. Let's see how she does with Tussionex and if she calls back needing something more, I'll call in some prednisone  Thanks  Waunita Schooner

## 2019-02-09 NOTE — Telephone Encounter (Signed)
See 5/4 phone messaged. Tussinex has been sent for the patient. Message closed.

## 2019-02-10 ENCOUNTER — Ambulatory Visit: Payer: Medicare Other

## 2019-02-10 ENCOUNTER — Other Ambulatory Visit: Payer: Self-pay | Admitting: Pulmonary Disease

## 2019-02-10 MED ORDER — AZITHROMYCIN 250 MG PO TABS: ORAL_TABLET | ORAL | 0 refills | Status: AC

## 2019-02-10 NOTE — Telephone Encounter (Signed)
Will route to Dr. Alva Garnet to make aware.

## 2019-02-17 ENCOUNTER — Ambulatory Visit (INDEPENDENT_AMBULATORY_CARE_PROVIDER_SITE_OTHER): Payer: Medicare Other

## 2019-02-17 ENCOUNTER — Other Ambulatory Visit: Payer: Self-pay

## 2019-02-17 DIAGNOSIS — J4540 Moderate persistent asthma, uncomplicated: Secondary | ICD-10-CM | POA: Diagnosis not present

## 2019-02-17 MED ORDER — OMALIZUMAB 150 MG ~~LOC~~ SOLR
375.0000 mg | Freq: Once | SUBCUTANEOUS | Status: AC
  Administered 2019-02-17: 16:00:00 375 mg via SUBCUTANEOUS

## 2019-02-17 NOTE — Patient Instructions (Signed)
Caitlin Leonard came in on 02/17/2019 to receive a Xolair injection. The patient is given 375mg  every 14 days days. Due to each vial equalling 150mg , 25mg  of medication was wasted.

## 2019-02-22 ENCOUNTER — Telehealth: Payer: Self-pay | Admitting: Pulmonary Disease

## 2019-02-22 NOTE — Telephone Encounter (Signed)
Xolair Order: # vials: 6 Ordered date: 02/22/2019 Expected date of arrival: 02/24/2019 Ordered by: Desmond Dike, [place]: [facility]

## 2019-02-24 NOTE — Telephone Encounter (Signed)
Xolair Received: # Vials: 6 Medication arrival date: 02/24/2019 Lot #: [id number] Exp date: 06/2022 Received by: Laurette Schimke

## 2019-03-03 ENCOUNTER — Ambulatory Visit (INDEPENDENT_AMBULATORY_CARE_PROVIDER_SITE_OTHER): Payer: Medicare Other

## 2019-03-03 ENCOUNTER — Other Ambulatory Visit: Payer: Self-pay

## 2019-03-03 DIAGNOSIS — J4540 Moderate persistent asthma, uncomplicated: Secondary | ICD-10-CM | POA: Diagnosis not present

## 2019-03-03 MED ORDER — OMALIZUMAB 150 MG ~~LOC~~ SOLR
375.0000 mg | Freq: Once | SUBCUTANEOUS | Status: AC
  Administered 2019-03-03: 11:00:00 375 mg via SUBCUTANEOUS

## 2019-03-03 NOTE — Patient Instructions (Signed)
Caitlin Leonard came in on 03/03/2019 to receive a Xolair injection. The patient is given 375mg  every 14 days. Due to each vial equalling 150mg , 75mg  of medication was wasted.

## 2019-03-03 NOTE — Progress Notes (Signed)
Have you been hospitalized within the last 10 days?  No Do you have a fever?  No Do you have a cough?  No Do you have a headache or sore throat? No  

## 2019-03-17 ENCOUNTER — Other Ambulatory Visit: Payer: Self-pay

## 2019-03-17 ENCOUNTER — Ambulatory Visit (INDEPENDENT_AMBULATORY_CARE_PROVIDER_SITE_OTHER): Payer: Medicare Other

## 2019-03-17 DIAGNOSIS — J4540 Moderate persistent asthma, uncomplicated: Secondary | ICD-10-CM

## 2019-03-17 MED ORDER — OMALIZUMAB 150 MG ~~LOC~~ SOLR
375.0000 mg | Freq: Once | SUBCUTANEOUS | Status: AC
  Administered 2019-03-17: 375 mg via SUBCUTANEOUS

## 2019-03-17 NOTE — Patient Instructions (Addendum)
Britiny Defrain came in on 03/17/2019 to receive a Xolair injection. The patient is given 375mg  every 14 days. Due to each vial equalling 150mg , 75mg  of medication was wasted. TBS  Correction 03/30/2019 I checked the pt supplied box and deleted the wrong one, sorry. Let's try again. Corrected everything now for 03/17/2019 on 03/30/2019. No waste charge needed, pt's supplies med.Marland Kitchen

## 2019-03-17 NOTE — Progress Notes (Signed)
Have you been hospitalized within the last 10 days?  No Do you have a fever?  No Do you have a cough?  No Do you have a headache or sore throat? No  

## 2019-03-22 ENCOUNTER — Other Ambulatory Visit
Admission: RE | Admit: 2019-03-22 | Discharge: 2019-03-22 | Disposition: A | Discharge status: Home / Self Care | Payer: Medicare Other | Source: Ambulatory Visit | Attending: Gastroenterology | Admitting: Gastroenterology

## 2019-03-22 ENCOUNTER — Telehealth: Payer: Self-pay | Admitting: Pulmonary Disease

## 2019-03-22 ENCOUNTER — Other Ambulatory Visit: Payer: Self-pay

## 2019-03-22 DIAGNOSIS — Z1159 Encounter for screening for other viral diseases: Secondary | ICD-10-CM | POA: Diagnosis present

## 2019-03-22 NOTE — Telephone Encounter (Signed)
Xolair Order: # vials: 6 Ordered date: 03/22/2019 Expected date of arrival: 03/24/2019 Ordered by: Desmond Dike, [place]: [facility]

## 2019-03-23 LAB — NOVEL CORONAVIRUS, NAA (HOSP ORDER, SEND-OUT TO REF LAB; TAT 18-24 HRS): SARS-CoV-2, NAA: NOT DETECTED

## 2019-03-24 ENCOUNTER — Ambulatory Visit: Payer: Medicare Other

## 2019-03-24 ENCOUNTER — Encounter: Payer: Self-pay | Admitting: Emergency Medicine

## 2019-03-24 NOTE — Telephone Encounter (Signed)
Xolair Received: # Vials: 6 Medication arrival date: 03/24/2019 Lot #: [id number] Exp date: 08/2022 Received by: Desmond Dike, [place]

## 2019-03-25 ENCOUNTER — Encounter: Payer: Self-pay | Admitting: *Deleted

## 2019-03-25 ENCOUNTER — Ambulatory Visit: Payer: Medicare Other | Admitting: Anesthesiology

## 2019-03-25 ENCOUNTER — Ambulatory Visit
Admission: RE | Admit: 2019-03-25 | Discharge: 2019-03-25 | Disposition: A | Discharge status: Home / Self Care | Payer: Medicare Other | Attending: Gastroenterology | Admitting: Gastroenterology

## 2019-03-25 ENCOUNTER — Encounter
Admission: RE | Disposition: A | Discharge status: Home / Self Care | Payer: Self-pay | Source: Home / Self Care | Attending: Gastroenterology

## 2019-03-25 ENCOUNTER — Other Ambulatory Visit: Payer: Self-pay

## 2019-03-25 DIAGNOSIS — J449 Chronic obstructive pulmonary disease, unspecified: Secondary | ICD-10-CM | POA: Diagnosis not present

## 2019-03-25 DIAGNOSIS — Z7984 Long term (current) use of oral hypoglycemic drugs: Secondary | ICD-10-CM | POA: Diagnosis not present

## 2019-03-25 DIAGNOSIS — G4730 Sleep apnea, unspecified: Secondary | ICD-10-CM | POA: Diagnosis not present

## 2019-03-25 DIAGNOSIS — Z7982 Long term (current) use of aspirin: Secondary | ICD-10-CM | POA: Diagnosis not present

## 2019-03-25 DIAGNOSIS — E119 Type 2 diabetes mellitus without complications: Secondary | ICD-10-CM | POA: Insufficient documentation

## 2019-03-25 DIAGNOSIS — I4891 Unspecified atrial fibrillation: Secondary | ICD-10-CM | POA: Diagnosis not present

## 2019-03-25 DIAGNOSIS — R110 Nausea: Secondary | ICD-10-CM | POA: Insufficient documentation

## 2019-03-25 DIAGNOSIS — I10 Essential (primary) hypertension: Secondary | ICD-10-CM | POA: Diagnosis not present

## 2019-03-25 DIAGNOSIS — K2950 Unspecified chronic gastritis without bleeding: Secondary | ICD-10-CM | POA: Diagnosis not present

## 2019-03-25 DIAGNOSIS — R634 Abnormal weight loss: Secondary | ICD-10-CM | POA: Insufficient documentation

## 2019-03-25 DIAGNOSIS — K219 Gastro-esophageal reflux disease without esophagitis: Secondary | ICD-10-CM | POA: Insufficient documentation

## 2019-03-25 DIAGNOSIS — Z79899 Other long term (current) drug therapy: Secondary | ICD-10-CM | POA: Diagnosis not present

## 2019-03-25 DIAGNOSIS — Q438 Other specified congenital malformations of intestine: Secondary | ICD-10-CM | POA: Insufficient documentation

## 2019-03-25 DIAGNOSIS — R1011 Right upper quadrant pain: Secondary | ICD-10-CM | POA: Diagnosis not present

## 2019-03-25 DIAGNOSIS — D124 Benign neoplasm of descending colon: Secondary | ICD-10-CM | POA: Diagnosis not present

## 2019-03-25 DIAGNOSIS — K52831 Collagenous colitis: Secondary | ICD-10-CM | POA: Insufficient documentation

## 2019-03-25 HISTORY — DX: Unspecified atrial fibrillation: I48.91

## 2019-03-25 HISTORY — DX: Anemia, unspecified: D64.9

## 2019-03-25 HISTORY — DX: Gastrointestinal hemorrhage, unspecified: K92.2

## 2019-03-25 HISTORY — PX: COLONOSCOPY WITH PROPOFOL: SHX5780

## 2019-03-25 HISTORY — DX: Chronic obstructive pulmonary disease, unspecified: J44.9

## 2019-03-25 HISTORY — DX: Carpal tunnel syndrome, unspecified upper limb: G56.00

## 2019-03-25 HISTORY — DX: Migraine, unspecified, not intractable, without status migrainosus: G43.909

## 2019-03-25 HISTORY — PX: ESOPHAGOGASTRODUODENOSCOPY (EGD) WITH PROPOFOL: SHX5813

## 2019-03-25 HISTORY — DX: Cervicalgia: M54.2

## 2019-03-25 HISTORY — DX: Cerebral infarction, unspecified: I63.9

## 2019-03-25 HISTORY — DX: Sick sinus syndrome: I49.5

## 2019-03-25 HISTORY — DX: Nonrheumatic mitral (valve) prolapse: I34.1

## 2019-03-25 HISTORY — DX: Type 2 diabetes mellitus without complications: E11.9

## 2019-03-25 LAB — KOH PREP
KOH Prep: NONE SEEN
Special Requests: NORMAL

## 2019-03-25 LAB — GLUCOSE, CAPILLARY: Glucose-Capillary: 141 mg/dL — ABNORMAL HIGH (ref 70–99)

## 2019-03-25 SURGERY — ESOPHAGOGASTRODUODENOSCOPY (EGD) WITH PROPOFOL
Anesthesia: General

## 2019-03-25 MED ORDER — SODIUM CHLORIDE 0.9 % IV SOLN: INTRAVENOUS | Status: DC

## 2019-03-25 MED ORDER — SODIUM CHLORIDE 0.9 % IV SOLN
INTRAVENOUS | Status: DC
  Administered 2019-03-25: 1000 mL via INTRAVENOUS

## 2019-03-25 MED ORDER — PROPOFOL 500 MG/50ML IV EMUL
INTRAVENOUS | Status: DC | PRN
  Administered 2019-03-25: 160 ug/kg/min via INTRAVENOUS

## 2019-03-25 MED ORDER — PROPOFOL 500 MG/50ML IV EMUL
INTRAVENOUS | Status: AC
  Filled 2019-03-25: qty 50

## 2019-03-25 MED ORDER — PROPOFOL 10 MG/ML IV BOLUS
INTRAVENOUS | Status: AC
  Filled 2019-03-25: qty 40

## 2019-03-25 MED ORDER — PROPOFOL 10 MG/ML IV BOLUS
INTRAVENOUS | Status: AC
  Filled 2019-03-25: qty 20

## 2019-03-25 MED ORDER — PROPOFOL 10 MG/ML IV BOLUS
INTRAVENOUS | Status: DC | PRN
  Administered 2019-03-25: 10 mg via INTRAVENOUS
  Administered 2019-03-25: 100 mg via INTRAVENOUS

## 2019-03-25 NOTE — H&P (Signed)
Outpatient short stay form Pre-procedure 03/25/2019 8:48 AM Lollie Sails MD  Primary Physician: [place] Pink, MD  Reason for visit: Patient is a 73 year old female presenting today for EGD and colonoscopy.  She has been having problems with change of bowel habits with diarrhea bilateral lower abdominal pain and some weight loss approximately 10 pounds over the past several months.  Takes no other aspirin or blood thinning agents.  It is of note she takes 4 ibuprofen daily.  She has been on Prilosec for many years.  She tolerated her prep well.  History of present illness: Noted above    Current Facility-Administered Medications:  .  0.9 %  sodium chloride infusion, , Intravenous, Continuous, Lollie Sails, MD .  0.9 %  sodium chloride infusion, , Intravenous, Continuous, Lollie Sails, MD, Last Rate: 20 mL/hr at 03/25/19 0837, 1,000 mL at 03/25/19 0569  Facility-Administered Medications Prior to Admission  Medication Dose Route Frequency Provider Last Rate Last Dose  . chlorpheniramine-HYDROcodone (TUSSIONEX) 10-8 MG/5ML suspension 5 mL  5 mL Oral Q12H PRN [name], MD      . omalizumab Arvid Right) injection 375 mg  375 mg Subcutaneous Q14 Days [name], MD   375 mg at 12/17/18 1449   Medications Prior to Admission  Medication Sig Dispense Refill Last Dose  . ALPRAZolam (XANAX) 0.5 MG tablet Take 0.5 mg by mouth at bedtime.    Past Week at Unknown time  . aspirin EC 81 MG tablet Take 81 mg by mouth daily.   Past Week at Unknown time  . CARTIA XT 180 MG 24 hr capsule TAKE 1 CAPSULE(180 MG) BY MOUTH DAILY 90 capsule 3 03/24/2019 at Unknown time  . cetirizine (ZYRTEC) 10 MG tablet TAKE 1 TABLET(10 MG) BY MOUTH AT BEDTIME 30 tablet 2 03/24/2019 at Unknown time  . dextromethorphan-guaiFENesin (ROBITUSSIN-DM) 10-100 MG/5ML liquid Take by mouth every 4 (four) hours as needed for cough.     . diltiazem (DILACOR XR) 180 MG 24 hr capsule Take 180 mg by mouth daily.    03/24/2019 at Unknown time  . glucose blood test strip 1 each by Other route as needed for other. Use as instructed   03/24/2019 at Unknown time  . hydrochlorothiazide (HYDRODIURIL) 12.5 MG tablet Take 12.5 mg by mouth daily.   03/24/2019 at Unknown time  . hyoscyamine (LEVSIN) 0.125 MG tablet Take 0.125 mg by mouth every 4 (four) hours as needed.   03/24/2019 at Unknown time  . Ibuprofen-diphenhydrAMINE Cit (ADVIL PM) 200-38 MG TABS Take 3 tablets by mouth at bedtime.   Past Week at Unknown time  . Melatonin 5 MG TABS Take 5 mg by mouth at bedtime.   Past Week at Unknown time  . metoprolol tartrate (LOPRESSOR) 50 MG tablet TAKE 1 TABLET(50 MG) BY MOUTH TWICE DAILY 60 tablet 11 03/24/2019 at Unknown time  . Omalizumab (XOLAIR) 150 MG/ML SOSY Inject into the skin.   03/24/2019 at Unknown time  . omeprazole (PRILOSEC) 40 MG capsule Take 1 capsule (40 mg total) by mouth daily. Take at dinnertime 180 capsule 0 03/24/2019 at Unknown time  . ondansetron (ZOFRAN-ODT) 4 MG disintegrating tablet Take 4 mg by mouth every 8 (eight) hours as needed for nausea or vomiting.   Past Month at Unknown time  . polyethylene glycol powder (GLYCOLAX/MIRALAX) 17 GM/SCOOP powder Take 1 Container by mouth once.   03/24/2019 at Unknown time  . sertraline (ZOLOFT) 100 MG tablet Take 150 mg by mouth daily.  03/24/2019 at Unknown time  . traMADol (ULTRAM) 50 MG tablet Take by mouth every 6 (six) hours as needed.   Past Month at Unknown time  . vitamin B-12 (CYANOCOBALAMIN) 1000 MCG tablet Take 1,000 mcg by mouth daily.   03/24/2019 at Unknown time  . Vitamin D, Ergocalciferol, (DRISDOL) 50000 units CAPS capsule Take 50,000 Units by mouth every Saturday.   11 03/24/2019 at Unknown time  . vitamin E 600 UNIT capsule Take 600 Units by mouth 2 (two) times daily.   03/24/2019 at Unknown time  . albuterol (PROVENTIL HFA;VENTOLIN HFA) 108 (90 Base) MCG/ACT inhaler Inhale 1-2 puffs into the lungs every 6 (six) hours as needed for wheezing or  shortness of breath. 1 Inhaler 10 prn  . benzonatate (TESSALON PERLES) 100 MG capsule Take 2 capsules (200 mg total) by mouth 3 (three) times daily. (Patient not taking: Reported on 03/25/2019) 90 capsule 2 Completed Course at Unknown time  . Biotin 1000 MCG tablet Take 1,000 mcg by mouth 3 (three) times daily.   Not Taking at Unknown time  . budesonide (ENTOCORT EC) 3 MG 24 hr capsule TAKE 1 CAPSULE(3 MG) BY MOUTH DAILY (Patient not taking: Reported on 03/25/2019) 30 capsule 0 Completed Course at Unknown time  . carboxymethylcellulose (REFRESH PLUS) 0.5 % SOLN Place 1 drop into both eyes 2 (two) times daily.      . chlorpheniramine-HYDROcodone (TUSSIONEX PENNKINETIC ER) 10-8 MG/5ML SUER Take 5 mLs by mouth at bedtime as needed for cough. (Patient not taking: Reported on 03/25/2019) 115 mL 0 Completed Course at Unknown time  . diclofenac (VOLTAREN) 75 MG EC tablet Take 75 mg by mouth 2 (two) times daily.   Completed Course at Unknown time  . EPIPEN 2-PAK 0.3 MG/0.3ML SOAJ injection Inject 0.3 mg into the muscle once.    prn  . fluticasone furoate-vilanterol (BREO ELLIPTA) 100-25 MCG/INH AEPB Inhale 1 puff into the lungs daily.   Completed Course at Unknown time  . hydrochlorothiazide (MICROZIDE) 12.5 MG capsule Take 12.5 mg by mouth daily.   Completed Course at Unknown time  . HYDROcodone-acetaminophen (NORCO) 10-325 MG tablet Take 1 tablet by mouth every 6 (six) hours as needed.   Completed Course at Unknown time  . rosuvastatin (CRESTOR) 40 MG tablet Take 1 tablet (40 mg total) by mouth daily. 90 tablet 3      Allergies  Allergen Reactions  . Contrast Media [Iodinated Diagnostic Agents] Anaphylaxis  . Gadolinium Derivatives Anaphylaxis  . Gadoversetamide Anaphylaxis  . Shellfish Allergy Nausea And Vomiting and Other (See Comments)    Can eat shrimp but not oysters Dizziness,severe nausea and vomiting ( can eat shrimp CAN NOT EAT ORYSTERS) Other reaction(s): Other (See  Comments) Dizziness,severe nausea and vomiting ( can eat shrimp CAN NOT EAT ORYSTERS)  Can eat shrimp but not oysters Dizziness,severe nausea and vomiting ( can eat shrimp CAN NOT EAT ORYSTERS)  Can eat shrimp but not oysters   . Sulfa Antibiotics Hives and Other (See Comments)    GI upset GI upset GI upset  GI upset GI upset GI upset Other reaction(s): Other (See Comments) GI upset GI upset  GI upset GI upset   . Hydroxychloroquine Sulfate Hives and Other (See Comments)    Severe hives, all over like chicken pox.  . Banana Nausea And Vomiting and Other (See Comments)    Raw bananas  . Limonene Other (See Comments)    GI upset GI upset   . Morphine And Related   . Nabumetone  Other (See Comments)    Hypertension  . Otezla [Apremilast]   . Potassium-Containing Compounds Other (See Comments)    GI upset  . Prednisone Nausea And Vomiting  . Sulfasalazine Hives and Other (See Comments)    GI upset  . Sulfonamide Derivatives Hives  . Metronidazole Rash  . Sulfamethoxazole Rash     Past Medical History:  Diagnosis Date  . Anemia   . Anxiety   . Arthritis    osteoarthritis  . Asthma    due to seasonal alllergies  . Atrial fibrillation ([place])   . Carpal tunnel syndrome   . Cataract   . Cervicalgia   . Collagenous colitis 2010  . COPD (chronic obstructive pulmonary disease) ([place])   . Depression    situational  . Diabetes mellitus without complication ([place])   . Dysrhythmia   . GERD (gastroesophageal reflux disease)   . GI bleed   . Headache(784.0)    migraines; 2 x month  . Heart murmur    MVP ( symptomatic)  . History of IBS   . Hyperlipemia   . Hypertension    on medication x 10 years  . Migraine   . MVP (mitral valve prolapse)   . Shortness of breath    exertional  . Sinoatrial node dysfunction (HCC)   . Sleep apnea 2007   does not use CPAP since 40 # wt loss  . Stroke ([place])   . Syncopal episodes     Review of systems:      Physical  Exam    Heart and lungs: Rhythm without rub or gallop, lungs are bilaterally clear.    HEENT: Normocephalic atraumatic eyes are anicteric    Other:    Pertinant exam for procedure: Soft mild tenderness to palpation in the lower region of the right upper quadrant.  No masses or rebound.  Bowel sounds are positive normoactive.    Planned proceedures: EGD, colonoscopy and indicated procedures. I have discussed the risks benefits and complications of procedures to include not limited to bleeding, infection, perforation and the risk of sedation and the patient wishes to proceed.    Lollie Sails, MD Gastroenterology 03/25/2019  8:48 AM

## 2019-03-25 NOTE — Anesthesia Post-op Follow-up Note (Signed)
Anesthesia QCDR form completed.        

## 2019-03-25 NOTE — Op Note (Signed)
[facility] Gastroenterology Patient Name: Caitlin Leonard Procedure Date: 03/25/2019 8:44 AM MRN: [id number] Account #: [id number] Date of Birth: [birth year] Admit Type: Outpatient Age: 73 Room: [facility] ENDO ROOM 3 Gender: Female Note Status: Finalized Procedure:            Upper GI endoscopy Indications:          Abdominal pain in the right upper quadrant, Abdominal                        pain in the right lower quadrant, Nausea, Weight loss Providers:            Lollie Sails, MD Referring MD:         Irven Easterly. Kary Kos, MD (Referring MD) Medicines:            Monitored Anesthesia Care Complications:        No immediate complications. Procedure:            Pre-Anesthesia Assessment:                       - ASA Grade Assessment: III - A patient with severe                        systemic disease.                       After obtaining informed consent, the endoscope was                        passed under direct vision. Throughout the procedure,                        the patient's blood pressure, pulse, and oxygen                        saturations were monitored continuously. The Endoscope                        was introduced through the mouth, and advanced to the                        third part of duodenum. The upper GI endoscopy was                        accomplished without difficulty. The patient tolerated                        the procedure well. Findings:      The Z-line was variable. Biopsies were taken with a cold forceps for       histology.      Bile was found in the lower third of the esophagus.      A non-bleeding diverticulum with a medium opening and no stigmata of       recent bleeding was found in the distal esophagus, 32 cm from the       incisors.      Diffuse, yellow plaques were found in the middle third of the esophagus       and in the lower third of the esophagus. Cells for cytology were       obtained by brushing.      The  examined  duodenum was normal. Biopsies were taken with a cold       forceps for histology.      A small hiatal hernia was found. The Z-line was a variable distance from       incisors; the hiatal hernia was sliding.      Diffuse mild inflammation characterized by congestion (edema) and       friability was found in the gastric body. Impression:           - Z-line variable. Biopsied.                       - Bile in the lower third of the esophagus.                       - Diverticulum in the distal esophagus.                       - Esophageal plaques were found, suspicious for                        candidiasis. Cells for cytology obtained. Recommendation:       - Await pathology results.                       - Use Aciphex (rabeprazole) 20 mg PO daily daily.                       - Use sucralfate tablets 1 gram PO QID for 1 week.                       - Use sucralfate tablets 1 gram PO BID indefinitely.                       - Return to GI clinic in 4 weeks. Procedure Code(s):    --- Professional ---                       [phone], Esophagogastroduodenoscopy, flexible, transoral;                        with biopsy, single or multiple CPT copyright 2019 American Medical Association. All rights reserved. The codes documented in this report are preliminary and upon coder review may  be revised to meet current compliance requirements. Lollie Sails, MD 03/25/2019 9:22:16 AM This report has been signed electronically. Number of Addenda: 0 Note Initiated On: 03/25/2019 8:44 AM      [facility]

## 2019-03-25 NOTE — Anesthesia Preprocedure Evaluation (Addendum)
Anesthesia Evaluation  Patient identified by MRN, date of birth, ID band Patient awake    Reviewed: Allergy & Precautions, H&P , NPO status , Patient's Chart, lab work & pertinent test results, reviewed documented beta blocker date and time   History of Anesthesia Complications Negative for: history of anesthetic complications  Airway Mallampati: II  TM Distance: >3 FB Neck ROM: full    Dental  (+) Dental Advidsory Given, Missing, Teeth Intact   Pulmonary shortness of breath and with exertion, asthma , sleep apnea (No CPAP since 40 lb weight loss) , COPD, neg recent URI,           Cardiovascular Exercise Tolerance: Good hypertension, (-) angina+ CAD  (-) Past MI and (-) Cardiac Stents + dysrhythmias Atrial Fibrillation + Valvular Problems/Murmurs MVP      Neuro/Psych  Headaches, neg Seizures PSYCHIATRIC DISORDERS Anxiety Depression CVA, No Residual Symptoms    GI/Hepatic Neg liver ROS, GERD  ,  Endo/Other  diabetes, Well Controlled  Renal/GU negative Renal ROS  negative genitourinary   Musculoskeletal   Abdominal   Peds  Hematology  (+) Blood dyscrasia, anemia ,   Anesthesia Other Findings Past Medical History: No date: Anemia No date: Anxiety No date: Arthritis     Comment:  osteoarthritis No date: Asthma     Comment:  due to seasonal alllergies No date: Atrial fibrillation (HCC) No date: Carpal tunnel syndrome No date: Cataract No date: Cervicalgia 2010: Collagenous colitis No date: COPD (chronic obstructive pulmonary disease) (HCC) No date: Depression     Comment:  situational No date: Diabetes mellitus without complication (HCC) No date: Dysrhythmia No date: GERD (gastroesophageal reflux disease) No date: GI bleed No date: Headache(784.0)     Comment:  migraines; 2 x month No date: Heart murmur     Comment:  MVP ( symptomatic) No date: History of IBS No date: Hyperlipemia No date: Hypertension   Comment:  on medication x 10 years No date: Migraine No date: MVP (mitral valve prolapse) No date: Shortness of breath     Comment:  exertional No date: Sinoatrial node dysfunction ([place]) 2007: Sleep apnea     Comment:  does not use CPAP since 40 # wt loss No date: Stroke ([place]) No date: Syncopal episodes   Reproductive/Obstetrics negative OB ROS                            Anesthesia Physical Anesthesia Plan  ASA: III  Anesthesia Plan: General   Post-op Pain Management:    Induction: Intravenous  PONV Risk Score and Plan: 3 and Propofol infusion and TIVA  Airway Management Planned: Natural Airway and Nasal Cannula  Additional Equipment:   Intra-op Plan:   Post-operative Plan:   Informed Consent: I have reviewed the patients History and Physical, chart, labs and discussed the procedure including the risks, benefits and alternatives for the proposed anesthesia with the patient or authorized representative who has indicated his/her understanding and acceptance.     Dental Advisory Given  Plan Discussed with: Anesthesiologist, CRNA and Surgeon  Anesthesia Plan Comments:         Anesthesia Quick Evaluation

## 2019-03-25 NOTE — Anesthesia Postprocedure Evaluation (Signed)
Anesthesia Post Note  Patient: Caitlin Leonard  Procedure(s) Performed: ESOPHAGOGASTRODUODENOSCOPY (EGD) WITH PROPOFOL (N/A ) COLONOSCOPY WITH PROPOFOL (N/A )  Patient location during evaluation: Endoscopy Anesthesia Type: General Level of consciousness: awake and alert Pain management: pain level controlled Vital Signs Assessment: post-procedure vital signs reviewed and stable Respiratory status: spontaneous breathing, nonlabored ventilation, respiratory function stable and patient connected to nasal cannula oxygen Cardiovascular status: blood pressure returned to baseline and stable Postop Assessment: no apparent nausea or vomiting Anesthetic complications: no     Last Vitals:  Vitals:   03/25/19 1012 03/25/19 1020  BP: 109/62 99/85  Pulse: 70 70  Resp: (!) 28 18  Temp: 36.8 C   SpO2: 92% 92%    Last Pain:  Vitals:   03/25/19 1012  TempSrc: Tympanic  PainSc: 0-No pain                 Martha Clan

## 2019-03-25 NOTE — Op Note (Signed)
[facility] Gastroenterology Patient Name: Caitlin Leonard Procedure Date: 03/25/2019 8:43 AM MRN: [id number] Account #: [id number] Date of Birth: Nov 21, 1945 Admit Type: Outpatient Age: 73 Room: [facility] ENDO ROOM 3 Gender: Female Note Status: Finalized Procedure:            Colonoscopy Indications:          Lower abdominal pain, Diarrhea, Weight loss Providers:            Lollie Sails, MD Referring MD:         Irven Easterly. Kary Kos, MD (Referring MD) Medicines:            Monitored Anesthesia Care Complications:        No immediate complications. Procedure:            Pre-Anesthesia Assessment:                       - ASA Grade Assessment: III - A patient with severe                        systemic disease.                       After obtaining informed consent, the colonoscope was                        passed under direct vision. Throughout the procedure,                        the patient's blood pressure, pulse, and oxygen                        saturations were monitored continuously. The                        Colonoscope was introduced through the anus and                        advanced to the the cecum, identified by appendiceal                        orifice and ileocecal valve. The colonoscopy was                        performed with moderate difficulty due to a redundant                        colon. Successful completion of the procedure was aided                        by changing the patient to a supine position and using                        manual pressure. The patient tolerated the procedure                        well. The quality of the bowel preparation was fair. Findings:      Many small and large-mouthed diverticula were found in the sigmoid       colon, descending colon and transverse colon.      The sigmoid colon was moderately redundant.  A 3 mm polyp was found in the descending colon. The polyp was sessile.       The polyp was  removed with a cold biopsy forceps. Resection and       retrieval were complete.      Biopsies for histology were taken with a cold forceps from the right       colon and left colon for evaluation of microscopic colitis.      The exam was otherwise normal throughout the examined colon.      The retroflexed view of the distal rectum and anal verge was normal and       showed no anal or rectal abnormalities.      The digital rectal exam was normal. Impression:           - Preparation of the colon was fair.                       - Diverticulosis in the sigmoid colon, in the                        descending colon and in the transverse colon.                       - Redundant colon.                       - One 3 mm polyp in the descending colon, removed with                        a cold biopsy forceps. Resected and retrieved.                       - The distal rectum and anal verge are normal on                        retroflexion view.                       - Biopsies were taken with a cold forceps from the                        right colon and left colon for evaluation of                        microscopic colitis. Recommendation:       - Discharge patient to home.                       - Await pathology results.                       - Return to GI clinic in 2 weeks. Procedure Code(s):    --- Professional ---                       [phone], Colonoscopy, flexible; with biopsy, single or                        multiple Diagnosis Code(s):    --- Professional ---                       K63.5, Polyp  of colon                       R10.30, Lower abdominal pain, unspecified                       R19.7, Diarrhea, unspecified                       R63.4, Abnormal weight loss                       K57.30, Diverticulosis of large intestine without                        perforation or abscess without bleeding                       Q43.8, Other specified congenital malformations of                         intestine CPT copyright 2019 American Medical Association. All rights reserved. The codes documented in this report are preliminary and upon coder review may  be revised to meet current compliance requirements. Lollie Sails, MD 03/25/2019 9:51:12 AM This report has been signed electronically. Number of Addenda: 0 Note Initiated On: 03/25/2019 8:43 AM Scope Withdrawal Time: 0 hours 0 minutes 50 seconds  Total Procedure Duration: 0 hours 21 minutes 56 seconds       [facility]

## 2019-03-25 NOTE — Transfer of Care (Signed)
Immediate Anesthesia Transfer of Care Note  Patient: Caitlin Leonard  Procedure(s) Performed: ESOPHAGOGASTRODUODENOSCOPY (EGD) WITH PROPOFOL (N/A ) COLONOSCOPY WITH PROPOFOL (N/A )  Patient Location: PACU  Anesthesia Type:General  Level of Consciousness: drowsy  Airway & Oxygen Therapy: Patient Spontanous Breathing and Patient connected to nasal cannula oxygen  Post-op Assessment: Report given to RN and Post -op Vital signs reviewed and stable  Post vital signs: Reviewed and stable  Last Vitals:  Vitals Value Taken Time  BP 87/50 03/25/19 0952  Temp    Pulse 66 03/25/19 0953  Resp 23 03/25/19 0953  SpO2 92 % 03/25/19 0953  Vitals shown include unvalidated device data.  Last Pain:  Vitals:   03/25/19 0819  TempSrc: Tympanic  PainSc: 0-No pain         Complications: No apparent anesthesia complications

## 2019-03-28 LAB — SURGICAL PATHOLOGY

## 2019-03-29 ENCOUNTER — Other Ambulatory Visit: Payer: Self-pay | Admitting: Pulmonary Disease

## 2019-03-29 ENCOUNTER — Other Ambulatory Visit: Payer: Self-pay | Admitting: Gastroenterology

## 2019-03-29 DIAGNOSIS — R7989 Other specified abnormal findings of blood chemistry: Secondary | ICD-10-CM

## 2019-03-30 MED ORDER — OMALIZUMAB 150 MG ~~LOC~~ SOLR
375.0000 mg | Freq: Once | SUBCUTANEOUS | Status: AC
  Administered 2019-03-17: 375 mg via SUBCUTANEOUS

## 2019-03-30 MED ORDER — DUPILUMAB 300 MG/2ML ~~LOC~~ SOSY: 300.0000 mg | PREFILLED_SYRINGE | Freq: Once | SUBCUTANEOUS | Status: DC

## 2019-03-30 NOTE — Addendum Note (Signed)
Addended by: Alroy Bailiff B on: 03/30/2019 12:27 PM   Modules accepted: Orders

## 2019-03-31 ENCOUNTER — Other Ambulatory Visit: Payer: Self-pay

## 2019-03-31 ENCOUNTER — Ambulatory Visit (INDEPENDENT_AMBULATORY_CARE_PROVIDER_SITE_OTHER): Payer: Medicare Other

## 2019-03-31 DIAGNOSIS — J4540 Moderate persistent asthma, uncomplicated: Secondary | ICD-10-CM | POA: Diagnosis not present

## 2019-03-31 MED ORDER — OMALIZUMAB 150 MG ~~LOC~~ SOLR
375.0000 mg | Freq: Once | SUBCUTANEOUS | Status: AC
  Administered 2019-03-31: 375 mg via SUBCUTANEOUS

## 2019-03-31 NOTE — Progress Notes (Signed)
Have you been hospitalized within the last 10 days?  Yes Pt had 2 GI procedures 03/25/2019 Do you have a fever?  No Do you have a cough?  No Do you have a headache or sore throat? No

## 2019-04-04 ENCOUNTER — Ambulatory Visit
Admission: RE | Admit: 2019-04-04 | Discharge: 2019-04-04 | Disposition: A | Discharge status: Home / Self Care | Payer: Medicare Other | Source: Ambulatory Visit | Attending: Gastroenterology | Admitting: Gastroenterology

## 2019-04-04 ENCOUNTER — Other Ambulatory Visit: Payer: Self-pay

## 2019-04-04 DIAGNOSIS — E860 Dehydration: Secondary | ICD-10-CM | POA: Diagnosis not present

## 2019-04-04 DIAGNOSIS — R945 Abnormal results of liver function studies: Secondary | ICD-10-CM | POA: Insufficient documentation

## 2019-04-04 DIAGNOSIS — R7989 Other specified abnormal findings of blood chemistry: Secondary | ICD-10-CM

## 2019-04-04 DIAGNOSIS — K52831 Collagenous colitis: Secondary | ICD-10-CM | POA: Diagnosis not present

## 2019-04-06 ENCOUNTER — Other Ambulatory Visit: Payer: Self-pay

## 2019-04-06 ENCOUNTER — Inpatient Hospital Stay
Admission: EM | Admit: 2019-04-06 | Discharge: 2019-04-10 | DRG: 392 | Disposition: A | Discharge status: Home / Self Care | Payer: Medicare Other | Attending: Internal Medicine | Admitting: Internal Medicine

## 2019-04-06 ENCOUNTER — Emergency Department: Payer: Medicare Other

## 2019-04-06 DIAGNOSIS — Z91041 Radiographic dye allergy status: Secondary | ICD-10-CM

## 2019-04-06 DIAGNOSIS — Z8673 Personal history of transient ischemic attack (TIA), and cerebral infarction without residual deficits: Secondary | ICD-10-CM

## 2019-04-06 DIAGNOSIS — E860 Dehydration: Secondary | ICD-10-CM | POA: Diagnosis present

## 2019-04-06 DIAGNOSIS — E785 Hyperlipidemia, unspecified: Secondary | ICD-10-CM | POA: Diagnosis present

## 2019-04-06 DIAGNOSIS — J449 Chronic obstructive pulmonary disease, unspecified: Secondary | ICD-10-CM | POA: Diagnosis present

## 2019-04-06 DIAGNOSIS — K52831 Collagenous colitis: Principal | ICD-10-CM

## 2019-04-06 DIAGNOSIS — Z1159 Encounter for screening for other viral diseases: Secondary | ICD-10-CM

## 2019-04-06 DIAGNOSIS — I10 Essential (primary) hypertension: Secondary | ICD-10-CM | POA: Diagnosis present

## 2019-04-06 DIAGNOSIS — R197 Diarrhea, unspecified: Secondary | ICD-10-CM

## 2019-04-06 DIAGNOSIS — K219 Gastro-esophageal reflux disease without esophagitis: Secondary | ICD-10-CM | POA: Diagnosis present

## 2019-04-06 DIAGNOSIS — Z888 Allergy status to other drugs, medicaments and biological substances status: Secondary | ICD-10-CM

## 2019-04-06 DIAGNOSIS — E876 Hypokalemia: Secondary | ICD-10-CM | POA: Diagnosis present

## 2019-04-06 DIAGNOSIS — Z8249 Family history of ischemic heart disease and other diseases of the circulatory system: Secondary | ICD-10-CM

## 2019-04-06 DIAGNOSIS — Z882 Allergy status to sulfonamides status: Secondary | ICD-10-CM

## 2019-04-06 DIAGNOSIS — R0602 Shortness of breath: Secondary | ICD-10-CM

## 2019-04-06 DIAGNOSIS — Z833 Family history of diabetes mellitus: Secondary | ICD-10-CM

## 2019-04-06 DIAGNOSIS — Z7982 Long term (current) use of aspirin: Secondary | ICD-10-CM

## 2019-04-06 DIAGNOSIS — F419 Anxiety disorder, unspecified: Secondary | ICD-10-CM | POA: Diagnosis present

## 2019-04-06 DIAGNOSIS — E119 Type 2 diabetes mellitus without complications: Secondary | ICD-10-CM | POA: Diagnosis present

## 2019-04-06 DIAGNOSIS — Z96652 Presence of left artificial knee joint: Secondary | ICD-10-CM | POA: Diagnosis present

## 2019-04-06 DIAGNOSIS — Z8349 Family history of other endocrine, nutritional and metabolic diseases: Secondary | ICD-10-CM

## 2019-04-06 LAB — COMPREHENSIVE METABOLIC PANEL
ALT: 63 U/L — ABNORMAL HIGH (ref 0–44)
AST: 95 U/L — ABNORMAL HIGH (ref 15–41)
Albumin: 3.6 g/dL (ref 3.5–5.0)
Alkaline Phosphatase: 50 U/L (ref 38–126)
Anion gap: 11 (ref 5–15)
BUN: 12 mg/dL (ref 8–23)
CO2: 25 mmol/L (ref 22–32)
Calcium: 9.4 mg/dL (ref 8.9–10.3)
Chloride: 99 mmol/L (ref 98–111)
Creatinine, Ser: 0.79 mg/dL (ref 0.44–1.00)
GFR calc Af Amer: >60 mL/min (ref >60)
GFR calc non Af Amer: >60 mL/min (ref >60)
Glucose, Bld: 145 mg/dL — ABNORMAL HIGH (ref 70–99)
Potassium: 3.6 mmol/L (ref 3.5–5.1)
Sodium: 135 mmol/L (ref 135–145)
Total Bilirubin: 1.1 mg/dL (ref 0.3–1.2)
Total Protein: 6.9 g/dL (ref 6.5–8.1)

## 2019-04-06 LAB — CBC WITH DIFFERENTIAL/PLATELET
Abs Immature Granulocytes: 0.05 10*3/uL (ref 0.00–0.07)
Basophils Absolute: 0 10*3/uL (ref 0.0–0.1)
Basophils Relative: 0 %
Eosinophils Absolute: 0 10*3/uL (ref 0.0–0.5)
Eosinophils Relative: 0 %
HCT: 42.1 % (ref 36.0–46.0)
Hemoglobin: 13.6 g/dL (ref 12.0–15.0)
Immature Granulocytes: 0 %
Lymphocytes Relative: 2 %
Lymphs Abs: 0.2 10*3/uL — ABNORMAL LOW (ref 0.7–4.0)
MCH: 31.3 pg (ref 26.0–34.0)
MCHC: 32.3 g/dL (ref 30.0–36.0)
MCV: 96.8 fL (ref 80.0–100.0)
Monocytes Absolute: 0.4 10*3/uL (ref 0.1–1.0)
Monocytes Relative: 4 %
Neutro Abs: 11.5 10*3/uL — ABNORMAL HIGH (ref 1.7–7.7)
Neutrophils Relative %: 94 %
Platelets: 215 10*3/uL (ref 150–400)
RBC: 4.35 MIL/uL (ref 3.87–5.11)
RDW: 13.2 % (ref 11.5–15.5)
WBC: 12.2 10*3/uL — ABNORMAL HIGH (ref 4.0–10.5)
nRBC: 0 % (ref 0.0–0.2)

## 2019-04-06 LAB — GASTROINTESTINAL PANEL BY PCR, STOOL (REPLACES STOOL CULTURE)

## 2019-04-06 LAB — C DIFFICILE QUICK SCREEN W PCR REFLEX
C Diff antigen: NEGATIVE
C Diff interpretation: NOT DETECTED
C Diff toxin: NEGATIVE

## 2019-04-06 LAB — TROPONIN I (HIGH SENSITIVITY): Troponin I (High Sensitivity): 10 ng/L (ref <18)

## 2019-04-06 LAB — BRAIN NATRIURETIC PEPTIDE: B Natriuretic Peptide: 29 pg/mL (ref 0.0–100.0)

## 2019-04-06 LAB — SARS CORONAVIRUS 2 BY RT PCR (HOSPITAL ORDER, PERFORMED IN ~~LOC~~ HOSPITAL LAB): SARS Coronavirus 2: NEGATIVE

## 2019-04-06 MED ORDER — SODIUM CHLORIDE 0.9 % IV BOLUS
500.0000 mL | Freq: Once | INTRAVENOUS | Status: AC
  Administered 2019-04-06: 22:00:00 500 mL via INTRAVENOUS

## 2019-04-06 MED ORDER — PROCHLORPERAZINE EDISYLATE 10 MG/2ML IJ SOLN
10.0000 mg | Freq: Once | INTRAMUSCULAR | Status: AC
  Administered 2019-04-06: 10 mg via INTRAVENOUS
  Filled 2019-04-06: qty 2

## 2019-04-06 MED ORDER — METOPROLOL TARTRATE 50 MG PO TABS
50.0000 mg | ORAL_TABLET | Freq: Two times a day (BID) | ORAL | Status: DC
  Administered 2019-04-06 – 2019-04-10 (×8): 50 mg via ORAL
  Filled 2019-04-06 (×8): qty 1

## 2019-04-06 MED ORDER — SODIUM CHLORIDE 0.9 % IV BOLUS
500.0000 mL | Freq: Once | INTRAVENOUS | Status: AC
  Administered 2019-04-06: 500 mL via INTRAVENOUS

## 2019-04-06 MED ORDER — LOPERAMIDE HCL 2 MG PO CAPS
2.0000 mg | ORAL_CAPSULE | Freq: Once | ORAL | Status: AC
  Administered 2019-04-06: 2 mg via ORAL
  Filled 2019-04-06: qty 1

## 2019-04-06 NOTE — ED Provider Notes (Signed)
CT imaging does not show any evidence obstructive pattern but does show evidence of extensive enteritis.  Stool studies were negative but essentially anytime patient has something to eat she has large volume diarrhea.  As she is having 14-16 episodes of diarrhea daily with tachycardia and dehydration weakness will discuss with hospitalist for admission for IV fluids and further medical management.  Will give Imodium to decrease fluid losses.  Have discussed with the patient and available family all diagnostics and treatments performed thus far and all questions were answered to the best of my ability. The patient demonstrates understanding and agreement with plan.   Merlyn Lot, MD 04/06/19 2245

## 2019-04-06 NOTE — ED Triage Notes (Signed)
C/o diarrhea X 48 hours, emesis X 36hours. HR 120-130 a fib with EMS. PIV started by EMS and 150cc NS given. Pt was sitting on her walker today when she felt too weak to get up, causing her to fall in the floor. Pt alert and oriented X4, active, cooperative, pt in NAD. RR even and unlabored, color WNL.

## 2019-04-06 NOTE — ED Provider Notes (Signed)
[facility] Emergency Department Provider Note  ____________________________________________   First MD Initiated Contact with Patient 04/06/19 1726     (approximate)  I have reviewed the triage vital signs and the nursing notes.   HISTORY  Chief Complaint Diarrhea and Emesis   HPI Caitlin LOOMAN is a 73 y.o. female who presents to the emergency department for treatment and evaluation of diarrhea with nausea and vomiting.  Diarrhea started 2 days ago, vomiting started day and a half ago.  Patient is followed by Dr. Gustavo Lah and had a EGD and colonoscopy for diarrhea and bilateral lower abdominal pain on March 25, 2019.  Patient been feeling well until 2 days ago.  She denies fever.     Past Medical History:  Diagnosis Date  . Anemia   . Anxiety   . Arthritis    osteoarthritis  . Asthma    due to seasonal alllergies  . Atrial fibrillation ([place])   . Carpal tunnel syndrome   . Cataract   . Cervicalgia   . Collagenous colitis 2010  . COPD (chronic obstructive pulmonary disease) ([place])   . Depression    situational  . Diabetes mellitus without complication ([place])   . Dysrhythmia   . GERD (gastroesophageal reflux disease)   . GI bleed   . Headache(784.0)    migraines; 2 x month  . Heart murmur    MVP ( symptomatic)  . History of IBS   . Hyperlipemia   . Hypertension    on medication x 10 years  . Migraine   . MVP (mitral valve prolapse)   . Shortness of breath    exertional  . Sinoatrial node dysfunction (H[facility])   . Sleep apnea 2007   does not use CPAP since 40 # wt loss  . Stroke ([place])   . Syncopal episodes     Patient Active Problem List   Diagnosis Date Noted  . Dehydration 04/07/2019  . Leg swelling 03/19/2016  . Essential hypertension 03/19/2016  . Obesity 09/10/2015  . Bronchitis, acute 04/11/2015  . OSA (obstructive sleep apnea) 10/18/2014  . Nodule of right lung 06/14/2014  . Dyspnea 06/13/2014  . Coronary artery  calcification 04/17/2014  . Aneurysm, ascending aorta ([place]) 04/17/2014  . Chronic cough 02/24/2014  . Mold suspected exposure 02/24/2014    Past Surgical History:  Procedure Laterality Date  . ABDOMINAL HYSTERECTOMY  1979  . BACK SURGERY    . BREAST CYST ASPIRATION Right 25 plus yrs ago  . CARDIAC CATHETERIZATION  2012   [facility]  . CHOLECYSTECTOMY  2011  . COLONOSCOPY WITH PROPOFOL N/A 03/25/2019   Procedure: COLONOSCOPY WITH PROPOFOL;  Surgeon: Lollie Sails, MD;  Location: [facility] ENDOSCOPY;  Service: Endoscopy;  Laterality: N/A;  . ESOPHAGOGASTRODUODENOSCOPY (EGD) WITH PROPOFOL N/A 03/25/2019   Procedure: ESOPHAGOGASTRODUODENOSCOPY (EGD) WITH PROPOFOL;  Surgeon: Lollie Sails, MD;  Location: [facility] ENDOSCOPY;  Service: Endoscopy;  Laterality: N/A;  . FLEXIBLE BRONCHOSCOPY N/A 10/10/2016   Procedure: FLEXIBLE BRONCHOSCOPY;  Surgeon: [name], MD;  Location: [facility] ORS;  Service: Pulmonary;  Laterality: N/A;  . FLEXIBLE BRONCHOSCOPY N/A 12/11/2017   Procedure: FLEXIBLE BRONCHOSCOPY;  Surgeon: [name], MD;  Location: [facility] ORS;  Service: Pulmonary;  Laterality: N/A;  . [place], 2012 x 2   bilateral  . LUMBAR LAMINECTOMY  5366,4403  . NASAL SINUS SURGERY  1994  . TONSILLECTOMY    . TOTAL KNEE ARTHROPLASTY  12/22/2011  Procedure: TOTAL KNEE ARTHROPLASTY;  Surgeon: Rudean Haskell, MD;  Location: [place];  Service: Orthopedics;  Laterality: Left;    Prior to Admission medications   Medication Sig Start Date End Date Taking? Authorizing Provider  albuterol (PROVENTIL HFA;VENTOLIN HFA) 108 (90 Base) MCG/ACT inhaler Inhale 1-2 puffs into the lungs every 6 (six) hours as needed for wheezing or shortness of breath. 06/08/18  Yes [name], MD  ALPRAZolam Duanne Moron) 0.5 MG tablet Take 0.5 mg by mouth at bedtime.    Yes [provider]  aspirin EC 81 MG tablet Take 81 mg by mouth daily.   Yes [provider]  Biotin  1000 MCG tablet Take 1,000 mcg by mouth 3 (three) times daily.   Yes [provider]  budesonide (ENTOCORT EC) 3 MG 24 hr capsule TAKE 1 CAPSULE(3 MG) BY MOUTH DAILY 09/30/17  Yes Milus Banister, MD  carboxymethylcellulose (REFRESH PLUS) 0.5 % SOLN Place 1 drop into both eyes 2 (two) times daily.    Yes [provider]  CARTIA XT 180 MG 24 hr capsule TAKE 1 CAPSULE(180 MG) BY MOUTH DAILY 02/07/16  Yes Gollan, Kathlene November, MD  chlorpheniramine-HYDROcodone (TUSSIONEX PENNKINETIC ER) 10-8 MG/5ML SUER Take 5 mLs by mouth at bedtime as needed for cough. 02/07/19  Yes [name], MD  diltiazem (DILACOR XR) 180 MG 24 hr capsule Take 180 mg by mouth daily.   Yes [provider]  EPIPEN 2-PAK 0.3 MG/0.3ML SOAJ injection Inject 0.3 mg into the muscle once.    Yes [provider]  glucose blood test strip 1 each by Other route as needed for other. Use as instructed   Yes [provider]  hydrochlorothiazide (HYDRODIURIL) 12.5 MG tablet Take 12.5 mg by mouth daily. 03/15/18  Yes [provider]  Ibuprofen-diphenhydrAMINE Cit (ADVIL PM) 200-38 MG TABS Take 3 tablets by mouth at bedtime.   Yes [provider]  Melatonin 5 MG TABS Take 5 mg by mouth at bedtime.   Yes [provider]  memantine (NAMENDA) 5 MG tablet Take 5 mg by mouth 2 (two) times daily. 03/13/19 03/12/20 Yes [provider]  mesalamine (LIALDA) 1.2 g EC tablet Take 4.8 g by mouth daily with breakfast. 04/04/19 05/04/19 Yes [provider]  metoprolol tartrate (LOPRESSOR) 50 MG tablet TAKE 1 TABLET(50 MG) BY MOUTH TWICE DAILY 11/24/18  Yes [name], MD  Omalizumab Arvid Right) 150 MG/ML SOSY Inject into the skin.   Yes [provider]  ondansetron (ZOFRAN-ODT) 4 MG disintegrating tablet Take 4 mg by mouth every 8 (eight) hours as needed for nausea or vomiting.   Yes [provider]  potassium chloride (K-DUR) 10 MEQ tablet Take 10 mEq by mouth  2 (two) times daily. 03/30/19 03/29/20 Yes [provider]  prazosin (MINIPRESS) 1 MG capsule Take 1 mg by mouth at bedtime. 03/11/19 03/10/20 Yes [provider]  RABEprazole (ACIPHEX) 20 MG tablet Take 20 mg by mouth daily. 22mnutes prior to a meal. 03/25/19  Yes [provider]  rosuvastatin (CRESTOR) 40 MG tablet Take 1 tablet (40 mg total) by mouth daily. 11/11/16 04/06/19 Yes Gollan, TKathlene November MD  sertraline (ZOLOFT) 100 MG tablet Take 150 mg by mouth daily.    Yes [provider]  sucralfate (CARAFATE) 1 g tablet Take 1 g by mouth 2 (two) times daily. 03/25/19  Yes [provider]  vitamin B-12 (CYANOCOBALAMIN) 1000 MCG tablet Take 1,000 mcg by mouth daily.   Yes  [provider]  Vitamin D, Ergocalciferol, (DRISDOL) 50000 units CAPS capsule Take 50,000 Units by mouth every Saturday.  10/08/16  Yes [provider]  vitamin E 600 UNIT capsule Take 600 Units by mouth 2 (two) times daily.   Yes [provider]  diclofenac (VOLTAREN) 75 MG EC tablet Take 75 mg by mouth 2 (two) times daily.    [provider]  hyoscyamine (LEVSIN) 0.125 MG tablet Take 0.125 mg by mouth every 4 (four) hours as needed.    [provider]    Allergies Contrast media [iodinated diagnostic agents], Gadolinium derivatives, Gadoversetamide, Shellfish allergy, Sulfa antibiotics, Hydroxychloroquine sulfate, Banana, Limonene, Morphine and related, Nabumetone, Otezla [apremilast], Potassium-containing compounds, Prednisone, Sulfasalazine, Sulfonamide derivatives, Metronidazole, and Sulfamethoxazole  Family History  Problem Relation Age of Onset  . Hypertension Mother   . Hyperlipidemia Mother   . Diabetes Mother   . Hypertension Father   . Hyperlipidemia Father   . Arrhythmia Father        A-Fib  . Diabetes Paternal Uncle   . Diabetes Paternal Grandfather   . Breast cancer Maternal Aunt        great in late 25's  . Anesthesia problems  Neg Hx   . Colon cancer Neg Hx   . Stomach cancer Neg Hx   . Rectal cancer Neg Hx   . Liver cancer Neg Hx   . Esophageal cancer Neg Hx     Social History Social History   Tobacco Use  . Smoking status: Never Smoker  . Smokeless tobacco: Never Used  Substance Use Topics  . Alcohol use: Yes    Alcohol/week: 1.0 standard drinks    Types: 1 Glasses of wine per week    Comment: occasional wine - less than once a month  . Drug use: Never    Review of Systems  Constitutional: No fever/chills Eyes: No visual changes. ENT: No sore throat. Cardiovascular: Denies chest pain. Respiratory: Denies shortness of breath. Gastrointestinal: Positive for abdominal pain. Positive for nausea, vomiting, and diarrhea. Genitourinary: Negative for dysuria. Musculoskeletal: Negative for back pain. Skin: Negative for rash. Neurological: Negative for headaches, focal weakness or numbness. ___________________________________________   PHYSICAL EXAM:  VITAL SIGNS: ED Triage Vitals  Enc Vitals Group     BP 04/06/19 1719 119/72     Pulse Rate 04/06/19 1719 (!) 117     Resp 04/06/19 1719 18     Temp 04/06/19 1719 98.1 F (36.7 C)     Temp Source 04/06/19 1719 Oral     SpO2 04/06/19 1719 96 %     Weight 04/06/19 1720 245 lb (111.1 kg)     Height 04/06/19 1720 _0  (1.727 m)     Head Circumference --      Peak Flow --      Pain Score 04/06/19 1720 0     Pain Loc --      Pain Edu? --      Excl. in [place]? --     Constitutional: Alert and oriented. Ill appearing and in no acute distress. Eyes: Conjunctivae are normal. PERRL. EOMI. Head: Atraumatic. Nose: No congestion/rhinnorhea. Mouth/Throat: Mucous membranes are moist.  Oropharynx non-erythematous. Neck: No stridor.   Cardiovascular: Normal rate, regular rhythm. Grossly normal heart sounds.  Good peripheral circulation. Respiratory: Normal respiratory effort.  No retractions. Lungs CTAB. Gastrointestinal: Soft and nontender. No  distention.  Bowel sounds active and present x4 quadrants.. Musculoskeletal: No lower extremity tenderness nor edema.  No joint effusions. Neurologic:  Normal speech and language. No gross focal neurologic deficits are appreciated. No gait instability. Skin:  Skin is warm, dry and intact. No rash noted. Psychiatric: Mood and affect are normal. Speech and behavior are normal.  ____________________________________________   LABS (all labs ordered are listed, but only abnormal results are displayed)  Labs Reviewed  COMPREHENSIVE METABOLIC PANEL - Abnormal; Notable for the following components:      Result Value   Glucose, Bld 145 (*)    AST 95 (*)    ALT 63 (*)    All other components within normal limits  CBC WITH DIFFERENTIAL/PLATELET - Abnormal; Notable for the following components:   WBC 12.2 (*)    Neutro Abs 11.5 (*)    Lymphs Abs 0.2 (*)    All other components within normal limits  BASIC METABOLIC PANEL - Abnormal; Notable for the following components:   Potassium 3.0 (*)    Glucose, Bld 109 (*)    All other components within normal limits  CBC - Abnormal; Notable for the following components:   RBC 3.81 (*)    All other components within normal limits  GASTROINTESTINAL PANEL BY PCR, STOOL (REPLACES STOOL CULTURE)  C DIFFICILE QUICK SCREEN W PCR REFLEX  SARS CORONAVIRUS 2 (HOSPITAL ORDER, PERFORMED IN [facility])  BRAIN NATRIURETIC PEPTIDE  TROPONIN I (HIGH SENSITIVITY)  TSH  URINALYSIS, COMPLETE (UACMP) WITH MICROSCOPIC  URINALYSIS, COMPLETE (UACMP) WITH MICROSCOPIC   ____________________________________________  EKG  ED ECG REPORT I, Chez Bulnes, FNP-BC personally viewed and interpreted this ECG.   Date: 04/06/2019  EKG Time: 1727  Rate: 115  Rhythm: sinus tachycardia  Axis: no deviation.  Intervals: unremarkable  ST&T Change: Repolarization abnormality.  No ST elevation in contiguous leads  ____________________________________________   RADIOLOGY  ED MD interpretation: Chest x-ray negative for acute findings  Official radiology report(s): Ct Abdomen Pelvis Wo Contrast  Result Date: 04/06/2019 CLINICAL DATA:  Diarrhea EXAM: CT ABDOMEN AND PELVIS WITHOUT CONTRAST TECHNIQUE: Multidetector CT imaging of the abdomen and pelvis was performed following the standard protocol without IV contrast. COMPARISON:  CT 02/28/2010 FINDINGS: Lower chest: Lung bases demonstrate no acute consolidation or effusion. The heart size is normal. Hepatobiliary: No focal liver abnormality is seen. Status post cholecystectomy. No biliary dilatation. Pancreas: Unremarkable. No pancreatic ductal dilatation or surrounding inflammatory changes. Spleen: Borderline enlarged Adrenals/Urinary Tract: Adrenal glands are unremarkable. Kidneys are normal, without renal calculi, focal lesion, or hydronephrosis. Bladder is unremarkable. Stomach/Bowel: The stomach is within normal limits. No dilated small bowel. Liquid stools in the colon. Scattered sigmoid colon diverticula. No acute colon wall thickening. Vascular/Lymphatic: Moderate aortic atherosclerosis. No aneurysm. Scattered mesenteric and right lower quadrant lymph nodes. Reproductive: Status post hysterectomy. No adnexal masses. Other: Negative for free air or free fluid Musculoskeletal: Degenerative changes. No acute or suspicious abnormality. IMPRESSION: 1. Diffuse liquid stools in the colon consistent with diarrheal illness. No colon wall thickening. 2. Scattered sigmoid colon diverticula without acute inflammatory change. Electronically Signed   By: Donavan Foil M.D.   On: 04/06/2019 22:06   Dg Chest Port 1 View  Result Date: 04/06/2019 CLINICAL DATA:  Diarrhea and vomiting for several days EXAM: PORTABLE CHEST 1 VIEW COMPARISON:  10/07/2018 FINDINGS: The heart size and mediastinal contours are within normal limits. Both lungs are clear. The visualized skeletal structures are unremarkable. IMPRESSION: No active  disease. Electronically Signed   By: Inez Catalina M.D.   On: 04/06/2019 18:10    ____________________________________________   PROCEDURES  Procedure(s) performed (  including Critical Care):  Procedures ____________________________________________   INITIAL IMPRESSION / ASSESSMENT AND PLAN / ED COURSE  As part of my medical decision making, I reviewed the following data within the electronic MEDICAL RECORD NUMBER Notes from prior ED visits   73 year old female presenting to the emergency department for treatment and evaluation of diarrhea with nausea and vomiting.  She recently had a colonoscopy and EGD.  Diverticulosis noted in the sigmoid and transverse colon polyps were taken.  No pathology report is found on chart review.  She also had ultrasound of the abdomen due to increased liver enzymes which showed a fatty infiltrate or diffuse hepatocellular disease not specifically specified.  Patient states that over the past couple of days, she has had about 15 episodes of very watery diarrhea.  She has not noted any blood.  She has been unable to keep any food or fluids down over the past 36 hours.  The patient and her husband have been in contact with the gastroenterology office and was advised to come to the emergency department today for evaluation due to concerns of dehydration.  ----------------------------------------- 8:18 PM on 04/06/2019 ----------------------------------------- Stool specimen was sent to lab for testing for C. difficile and is negative.  Remainder of the GI panel is still pending.  CBC shows a mild leukocytosis at 12.2 with a neutrophil count of 11.5.  Sodium, potassium, glucose, BUN, and creatinine are all reassuring.  LFTs are elevated, however this is not a new finding.  COVID-19 test is still pending as is the BN P.  ----------------------------------------- 8:55 PM on 04/06/2019 ----------------------------------------- Patient care transferred to Dr. Quentin Cornwall who  will follow up on remaining test results and decide on disposition.  Clinical Course as of Apr 07 1351  Wed Apr 06, 2019  2018 Gastrointestinal Panel by PCR , Stool [CT]    Clinical Course User Index [CT] Ryann Leavitt B, FNP     ____________________________________________   FINAL CLINICAL IMPRESSION(S) / ED DIAGNOSES  Final diagnoses:  Diarrhea, unspecified type  Dehydration     ED Discharge Orders    None       Note:  This document was prepared using Dragon voice recognition software and may include unintentional dictation errors.    Victorino Dike, FNP 04/07/19 1353    Merlyn Lot, MD 04/12/19 [phone]

## 2019-04-06 NOTE — ED Notes (Signed)
Pt cleaned up after having incontinence episode.

## 2019-04-07 ENCOUNTER — Other Ambulatory Visit: Payer: Self-pay

## 2019-04-07 DIAGNOSIS — Z833 Family history of diabetes mellitus: Secondary | ICD-10-CM | POA: Diagnosis not present

## 2019-04-07 DIAGNOSIS — Z91041 Radiographic dye allergy status: Secondary | ICD-10-CM | POA: Diagnosis not present

## 2019-04-07 DIAGNOSIS — Z8673 Personal history of transient ischemic attack (TIA), and cerebral infarction without residual deficits: Secondary | ICD-10-CM | POA: Diagnosis not present

## 2019-04-07 DIAGNOSIS — J449 Chronic obstructive pulmonary disease, unspecified: Secondary | ICD-10-CM | POA: Diagnosis present

## 2019-04-07 DIAGNOSIS — K219 Gastro-esophageal reflux disease without esophagitis: Secondary | ICD-10-CM | POA: Diagnosis present

## 2019-04-07 DIAGNOSIS — Z888 Allergy status to other drugs, medicaments and biological substances status: Secondary | ICD-10-CM | POA: Diagnosis not present

## 2019-04-07 DIAGNOSIS — E860 Dehydration: Secondary | ICD-10-CM | POA: Diagnosis present

## 2019-04-07 DIAGNOSIS — E119 Type 2 diabetes mellitus without complications: Secondary | ICD-10-CM | POA: Diagnosis present

## 2019-04-07 DIAGNOSIS — Z8349 Family history of other endocrine, nutritional and metabolic diseases: Secondary | ICD-10-CM | POA: Diagnosis not present

## 2019-04-07 DIAGNOSIS — Z7982 Long term (current) use of aspirin: Secondary | ICD-10-CM | POA: Diagnosis not present

## 2019-04-07 DIAGNOSIS — F419 Anxiety disorder, unspecified: Secondary | ICD-10-CM | POA: Diagnosis present

## 2019-04-07 DIAGNOSIS — Z1159 Encounter for screening for other viral diseases: Secondary | ICD-10-CM | POA: Diagnosis not present

## 2019-04-07 DIAGNOSIS — Z96652 Presence of left artificial knee joint: Secondary | ICD-10-CM | POA: Diagnosis present

## 2019-04-07 DIAGNOSIS — Z8249 Family history of ischemic heart disease and other diseases of the circulatory system: Secondary | ICD-10-CM | POA: Diagnosis not present

## 2019-04-07 DIAGNOSIS — K52831 Collagenous colitis: Secondary | ICD-10-CM | POA: Diagnosis present

## 2019-04-07 DIAGNOSIS — E785 Hyperlipidemia, unspecified: Secondary | ICD-10-CM | POA: Diagnosis present

## 2019-04-07 DIAGNOSIS — Z882 Allergy status to sulfonamides status: Secondary | ICD-10-CM | POA: Diagnosis not present

## 2019-04-07 DIAGNOSIS — E876 Hypokalemia: Secondary | ICD-10-CM | POA: Diagnosis present

## 2019-04-07 DIAGNOSIS — I10 Essential (primary) hypertension: Secondary | ICD-10-CM | POA: Diagnosis present

## 2019-04-07 LAB — BASIC METABOLIC PANEL
Anion gap: 10 (ref 5–15)
BUN: 13 mg/dL (ref 8–23)
CO2: 24 mmol/L (ref 22–32)
Calcium: 9.3 mg/dL (ref 8.9–10.3)
Chloride: 103 mmol/L (ref 98–111)
Creatinine, Ser: 0.63 mg/dL (ref 0.44–1.00)
GFR calc Af Amer: >60 mL/min (ref >60)
GFR calc non Af Amer: >60 mL/min (ref >60)
Glucose, Bld: 109 mg/dL — ABNORMAL HIGH (ref 70–99)
Potassium: 3 mmol/L — ABNORMAL LOW (ref 3.5–5.1)
Sodium: 137 mmol/L (ref 135–145)

## 2019-04-07 LAB — CBC
HCT: 36.1 % (ref 36.0–46.0)
Hemoglobin: 12 g/dL (ref 12.0–15.0)
MCH: 31.5 pg (ref 26.0–34.0)
MCHC: 33.2 g/dL (ref 30.0–36.0)
MCV: 94.8 fL (ref 80.0–100.0)
Platelets: 164 10*3/uL (ref 150–400)
RBC: 3.81 MIL/uL — ABNORMAL LOW (ref 3.87–5.11)
RDW: 13.4 % (ref 11.5–15.5)
WBC: 9.6 10*3/uL (ref 4.0–10.5)
nRBC: 0 % (ref 0.0–0.2)

## 2019-04-07 LAB — TSH: TSH: 4.175 u[IU]/mL (ref 0.350–4.500)

## 2019-04-07 MED ORDER — MEMANTINE HCL 5 MG PO TABS
5.0000 mg | ORAL_TABLET | Freq: Two times a day (BID) | ORAL | Status: DC
  Administered 2019-04-07 – 2019-04-10 (×7): 5 mg via ORAL
  Filled 2019-04-07 (×7): qty 1

## 2019-04-07 MED ORDER — SODIUM CHLORIDE 0.9 % IV SOLN
INTRAVENOUS | Status: DC
  Administered 2019-04-07 – 2019-04-10 (×9): via INTRAVENOUS

## 2019-04-07 MED ORDER — ONDANSETRON HCL 4 MG/2ML IJ SOLN: 4.0000 mg | Freq: Four times a day (QID) | INTRAMUSCULAR | Status: DC | PRN

## 2019-04-07 MED ORDER — POTASSIUM CHLORIDE CRYS ER 10 MEQ PO TBCR
10.0000 meq | EXTENDED_RELEASE_TABLET | Freq: Two times a day (BID) | ORAL | Status: DC
  Administered 2019-04-07 – 2019-04-10 (×7): 10 meq via ORAL
  Filled 2019-04-07 (×7): qty 1

## 2019-04-07 MED ORDER — POTASSIUM CHLORIDE CRYS ER 20 MEQ PO TBCR
40.0000 meq | EXTENDED_RELEASE_TABLET | Freq: Once | ORAL | Status: AC
  Administered 2019-04-07: 40 meq via ORAL
  Filled 2019-04-07: qty 2

## 2019-04-07 MED ORDER — MESALAMINE 1.2 G PO TBEC
4.8000 g | DELAYED_RELEASE_TABLET | Freq: Every day | ORAL | Status: DC
  Administered 2019-04-07 – 2019-04-10 (×4): 4.8 g via ORAL
  Filled 2019-04-07 (×4): qty 4

## 2019-04-07 MED ORDER — DIPHENOXYLATE-ATROPINE 2.5-0.025 MG PO TABS
1.0000 | ORAL_TABLET | Freq: Four times a day (QID) | ORAL | Status: DC | PRN
  Administered 2019-04-07 – 2019-04-09 (×4): 1 via ORAL
  Filled 2019-04-07 (×5): qty 1

## 2019-04-07 MED ORDER — ENOXAPARIN SODIUM 40 MG/0.4ML ~~LOC~~ SOLN
40.0000 mg | SUBCUTANEOUS | Status: DC
  Administered 2019-04-07 – 2019-04-10 (×4): 40 mg via SUBCUTANEOUS
  Filled 2019-04-07 (×4): qty 0.4

## 2019-04-07 MED ORDER — SERTRALINE HCL 50 MG PO TABS
150.0000 mg | ORAL_TABLET | Freq: Every day | ORAL | Status: DC
  Administered 2019-04-07 – 2019-04-10 (×4): 150 mg via ORAL
  Filled 2019-04-07 (×4): qty 3

## 2019-04-07 MED ORDER — METOPROLOL TARTRATE 50 MG PO TABS: 50.0000 mg | ORAL_TABLET | Freq: Two times a day (BID) | ORAL | Status: DC

## 2019-04-07 MED ORDER — DILTIAZEM HCL ER COATED BEADS 180 MG PO CP24
180.0000 mg | ORAL_CAPSULE | Freq: Every day | ORAL | Status: DC
  Administered 2019-04-07 – 2019-04-10 (×4): 180 mg via ORAL
  Filled 2019-04-07 (×4): qty 1

## 2019-04-07 MED ORDER — SODIUM CHLORIDE 0.9% FLUSH
3.0000 mL | Freq: Two times a day (BID) | INTRAVENOUS | Status: DC
  Administered 2019-04-07 – 2019-04-10 (×3): 3 mL via INTRAVENOUS

## 2019-04-07 MED ORDER — ONDANSETRON HCL 4 MG PO TABS: 4.0000 mg | ORAL_TABLET | Freq: Four times a day (QID) | ORAL | Status: DC | PRN

## 2019-04-07 MED ORDER — LOPERAMIDE HCL 2 MG PO CAPS
2.0000 mg | ORAL_CAPSULE | ORAL | Status: DC | PRN
  Administered 2019-04-07 (×3): 2 mg via ORAL
  Filled 2019-04-07 (×3): qty 1

## 2019-04-07 MED ORDER — DILTIAZEM HCL ER 180 MG PO CP24: 180.0000 mg | ORAL_CAPSULE | Freq: Every day | ORAL | Status: DC

## 2019-04-07 MED ORDER — ROSUVASTATIN CALCIUM 10 MG PO TABS
40.0000 mg | ORAL_TABLET | Freq: Every day | ORAL | Status: DC
  Administered 2019-04-07 – 2019-04-09 (×3): 40 mg via ORAL
  Filled 2019-04-07 (×3): qty 4

## 2019-04-07 MED ORDER — MELATONIN 5 MG PO TABS
5.0000 mg | ORAL_TABLET | Freq: Every day | ORAL | Status: DC
  Administered 2019-04-07 – 2019-04-09 (×3): 5 mg via ORAL
  Filled 2019-04-07 (×4): qty 1

## 2019-04-07 MED ORDER — ASPIRIN EC 81 MG PO TBEC
81.0000 mg | DELAYED_RELEASE_TABLET | Freq: Every day | ORAL | Status: DC
  Administered 2019-04-07 – 2019-04-10 (×4): 81 mg via ORAL
  Filled 2019-04-07 (×4): qty 1

## 2019-04-07 MED ORDER — SUCRALFATE 1 G PO TABS
1.0000 g | ORAL_TABLET | Freq: Two times a day (BID) | ORAL | Status: DC
  Administered 2019-04-07 – 2019-04-10 (×7): 1 g via ORAL
  Filled 2019-04-07 (×7): qty 1

## 2019-04-07 NOTE — Progress Notes (Signed)
Patient states that she's had "13 BMs tonight...the medication isn't working.Rene Paci taken Immodium for months and it doesn't work." [employer], [place] notified; acknowledged; new order written. Barbaraann Faster, RN 10:45 PM 04/07/2019

## 2019-04-07 NOTE — H&P (Signed)
[facility] [place] NAME: Caitlin Leonard    MR#:  [id number]  DATE OF BIRTH:  Nov 17, 1945  DATE OF ADMISSION:  04/06/2019  PRIMARY CARE PHYSICIAN: Maryland Pink, MD   REQUESTING/REFERRING PHYSICIAN: Merlyn Lot, MD CHIEF COMPLAINT:   Chief Complaint  Patient presents with  . Diarrhea  . Emesis    HISTORY OF PRESENT ILLNESS:  Caitlin Leonard  is a 73 y.o. female with a known history of intractable diarrhea present for 48 hours as well as a 36-hour period of intermittent nausea and vomiting.  She presented to the emergency room via EMS services for severe weakness unable to get up from her sitting walker.  She reported copious amounts of diarrhea over the last 2 days as well as emesis over the last 36 hours.  Diarrhea is noted to be watery however she has noted no dark stools or evidence of blood.  She denies noting hematemesis.  She is complaining of abdominal cramping associated with diarrhea.  She had noted mild palpitations with heart rate 1 20-1 30 on arrival of EMS receiving normal saline bolus prior to her arrival.  She denies chest pain or increase shortness of breath.  She has noted intermittent headache.  C. difficile collected in the emergency room which was negative.  Patient received Imodium at that time to control fluid loss.  She has been admitted to the hospitalist service for intractable diarrhea with dehydration weakness.  PAST MEDICAL HISTORY:   Past Medical History:  Diagnosis Date  . Anemia   . Anxiety   . Arthritis    osteoarthritis  . Asthma    due to seasonal alllergies  . Atrial fibrillation ([place])   . Carpal tunnel syndrome   . Cataract   . Cervicalgia   . Collagenous colitis 2010  . COPD (chronic obstructive pulmonary disease) ([place])   . Depression    situational  . Diabetes mellitus without complication ([place])   . Dysrhythmia   . GERD (gastroesophageal reflux disease)   . GI bleed   .  Headache(784.0)    migraines; 2 x month  . Heart murmur    MVP ( symptomatic)  . History of IBS   . Hyperlipemia   . Hypertension    on medication x 10 years  . Migraine   . MVP (mitral valve prolapse)   . Shortness of breath    exertional  . Sinoatrial node dysfunction (H[facility])   . Sleep apnea 2007   does not use CPAP since 40 # wt loss  . Stroke ([place])   . Syncopal episodes     PAST SURGICAL HISTORY:   Past Surgical History:  Procedure Laterality Date  . ABDOMINAL HYSTERECTOMY  1979  . BACK SURGERY    . BREAST CYST ASPIRATION Right 25 plus yrs ago  . CARDIAC CATHETERIZATION  2012   [facility]  . CHOLECYSTECTOMY  2011  . COLONOSCOPY WITH PROPOFOL N/A 03/25/2019   Procedure: COLONOSCOPY WITH PROPOFOL;  Surgeon: Lollie Sails, MD;  Location: [facility] ENDOSCOPY;  Service: Endoscopy;  Laterality: N/A;  . ESOPHAGOGASTRODUODENOSCOPY (EGD) WITH PROPOFOL N/A 03/25/2019   Procedure: ESOPHAGOGASTRODUODENOSCOPY (EGD) WITH PROPOFOL;  Surgeon: Lollie Sails, MD;  Location: [facility] ENDOSCOPY;  Service: Endoscopy;  Laterality: N/A;  . FLEXIBLE BRONCHOSCOPY N/A 10/10/2016   Procedure: FLEXIBLE BRONCHOSCOPY;  Surgeon: [name], MD;  Location: [facility] ORS;  Service: Pulmonary;  Laterality: N/A;  . FLEXIBLE BRONCHOSCOPY N/A 12/11/2017   Procedure: FLEXIBLE BRONCHOSCOPY;  Surgeon: [name], MD;  Location: [facility] ORS;  Service: Pulmonary;  Laterality: N/A;  . [place], 2012 x 2   bilateral  . LUMBAR LAMINECTOMY  4665,[id number]  . NASAL SINUS SURGERY  1994  . TONSILLECTOMY    . TOTAL KNEE ARTHROPLASTY  12/22/2011   Procedure: TOTAL KNEE ARTHROPLASTY;  Surgeon: Rudean Haskell, MD;  Location: [place];  Service: Orthopedics;  Laterality: Left;    SOCIAL HISTORY:   Social History   Tobacco Use  . Smoking status: Never Smoker  . Smokeless tobacco: Never Used  Substance Use Topics  . Alcohol use: Yes    Alcohol/week: 1.0 standard drinks    Types: 1  Glasses of wine per week    Comment: occasional wine - less than once a month    FAMILY HISTORY:   Family History  Problem Relation Age of Onset  . Hypertension Mother   . Hyperlipidemia Mother   . Diabetes Mother   . Hypertension Father   . Hyperlipidemia Father   . Arrhythmia Father        A-Fib  . Diabetes Paternal Uncle   . Diabetes Paternal Grandfather   . Breast cancer Maternal Aunt        great in late 18's  . Anesthesia problems Neg Hx   . Colon cancer Neg Hx   . Stomach cancer Neg Hx   . Rectal cancer Neg Hx   . Liver cancer Neg Hx   . Esophageal cancer Neg Hx     DRUG ALLERGIES:   Allergies  Allergen Reactions  . Contrast Media [Iodinated Diagnostic Agents] Anaphylaxis  . Gadolinium Derivatives Anaphylaxis  . Gadoversetamide Anaphylaxis  . Shellfish Allergy Nausea And Vomiting and Other (See Comments)    Can eat shrimp but not oysters Dizziness,severe nausea and vomiting ( can eat shrimp CAN NOT EAT ORYSTERS) Other reaction(s): Other (See Comments) Dizziness,severe nausea and vomiting ( can eat shrimp CAN NOT EAT ORYSTERS)  Can eat shrimp but not oysters Dizziness,severe nausea and vomiting ( can eat shrimp CAN NOT EAT ORYSTERS)  Can eat shrimp but not oysters   . Sulfa Antibiotics Hives and Other (See Comments)    GI upset GI upset GI upset  GI upset GI upset GI upset Other reaction(s): Other (See Comments) GI upset GI upset  GI upset GI upset   . Hydroxychloroquine Sulfate Hives and Other (See Comments)    Severe hives, all over like chicken pox.  . Banana Nausea And Vomiting and Other (See Comments)    Raw bananas  . Limonene Other (See Comments)    GI upset GI upset   . Morphine And Related   . Nabumetone Other (See Comments)    Hypertension  . Otezla [Apremilast]   . Potassium-Containing Compounds Other (See Comments)    GI upset  . Prednisone Nausea And Vomiting  . Sulfasalazine Hives and Other (See Comments)    GI upset  .  Sulfonamide Derivatives Hives  . Metronidazole Rash  . Sulfamethoxazole Rash    REVIEW OF SYSTEMS:   Review of Systems  Constitutional: Positive for malaise/fatigue. Negative for chills and fever.  HENT: Negative for congestion, sinus pain and sore throat.   Eyes: Negative for blurred vision and double vision.  Respiratory: Negative for cough and shortness of breath.   Cardiovascular: Negative for chest pain and palpitations.  Gastrointestinal: Positive for abdominal pain, diarrhea (copious), nausea and vomiting. Negative for blood in  stool, constipation and melena.  Genitourinary: Negative for dysuria, flank pain, hematuria and urgency.  Musculoskeletal: Positive for myalgias. Negative for falls.  Neurological: Positive for weakness. Negative for dizziness and loss of consciousness.  Psychiatric/Behavioral: Negative.  Negative for depression.    MEDICATIONS AT HOME:   Prior to Admission medications   Medication Sig Start Date End Date Taking? Authorizing Provider  albuterol (PROVENTIL HFA;VENTOLIN HFA) 108 (90 Base) MCG/ACT inhaler Inhale 1-2 puffs into the lungs every 6 (six) hours as needed for wheezing or shortness of breath. 06/08/18  Yes [name], MD  ALPRAZolam Duanne Moron) 0.5 MG tablet Take 0.5 mg by mouth at bedtime.    Yes [provider]  aspirin EC 81 MG tablet Take 81 mg by mouth daily.   Yes [provider]  Biotin 1000 MCG tablet Take 1,000 mcg by mouth 3 (three) times daily.   Yes [provider]  budesonide (ENTOCORT EC) 3 MG 24 hr capsule TAKE 1 CAPSULE(3 MG) BY MOUTH DAILY 09/30/17  Yes Milus Banister, MD  carboxymethylcellulose (REFRESH PLUS) 0.5 % SOLN Place 1 drop into both eyes 2 (two) times daily.    Yes [provider]  CARTIA XT 180 MG 24 hr capsule TAKE 1 CAPSULE(180 MG) BY MOUTH DAILY 02/07/16  Yes Gollan, Kathlene November, MD  chlorpheniramine-HYDROcodone (TUSSIONEX PENNKINETIC ER) 10-8 MG/5ML SUER Take 5 mLs by mouth at  bedtime as needed for cough. 02/07/19  Yes [name], MD  diltiazem (DILACOR XR) 180 MG 24 hr capsule Take 180 mg by mouth daily.   Yes [provider]  EPIPEN 2-PAK 0.3 MG/0.3ML SOAJ injection Inject 0.3 mg into the muscle once.    Yes [provider]  glucose blood test strip 1 each by Other route as needed for other. Use as instructed   Yes [provider]  hydrochlorothiazide (HYDRODIURIL) 12.5 MG tablet Take 12.5 mg by mouth daily. 03/15/18  Yes [provider]  Ibuprofen-diphenhydrAMINE Cit (ADVIL PM) 200-38 MG TABS Take 3 tablets by mouth at bedtime.   Yes [provider]  Melatonin 5 MG TABS Take 5 mg by mouth at bedtime.   Yes [provider]  memantine (NAMENDA) 5 MG tablet Take 5 mg by mouth 2 (two) times daily. 03/13/19 03/12/20 Yes [provider]  mesalamine (LIALDA) 1.2 g EC tablet Take 4.8 g by mouth daily with breakfast. 04/04/19 05/04/19 Yes [provider]  metoprolol tartrate (LOPRESSOR) 50 MG tablet TAKE 1 TABLET(50 MG) BY MOUTH TWICE DAILY 11/24/18  Yes [name], MD  Omalizumab Arvid Right) 150 MG/ML SOSY Inject into the skin.   Yes [provider]  ondansetron (ZOFRAN-ODT) 4 MG disintegrating tablet Take 4 mg by mouth every 8 (eight) hours as needed for nausea or vomiting.   Yes [provider]  potassium chloride (K-DUR) 10 MEQ tablet Take 10 mEq by mouth 2 (two) times daily. 03/30/19 03/29/20 Yes [provider]  prazosin (MINIPRESS) 1 MG capsule Take 1 mg by mouth at bedtime. 03/11/19 03/10/20 Yes [provider]  RABEprazole (ACIPHEX) 20 MG tablet Take 20 mg by mouth daily. 7mnutes prior to a meal. 03/25/19  Yes [provider]  rosuvastatin (CRESTOR) 40 MG tablet Take 1 tablet (40 mg total) by mouth daily. 11/11/16 04/06/19 Yes Gollan, TKathlene November MD  sertraline (ZOLOFT) 100 MG tablet Take 150 mg by mouth daily.    Yes [provider]  sucralfate (CARAFATE)  1 g tablet Take 1 g by mouth  2 (two) times daily. 03/25/19  Yes [provider]  vitamin B-12 (CYANOCOBALAMIN) 1000 MCG tablet Take 1,000 mcg by mouth daily.   Yes [provider]  Vitamin D, Ergocalciferol, (DRISDOL) 50000 units CAPS capsule Take 50,000 Units by mouth every Saturday.  10/08/16  Yes [provider]  vitamin E 600 UNIT capsule Take 600 Units by mouth 2 (two) times daily.   Yes [provider]  diclofenac (VOLTAREN) 75 MG EC tablet Take 75 mg by mouth 2 (two) times daily.    [provider]  hyoscyamine (LEVSIN) 0.125 MG tablet Take 0.125 mg by mouth every 4 (four) hours as needed.    [provider]      VITAL SIGNS:  Blood pressure (!) 149/76, pulse 83, temperature 98.8 F (37.1 C), temperature source Oral, resp. rate 16, height _0  (1.727 m), weight 109.6 kg, SpO2 93 %.  PHYSICAL EXAMINATION:  Physical Exam  GENERAL:  73 y.o.-year-old patient lying in the bed with no acute distress.  Weak appearing EYES: Pupils equal, round, reactive to light and accommodation. No scleral icterus. Extraocular muscles intact.  HEENT: Head atraumatic, normocephalic. Oropharynx and nasopharynx clear.  NECK:  Supple, no jugular venous distention. No thyroid enlargement, no tenderness.  LUNGS: Normal breath sounds bilaterally, no wheezing, rales,rhonchi or crepitation. No use of accessory muscles of respiration.  CARDIOVASCULAR: Regular rate and rhythm, S1, S2 normal. No murmurs, rubs, or gallops.  ABDOMEN: Obese diffusely tender, nondistended,.  Hyperactive bowel sounds present. No organomegaly or mass.  EXTREMITIES: No pedal edema, cyanosis, or clubbing.  NEUROLOGIC: Cranial nerves II through XII are intact. Muscle strength 5/5 in all extremities. Sensation intact. Gait not checked.  PSYCHIATRIC: The patient is alert and oriented x 3.  Normal affect and good eye contact. SKIN: No obvious rash, lesion, or ulcer.   LABORATORY PANEL:   CBC  Recent Labs  Lab 04/07/19 0359  WBC 9.6  HGB 12.0  HCT 36.1  PLT 164   ------------------------------------------------------------------------------------------------------------------  Chemistries  Recent Labs  Lab 04/06/19 1848 04/07/19 0359  NA 135 137  K 3.6 3.0*  CL 99 103  CO2 25 24  GLUCOSE 145* 109*  BUN 12 13  CREATININE 0.79 0.63  CALCIUM 9.4 9.3  AST 95*  --   ALT 63*  --   ALKPHOS 50  --   BILITOT 1.1  --    ------------------------------------------------------------------------------------------------------------------  Cardiac Enzymes No results for input(s): TROPONINI in the last 168 hours. ------------------------------------------------------------------------------------------------------------------  RADIOLOGY:  Ct Abdomen Pelvis Wo Contrast  Result Date: 04/06/2019 CLINICAL DATA:  Diarrhea EXAM: CT ABDOMEN AND PELVIS WITHOUT CONTRAST TECHNIQUE: Multidetector CT imaging of the abdomen and pelvis was performed following the standard protocol without IV contrast. COMPARISON:  CT 02/28/2010 FINDINGS: Lower chest: Lung bases demonstrate no acute consolidation or effusion. The heart size is normal. Hepatobiliary: No focal liver abnormality is seen. Status post cholecystectomy. No biliary dilatation. Pancreas: Unremarkable. No pancreatic ductal dilatation or surrounding inflammatory changes. Spleen: Borderline enlarged Adrenals/Urinary Tract: Adrenal glands are unremarkable. Kidneys are normal, without renal calculi, focal lesion, or hydronephrosis. Bladder is unremarkable. Stomach/Bowel: The stomach is within normal limits. No dilated small bowel. Liquid stools in the colon. Scattered sigmoid colon diverticula. No acute colon wall thickening. Vascular/Lymphatic: Moderate aortic atherosclerosis. No aneurysm. Scattered mesenteric and right lower quadrant lymph nodes. Reproductive: Status post hysterectomy. No adnexal masses. Other: Negative for free air or free  fluid Musculoskeletal: Degenerative changes. No acute or suspicious abnormality. IMPRESSION: 1. Diffuse liquid stools  in the colon consistent with diarrheal illness. No colon wall thickening. 2. Scattered sigmoid colon diverticula without acute inflammatory change. Electronically Signed   By: Donavan Foil M.D.   On: 04/06/2019 22:06   Dg Chest Port 1 View  Result Date: 04/06/2019 CLINICAL DATA:  Diarrhea and vomiting for several days EXAM: PORTABLE CHEST 1 VIEW COMPARISON:  10/07/2018 FINDINGS: The heart size and mediastinal contours are within normal limits. Both lungs are clear. The visualized skeletal structures are unremarkable. IMPRESSION: No active disease. Electronically Signed   By: Inez Catalina M.D.   On: 04/06/2019 18:10      IMPRESSION AND PLAN:   1.  Dehydration - Secondary to intractable diarrhea with nausea and vomiting - Patient is receiving IV fluid replacement with normal saline at 100 cc/h -We will repeat CBC and BMP in the a.m.  2.  Intractable diarrhea - Patient received Imodium initially in the emergency room - We will continue Imodium per prescribed guidelines for management of diarrhea. -Patient is receiving IV fluid replacement - Will institute bowel rest given evidence of diffuse enteritis on CT abdomen.  3.  Abdominal pain -We will treat with analgesic - We will treat nausea vomiting with IV antiemetic  4.  Generalized weakness - Supportive care -We will consult physical therapy if needed with prolonged illness  DVT and PPI prophylaxis initiated    All the records are reviewed and case discussed with ED provider. The plan of care was discussed in details with the patient (and family). I answered all questions. The patient agreed to proceed with the above mentioned plan. Further management will depend upon hospital course.   CODE STATUS: Full code  TOTAL TIME TAKING CARE OF THIS PATIENT: 45 minutes.    Theo Dills Mili Piltz CRNPon 04/07/2019 at 4:39 AM   Pager - [phone]  After 6pm go to [email or web address] - Proofreader  [facility]  Office  [phone]  CC: Primary care physician; [place] Pink, MD   Note: This dictation was prepared with Dragon dictation along with smaller phrase technology. Any transcriptional errors that result from this process are unintentional.

## 2019-04-07 NOTE — ED Notes (Signed)
ED TO INPATIENT HANDOFF REPORT  ED Nurse Name and Phone #:  Anson Crofts Name/Age/Gender Caitlin Leonard 73 y.o. female Room/Bed: ED17A/ED17A  Code Status   Code Status: Full Code  Home/SNF/Other Home Patient oriented to: self, place, time and situation Is this baseline? Yes   Triage Complete: Triage complete  Chief Complaint Weakness/N/V  Triage Note C/o diarrhea X 48 hours, emesis X 36hours. HR 120-130 a fib with EMS. PIV started by EMS and 150cc NS given. Pt was sitting on her walker today when she felt too weak to get up, causing her to fall in the floor. Pt alert and oriented X4, active, cooperative, pt in NAD. RR even and unlabored, color WNL.     Allergies Allergies  Allergen Reactions  . Contrast Media [Iodinated Diagnostic Agents] Anaphylaxis  . Gadolinium Derivatives Anaphylaxis  . Gadoversetamide Anaphylaxis  . Shellfish Allergy Nausea And Vomiting and Other (See Comments)    Can eat shrimp but not oysters Dizziness,severe nausea and vomiting ( can eat shrimp CAN NOT EAT ORYSTERS) Other reaction(s): Other (See Comments) Dizziness,severe nausea and vomiting ( can eat shrimp CAN NOT EAT ORYSTERS)  Can eat shrimp but not oysters Dizziness,severe nausea and vomiting ( can eat shrimp CAN NOT EAT ORYSTERS)  Can eat shrimp but not oysters   . Sulfa Antibiotics Hives and Other (See Comments)    GI upset GI upset GI upset  GI upset GI upset GI upset Other reaction(s): Other (See Comments) GI upset GI upset  GI upset GI upset   . Hydroxychloroquine Sulfate Hives and Other (See Comments)    Severe hives, all over like chicken pox.  . Banana Nausea And Vomiting and Other (See Comments)    Raw bananas  . Limonene Other (See Comments)    GI upset GI upset   . Morphine And Related   . Nabumetone Other (See Comments)    Hypertension  . Otezla [Apremilast]   . Potassium-Containing Compounds Other (See Comments)    GI upset  . Prednisone Nausea And Vomiting   . Sulfasalazine Hives and Other (See Comments)    GI upset  . Sulfonamide Derivatives Hives  . Metronidazole Rash  . Sulfamethoxazole Rash    Level of Care/Admitting Diagnosis ED Disposition    ED Disposition Condition [name]: [place] [100120]  Level of Care: Med-Surg [16]  Covid Evaluation: Screening Protocol (No Symptoms)  Diagnosis: Dehydration [276.51.ICD-9-CM]  Admitting Physician: Mayer Camel [[id number]]  Attending Physician: Mayer Camel [[id number]]  Estimated length of stay: past midnight tomorrow  Certification:: I certify this patient will need inpatient services for at least 2 midnights  PT Class (Do Not Modify): Inpatient [101]  PT Acc Code (Do Not Modify): Private [1]       B Medical/Surgery History Past Medical History:  Diagnosis Date  . Anemia   . Anxiety   . Arthritis    osteoarthritis  . Asthma    due to seasonal alllergies  . Atrial fibrillation ([place])   . Carpal tunnel syndrome   . Cataract   . Cervicalgia   . Collagenous colitis 2010  . COPD (chronic obstructive pulmonary disease) ([place])   . Depression    situational  . Diabetes mellitus without complication ([place])   . Dysrhythmia   . GERD (gastroesophageal reflux disease)   . GI bleed   . Headache(784.0)    migraines; 2 x month  . Heart murmur    MVP (  symptomatic)  . History of IBS   . Hyperlipemia   . Hypertension    on medication x 10 years  . Migraine   . MVP (mitral valve prolapse)   . Shortness of breath    exertional  . Sinoatrial node dysfunction ([facility])   . Sleep apnea 2007   does not use CPAP since 40 # wt loss  . Stroke ([place])   . Syncopal episodes    Past Surgical History:  Procedure Laterality Date  . ABDOMINAL HYSTERECTOMY  1979  . BACK SURGERY    . BREAST CYST ASPIRATION Right 25 plus yrs ago  . CARDIAC CATHETERIZATION  2012   [facility]  . CHOLECYSTECTOMY  2011  . COLONOSCOPY WITH PROPOFOL N/A 03/25/2019   Procedure:  COLONOSCOPY WITH PROPOFOL;  Surgeon: Lollie Sails, MD;  Location: [facility] ENDOSCOPY;  Service: Endoscopy;  Laterality: N/A;  . ESOPHAGOGASTRODUODENOSCOPY (EGD) WITH PROPOFOL N/A 03/25/2019   Procedure: ESOPHAGOGASTRODUODENOSCOPY (EGD) WITH PROPOFOL;  Surgeon: Lollie Sails, MD;  Location: [facility] ENDOSCOPY;  Service: Endoscopy;  Laterality: N/A;  . FLEXIBLE BRONCHOSCOPY N/A 10/10/2016   Procedure: FLEXIBLE BRONCHOSCOPY;  Surgeon: [name], MD;  Location: [facility] ORS;  Service: Pulmonary;  Laterality: N/A;  . FLEXIBLE BRONCHOSCOPY N/A 12/11/2017   Procedure: FLEXIBLE BRONCHOSCOPY;  Surgeon: [name], MD;  Location: [facility] ORS;  Service: Pulmonary;  Laterality: N/A;  . [place], 2012 x 2   bilateral  . LUMBAR LAMINECTOMY  [id number],6433  . NASAL SINUS SURGERY  1994  . TONSILLECTOMY    . TOTAL KNEE ARTHROPLASTY  12/22/2011   Procedure: TOTAL KNEE ARTHROPLASTY;  Surgeon: Rudean Haskell, MD;  Location: [place];  Service: Orthopedics;  Laterality: Left;     A IV Location/Drains/Wounds Patient Lines/Drains/Airways Status   Active Line/Drains/Airways    Name:   Placement date:   Placement time:   Site:   Days:   Peripheral IV 03/25/19 Right Wrist   03/25/19    0836    Wrist   13   Peripheral IV 04/06/19 Left Antecubital   04/06/19    1722    Antecubital   1   Closed System Drain 1 Left Knee 10 Fr.   12/22/11    0850    Knee   2663   Incision 12/22/11 Leg Left   12/22/11    0817     2663          Intake/Output Last 24 hours No intake or output data in the 24 hours ending 04/07/19 0125  Labs/Imaging Results for orders placed or performed during the hospital encounter of 04/06/19 (from the past 48 hour(s))  Comprehensive metabolic panel     Status: Abnormal   Collection Time: 04/06/19  6:48 PM  Result Value Ref Range   Sodium 135 135 - 145 mmol/L   Potassium 3.6 3.5 - 5.1 mmol/L   Chloride 99 98 - 111 mmol/L   CO2 25 22 - 32 mmol/L    Glucose, Bld 145 (H) 70 - 99 mg/dL   BUN 12 8 - 23 mg/dL   Creatinine, Ser 0.79 0.44 - 1.00 mg/dL   Calcium 9.4 8.9 - 10.3 mg/dL   Total Protein 6.9 6.5 - 8.1 g/dL   Albumin 3.6 3.5 - 5.0 g/dL   AST 95 (H) 15 - 41 U/L   ALT 63 (H) 0 - 44 U/L   Alkaline Phosphatase 50 38 - 126 U/L   Total Bilirubin  1.1 0.3 - 1.2 mg/dL   GFR calc non Af Amer >60 >60 mL/min   GFR calc Af Amer >60 >60 mL/min   Anion gap 11 5 - 15    Comment: Performed at [facility], [address] [place], [place] [address]  CBC with Differential     Status: Abnormal   Collection Time: 04/06/19  6:48 PM  Result Value Ref Range   WBC 12.2 (H) 4.0 - 10.5 K/uL   RBC 4.35 3.87 - 5.11 MIL/uL   Hemoglobin 13.6 12.0 - 15.0 g/dL   HCT 42.1 36.0 - 46.0 %   MCV 96.8 80.0 - 100.0 fL   MCH 31.3 26.0 - 34.0 pg   MCHC 32.3 30.0 - 36.0 g/dL   RDW 13.2 11.5 - 15.5 %   Platelets 215 150 - 400 K/uL   nRBC 0.0 0.0 - 0.2 %   Neutrophils Relative % 94 %   Neutro Abs 11.5 (H) 1.7 - 7.7 K/uL   Lymphocytes Relative 2 %   Lymphs Abs 0.2 (L) 0.7 - 4.0 K/uL   Monocytes Relative 4 %   Monocytes Absolute 0.4 0.1 - 1.0 K/uL   Eosinophils Relative 0 %   Eosinophils Absolute 0.0 0.0 - 0.5 K/uL   Basophils Relative 0 %   Basophils Absolute 0.0 0.0 - 0.1 K/uL   Immature Granulocytes 0 %   Abs Immature Granulocytes 0.05 0.00 - 0.07 K/uL    Comment: Performed at [facility], [address] [place], [place] [address]  Gastrointestinal Panel by PCR , Stool     Status: None   Collection Time: 04/06/19  6:48 PM   Specimen: Stool  Result Value Ref Range   Campylobacter species NOT DETECTED NOT DETECTED   Plesimonas shigelloides NOT DETECTED NOT DETECTED   Salmonella species NOT DETECTED NOT DETECTED   Yersinia enterocolitica NOT DETECTED NOT DETECTED   Vibrio species NOT DETECTED NOT DETECTED   Vibrio cholerae NOT DETECTED NOT DETECTED   Enteroaggregative E coli (EAEC) NOT DETECTED NOT DETECTED   Enteropathogenic E  coli (EPEC) NOT DETECTED NOT DETECTED   Enterotoxigenic E coli (ETEC) NOT DETECTED NOT DETECTED   Shiga like toxin producing E coli (STEC) NOT DETECTED NOT DETECTED   Shigella/Enteroinvasive E coli (EIEC) NOT DETECTED NOT DETECTED   Cryptosporidium NOT DETECTED NOT DETECTED   Cyclospora cayetanensis NOT DETECTED NOT DETECTED   Entamoeba histolytica NOT DETECTED NOT DETECTED   Giardia lamblia NOT DETECTED NOT DETECTED   Adenovirus F40/41 NOT DETECTED NOT DETECTED   Astrovirus NOT DETECTED NOT DETECTED   Norovirus GI/GII NOT DETECTED NOT DETECTED   Rotavirus A NOT DETECTED NOT DETECTED   Sapovirus (I, II, IV, and V) NOT DETECTED NOT DETECTED    Comment: Performed at [facility], [address] [place], [place] [address]  C Difficile Quick Screen w PCR reflex     Status: None   Collection Time: 04/06/19  6:48 PM   Specimen: Stool  Result Value Ref Range   C Diff antigen NEGATIVE NEGATIVE   C Diff toxin NEGATIVE NEGATIVE   C Diff interpretation No C. difficile detected.     Comment: Performed at [facility], [address] [place], [place] [address]  Brain natriuretic peptide     Status: None   Collection Time: 04/06/19  6:48 PM  Result Value Ref Range   B Natriuretic Peptide 29.0 0.0 - 100.0 pg/mL    Comment: Performed at [facility], [address] [place], [place] [address]  Troponin I (High Sensitivity)     Status: None   Collection Time: 04/06/19  6:48 PM  Result Value Ref Range   Troponin I (High Sensitivity) 10 <18 ng/L    Comment: (NOTE) Elevated high sensitivity troponin I (hsTnI) values and significant  changes across serial measurements may suggest ACS but many other  chronic and acute conditions are known to elevate hsTnI results.  Refer to the "Links" section for chest pain algorithms and additional  guidance. Performed at [facility], [address] [place], [place] [address]   SARS Coronavirus 2 (CEPHEID- Performed in  [facility] hospital lab), Hosp Order     Status: None   Collection Time: 04/06/19  7:48 PM   Specimen: Nasopharyngeal Swab  Result Value Ref Range   SARS Coronavirus 2 NEGATIVE NEGATIVE    Comment: (NOTE) If result is NEGATIVE SARS-CoV-2 target nucleic acids are NOT DETECTED. The SARS-CoV-2 RNA is generally detectable in upper and lower  respiratory specimens during the acute phase of infection. The lowest  concentration of SARS-CoV-2 viral copies this assay can detect is 250  copies / mL. A negative result does not preclude SARS-CoV-2 infection  and should not be used as the sole basis for treatment or other  patient management decisions.  A negative result may occur with  improper specimen collection / handling, submission of specimen other  than nasopharyngeal swab, presence of viral mutation(s) within the  areas targeted by this assay, and inadequate number of viral copies  (<250 copies / mL). A negative result must be combined with clinical  observations, patient history, and epidemiological information. If result is POSITIVE SARS-CoV-2 target nucleic acids are DETECTED. The SARS-CoV-2 RNA is generally detectable in upper and lower  respiratory specimens dur ing the acute phase of infection.  Positive  results are indicative of active infection with SARS-CoV-2.  Clinical  correlation with patient history and other diagnostic information is  necessary to determine patient infection status.  Positive results do  not rule out bacterial infection or co-infection with other viruses. If result is PRESUMPTIVE POSTIVE SARS-CoV-2 nucleic acids MAY BE PRESENT.   A presumptive positive result was obtained on the submitted specimen  and confirmed on repeat testing.  While 2019 novel coronavirus  (SARS-CoV-2) nucleic acids may be present in the submitted sample  additional confirmatory testing may be necessary for epidemiological  and / or clinical management purposes  to differentiate  between  SARS-CoV-2 and other Sarbecovirus currently known to infect humans.  If clinically indicated additional testing with an alternate test  methodology [phone]) is advised. The SARS-CoV-2 RNA is generally  detectable in upper and lower respiratory sp ecimens during the acute  phase of infection. The expected result is Negative. Fact Sheet for Patients:  StrictlyIdeas.no Fact Sheet for Healthcare Providers: BankingDealers.co.za This test is not yet approved or cleared by the [place] FDA and has been authorized for detection and/or diagnosis of SARS-CoV-2 by FDA under an Emergency Use Authorization (EUA).  This EUA will remain in effect (meaning this test can be used) for the duration of the COVID-19 declaration under Section 564(b)(1) of the Act, 21 U.S.C. section 360bbb-3(b)(1), unless the authorization is terminated or revoked sooner. Performed at [facility], [place], [place] [address]    Ct Abdomen Pelvis Wo Contrast  Result Date: 04/06/2019 CLINICAL DATA:  Diarrhea EXAM: CT ABDOMEN AND PELVIS WITHOUT CONTRAST TECHNIQUE: Multidetector CT imaging of the abdomen and pelvis  was performed following the standard protocol without IV contrast. COMPARISON:  CT 02/28/2010 FINDINGS: Lower chest: Lung bases demonstrate no acute consolidation or effusion. The heart size is normal. Hepatobiliary: No focal liver abnormality is seen. Status post cholecystectomy. No biliary dilatation. Pancreas: Unremarkable. No pancreatic ductal dilatation or surrounding inflammatory changes. Spleen: Borderline enlarged Adrenals/Urinary Tract: Adrenal glands are unremarkable. Kidneys are normal, without renal calculi, focal lesion, or hydronephrosis. Bladder is unremarkable. Stomach/Bowel: The stomach is within normal limits. No dilated small bowel. Liquid stools in the colon. Scattered sigmoid colon diverticula. No acute colon wall  thickening. Vascular/Lymphatic: Moderate aortic atherosclerosis. No aneurysm. Scattered mesenteric and right lower quadrant lymph nodes. Reproductive: Status post hysterectomy. No adnexal masses. Other: Negative for free air or free fluid Musculoskeletal: Degenerative changes. No acute or suspicious abnormality. IMPRESSION: 1. Diffuse liquid stools in the colon consistent with diarrheal illness. No colon wall thickening. 2. Scattered sigmoid colon diverticula without acute inflammatory change. Electronically Signed   By: Donavan Foil M.D.   On: 04/06/2019 22:06   Dg Chest Port 1 View  Result Date: 04/06/2019 CLINICAL DATA:  Diarrhea and vomiting for several days EXAM: PORTABLE CHEST 1 VIEW COMPARISON:  10/07/2018 FINDINGS: The heart size and mediastinal contours are within normal limits. Both lungs are clear. The visualized skeletal structures are unremarkable. IMPRESSION: No active disease. Electronically Signed   By: Inez Catalina M.D.   On: 04/06/2019 18:10    Pending Labs Unresulted Labs (From admission, onward)    Start     Ordered   04/14/19 0500  Creatinine, serum  (enoxaparin (LOVENOX)    CrCl >/= 30 ml/min)  Weekly,   STAT    Comments: while on enoxaparin therapy    04/07/19 0101   04/07/19 3664  Basic metabolic panel  Tomorrow morning,   STAT     04/07/19 0101   04/07/19 0500  CBC  Tomorrow morning,   STAT     04/07/19 0101   04/07/19 0100  TSH  Once,   STAT     04/07/19 0101   04/07/19 0100  Urinalysis, Complete w Microscopic  Once,   STAT     04/07/19 0101   04/07/19 0059  CBC  (enoxaparin (LOVENOX)    CrCl >/= 30 ml/min)  Once,   STAT    Comments: Baseline for enoxaparin therapy IF NOT ALREADY DRAWN.  Notify MD if PLT < 100 K.    04/07/19 0101   04/07/19 0059  Creatinine, serum  (enoxaparin (LOVENOX)    CrCl >/= 30 ml/min)  Once,   STAT    Comments: Baseline for enoxaparin therapy IF NOT ALREADY DRAWN.    04/07/19 0101   04/06/19 1747  Urinalysis, Complete w Microscopic   ONCE - STAT,   STAT     04/06/19 1747          Vitals/Pain Today's Vitals   04/06/19 2100 04/06/19 2230 04/06/19 2300 04/06/19 2315  BP: (!) 166/80     Pulse: (!) 104 (!) 108 (!) 101 95  Resp: (!) 26 (!) _0 Temp:      TempSrc:      SpO2: 94% 97% 94% 94%  Weight:      Height:      PainSc:        Isolation Precautions Airborne and Contact precautions  Medications Medications  metoprolol tartrate (LOPRESSOR) tablet 50 mg (50 mg Oral Given 04/06/19 2228)  enoxaparin (LOVENOX) injection 40 mg (has no administration in time range)  sodium chloride flush (NS) 0.9 % injection 3 mL (has no administration in time range)  0.9 %  sodium chloride infusion (has no administration in time range)  ondansetron (ZOFRAN) tablet 4 mg (has no administration in time range)    Or  ondansetron (ZOFRAN) injection 4 mg (has no administration in time range)  sodium chloride 0.9 % bolus 500 mL (500 mLs Intravenous Bolus 04/06/19 1755)  prochlorperazine (COMPAZINE) injection 10 mg (10 mg Intravenous Given 04/06/19 2136)  sodium chloride 0.9 % bolus 500 mL (500 mLs Intravenous New Bag/Given 04/06/19 2137)  loperamide (IMODIUM) capsule 2 mg (2 mg Oral Given 04/06/19 2228)    Mobility walks with device High fall risk   Focused Assessments Cardiac Assessment Handoff:  Cardiac Rhythm: Atrial fibrillation Lab Results  Component Value Date   TROPONINI <0.03 06/04/2016   No results found for: DDIMER Does the Patient currently have chest pain? No     R Recommendations: See Admitting Provider Note  Report given to:   Additional Notes:

## 2019-04-07 NOTE — Progress Notes (Signed)
[facility] [place] NAME: Talullah Abate    MR#:  [id number]  DATE OF BIRTH:  June 13, 1946  SUBJECTIVE:  CHIEF COMPLAINT:   Chief Complaint  Patient presents with  . Diarrhea  . Emesis   No new complaints this morning.  Nausea vomiting and diarrhea significantly improved.  No fevers.  No abdominal pains.  REVIEW OF SYSTEMS:  Review of Systems  Constitutional: Negative for chills and fever.  HENT: Negative for hearing loss and tinnitus.   Eyes: Negative for blurred vision and double vision.  Respiratory: Negative for cough and shortness of breath.   Cardiovascular: Negative for chest pain and palpitations.  Gastrointestinal: Negative for abdominal pain.       Nausea vomiting and diarrhea significantly improved.  Genitourinary: Negative for dysuria and urgency.  Musculoskeletal: Negative for myalgias and neck pain.  Skin: Negative for itching and rash.  Neurological: Negative for dizziness and headaches.  Psychiatric/Behavioral: Negative for depression and hallucinations.    DRUG ALLERGIES:   Allergies  Allergen Reactions  . Contrast Media [Iodinated Diagnostic Agents] Anaphylaxis  . Gadolinium Derivatives Anaphylaxis  . Gadoversetamide Anaphylaxis  . Shellfish Allergy Nausea And Vomiting and Other (See Comments)    Can eat shrimp but not oysters Dizziness,severe nausea and vomiting ( can eat shrimp CAN NOT EAT ORYSTERS) Other reaction(s): Other (See Comments) Dizziness,severe nausea and vomiting ( can eat shrimp CAN NOT EAT ORYSTERS)  Can eat shrimp but not oysters Dizziness,severe nausea and vomiting ( can eat shrimp CAN NOT EAT ORYSTERS)  Can eat shrimp but not oysters   . Sulfa Antibiotics Hives and Other (See Comments)    GI upset GI upset GI upset  GI upset GI upset GI upset Other reaction(s): Other (See Comments) GI upset GI upset  GI upset GI upset   . Hydroxychloroquine Sulfate Hives and Other (See  Comments)    Severe hives, all over like chicken pox.  . Banana Nausea And Vomiting and Other (See Comments)    Raw bananas  . Limonene Other (See Comments)    GI upset GI upset   . Morphine And Related   . Nabumetone Other (See Comments)    Hypertension  . Otezla [Apremilast]   . Potassium-Containing Compounds Other (See Comments)    GI upset  . Prednisone Nausea And Vomiting  . Sulfasalazine Hives and Other (See Comments)    GI upset  . Sulfonamide Derivatives Hives  . Metronidazole Rash  . Sulfamethoxazole Rash   VITALS:  Blood pressure (!) 149/76, pulse 83, temperature 98.8 F (37.1 C), temperature source Oral, resp. rate 16, height 5\' 8"  (1.727 m), weight 109.6 kg, SpO2 93 %. PHYSICAL EXAMINATION:  Physical Exam  Constitutional: She is oriented to person, place, and time. She appears well-developed and well-nourished.  HENT:  Head: Normocephalic and atraumatic.  Right Ear: External ear normal.  Eyes: Pupils are equal, round, and reactive to light. Conjunctivae are normal. Right eye exhibits no discharge.  Neck: Normal range of motion. Neck supple. No thyromegaly present.  Cardiovascular: Normal rate, regular rhythm and normal heart sounds.  Respiratory: Effort normal. No respiratory distress.  GI: Soft. Bowel sounds are normal. She exhibits no distension.  Musculoskeletal: Normal range of motion.        General: No edema.  Neurological: She is alert and oriented to person, place, and time. No cranial nerve deficit.  Skin: Skin is warm. She is not diaphoretic. No erythema.  Psychiatric: She has a  normal mood and affect. Her behavior is normal.    LABORATORY PANEL:  Female CBC Recent Labs  Lab 04/07/19 0359  WBC 9.6  HGB 12.0  HCT 36.1  PLT 164   ------------------------------------------------------------------------------------------------------------------ Chemistries  Recent Labs  Lab 04/06/19 1848 04/07/19 0359  NA 135 137  K 3.6 3.0*  CL 99 103   CO2 25 24  GLUCOSE 145* 109*  BUN 12 13  CREATININE 0.79 0.63  CALCIUM 9.4 9.3  AST 95*  --   ALT 63*  --   ALKPHOS 50  --   BILITOT 1.1  --    RADIOLOGY:  Ct Abdomen Pelvis Wo Contrast  Result Date: 04/06/2019 CLINICAL DATA:  Diarrhea EXAM: CT ABDOMEN AND PELVIS WITHOUT CONTRAST TECHNIQUE: Multidetector CT imaging of the abdomen and pelvis was performed following the standard protocol without IV contrast. COMPARISON:  CT 02/28/2010 FINDINGS: Lower chest: Lung bases demonstrate no acute consolidation or effusion. The heart size is normal. Hepatobiliary: No focal liver abnormality is seen. Status post cholecystectomy. No biliary dilatation. Pancreas: Unremarkable. No pancreatic ductal dilatation or surrounding inflammatory changes. Spleen: Borderline enlarged Adrenals/Urinary Tract: Adrenal glands are unremarkable. Kidneys are normal, without renal calculi, focal lesion, or hydronephrosis. Bladder is unremarkable. Stomach/Bowel: The stomach is within normal limits. No dilated small bowel. Liquid stools in the colon. Scattered sigmoid colon diverticula. No acute colon wall thickening. Vascular/Lymphatic: Moderate aortic atherosclerosis. No aneurysm. Scattered mesenteric and right lower quadrant lymph nodes. Reproductive: Status post hysterectomy. No adnexal masses. Other: Negative for free air or free fluid Musculoskeletal: Degenerative changes. No acute or suspicious abnormality. IMPRESSION: 1. Diffuse liquid stools in the colon consistent with diarrheal illness. No colon wall thickening. 2. Scattered sigmoid colon diverticula without acute inflammatory change. Electronically Signed   By: Donavan Foil M.D.   On: 04/06/2019 22:06   Dg Chest Port 1 View  Result Date: 04/06/2019 CLINICAL DATA:  Diarrhea and vomiting for several days EXAM: PORTABLE CHEST 1 VIEW COMPARISON:  10/07/2018 FINDINGS: The heart size and mediastinal contours are within normal limits. Both lungs are clear. The visualized skeletal  structures are unremarkable. IMPRESSION: No active disease. Electronically Signed   By: Inez Catalina M.D.   On: 04/06/2019 18:10   ASSESSMENT AND PLAN:    1.  Dehydration - Secondary to intractable diarrhea with nausea and vomiting.  Symptoms significantly improved. Being hydrated with IV fluids.  2.  Intractable diarrhea C. difficile negative.  GI panel negative.  Diarrhea already significantly improved.  CT abdomen and pelvis without contrast with findings consistent with diarrheal illness. Placed on PRN Imodium.  IV fluid hydration.  3.  Abdominal pain -We will treat with analgesic Significantly improved.  No acute findings on CT scan of the abdomen  4.  Generalized weakness - Supportive care Physical therapy consult placed.  GI prophylaxis ; Lovenox  All the records are reviewed and case discussed with Care Management/Social Worker. Management plans discussed with the patient, family and they are in agreement.  CODE STATUS: Full Code  TOTAL TIME TAKING CARE OF THIS PATIENT: 36 minutes.   More than 50% of the time was spent in counseling/coordination of care: YES  POSSIBLE D/C IN 2 DAYS, DEPENDING ON CLINICAL CONDITION.   Ailanie Ruttan M.D on 04/07/2019 at 12:46 PM  Between 7am to 6pm - Pager - [phone]  After 6pm go to [email or web address] - Proofreader  [facility]  Office  [phone]  CC: Primary care physician; [place] Pink, MD  Note: This  dictation was prepared with Dragon dictation along with smaller phrase technology. Any transcriptional errors that result from this process are unintentional.

## 2019-04-07 NOTE — Evaluation (Signed)
Physical Therapy Evaluation Patient Details Name: Caitlin Leonard MRN: [id number] DOB: 11/30/1945 Today's Date: 04/07/2019   History of Present Illness  Pt admitted for dehydration with complaints of diarrhea and vomiting. History includes anemia, COPD, depression, and GERD.   Clinical Impression  Pt is a pleasant 73 year old female who was admitted for dehydration and multiple falls. She reports 7 falls in last 6 months. Pt performs bed mobility with cga, transfers with min a, and ambulation with cga and RW. Fatigues with increased distance. Pt demonstrates deficits with balance/mobility/endurance. Would benefit from skilled PT to address above deficits and promote optimal return to PLOF. Recommend transition to [place] upon discharge from acute hospitalization.     Follow Up Recommendations Home health PT    Equipment Recommendations  None recommended by PT    Recommendations for Other Services       Precautions / Restrictions Precautions Precautions: Fall Restrictions Weight Bearing Restrictions: No      Mobility  Bed Mobility Overal bed mobility: Needs Assistance Bed Mobility: Supine to Sit     Supine to sit: Min guard     General bed mobility comments: safe technique. Cues to not hold breath during transition  Transfers Overall transfer level: Needs assistance Equipment used: Rolling walker (2 wheeled) Transfers: Sit to/from Stand Sit to Stand: Min assist         General transfer comment: cues to push from seated surface. RW used  Ambulation/Gait Ambulation/Gait assistance: Counsellor (Feet): 50 Feet Assistive device: Rolling walker (2 wheeled) Gait Pattern/deviations: Step-through pattern     General Gait Details: short stride. Cues to keep close to RW. Fatigues with increased distance  Stairs            Wheelchair Mobility    Modified Rankin (Stroke Patients Only)       Balance Overall balance assessment: Needs assistance;History  of Falls Sitting-balance support: Feet supported;Bilateral upper extremity supported Sitting balance-Leahy Scale: Good     Standing balance support: Bilateral upper extremity supported Standing balance-Leahy Scale: Fair                               Pertinent Vitals/Pain Pain Assessment: No/denies pain    Home Living Family/patient expects to be discharged to:: Private residence Living Arrangements: Spouse/significant other Available Help at Discharge: Family;Available 24 hours/day Type of Home: Independent living facility Home Access: Level entry     Home Layout: One level Home Equipment: Walker - 2 wheels;Cane - single point;Wheelchair - manual      Prior Function Level of Independence: Needs assistance   Gait / Transfers Assistance Needed: previously was ambulatory with SPC, however recently has been having increased falls. Using RW and WC for primary mobility.           Hand Dominance        Extremity/Trunk Assessment   Upper Extremity Assessment Upper Extremity Assessment: Generalized weakness(B UE grossly 4/5)    Lower Extremity Assessment Lower Extremity Assessment: Generalized weakness(B LE grossly 4/5)       Communication   Communication: No difficulties  Cognition Arousal/Alertness: Awake/alert Behavior During Therapy: WFL for tasks assessed/performed Overall Cognitive Status: Within Functional Limits for tasks assessed                                        General Comments  Exercises Other Exercises Other Exercises: supine ther-ex including bridging, B LE SLRs, and hip abd/add. All ther-ex performed x 10 reps with cga Other Exercises: ambulated to bathroom with cga. min assist for transfer and hygiene   Assessment/Plan    PT Assessment Patient needs continued PT services  PT Problem List Decreased strength;Decreased activity tolerance;Decreased balance;Decreased mobility       PT Treatment  Interventions Gait training;Therapeutic exercise;Balance training    PT Goals (Current goals can be found in the Care Plan section)  Acute Rehab PT Goals Patient Stated Goal: to go back to [facility] PT Goal Formulation: With patient Time For Goal Achievement: 04/21/19 Potential to Achieve Goals: Good    Frequency Min 2X/week   Barriers to discharge        Co-evaluation               AM-PAC PT "6 Clicks" Mobility  Outcome Measure Help needed turning from your back to your side while in a flat bed without using bedrails?: None Help needed moving from lying on your back to sitting on the side of a flat bed without using bedrails?: A Little Help needed moving to and from a bed to a chair (including a wheelchair)?: A Little Help needed standing up from a chair using your arms (e.g., wheelchair or bedside chair)?: A Little Help needed to walk in hospital room?: A Little Help needed climbing 3-5 steps with a railing? : A Lot 6 Click Score: 18    End of Session Equipment Utilized During Treatment: Gait belt Activity Tolerance: Patient tolerated treatment well Patient left: in bed;with bed alarm set Nurse Communication: Mobility status PT Visit Diagnosis: Muscle weakness (generalized) (M62.81);History of falling (Z91.81);Repeated falls (R29.6);Difficulty in walking, not elsewhere classified (R26.2)    Time: 1442-1510 PT Time Calculation (min) (ACUTE ONLY): 28 min   Charges:   PT Evaluation $PT Eval Low Complexity: 1 Low PT Treatments $Therapeutic Exercise: 8-22 mins        Greggory Stallion, PT, DPT [phone]   Roann Merk 04/07/2019, 5:15 PM

## 2019-04-08 DIAGNOSIS — K52831 Collagenous colitis: Secondary | ICD-10-CM

## 2019-04-08 LAB — CBC
HCT: 34.3 % — ABNORMAL LOW (ref 36.0–46.0)
Hemoglobin: 11.3 g/dL — ABNORMAL LOW (ref 12.0–15.0)
MCH: 31.7 pg (ref 26.0–34.0)
MCHC: 32.9 g/dL (ref 30.0–36.0)
MCV: 96.3 fL (ref 80.0–100.0)
Platelets: 154 10*3/uL (ref 150–400)
RBC: 3.56 MIL/uL — ABNORMAL LOW (ref 3.87–5.11)
RDW: 13.6 % (ref 11.5–15.5)
WBC: 5.3 10*3/uL (ref 4.0–10.5)
nRBC: 0 % (ref 0.0–0.2)

## 2019-04-08 LAB — BASIC METABOLIC PANEL
Anion gap: 6 (ref 5–15)
BUN: 9 mg/dL (ref 8–23)
CO2: 24 mmol/L (ref 22–32)
Calcium: 8.7 mg/dL — ABNORMAL LOW (ref 8.9–10.3)
Chloride: 110 mmol/L (ref 98–111)
Creatinine, Ser: 0.5 mg/dL (ref 0.44–1.00)
GFR calc Af Amer: >60 mL/min (ref >60)
GFR calc non Af Amer: >60 mL/min (ref >60)
Glucose, Bld: 115 mg/dL — ABNORMAL HIGH (ref 70–99)
Potassium: 3.3 mmol/L — ABNORMAL LOW (ref 3.5–5.1)
Sodium: 140 mmol/L (ref 135–145)

## 2019-04-08 LAB — URINALYSIS, COMPLETE (UACMP) WITH MICROSCOPIC
Bilirubin Urine: NEGATIVE
Glucose, UA: NEGATIVE mg/dL
Hgb urine dipstick: NEGATIVE
Ketones, ur: NEGATIVE mg/dL
Nitrite: NEGATIVE
Protein, ur: NEGATIVE mg/dL
Specific Gravity, Urine: 1.012 (ref 1.005–1.030)
pH: 6 (ref 5.0–8.0)

## 2019-04-08 LAB — PHOSPHORUS: Phosphorus: 2.4 mg/dL — ABNORMAL LOW (ref 2.5–4.6)

## 2019-04-08 LAB — MAGNESIUM: Magnesium: 1.7 mg/dL (ref 1.7–2.4)

## 2019-04-08 MED ORDER — POTASSIUM CHLORIDE CRYS ER 20 MEQ PO TBCR
40.0000 meq | EXTENDED_RELEASE_TABLET | Freq: Once | ORAL | Status: AC
  Administered 2019-04-08: 40 meq via ORAL
  Filled 2019-04-08: qty 2

## 2019-04-08 MED ORDER — ALPRAZOLAM 0.5 MG PO TABS
0.5000 mg | ORAL_TABLET | Freq: Every evening | ORAL | Status: DC | PRN
  Administered 2019-04-08 – 2019-04-09 (×2): 0.5 mg via ORAL
  Filled 2019-04-08 (×2): qty 1

## 2019-04-08 MED ORDER — LOPERAMIDE HCL 2 MG PO CAPS
2.0000 mg | ORAL_CAPSULE | Freq: Four times a day (QID) | ORAL | Status: DC
  Administered 2019-04-08 – 2019-04-10 (×8): 2 mg via ORAL
  Filled 2019-04-08 (×8): qty 1

## 2019-04-08 MED ORDER — BUDESONIDE 3 MG PO CPEP
9.0000 mg | ORAL_CAPSULE | Freq: Every day | ORAL | Status: DC
  Administered 2019-04-08 – 2019-04-10 (×3): 9 mg via ORAL
  Filled 2019-04-08 (×3): qty 3

## 2019-04-08 NOTE — TOC Initial Note (Addendum)
Transition of Care [facility]) - Initial/Assessment Note    Patient Details  Name: Caitlin Leonard MRN: [id number] Date of Birth: 03-31-1946  [facility]) CM/SW Contact:    Katrina Stack, RN Phone Number: 04/08/2019, 5:16 PM  Clinical Narrative:                  Physical therapy has recommended home health.  Patient resides in independent living in the [facility].  Through many calls, found that patient is currently receiving physical therapy on [facility].  She has a rollator. Per [facility]- will not require any new physical therapy orders  Expected Discharge Plan: OP [place]) Barriers to Discharge: No Barriers Identified   Patient Goals and CMS Choice Patient states their goals for this hospitalization and ongoing recovery are:: go home   Choice offered to / list presented to : NA  Expected Discharge Plan and Services Expected Discharge Plan: OP [place])   Discharge Planning Services: CM Consult Post Acute Care Choice: NA Living arrangements for the past 2 months: Single Family Home                           HH Arranged: (Spoke with Delrae Alfred at [facility]. They have current order for physical therapy for this patient)          Prior Living Arrangements/Services Living arrangements for the past 2 months: [place] with:: Spouse Patient language and need for interpreter reviewed:: Yes Do you feel safe going back to the place where you live?: Yes      Need for Family Participation in Patient Care: No (Comment) Care giver support system in place?: Yes (comment) Current home services: DME, Home PT(Rollator and Physical therapy by [facility] Therapy Dept) Criminal Activity/Legal Involvement Pertinent to Current Situation/Hospitalization: No - Comment as needed  Activities of Daily Living Home Assistive Devices/Equipment: Gilford Rile (specify type) ADL Screening (condition  at time of admission) Patient's cognitive ability adequate to safely complete daily activities?: Yes Is the patient deaf or have difficulty hearing?: No Does the patient have difficulty seeing, even when wearing glasses/contacts?: No Does the patient have difficulty concentrating, remembering, or making decisions?: No Patient able to express need for assistance with ADLs?: Yes Does the patient have difficulty dressing or bathing?: No Independently performs ADLs?: Yes (appropriate for developmental age) Does the patient have difficulty walking or climbing stairs?: Yes Weakness of Legs: Both Weakness of Arms/Hands: None  Permission Sought/Granted Permission sought to share information with : Chartered certified accountant granted to share information with : Yes, Verbal Permission Granted     Permission granted to share info w AGENCY: [facility]        Emotional Assessment Appearance:: Appears stated age Attitude/Demeanor/Rapport: Engaged Affect (typically observed): Calm Orientation: : Oriented to Self, Oriented to Place, Oriented to  Time, Oriented to Situation Alcohol / Substance Use: Not Applicable Psych Involvement: No (comment)  Admission diagnosis:  Dehydration [E86.0] SOB (shortness of breath) [R06.02] Diarrhea, unspecified type [R19.7] Patient Active Problem List   Diagnosis Date Noted  . Collagenous colitis   . Dehydration 04/07/2019  . Leg swelling 03/19/2016  . Essential hypertension 03/19/2016  . Obesity 09/10/2015  . Bronchitis, acute 04/11/2015  . OSA (obstructive sleep apnea) 10/18/2014  . Nodule of right lung 06/14/2014  . Dyspnea 06/13/2014  . Coronary artery calcification 04/17/2014  . Aneurysm, ascending aorta (  [place]) 04/17/2014  . Chronic cough 02/24/2014  . Mold suspected exposure 02/24/2014   PCP:  [place] Pink, MD Pharmacy:   [facility] [phone], [place] [address] Phone: [phone] Fax: [phone]     Social Determinants of Health (SDOH) Interventions    Readmission Risk Interventions No flowsheet data found.

## 2019-04-08 NOTE — Progress Notes (Signed)
Pt requesting xanax that she takes at night.  hasnt had in 2 nights and feels tearful.  Notified provider. Order received.

## 2019-04-08 NOTE — Consult Note (Addendum)
Lucilla Lame, MD [facility]  [address], [place], [place] [address] Phone: [phone] Fax : [phone]  Consultation  Referring Provider:     Dr. Stark Jock Primary Care Physician:  Maryland Pink, MD Primary Gastroenterologist:  Dr. Gustavo Lah         Reason for Consultation:     Urgent consult called for chronic lymphocytic colitis  Date of Admission:  04/06/2019 Date of Consultation:  04/08/2019         HPI:   Caitlin Leonard is a 73 y.o. female had an urgent consult called by her hospitalist due to diarrhea.  The patient has had diarrhea for months and a work-up with Dr. Gustavo Lah including a colonoscopy that showed collagenous colitis.  The patient states that despite trying multiple things including Lomotil and Imodium she continues to have diarrhea.  He reports that she was on budesonide in the past but she believes it made her sick. With this patient's diarrhea her creatinine on admission was normal and has remained normal.  The patient's white cell count on admission was 12.2 that is down to 5.3 today.  Her hemoglobin on admission was 12 which is down to 11.3 today.  The patient is very frustrated with the level of care she has received from her gastroenterologist about her diarrhea.  Past Medical History:  Diagnosis Date  . Anemia   . Anxiety   . Arthritis    osteoarthritis  . Asthma    due to seasonal alllergies  . Atrial fibrillation ([place])   . Carpal tunnel syndrome   . Cataract   . Cervicalgia   . Collagenous colitis 2010  . COPD (chronic obstructive pulmonary disease) ([place])   . Depression    situational  . Diabetes mellitus without complication ([place])   . Dysrhythmia   . GERD (gastroesophageal reflux disease)   . GI bleed   . Headache(784.0)    migraines; 2 x month  . Heart murmur    MVP ( symptomatic)  . History of IBS   . Hyperlipemia   . Hypertension    on medication x 10 years  . Migraine   . MVP (mitral valve prolapse)   . Shortness of breath    exertional  . Sinoatrial node dysfunction ([facility])   . Sleep apnea 2007   does not use CPAP since 40 # wt loss  . Stroke ([place])   . Syncopal episodes     Past Surgical History:  Procedure Laterality Date  . ABDOMINAL HYSTERECTOMY  1979  . BACK SURGERY    . BREAST CYST ASPIRATION Right 25 plus yrs ago  . CARDIAC CATHETERIZATION  2012   [facility]  . CHOLECYSTECTOMY  2011  . COLONOSCOPY WITH PROPOFOL N/A 03/25/2019   Procedure: COLONOSCOPY WITH PROPOFOL;  Surgeon: Lollie Sails, MD;  Location: [facility] ENDOSCOPY;  Service: Endoscopy;  Laterality: N/A;  . ESOPHAGOGASTRODUODENOSCOPY (EGD) WITH PROPOFOL N/A 03/25/2019   Procedure: ESOPHAGOGASTRODUODENOSCOPY (EGD) WITH PROPOFOL;  Surgeon: Lollie Sails, MD;  Location: [facility] ENDOSCOPY;  Service: Endoscopy;  Laterality: N/A;  . FLEXIBLE BRONCHOSCOPY N/A 10/10/2016   Procedure: FLEXIBLE BRONCHOSCOPY;  Surgeon: [name], MD;  Location: [facility] ORS;  Service: Pulmonary;  Laterality: N/A;  . FLEXIBLE BRONCHOSCOPY N/A 12/11/2017   Procedure: FLEXIBLE BRONCHOSCOPY;  Surgeon: [name], MD;  Location: [facility] ORS;  Service: Pulmonary;  Laterality: N/A;  . [place], 2012 x 2   bilateral  . LUMBAR LAMINECTOMY  [id number]  . NASAL SINUS SURGERY  1994  . TONSILLECTOMY    . TOTAL KNEE ARTHROPLASTY  12/22/2011   Procedure: TOTAL KNEE ARTHROPLASTY;  Surgeon: Rudean Haskell, MD;  Location: [place];  Service: Orthopedics;  Laterality: Left;    Prior to Admission medications   Medication Sig Start Date End Date Taking? Authorizing Provider  albuterol (PROVENTIL HFA;VENTOLIN HFA) 108 (90 Base) MCG/ACT inhaler Inhale 1-2 puffs into the lungs every 6 (six) hours as needed for wheezing or shortness of breath. 06/08/18  Yes [name], MD  ALPRAZolam Duanne Moron) 0.5 MG tablet Take 0.5 mg by mouth at bedtime.    Yes [provider]  aspirin EC 81 MG tablet Take 81 mg by mouth daily.   Yes [provider]   Biotin 1000 MCG tablet Take 1,000 mcg by mouth 3 (three) times daily.   Yes [provider]  budesonide (ENTOCORT EC) 3 MG 24 hr capsule TAKE 1 CAPSULE(3 MG) BY MOUTH DAILY 09/30/17  Yes Milus Banister, MD  carboxymethylcellulose (REFRESH PLUS) 0.5 % SOLN Place 1 drop into both eyes 2 (two) times daily.    Yes [provider]  CARTIA XT 180 MG 24 hr capsule TAKE 1 CAPSULE(180 MG) BY MOUTH DAILY 02/07/16  Yes Gollan, Kathlene November, MD  chlorpheniramine-HYDROcodone (TUSSIONEX PENNKINETIC ER) 10-8 MG/5ML SUER Take 5 mLs by mouth at bedtime as needed for cough. 02/07/19  Yes [name], MD  diltiazem (DILACOR XR) 180 MG 24 hr capsule Take 180 mg by mouth daily.   Yes [provider]  EPIPEN 2-PAK 0.3 MG/0.3ML SOAJ injection Inject 0.3 mg into the muscle once.    Yes [provider]  glucose blood test strip 1 each by Other route as needed for other. Use as instructed   Yes [provider]  hydrochlorothiazide (HYDRODIURIL) 12.5 MG tablet Take 12.5 mg by mouth daily. 03/15/18  Yes [provider]  Ibuprofen-diphenhydrAMINE Cit (ADVIL PM) 200-38 MG TABS Take 3 tablets by mouth at bedtime.   Yes [provider]  Melatonin 5 MG TABS Take 5 mg by mouth at bedtime.   Yes [provider]  memantine (NAMENDA) 5 MG tablet Take 5 mg by mouth 2 (two) times daily. 03/13/19 03/12/20 Yes [provider]  mesalamine (LIALDA) 1.2 g EC tablet Take 4.8 g by mouth daily with breakfast. 04/04/19 05/04/19 Yes [provider]  metoprolol tartrate (LOPRESSOR) 50 MG tablet TAKE 1 TABLET(50 MG) BY MOUTH TWICE DAILY 11/24/18  Yes [name], MD  Omalizumab Arvid Right) 150 MG/ML SOSY Inject into the skin.   Yes [provider]  ondansetron (ZOFRAN-ODT) 4 MG disintegrating tablet Take 4 mg by mouth every 8 (eight) hours as needed for nausea or vomiting.   Yes [provider]  potassium chloride (K-DUR) 10 MEQ tablet Take 10 mEq  by mouth 2 (two) times daily. 03/30/19 03/29/20 Yes [provider]  prazosin (MINIPRESS) 1 MG capsule Take 1 mg by mouth at bedtime. 03/11/19 03/10/20 Yes [provider]  RABEprazole (ACIPHEX) 20 MG tablet Take 20 mg by mouth daily. 48mnutes prior to a meal. 03/25/19  Yes [provider]  rosuvastatin (CRESTOR) 40 MG tablet Take 1 tablet (40 mg total) by mouth daily. 11/11/16 04/06/19 Yes Gollan, TKathlene November MD  sertraline (ZOLOFT) 100 MG tablet Take 150 mg by mouth daily.    Yes [provider]  sucralfate (CARAFATE) 1 g tablet Take 1 g by mouth 2 (two) times daily.  03/25/19  Yes [provider]  vitamin B-12 (CYANOCOBALAMIN) 1000 MCG tablet Take 1,000 mcg by mouth daily.   Yes [provider]  Vitamin D, Ergocalciferol, (DRISDOL) 50000 units CAPS capsule Take 50,000 Units by mouth every Saturday.  10/08/16  Yes [provider]  vitamin E 600 UNIT capsule Take 600 Units by mouth 2 (two) times daily.   Yes [provider]  diclofenac (VOLTAREN) 75 MG EC tablet Take 75 mg by mouth 2 (two) times daily.    [provider]  hyoscyamine (LEVSIN) 0.125 MG tablet Take 0.125 mg by mouth every 4 (four) hours as needed.    [provider]    Family History  Problem Relation Age of Onset  . Hypertension Mother   . Hyperlipidemia Mother   . Diabetes Mother   . Hypertension Father   . Hyperlipidemia Father   . Arrhythmia Father        A-Fib  . Diabetes Paternal Uncle   . Diabetes Paternal Grandfather   . Breast cancer Maternal Aunt        great in late 62's  . Anesthesia problems Neg Hx   . Colon cancer Neg Hx   . Stomach cancer Neg Hx   . Rectal cancer Neg Hx   . Liver cancer Neg Hx   . Esophageal cancer Neg Hx      Social History   Tobacco Use  . Smoking status: Never Smoker  . Smokeless tobacco: Never Used  Substance Use Topics  . Alcohol use: Yes    Alcohol/week: 1.0 standard drinks    Types: 1 Glasses of  wine per week    Comment: occasional wine - less than once a month  . Drug use: Never    Allergies as of 04/06/2019 - Review Complete 04/06/2019  Allergen Reaction Noted  . Contrast media [iodinated diagnostic agents] Anaphylaxis 12/05/2011  . Gadolinium derivatives Anaphylaxis 04/06/2014  . Gadoversetamide Anaphylaxis 04/06/2014  . Shellfish allergy Nausea And Vomiting and Other (See Comments) 05/11/2015  . Sulfa antibiotics Hives and Other (See Comments)   . Hydroxychloroquine sulfate Hives and Other (See Comments) 07/04/2010  . Banana Nausea And Vomiting and Other (See Comments) 05/11/2015  . Limonene Other (See Comments) 12/08/2017  . Morphine and related  03/24/2019  . Nabumetone Other (See Comments)   . Otezla [apremilast]  03/24/2019  . Potassium-containing compounds Other (See Comments) 12/08/2017  . Prednisone Nausea And Vomiting 12/15/2017  . Sulfasalazine Hives and Other (See Comments) 07/04/2010  . Sulfonamide derivatives Hives 07/04/2010  . Metronidazole Rash 07/04/2010  . Sulfamethoxazole Rash 03/24/2019    Review of Systems:    All systems reviewed and negative except where noted in HPI.   Physical Exam:  Vital signs in last 24 hours: Temp:  [97.4 F (36.3 C)-97.9 F (36.6 C)] 97.6 F (36.4 C) (07/03 1229) Pulse Rate:  [65-81] 65 (07/03 1229) Resp:  [20] 20 (07/03 0430) BP: (119-163)/(53-78) 140/66 (07/03 1229) SpO2:  [96 %-97 %] 97 % (07/03 1229) Last BM Date: 04/07/19 General:   Pleasant, cooperative in NAD Head:  Normocephalic and atraumatic. Eyes:   No icterus.   Conjunctiva pink. PERRLA. Ears:  Normal auditory acuity. Neck:  Supple; no masses or thyroidomegaly Lungs: Respirations even and unlabored. Lungs clear to auscultation bilaterally.   No wheezes, crackles, or rhonchi.  Heart:  Regular rate and rhythm;  Without murmur, clicks, rubs or gallops Abdomen:  Soft, nondistended, nontender. Normal bowel sounds. No appreciable masses or hepatomegaly.  No rebound or guarding.  Rectal:  Not performed. Msk:  Symmetrical without gross deformities.    Extremities:  Without edema, cyanosis or clubbing. Neurologic:  Alert and oriented x3;  grossly normal neurologically. Skin:  Intact without significant lesions or rashes. Cervical Nodes:  No significant cervical adenopathy. Psych:  Alert and cooperative. Normal affect.  LAB RESULTS: Recent Labs    04/06/19 1848 04/07/19 0359 04/08/19 0418  WBC 12.2* 9.6 5.3  HGB 13.6 12.0 11.3*  HCT 42.1 36.1 34.3*  PLT 215 164 154   BMET Recent Labs    04/06/19 1848 04/07/19 0359 04/08/19 0418  NA 135 137 140  K 3.6 3.0* 3.3*  CL 99 103 110  CO2 _0 GLUCOSE 145* 109* 115*  BUN _1 CREATININE 0.79 0.63 0.50  CALCIUM 9.4 9.3 8.7*   LFT Recent Labs    04/06/19 1848  PROT 6.9  ALBUMIN 3.6  AST 95*  ALT 63*  ALKPHOS 50  BILITOT 1.1   PT/INR No results for input(s): LABPROT, INR in the last 72 hours.  STUDIES: Ct Abdomen Pelvis Wo Contrast  Result Date: 04/06/2019 CLINICAL DATA:  Diarrhea EXAM: CT ABDOMEN AND PELVIS WITHOUT CONTRAST TECHNIQUE: Multidetector CT imaging of the abdomen and pelvis was performed following the standard protocol without IV contrast. COMPARISON:  CT 02/28/2010 FINDINGS: Lower chest: Lung bases demonstrate no acute consolidation or effusion. The heart size is normal. Hepatobiliary: No focal liver abnormality is seen. Status post cholecystectomy. No biliary dilatation. Pancreas: Unremarkable. No pancreatic ductal dilatation or surrounding inflammatory changes. Spleen: Borderline enlarged Adrenals/Urinary Tract: Adrenal glands are unremarkable. Kidneys are normal, without renal calculi, focal lesion, or hydronephrosis. Bladder is unremarkable. Stomach/Bowel: The stomach is within normal limits. No dilated small bowel. Liquid stools in the colon. Scattered sigmoid colon diverticula. No acute colon wall thickening. Vascular/Lymphatic: Moderate aortic  atherosclerosis. No aneurysm. Scattered mesenteric and right lower quadrant lymph nodes. Reproductive: Status post hysterectomy. No adnexal masses. Other: Negative for free air or free fluid Musculoskeletal: Degenerative changes. No acute or suspicious abnormality. IMPRESSION: 1. Diffuse liquid stools in the colon consistent with diarrheal illness. No colon wall thickening. 2. Scattered sigmoid colon diverticula without acute inflammatory change. Electronically Signed   By: Donavan Foil M.D.   On: 04/06/2019 22:06   Dg Chest Port 1 View  Result Date: 04/06/2019 CLINICAL DATA:  Diarrhea and vomiting for several days EXAM: PORTABLE CHEST 1 VIEW COMPARISON:  10/07/2018 FINDINGS: The heart size and mediastinal contours are within normal limits. Both lungs are clear. The visualized skeletal structures are unremarkable. IMPRESSION: No active disease. Electronically Signed   By: Inez Catalina M.D.   On: 04/06/2019 18:10      Impression / Plan:   Assessment: Active Problems:   Dehydration Collagenous colitis  DAJANEE VOORHEIS is a 73 y.o. y/o female with admission for diarrhea and vomiting  The patient has a diagnosis of collagenous colitis and reports that she had some nausea and vomiting on budesonide.  Plan:  The patient has been told that the mainstay of treatment for her collagenous colitis is antidiarrheal medication and budesonide.  The patient states that she is willing to retry the budesonide.  The patient will be started on 9 mg/day of the budesonide and she will have regularly scheduled Imodium.  This is to replace her Imodium as needed.  The patient has also been told that if these 2 do not work we will add Lomotil.  The patient has been  explained the plan and agrees with it.  Thank you for involving me in the care of this patient.      LOS: 1 day   Lucilla Lame, MD  04/08/2019, 12:51 PM    Note: This dictation was prepared with Dragon dictation along with smaller phrase technology. Any  transcriptional errors that result from this process are unintentional.

## 2019-04-08 NOTE — Progress Notes (Signed)
[place] [facility] [place] NAME: Trysta Showman    MR#:  [id number]  DATE OF BIRTH:  11-16-1945  SUBJECTIVE:  CHIEF COMPLAINT:   Chief Complaint  Patient presents with  . Diarrhea  . Emesis   Patient reported having multiple diarrheas overnight.  No fevers.  REVIEW OF SYSTEMS:  Review of Systems  Constitutional: Negative for chills and fever.  HENT: Negative for hearing loss and tinnitus.   Eyes: Negative for blurred vision and double vision.  Respiratory: Negative for cough and shortness of breath.   Cardiovascular: Negative for chest pain and palpitations.  Gastrointestinal: Positive for diarrhea. Negative for abdominal pain.  Genitourinary: Negative for dysuria and urgency.  Musculoskeletal: Negative for myalgias and neck pain.  Skin: Negative for itching and rash.  Neurological: Negative for dizziness and headaches.  Psychiatric/Behavioral: Negative for depression and hallucinations.    DRUG ALLERGIES:   Allergies  Allergen Reactions  . Contrast Media [Iodinated Diagnostic Agents] Anaphylaxis  . Gadolinium Derivatives Anaphylaxis  . Gadoversetamide Anaphylaxis  . Shellfish Allergy Nausea And Vomiting and Other (See Comments)    Can eat shrimp but not oysters Dizziness,severe nausea and vomiting ( can eat shrimp CAN NOT EAT ORYSTERS) Other reaction(s): Other (See Comments) Dizziness,severe nausea and vomiting ( can eat shrimp CAN NOT EAT ORYSTERS)  Can eat shrimp but not oysters Dizziness,severe nausea and vomiting ( can eat shrimp CAN NOT EAT ORYSTERS)  Can eat shrimp but not oysters   . Sulfa Antibiotics Hives and Other (See Comments)    GI upset GI upset GI upset  GI upset GI upset GI upset Other reaction(s): Other (See Comments) GI upset GI upset  GI upset GI upset   . Hydroxychloroquine Sulfate Hives and Other (See Comments)    Severe hives, all over like chicken pox.  . Banana Nausea And Vomiting and Other  (See Comments)    Raw bananas  . Limonene Other (See Comments)    GI upset GI upset   . Morphine And Related   . Nabumetone Other (See Comments)    Hypertension  . Otezla [Apremilast]   . Potassium-Containing Compounds Other (See Comments)    GI upset  . Prednisone Nausea And Vomiting  . Sulfasalazine Hives and Other (See Comments)    GI upset  . Sulfonamide Derivatives Hives  . Metronidazole Rash  . Sulfamethoxazole Rash   VITALS:  Blood pressure 140/66, pulse 65, temperature 97.6 F (36.4 C), temperature source Oral, resp. rate 20, height 5\' 8"  (1.727 m), weight 109.6 kg, SpO2 97 %. PHYSICAL EXAMINATION:  Physical Exam  Constitutional: She is oriented to person, place, and time. She appears well-developed and well-nourished.  HENT:  Head: Normocephalic and atraumatic.  Right Ear: External ear normal.  Eyes: Pupils are equal, round, and reactive to light. Conjunctivae are normal. Right eye exhibits no discharge.  Neck: Normal range of motion. Neck supple. No thyromegaly present.  Cardiovascular: Normal rate, regular rhythm and normal heart sounds.  Respiratory: Effort normal. No respiratory distress.  GI: Soft. Bowel sounds are normal. She exhibits no distension.  Musculoskeletal: Normal range of motion.        General: No edema.  Neurological: She is alert and oriented to person, place, and time. No cranial nerve deficit.  Skin: Skin is warm. She is not diaphoretic. No erythema.  Psychiatric: She has a normal mood and affect. Her behavior is normal.    LABORATORY PANEL:  Female CBC Recent Labs  Lab  04/08/19 0418  WBC 5.3  HGB 11.3*  HCT 34.3*  PLT 154   ------------------------------------------------------------------------------------------------------------------ Chemistries  Recent Labs  Lab 04/06/19 1848  04/08/19 0418  NA 135   < > 140  K 3.6   < > 3.3*  CL 99   < > 110  CO2 25   < > 24  GLUCOSE 145*   < > 115*  BUN 12   < > 9  CREATININE 0.79   <  > 0.50  CALCIUM 9.4   < > 8.7*  MG  --   --  1.7  AST 95*  --   --   ALT 63*  --   --   ALKPHOS 50  --   --   BILITOT 1.1  --   --    < > = values in this interval not displayed.   RADIOLOGY:  No results found. ASSESSMENT AND PLAN:    1.  Dehydration - Secondary to intractable diarrhea with nausea and vomiting.  Symptoms significantly improved. Being hydrated with IV fluids.  2.  Intractable diarrhea C. difficile negative.  GI panel negative.  Diarrhea already significantly improved.  CT abdomen and pelvis without contrast with findings consistent with diarrheal illness. No response to PRN Imodium.  Started on PRN Lomotil last night due to multiple loose stools. Patient reported being diagnosed with collagenous colitis by her gastroenterologist within the last 1 week. Seen by gastroenterologist this morning.  Patient started on budesonide 9 mg a day.  Placed on scheduled Imodium .  Monitor  3.  Abdominal pain Significantly improved.  No acute findings on CT scan of the abdomen  4.  Generalized weakness - Supportive care Physical therapy consult placed.  5.  Hypokalemia; replaced.  Follow-up on repeat levels in a.m.  GI prophylaxis ; Lovenox  All the records are reviewed and case discussed with Care Management/Social Worker. Management plans discussed with the patient, family and they are in agreement.  CODE STATUS: Full Code  TOTAL TIME TAKING CARE OF THIS PATIENT: 35 minutes.   More than 50% of the time was spent in counseling/coordination of care: YES  POSSIBLE D/C IN 2 DAYS, DEPENDING ON CLINICAL CONDITION.   Junie Avilla M.D on 04/08/2019 at 2:25 PM  Between 7am to 6pm - Pager - [phone]  After 6pm go to [email or web address] - Proofreader  [facility]  Office  [phone]  CC: Primary care physician; Maryland Pink, MD  Note: This dictation was prepared with Dragon dictation along with smaller phrase technology. Any  transcriptional errors that result from this process are unintentional.

## 2019-04-09 LAB — BASIC METABOLIC PANEL
Anion gap: 4 — ABNORMAL LOW (ref 5–15)
BUN: 6 mg/dL — ABNORMAL LOW (ref 8–23)
CO2: 24 mmol/L (ref 22–32)
Calcium: 8.8 mg/dL — ABNORMAL LOW (ref 8.9–10.3)
Chloride: 110 mmol/L (ref 98–111)
Creatinine, Ser: 0.43 mg/dL — ABNORMAL LOW (ref 0.44–1.00)
GFR calc Af Amer: >60 mL/min (ref >60)
GFR calc non Af Amer: >60 mL/min (ref >60)
Glucose, Bld: 118 mg/dL — ABNORMAL HIGH (ref 70–99)
Potassium: 4.2 mmol/L (ref 3.5–5.1)
Sodium: 138 mmol/L (ref 135–145)

## 2019-04-09 LAB — GLUCOSE, CAPILLARY
Glucose-Capillary: 100 mg/dL — ABNORMAL HIGH (ref 70–99)
Glucose-Capillary: 92 mg/dL (ref 70–99)
Glucose-Capillary: 124 mg/dL — ABNORMAL HIGH (ref 70–99)
Glucose-Capillary: 115 mg/dL — ABNORMAL HIGH (ref 70–99)

## 2019-04-09 LAB — MAGNESIUM: Magnesium: 1.8 mg/dL (ref 1.7–2.4)

## 2019-04-09 MED ORDER — INSULIN ASPART 100 UNIT/ML ~~LOC~~ SOLN
0.0000 [IU] | Freq: Three times a day (TID) | SUBCUTANEOUS | Status: DC
  Administered 2019-04-10: 12:00:00 1 [IU] via SUBCUTANEOUS
  Filled 2019-04-09: qty 1

## 2019-04-09 MED ORDER — INSULIN ASPART 100 UNIT/ML ~~LOC~~ SOLN: 0.0000 [IU] | Freq: Every day | SUBCUTANEOUS | Status: DC

## 2019-04-09 NOTE — Progress Notes (Signed)
Caitlin Lame, MD [facility]   [address], [place], [place] [address] Phone: [phone] Fax : [phone]   Subjective: The patient reports that she went from 13 bowel movements a day to 5.  The patient states she feels somewhat better.  The patient did not have any side effects from her budesonide which she had nausea vomiting previously.  The patient was also put on a regular dose and scheduled Imodium rather than as needed.   Objective: Vital signs in last 24 hours: Vitals:   04/08/19 1229 04/08/19 1958 04/09/19 0509 04/09/19 0929  BP: 140/66 139/74 (!) 128/54 119/70  Pulse: 65 79 66 68  Resp:  20 18   Temp: 97.6 F (36.4 C) 98.4 F (36.9 C) 98.1 F (36.7 C)   TempSrc: Oral Oral Oral   SpO2: 97% 96% 94%   Weight:      Height:       Weight change:   Intake/Output Summary (Last 24 hours) at 04/09/2019 1107 Last data filed at 04/09/2019 0935 Gross per 24 hour  Intake 4121.36 ml  Output 700 ml  Net 3421.36 ml     Exam: Heart:: Regular rate and rhythm, S1S2 present or without murmur or extra heart sounds Lungs: normal and clear to auscultation and percussion Abdomen: soft, nontender, normal bowel sounds   Lab Results: @LABTEST2 @ Micro Results: Recent Results (from the past 240 hour(s))  Gastrointestinal Panel by PCR , Stool     Status: None   Collection Time: 04/06/19  6:48 PM   Specimen: Stool  Result Value Ref Range Status   Campylobacter species NOT DETECTED NOT DETECTED Final   Plesimonas shigelloides NOT DETECTED NOT DETECTED Final   Salmonella species NOT DETECTED NOT DETECTED Final   Yersinia enterocolitica NOT DETECTED NOT DETECTED Final   Vibrio species NOT DETECTED NOT DETECTED Final   Vibrio cholerae NOT DETECTED NOT DETECTED Final   Enteroaggregative E coli (EAEC) NOT DETECTED NOT DETECTED Final   Enteropathogenic E coli (EPEC) NOT DETECTED NOT DETECTED Final   Enterotoxigenic E coli (ETEC) NOT DETECTED NOT DETECTED Final   Shiga like toxin  producing E coli (STEC) NOT DETECTED NOT DETECTED Final   Shigella/Enteroinvasive E coli (EIEC) NOT DETECTED NOT DETECTED Final   Cryptosporidium NOT DETECTED NOT DETECTED Final   Cyclospora cayetanensis NOT DETECTED NOT DETECTED Final   Entamoeba histolytica NOT DETECTED NOT DETECTED Final   Giardia lamblia NOT DETECTED NOT DETECTED Final   Adenovirus F40/41 NOT DETECTED NOT DETECTED Final   Astrovirus NOT DETECTED NOT DETECTED Final   Norovirus GI/GII NOT DETECTED NOT DETECTED Final   Rotavirus A NOT DETECTED NOT DETECTED Final   Sapovirus (I, II, IV, and V) NOT DETECTED NOT DETECTED Final    Comment: Performed at [facility], [address] [place], [place] [address]  C Difficile Quick Screen w PCR reflex     Status: None   Collection Time: 04/06/19  6:48 PM   Specimen: Stool  Result Value Ref Range Status   C Diff antigen NEGATIVE NEGATIVE Final   C Diff toxin NEGATIVE NEGATIVE Final   C Diff interpretation No C. difficile detected.  Final    Comment: Performed at [facility], [address] [place], [place] [address]  SARS Coronavirus 2 (CEPHEID- Performed in [facility] hospital lab), Hosp Order     Status: None   Collection Time: 04/06/19  7:48 PM   Specimen: Nasopharyngeal Swab  Result Value Ref Range Status   SARS Coronavirus  2 NEGATIVE NEGATIVE Final    Comment: (NOTE) If result is NEGATIVE SARS-CoV-2 target nucleic acids are NOT DETECTED. The SARS-CoV-2 RNA is generally detectable in upper and lower  respiratory specimens during the acute phase of infection. The lowest  concentration of SARS-CoV-2 viral copies this assay can detect is 250  copies / mL. A negative result does not preclude SARS-CoV-2 infection  and should not be used as the sole basis for treatment or other  patient management decisions.  A negative result may occur with  improper specimen collection / handling, submission of specimen other  than nasopharyngeal swab, presence of  viral mutation(s) within the  areas targeted by this assay, and inadequate number of viral copies  (<250 copies / mL). A negative result must be combined with clinical  observations, patient history, and epidemiological information. If result is POSITIVE SARS-CoV-2 target nucleic acids are DETECTED. The SARS-CoV-2 RNA is generally detectable in upper and lower  respiratory specimens dur ing the acute phase of infection.  Positive  results are indicative of active infection with SARS-CoV-2.  Clinical  correlation with patient history and other diagnostic information is  necessary to determine patient infection status.  Positive results do  not rule out bacterial infection or co-infection with other viruses. If result is PRESUMPTIVE POSTIVE SARS-CoV-2 nucleic acids MAY BE PRESENT.   A presumptive positive result was obtained on the submitted specimen  and confirmed on repeat testing.  While 2019 novel coronavirus  (SARS-CoV-2) nucleic acids may be present in the submitted sample  additional confirmatory testing may be necessary for epidemiological  and / or clinical management purposes  to differentiate between  SARS-CoV-2 and other Sarbecovirus currently known to infect humans.  If clinically indicated additional testing with an alternate test  methodology [phone]) is advised. The SARS-CoV-2 RNA is generally  detectable in upper and lower respiratory sp ecimens during the acute  phase of infection. The expected result is Negative. Fact Sheet for Patients:  StrictlyIdeas.no Fact Sheet for Healthcare Providers: BankingDealers.co.za This test is not yet approved or cleared by the [place] FDA and has been authorized for detection and/or diagnosis of SARS-CoV-2 by FDA under an Emergency Use Authorization (EUA).  This EUA will remain in effect (meaning this test can be used) for the duration of the COVID-19 declaration under Section  564(b)(1) of the Act, 21 U.S.C. section 360bbb-3(b)(1), unless the authorization is terminated or revoked sooner. Performed at [facility], [address] [place], [place] [address]    Studies/Results: No results found. Medications: I have reviewed the patient's current medications. Scheduled Meds: . aspirin EC  81 mg Oral Daily  . budesonide  9 mg Oral Daily  . diltiazem  180 mg Oral Daily  . enoxaparin (LOVENOX) injection  40 mg Subcutaneous Q24H  . insulin aspart  0-5 Units Subcutaneous QHS  . insulin aspart  0-9 Units Subcutaneous TID WC  . loperamide  2 mg Oral QID  . Melatonin  5 mg Oral QHS  . memantine  5 mg Oral BID  . mesalamine  4.8 g Oral Q breakfast  . metoprolol tartrate  50 mg Oral BID  . potassium chloride  10 mEq Oral BID  . rosuvastatin  40 mg Oral Daily  . sertraline  150 mg Oral Daily  . sodium chloride flush  3 mL Intravenous Q12H  . sucralfate  1 g Oral BID   Continuous Infusions: . sodium chloride 100 mL/hr at 04/09/19 0934   PRN Meds:.ALPRAZolam, diphenoxylate-atropine,  ondansetron **OR** ondansetron (ZOFRAN) IV   Assessment: Active Problems:   Dehydration   Collagenous colitis    Plan: This patient has collagenous colitis which is a chronic disease that will wax and wane.  The patient may need to have her Imodium increased and may need to have Lomotil added to her medications if the budesonide and Imodium did not help her.  It is too early for the budesonide to be having an effect.  She may have improvement once the budesonide starts working.  I would continue the budesonide and increase the Imodium in a scheduled pattern as needed.  There is nothing further to add from a GI point of view.  The patient has been explained the plan and agrees with it.  I will sign off.  Please call if any further GI concerns or questions.  We would like to thank you for the opportunity to participate in the care of [place].     LOS: 2 days    Rain Wilhide 04/09/2019, 11:07 AM

## 2019-04-09 NOTE — Progress Notes (Signed)
[facility] [place] NAME: Caitlin Leonard    MR#:  [id number]  DATE OF BIRTH:  09-05-1946  SUBJECTIVE:  CHIEF COMPLAINT:   Chief Complaint  Patient presents with  . Diarrhea  . Emesis   Diarrhea improving since yesterday.  No abdominal pains.  No fevers.     REVIEW OF SYSTEMS:  Review of Systems  Constitutional: Negative for chills and fever.  HENT: Negative for hearing loss and tinnitus.   Eyes: Negative for blurred vision and double vision.  Respiratory: Negative for cough and shortness of breath.   Cardiovascular: Negative for chest pain and palpitations.  Gastrointestinal: Negative for abdominal pain and diarrhea.       Diarrhea improving  Genitourinary: Negative for dysuria and urgency.  Musculoskeletal: Negative for myalgias and neck pain.  Skin: Negative for itching and rash.  Neurological: Negative for dizziness and headaches.  Psychiatric/Behavioral: Negative for depression and hallucinations.    DRUG ALLERGIES:   Allergies  Allergen Reactions  . Contrast Media [Iodinated Diagnostic Agents] Anaphylaxis  . Gadolinium Derivatives Anaphylaxis  . Gadoversetamide Anaphylaxis  . Shellfish Allergy Nausea And Vomiting and Other (See Comments)    Can eat shrimp but not oysters Dizziness,severe nausea and vomiting ( can eat shrimp CAN NOT EAT ORYSTERS) Other reaction(s): Other (See Comments) Dizziness,severe nausea and vomiting ( can eat shrimp CAN NOT EAT ORYSTERS)  Can eat shrimp but not oysters Dizziness,severe nausea and vomiting ( can eat shrimp CAN NOT EAT ORYSTERS)  Can eat shrimp but not oysters   . Sulfa Antibiotics Hives and Other (See Comments)    GI upset GI upset GI upset  GI upset GI upset GI upset Other reaction(s): Other (See Comments) GI upset GI upset  GI upset GI upset   . Hydroxychloroquine Sulfate Hives and Other (See Comments)    Severe hives, all over like chicken pox.  . Banana Nausea  And Vomiting and Other (See Comments)    Raw bananas  . Limonene Other (See Comments)    GI upset GI upset   . Morphine And Related   . Nabumetone Other (See Comments)    Hypertension  . Otezla [Apremilast]   . Potassium-Containing Compounds Other (See Comments)    GI upset  . Prednisone Nausea And Vomiting  . Sulfasalazine Hives and Other (See Comments)    GI upset  . Sulfonamide Derivatives Hives  . Metronidazole Rash  . Sulfamethoxazole Rash   VITALS:  Blood pressure 119/70, pulse 68, temperature 98.1 F (36.7 C), temperature source Oral, resp. rate 18, height 5\' 8"  (1.727 m), weight 109.6 kg, SpO2 94 %. PHYSICAL EXAMINATION:  Physical Exam  Constitutional: She is oriented to person, place, and time. She appears well-developed and well-nourished.  HENT:  Head: Normocephalic and atraumatic.  Right Ear: External ear normal.  Eyes: Pupils are equal, round, and reactive to light. Conjunctivae are normal. Right eye exhibits no discharge.  Neck: Normal range of motion. Neck supple. No thyromegaly present.  Cardiovascular: Normal rate, regular rhythm and normal heart sounds.  Respiratory: Effort normal. No respiratory distress.  GI: Soft. Bowel sounds are normal. She exhibits no distension.  Musculoskeletal: Normal range of motion.        General: No edema.  Neurological: She is alert and oriented to person, place, and time. No cranial nerve deficit.  Skin: Skin is warm. She is not diaphoretic. No erythema.  Psychiatric: She has a normal mood and affect. Her behavior is normal.  LABORATORY PANEL:  Female CBC Recent Labs  Lab 04/08/19 0418  WBC 5.3  HGB 11.3*  HCT 34.3*  PLT 154   ------------------------------------------------------------------------------------------------------------------ Chemistries  Recent Labs  Lab 04/06/19 1848  04/09/19 0427  NA 135   < > 138  K 3.6   < > 4.2  CL 99   < > 110  CO2 25   < > 24  GLUCOSE 145*   < > 118*  BUN 12   < > 6*   CREATININE 0.79   < > 0.43*  CALCIUM 9.4   < > 8.8*  MG  --    < > 1.8  AST 95*  --   --   ALT 63*  --   --   ALKPHOS 50  --   --   BILITOT 1.1  --   --    < > = values in this interval not displayed.   RADIOLOGY:  No results found. ASSESSMENT AND PLAN:    1.  Dehydration - Secondary to intractable diarrhea with nausea and vomiting.  Symptoms significantly improved. Being hydrated with IV fluids.  2.  Intractable diarrhea; improving C. difficile negative.  GI panel negative.  Diarrhea already significantly improved.  CT abdomen and pelvis without contrast with findings consistent with diarrheal illness. Patient reported being diagnosed with collagenous colitis by her gastroenterologist within the last 1 week. Seen by gastroenterologist.  Patient started on budesonide 9 mg a day.  Placed on scheduled Imodium and patient appears to be responding with improvement in diarrhea. Monitor for 1 more day to ensure further improvement in diarrhea before discharge home..  Monitor  3.  Abdominal pain Resolved.  No acute findings on CT scan of the abdomen  4.  Generalized weakness - Supportive care Seen by physical therapist.  Recommended home health with PT on discharge  5.  Hypokalemia; replaced.    GI prophylaxis ; Lovenox  All the records are reviewed and case discussed with Care Management/Social Worker. Management plans discussed with the patient, family and they are in agreement.  CODE STATUS: Full Code  TOTAL TIME TAKING CARE OF THIS PATIENT: 27 minutes.   More than 50% of the time was spent in counseling/coordination of care: YES  POSSIBLE D/C IN 1 DAY, DEPENDING ON CLINICAL CONDITION.   Shayann Garbutt M.D on 04/09/2019 at 10:37 AM  Between 7am to 6pm - Pager - [phone]  After 6pm go to [email or web address] - Proofreader  [facility]  Office  [phone]  CC: Primary care physician; [place] Pink, MD  Note: This dictation was  prepared with Dragon dictation along with smaller phrase technology. Any transcriptional errors that result from this process are unintentional.

## 2019-04-10 LAB — BASIC METABOLIC PANEL
Anion gap: 4 — ABNORMAL LOW (ref 5–15)
BUN: 9 mg/dL (ref 8–23)
CO2: 20 mmol/L — ABNORMAL LOW (ref 22–32)
Calcium: 8.7 mg/dL — ABNORMAL LOW (ref 8.9–10.3)
Chloride: 114 mmol/L — ABNORMAL HIGH (ref 98–111)
Creatinine, Ser: 0.43 mg/dL — ABNORMAL LOW (ref 0.44–1.00)
GFR calc Af Amer: >60 mL/min (ref >60)
GFR calc non Af Amer: >60 mL/min (ref >60)
Glucose, Bld: 129 mg/dL — ABNORMAL HIGH (ref 70–99)
Potassium: 3.9 mmol/L (ref 3.5–5.1)
Sodium: 138 mmol/L (ref 135–145)

## 2019-04-10 LAB — GLUCOSE, CAPILLARY
Glucose-Capillary: 105 mg/dL — ABNORMAL HIGH (ref 70–99)
Glucose-Capillary: 128 mg/dL — ABNORMAL HIGH (ref 70–99)

## 2019-04-10 LAB — MAGNESIUM: Magnesium: 1.7 mg/dL (ref 1.7–2.4)

## 2019-04-10 MED ORDER — LOPERAMIDE HCL 2 MG PO CAPS: 2.0000 mg | ORAL_CAPSULE | Freq: Four times a day (QID) | ORAL | 0 refills | Status: DC

## 2019-04-10 MED ORDER — BUDESONIDE 3 MG PO CPEP: 9.0000 mg | ORAL_CAPSULE | Freq: Every day | ORAL | 0 refills | Status: DC

## 2019-04-10 NOTE — Progress Notes (Signed)
PT Cancellation Note  Patient Details Name: Caitlin Leonard MRN: [id number] DOB: April 04, 1946   Cancelled Treatment:    Reason Eval/Treat Not Completed: Other (comment)   Pt in bed dressed and ready for discharge.  Declined services stating she felt she was back at her baseline and was anxious to return home.   Chesley Noon 04/10/2019, 1:37 PM

## 2019-04-10 NOTE — Discharge Summary (Signed)
[facility] NAME: Caitlin Leonard    MR#:  [id number]  DATE OF BIRTH:  [birth year]  DATE OF ADMISSION:  04/06/2019   ADMITTING PHYSICIAN: Christel Mormon, MD  DATE OF DISCHARGE: 04/10/2019  PRIMARY CARE PHYSICIAN: Maryland Pink, MD   ADMISSION DIAGNOSIS:  Dehydration [E86.0] SOB (shortness of breath) [R06.02] Diarrhea, unspecified type [R19.7] DISCHARGE DIAGNOSIS:  Active Problems:   Dehydration   Collagenous colitis  SECONDARY DIAGNOSIS:   Past Medical History:  Diagnosis Date  . Anemia   . Anxiety   . Arthritis    osteoarthritis  . Asthma    due to seasonal alllergies  . Atrial fibrillation ([place])   . Carpal tunnel syndrome   . Cataract   . Cervicalgia   . Collagenous colitis 2010  . COPD (chronic obstructive pulmonary disease) ([place])   . Depression    situational  . Diabetes mellitus without complication ([place])   . Dysrhythmia   . GERD (gastroesophageal reflux disease)   . GI bleed   . Headache(784.0)    migraines; 2 x month  . Heart murmur    MVP ( symptomatic)  . History of IBS   . Hyperlipemia   . Hypertension    on medication x 10 years  . Migraine   . MVP (mitral valve prolapse)   . Shortness of breath    exertional  . Sinoatrial node dysfunction ([facility])   . Sleep apnea 2007   does not use CPAP since 40 # wt loss  . Stroke ([place])   . Syncopal episodes    HOSPITAL COURSE:  Chief complaint; nausea vomiting and diarrhea  History of presenting complaint; Caitlin Leonard  is a 73 y.o. female with a known history of recent diagnosis of collagenous colitis presented for 48 hours of intermittent nausea and vomiting and diarrhea.  She presented to the emergency room via EMS services for severe weakness unable to get up from her sitting walker.  C. difficile collected in the emergency room which was negative.  Patient received Imodium at that time to control fluid loss.  Patient was admitted to hospital for further  evaluation and management  Hospital course; 1.Dehydration -Secondary to intractable diarrhea with nausea and vomiting.  Symptoms completely resolved.  No more nausea vomiting or diarrhea.  Adequately hydrated with IV fluids prior to discharge  2.  Recent diagnosis of collagenous colitis 1 week prior to this admission.  Patient presented withintractable diarrhea; significantly improved.  No more diarrhea since yesterday.  C. difficile negative.  GI panel negative.  CT abdomen and pelvis without contrast with findings consistent with diarrheal illness.Patient reported being diagnosed with collagenous colitis by her gastroenterologist within the last 1 week. Seen by gastroenterologist Dr Allen Norris.  Patient started on budesonide 9 mg a day.  Placed on scheduled Imodium and patient appears to be responding very well.  Continue same regimen.  Patient to follow-up with her primary gastroenterologist as previously scheduled.    3. Abdominal pain Resolved.  No acute findings on CT scan of the abdomen  4. Generalized weakness -Supportive care Seen by physical therapist.  Recommended home health with PT on discharge.  Case manager to set up on discharge  5.  Hypokalemia; replaced.     DISCHARGE CONDITIONS:  Stable CONSULTS OBTAINED:   DRUG ALLERGIES:   Allergies  Allergen Reactions  . Contrast Media [Iodinated Diagnostic Agents] Anaphylaxis  . Gadolinium Derivatives Anaphylaxis  . Gadoversetamide Anaphylaxis  . Shellfish  Allergy Nausea And Vomiting and Other (See Comments)    Can eat shrimp but not oysters Dizziness,severe nausea and vomiting ( can eat shrimp CAN NOT EAT ORYSTERS) Other reaction(s): Other (See Comments) Dizziness,severe nausea and vomiting ( can eat shrimp CAN NOT EAT ORYSTERS)  Can eat shrimp but not oysters Dizziness,severe nausea and vomiting ( can eat shrimp CAN NOT EAT ORYSTERS)  Can eat shrimp but not oysters   . Sulfa Antibiotics Hives and Other (See  Comments)    GI upset GI upset GI upset  GI upset GI upset GI upset Other reaction(s): Other (See Comments) GI upset GI upset  GI upset GI upset   . Hydroxychloroquine Sulfate Hives and Other (See Comments)    Severe hives, all over like chicken pox.  . Banana Nausea And Vomiting and Other (See Comments)    Raw bananas  . Limonene Other (See Comments)    GI upset GI upset   . Morphine And Related   . Nabumetone Other (See Comments)    Hypertension  . Otezla [Apremilast]   . Potassium-Containing Compounds Other (See Comments)    GI upset  . Prednisone Nausea And Vomiting  . Sulfasalazine Hives and Other (See Comments)    GI upset  . Sulfonamide Derivatives Hives  . Metronidazole Rash  . Sulfamethoxazole Rash   DISCHARGE MEDICATIONS:   Allergies as of 04/10/2019      Reactions   Contrast Media [iodinated Diagnostic Agents] Anaphylaxis   Gadolinium Derivatives Anaphylaxis   Gadoversetamide Anaphylaxis   Shellfish Allergy Nausea And Vomiting, Other (See Comments)   Can eat shrimp but not oysters Dizziness,severe nausea and vomiting ( can eat shrimp CAN NOT EAT ORYSTERS) Other reaction(s): Other (See Comments) Dizziness,severe nausea and vomiting ( can eat shrimp CAN NOT EAT ORYSTERS) Can eat shrimp but not oysters Dizziness,severe nausea and vomiting ( can eat shrimp CAN NOT EAT ORYSTERS) Can eat shrimp but not oysters   Sulfa Antibiotics Hives, Other (See Comments)   GI upset GI upset GI upset GI upset GI upset GI upset Other reaction(s): Other (See Comments) GI upset GI upset GI upset GI upset   Hydroxychloroquine Sulfate Hives, Other (See Comments)   Severe hives, all over like chicken pox.   Banana Nausea And Vomiting, Other (See Comments)   Raw bananas   Limonene Other (See Comments)   GI upset GI upset   Morphine And Related    Nabumetone Other (See Comments)   Hypertension   Otezla [apremilast]    Potassium-containing Compounds Other (See  Comments)   GI upset   Prednisone Nausea And Vomiting   Sulfasalazine Hives, Other (See Comments)   GI upset   Sulfonamide Derivatives Hives   Metronidazole Rash   Sulfamethoxazole Rash      Medication List    STOP taking these medications   Cartia XT 180 MG 24 hr capsule Generic drug: diltiazem     TAKE these medications   Advil PM 200-38 MG Tabs Generic drug: Ibuprofen-diphenhydrAMINE Cit Take 3 tablets by mouth at bedtime.   albuterol 108 (90 Base) MCG/ACT inhaler Commonly known as: VENTOLIN HFA Inhale 1-2 puffs into the lungs every 6 (six) hours as needed for wheezing or shortness of breath.   ALPRAZolam 0.5 MG tablet Commonly known as: XANAX Take 0.5 mg by mouth at bedtime.   aspirin EC 81 MG tablet Take 81 mg by mouth daily.   Biotin 1000 MCG tablet Take 1,000 mcg by mouth 3 (three) times daily.   budesonide  3 MG 24 hr capsule Commonly known as: ENTOCORT EC Take 3 capsules (9 mg total) by mouth daily. Start taking on: April 11, 2019 What changed: See the new instructions.   carboxymethylcellulose 0.5 % Soln Commonly known as: REFRESH PLUS Place 1 drop into both eyes 2 (two) times daily.   chlorpheniramine-HYDROcodone 10-8 MG/5ML Suer Commonly known as: Tussionex Pennkinetic ER Take 5 mLs by mouth at bedtime as needed for cough.   diclofenac 75 MG EC tablet Commonly known as: VOLTAREN Take 75 mg by mouth 2 (two) times daily.   diltiazem 180 MG 24 hr capsule Commonly known as: DILACOR XR Take 180 mg by mouth daily.   EpiPen 2-Pak 0.3 mg/0.3 mL Soaj injection Generic drug: EPINEPHrine Inject 0.3 mg into the muscle once.   glucose blood test strip 1 each by Other route as needed for other. Use as instructed   hydrochlorothiazide 12.5 MG tablet Commonly known as: HYDRODIURIL Take 12.5 mg by mouth daily.   hyoscyamine 0.125 MG tablet Commonly known as: LEVSIN Take 0.125 mg by mouth every 4 (four) hours as needed.   loperamide 2 MG capsule  Commonly known as: IMODIUM Take 1 capsule (2 mg total) by mouth 4 (four) times daily.   Melatonin 5 MG Tabs Take 5 mg by mouth at bedtime.   memantine 5 MG tablet Commonly known as: NAMENDA Take 5 mg by mouth 2 (two) times daily.   mesalamine 1.2 g EC tablet Commonly known as: LIALDA Take 4.8 g by mouth daily with breakfast.   metoprolol tartrate 50 MG tablet Commonly known as: LOPRESSOR TAKE 1 TABLET(50 MG) BY MOUTH TWICE DAILY   ondansetron 4 MG disintegrating tablet Commonly known as: ZOFRAN-ODT Take 4 mg by mouth every 8 (eight) hours as needed for nausea or vomiting.   potassium chloride 10 MEQ tablet Commonly known as: K-DUR Take 10 mEq by mouth 2 (two) times daily.   prazosin 1 MG capsule Commonly known as: MINIPRESS Take 1 mg by mouth at bedtime.   RABEprazole 20 MG tablet Commonly known as: ACIPHEX Take 20 mg by mouth daily. 73minutes prior to a meal.   rosuvastatin 40 MG tablet Commonly known as: CRESTOR Take 1 tablet (40 mg total) by mouth daily.   sertraline 100 MG tablet Commonly known as: ZOLOFT Take 150 mg by mouth daily.   sucralfate 1 g tablet Commonly known as: CARAFATE Take 1 g by mouth 2 (two) times daily.   vitamin B-12 1000 MCG tablet Commonly known as: CYANOCOBALAMIN Take 1,000 mcg by mouth daily.   Vitamin D (Ergocalciferol) 1.25 MG (50000 UT) Caps capsule Commonly known as: DRISDOL Take 50,000 Units by mouth every Saturday.   vitamin E 600 UNIT capsule Take 600 Units by mouth 2 (two) times daily.   Xolair 150 MG/ML prefilled syringe Generic drug: omalizumab Inject into the skin.        DISCHARGE INSTRUCTIONS:   DIET:  Cardiac diet and soft diet consistency DISCHARGE CONDITION:  Stable ACTIVITY:  Activity as tolerated OXYGEN:  Home Oxygen: No.  Oxygen Delivery: room air DISCHARGE LOCATION:  home   If you experience worsening of your admission symptoms, develop shortness of breath, life threatening emergency,  suicidal or homicidal thoughts you must seek medical attention immediately by calling 911 or calling your MD immediately  if symptoms less severe.  You Must read complete instructions/literature along with all the possible adverse reactions/side effects for all the Medicines you take and that have been prescribed to you. Take any  new Medicines after you have completely understood and accpet all the possible adverse reactions/side effects.   Please note  You were cared for by a hospitalist during your hospital stay. If you have any questions about your discharge medications or the care you received while you were in the hospital after you are discharged, you can call the unit and asked to speak with the hospitalist on call if the hospitalist that took care of you is not available. Once you are discharged, your primary care physician will handle any further medical issues. Please note that NO REFILLS for any discharge medications will be authorized once you are discharged, as it is imperative that you return to your primary care physician (or establish a relationship with a primary care physician if you do not have one) for your aftercare needs so that they can reassess your need for medications and monitor your lab values.    On the day of Discharge:  VITAL SIGNS:  Blood pressure 123/67, pulse 68, temperature 97.9 F (36.6 C), temperature source Oral, resp. rate 18, height 5\' 8"  (1.727 m), weight 109.6 kg, SpO2 96 %. PHYSICAL EXAMINATION:  GENERAL:  73 y.o.-year-old patient lying in the bed with no acute distress.  EYES: Pupils equal, round, reactive to light and accommodation. No scleral icterus. Extraocular muscles intact.  HEENT: Head atraumatic, normocephalic. Oropharynx and nasopharynx clear.  NECK:  Supple, no jugular venous distention. No thyroid enlargement, no tenderness.  LUNGS: Normal breath sounds bilaterally, no wheezing, rales,rhonchi or crepitation. No use of accessory muscles of  respiration.  CARDIOVASCULAR: S1, S2 normal. No murmurs, rubs, or gallops.  ABDOMEN: Soft, non-tender, non-distended. Bowel sounds present. No organomegaly or mass.  EXTREMITIES: No pedal edema, cyanosis, or clubbing.  NEUROLOGIC: Cranial nerves II through XII are intact. Muscle strength 5/5 in all extremities. Sensation intact. Gait not checked.  PSYCHIATRIC: The patient is alert and oriented x 3.  SKIN: No obvious rash, lesion, or ulcer.  DATA REVIEW:   CBC Recent Labs  Lab 04/08/19 0418  WBC 5.3  HGB 11.3*  HCT 34.3*  PLT 154    Chemistries  Recent Labs  Lab 04/06/19 1848  04/10/19 0519  NA 135   < > 138  K 3.6   < > 3.9  CL 99   < > 114*  CO2 25   < > 20*  GLUCOSE 145*   < > 129*  BUN 12   < > 9  CREATININE 0.79   < > 0.43*  CALCIUM 9.4   < > 8.7*  MG  --    < > 1.7  AST 95*  --   --   ALT 63*  --   --   ALKPHOS 50  --   --   BILITOT 1.1  --   --    < > = values in this interval not displayed.     Microbiology Results  Results for orders placed or performed during the hospital encounter of 04/06/19  Gastrointestinal Panel by PCR , Stool     Status: None   Collection Time: 04/06/19  6:48 PM   Specimen: Stool  Result Value Ref Range Status   Campylobacter species NOT DETECTED NOT DETECTED Final   Plesimonas shigelloides NOT DETECTED NOT DETECTED Final   Salmonella species NOT DETECTED NOT DETECTED Final   Yersinia enterocolitica NOT DETECTED NOT DETECTED Final   Vibrio species NOT DETECTED NOT DETECTED Final   Vibrio cholerae NOT DETECTED NOT DETECTED Final  Enteroaggregative E coli (EAEC) NOT DETECTED NOT DETECTED Final   Enteropathogenic E coli (EPEC) NOT DETECTED NOT DETECTED Final   Enterotoxigenic E coli (ETEC) NOT DETECTED NOT DETECTED Final   Shiga like toxin producing E coli (STEC) NOT DETECTED NOT DETECTED Final   Shigella/Enteroinvasive E coli (EIEC) NOT DETECTED NOT DETECTED Final   Cryptosporidium NOT DETECTED NOT DETECTED Final   Cyclospora  cayetanensis NOT DETECTED NOT DETECTED Final   Entamoeba histolytica NOT DETECTED NOT DETECTED Final   Giardia lamblia NOT DETECTED NOT DETECTED Final   Adenovirus F40/41 NOT DETECTED NOT DETECTED Final   Astrovirus NOT DETECTED NOT DETECTED Final   Norovirus GI/GII NOT DETECTED NOT DETECTED Final   Rotavirus A NOT DETECTED NOT DETECTED Final   Sapovirus (I, II, IV, and V) NOT DETECTED NOT DETECTED Final    Comment: Performed at [facility], [address] [place], [place] [address]  C Difficile Quick Screen w PCR reflex     Status: None   Collection Time: 04/06/19  6:48 PM   Specimen: Stool  Result Value Ref Range Status   C Diff antigen NEGATIVE NEGATIVE Final   C Diff toxin NEGATIVE NEGATIVE Final   C Diff interpretation No C. difficile detected.  Final    Comment: Performed at [facility], [address] [place], [place] [address]  SARS Coronavirus 2 (CEPHEID- Performed in [facility] hospital lab), Hosp Order     Status: None   Collection Time: 04/06/19  7:48 PM   Specimen: Nasopharyngeal Swab  Result Value Ref Range Status   SARS Coronavirus 2 NEGATIVE NEGATIVE Final    Comment: (NOTE) If result is NEGATIVE SARS-CoV-2 target nucleic acids are NOT DETECTED. The SARS-CoV-2 RNA is generally detectable in upper and lower  respiratory specimens during the acute phase of infection. The lowest  concentration of SARS-CoV-2 viral copies this assay can detect is 250  copies / mL. A negative result does not preclude SARS-CoV-2 infection  and should not be used as the sole basis for treatment or other  patient management decisions.  A negative result may occur with  improper specimen collection / handling, submission of specimen other  than nasopharyngeal swab, presence of viral mutation(s) within the  areas targeted by this assay, and inadequate number of viral copies  (<250 copies / mL). A negative result must be combined with clinical  observations, patient  history, and epidemiological information. If result is POSITIVE SARS-CoV-2 target nucleic acids are DETECTED. The SARS-CoV-2 RNA is generally detectable in upper and lower  respiratory specimens dur ing the acute phase of infection.  Positive  results are indicative of active infection with SARS-CoV-2.  Clinical  correlation with patient history and other diagnostic information is  necessary to determine patient infection status.  Positive results do  not rule out bacterial infection or co-infection with other viruses. If result is PRESUMPTIVE POSTIVE SARS-CoV-2 nucleic acids MAY BE PRESENT.   A presumptive positive result was obtained on the submitted specimen  and confirmed on repeat testing.  While 2019 novel coronavirus  (SARS-CoV-2) nucleic acids may be present in the submitted sample  additional confirmatory testing may be necessary for epidemiological  and / or clinical management purposes  to differentiate between  SARS-CoV-2 and other Sarbecovirus currently known to infect humans.  If clinically indicated additional testing with an alternate test  methodology [phone]) is advised. The SARS-CoV-2 RNA is generally  detectable in upper and lower respiratory sp ecimens during the acute  phase  of infection. The expected result is Negative. Fact Sheet for Patients:  StrictlyIdeas.no Fact Sheet for Healthcare Providers: BankingDealers.co.za This test is not yet approved or cleared by the [place] FDA and has been authorized for detection and/or diagnosis of SARS-CoV-2 by FDA under an Emergency Use Authorization (EUA).  This EUA will remain in effect (meaning this test can be used) for the duration of the COVID-19 declaration under Section 564(b)(1) of the Act, 21 U.S.C. section 360bbb-3(b)(1), unless the authorization is terminated or revoked sooner. Performed at [facility], [address] [place], [place] [address]      RADIOLOGY:  No results found.   Management plans discussed with the patient, family and they are in agreement.  CODE STATUS: Full Code   TOTAL TIME TAKING CARE OF THIS PATIENT: 37 minutes.    Katalyna Socarras M.D on 04/10/2019 at 9:32 AM  Between 7am to 6pm - Pager - [phone]  After 6pm go to [email or web address] - Proofreader  [facility]  Office  [phone]  CC: Primary care physician; Maryland Pink, MD   Note: This dictation was prepared with Dragon dictation along with smaller phrase technology. Any transcriptional errors that result from this process are unintentional.

## 2019-04-10 NOTE — Plan of Care (Signed)
  Problem: Education: Goal: Knowledge of General Education information will improve Description: Including pain rating scale, medication(s)/side effects and non-pharmacologic comfort measures Outcome: Adequate for Discharge   Problem: Health Behavior/Discharge Planning: Goal: Ability to manage health-related needs will improve Outcome: Adequate for Discharge   Problem: Clinical Measurements: Goal: Ability to maintain clinical measurements within normal limits will improve Outcome: Adequate for Discharge Goal: Will remain free from infection Outcome: Adequate for Discharge Goal: Diagnostic test results will improve Outcome: Adequate for Discharge Goal: Respiratory complications will improve Outcome: Adequate for Discharge Goal: Cardiovascular complication will be avoided Outcome: Adequate for Discharge   Problem: Activity: Goal: Risk for activity intolerance will decrease Outcome: Adequate for Discharge   Problem: Coping: Goal: Level of anxiety will decrease Outcome: Adequate for Discharge   Problem: Elimination: Goal: Will not experience complications related to bowel motility Outcome: Adequate for Discharge Goal: Will not experience complications related to urinary retention Outcome: Adequate for Discharge   Problem: Pain Managment: Goal: General experience of comfort will improve Outcome: Adequate for Discharge   Problem: Safety: Goal: Ability to remain free from injury will improve Outcome: Adequate for Discharge   Problem: Skin Integrity: Goal: Risk for impaired skin integrity will decrease Outcome: Adequate for Discharge   Problem: Acute Rehab PT Goals(only PT should resolve) Goal: Pt Will Ambulate Outcome: Adequate for Discharge Goal: Pt/caregiver will Perform Home Exercise Program Outcome: Adequate for Discharge Goal: PT Additional Goal #1 Outcome: Adequate for Discharge

## 2019-04-14 ENCOUNTER — Ambulatory Visit (INDEPENDENT_AMBULATORY_CARE_PROVIDER_SITE_OTHER): Payer: Medicare Other

## 2019-04-14 ENCOUNTER — Other Ambulatory Visit: Payer: Self-pay

## 2019-04-14 DIAGNOSIS — J4540 Moderate persistent asthma, uncomplicated: Secondary | ICD-10-CM | POA: Diagnosis not present

## 2019-04-14 MED ORDER — OMALIZUMAB 150 MG ~~LOC~~ SOLR
375.0000 mg | SUBCUTANEOUS | Status: DC
  Administered 2019-04-14: 15:00:00 375 mg via SUBCUTANEOUS

## 2019-04-14 NOTE — Progress Notes (Signed)
Have you been hospitalized within the last 10 days?  No Do you have a fever?  No Do you have a cough?  Yes dry Do you have a headache or sore throat? No

## 2019-04-20 ENCOUNTER — Telehealth: Payer: Self-pay | Admitting: Pulmonary Disease

## 2019-04-20 NOTE — Telephone Encounter (Signed)
Xolair Order: # vials: 6 Ordered date: 04/20/2019 Expected date of arrival: 04/26/2019 Ordered by: Desmond Dike, [place]: [facility]

## 2019-04-26 NOTE — Telephone Encounter (Signed)
Xolair Received: # Vials: 6 Medication arrival date: 04/26/19 Lot #: [id number] Exp date: Jan 2024 Received by: Suzi Roots

## 2019-04-27 ENCOUNTER — Encounter: Payer: Self-pay | Admitting: Emergency Medicine

## 2019-04-27 ENCOUNTER — Emergency Department
Admission: EM | Admit: 2019-04-27 | Discharge: 2019-04-27 | Disposition: A | Discharge status: Home / Self Care | Payer: Medicare Other | Source: Home / Self Care | Attending: Student in an Organized Health Care Education/Training Program | Admitting: Student in an Organized Health Care Education/Training Program

## 2019-04-27 ENCOUNTER — Emergency Department: Payer: Medicare Other

## 2019-04-27 ENCOUNTER — Other Ambulatory Visit: Payer: Self-pay

## 2019-04-27 DIAGNOSIS — R1030 Lower abdominal pain, unspecified: Secondary | ICD-10-CM | POA: Insufficient documentation

## 2019-04-27 DIAGNOSIS — I10 Essential (primary) hypertension: Secondary | ICD-10-CM | POA: Insufficient documentation

## 2019-04-27 DIAGNOSIS — R112 Nausea with vomiting, unspecified: Secondary | ICD-10-CM

## 2019-04-27 DIAGNOSIS — J449 Chronic obstructive pulmonary disease, unspecified: Secondary | ICD-10-CM | POA: Insufficient documentation

## 2019-04-27 DIAGNOSIS — Z79899 Other long term (current) drug therapy: Secondary | ICD-10-CM | POA: Insufficient documentation

## 2019-04-27 DIAGNOSIS — Z8673 Personal history of transient ischemic attack (TIA), and cerebral infarction without residual deficits: Secondary | ICD-10-CM | POA: Insufficient documentation

## 2019-04-27 DIAGNOSIS — E119 Type 2 diabetes mellitus without complications: Secondary | ICD-10-CM | POA: Insufficient documentation

## 2019-04-27 DIAGNOSIS — Z96652 Presence of left artificial knee joint: Secondary | ICD-10-CM | POA: Insufficient documentation

## 2019-04-27 DIAGNOSIS — R197 Diarrhea, unspecified: Secondary | ICD-10-CM | POA: Insufficient documentation

## 2019-04-27 LAB — COMPREHENSIVE METABOLIC PANEL
ALT: 36 U/L (ref 0–44)
AST: 27 U/L (ref 15–41)
Albumin: 3.1 g/dL — ABNORMAL LOW (ref 3.5–5.0)
Alkaline Phosphatase: 87 U/L (ref 38–126)
Anion gap: 12 (ref 5–15)
BUN: 13 mg/dL (ref 8–23)
CO2: 20 mmol/L — ABNORMAL LOW (ref 22–32)
Calcium: 9.4 mg/dL (ref 8.9–10.3)
Chloride: 100 mmol/L (ref 98–111)
Creatinine, Ser: 0.96 mg/dL (ref 0.44–1.00)
GFR calc Af Amer: >60 mL/min (ref >60)
GFR calc non Af Amer: 60 mL/min — ABNORMAL LOW (ref >60)
Glucose, Bld: 201 mg/dL — ABNORMAL HIGH (ref 70–99)
Potassium: 3.5 mmol/L (ref 3.5–5.1)
Sodium: 132 mmol/L — ABNORMAL LOW (ref 135–145)
Total Bilirubin: 0.8 mg/dL (ref 0.3–1.2)
Total Protein: 7 g/dL (ref 6.5–8.1)

## 2019-04-27 LAB — CBC
HCT: 42.4 % (ref 36.0–46.0)
Hemoglobin: 14.1 g/dL (ref 12.0–15.0)
MCH: 30.4 pg (ref 26.0–34.0)
MCHC: 33.3 g/dL (ref 30.0–36.0)
MCV: 91.4 fL (ref 80.0–100.0)
Platelets: 338 10*3/uL (ref 150–400)
RBC: 4.64 MIL/uL (ref 3.87–5.11)
RDW: 13.3 % (ref 11.5–15.5)
WBC: 10.1 10*3/uL (ref 4.0–10.5)
nRBC: 0 % (ref 0.0–0.2)

## 2019-04-27 LAB — C DIFFICILE QUICK SCREEN W PCR REFLEX
C Diff antigen: NEGATIVE
C Diff interpretation: NOT DETECTED
C Diff toxin: NEGATIVE

## 2019-04-27 LAB — LIPASE, BLOOD: Lipase: 23 U/L (ref 11–51)

## 2019-04-27 MED ORDER — ONDANSETRON 4 MG PO TBDP: 4.0000 mg | ORAL_TABLET | Freq: Three times a day (TID) | ORAL | 0 refills | Status: DC | PRN

## 2019-04-27 MED ORDER — SODIUM CHLORIDE 0.9 % IV BOLUS
500.0000 mL | Freq: Once | INTRAVENOUS | Status: AC
  Administered 2019-04-27: 18:00:00 500 mL via INTRAVENOUS

## 2019-04-27 MED ORDER — ONDANSETRON HCL 4 MG/2ML IJ SOLN: 4.0000 mg | Freq: Once | INTRAMUSCULAR | Status: DC

## 2019-04-27 MED ORDER — SODIUM CHLORIDE 0.9 % IV BOLUS
500.0000 mL | Freq: Once | INTRAVENOUS | Status: AC
  Administered 2019-04-27: 500 mL via INTRAVENOUS

## 2019-04-27 MED ORDER — LOPERAMIDE HCL 2 MG PO CAPS: 2.0000 mg | ORAL_CAPSULE | Freq: Once | ORAL | Status: DC

## 2019-04-27 MED ORDER — LOPERAMIDE HCL 2 MG PO CAPS
4.0000 mg | ORAL_CAPSULE | Freq: Once | ORAL | Status: AC
  Administered 2019-04-27: 4 mg via ORAL
  Filled 2019-04-27: qty 2

## 2019-04-27 MED ORDER — ONDANSETRON HCL 4 MG/2ML IJ SOLN
4.0000 mg | Freq: Once | INTRAMUSCULAR | Status: AC
  Administered 2019-04-27: 4 mg via INTRAVENOUS
  Filled 2019-04-27: qty 2

## 2019-04-27 NOTE — ED Notes (Signed)
When pt taken back to the room, states she had had a BM and had been sitting in it for the past 4 hrs, ask the why she did not notify staff, states she noticed we were busy and did not want to bother [place]. Pt assisted to the toilet and given wet wipes to clean up and RN notified.

## 2019-04-27 NOTE — ED Provider Notes (Signed)
[facility] Emergency Department Provider Note    First MD Initiated Contact with Patient 04/27/19 1809     (approximate)  I have reviewed the triage vital signs and the nursing notes.   HISTORY  Chief Complaint Diarrhea    HPI Caitlin Leonard is a 73 y.o. female below has a past medical history with recent admission to hospital for diarrhea diagnosed with collagenous colitis started on oral steroids as well as antispasmodics presents to the ER for persistent nausea vomiting and diarrhea.  States that his diarrheal episodes have decreased from 8/day down to 6.  Did not take all of her medications today.  Came to the ER because she was being seen in GI clinic today and had episode of vomiting and was sent to the ER for further evaluation.  She denies any epigastric pain.  States that she has some crampy lower abdominal pain.  Is able to eat and drink.  Felt like she had fever.    Past Medical History:  Diagnosis Date  . Anemia   . Anxiety   . Arthritis    osteoarthritis  . Asthma    due to seasonal alllergies  . Atrial fibrillation ([place])   . Carpal tunnel syndrome   . Cataract   . Cervicalgia   . Collagenous colitis 2010  . COPD (chronic obstructive pulmonary disease) ([place])   . Depression    situational  . Diabetes mellitus without complication ([place])   . Dysrhythmia   . GERD (gastroesophageal reflux disease)   . GI bleed   . Headache(784.0)    migraines; 2 x month  . Heart murmur    MVP ( symptomatic)  . History of IBS   . Hyperlipemia   . Hypertension    on medication x 10 years  . Migraine   . MVP (mitral valve prolapse)   . Shortness of breath    exertional  . Sinoatrial node dysfunction (HCC)   . Sleep apnea 2007   does not use CPAP since 40 # wt loss  . Stroke ([place])   . Syncopal episodes    Family History  Problem Relation Age of Onset  . Hypertension Mother   . Hyperlipidemia Mother   . Diabetes Mother   . Hypertension  Father   . Hyperlipidemia Father   . Arrhythmia Father        A-Fib  . Diabetes Paternal Uncle   . Diabetes Paternal Grandfather   . Breast cancer Maternal Aunt        great in late 63's  . Anesthesia problems Neg Hx   . Colon cancer Neg Hx   . Stomach cancer Neg Hx   . Rectal cancer Neg Hx   . Liver cancer Neg Hx   . Esophageal cancer Neg Hx    Past Surgical History:  Procedure Laterality Date  . ABDOMINAL HYSTERECTOMY  1979  . BACK SURGERY    . BREAST CYST ASPIRATION Right 25 plus yrs ago  . CARDIAC CATHETERIZATION  2012   [facility]  . CHOLECYSTECTOMY  2011  . COLONOSCOPY WITH PROPOFOL N/A 03/25/2019   Procedure: COLONOSCOPY WITH PROPOFOL;  Surgeon: Lollie Sails, MD;  [facility] END[facility]COPY;  Service: Endoscopy;  Laterality: N/A;  . ESOPHAGOGASTRODUODENOSCOPY (EGD) WITH PROPOFOL N/A 03/25/2019   Procedure: ESOPHAGOGASTRODUODENOSCOPY (EGD) WITH PROPOFOL;  Surgeon: Lollie Sails, MD;  Location[facility] ENDOSCOPY;  Service: Endoscopy;  Laterality: N/A;  . FLEXIBLE BRONCHOSCOPY N/A 10/10/2016   Procedure: FLEXIBLE BRONCHOSCOPY;  Surgeon:  [name], MD;  Location: [facility] ORS;  Service: Pulmonary;  Laterality: N/A;  . FLEXIBLE BRONCHOSCOPY N/A 12/11/2017   Procedure: FLEXIBLE BRONCHOSCOPY;  Surgeon: [name], MD;  Location: [facility] ORS;  Service: Pulmonary;  Laterality: N/A;  . [place], 2012 x 2   bilateral  . LUMBAR LAMINECTOMY  [id number],[id number]  . NASAL SINUS SURGERY  1994  . TONSILLECTOMY    . TOTAL KNEE ARTHROPLASTY  12/22/2011   Procedure: TOTAL KNEE ARTHROPLASTY;  Surgeon: Rudean Haskell, MD;  Location: [place];  Service: Orthopedics;  Laterality: Left;   Patient Active Problem List   Diagnosis Date Noted  . Collagenous colitis   . Dehydration 04/07/2019  . Leg swelling 03/19/2016  . Essential hypertension 03/19/2016  . Obesity 09/10/2015  . Bronchitis, acute 04/11/2015  . OSA (obstructive sleep apnea) 10/18/2014  . Nodule  of right lung 06/14/2014  . Dyspnea 06/13/2014  . Coronary artery calcification 04/17/2014  . Aneurysm, ascending aorta ([place]) 04/17/2014  . Chronic cough 02/24/2014  . Mold suspected exposure 02/24/2014      Prior to Admission medications   Medication Sig Start Date End Date Taking? Authorizing Provider  albuterol (PROVENTIL HFA;VENTOLIN HFA) 108 (90 Base) MCG/ACT inhaler Inhale 1-2 puffs into the lungs every 6 (six) hours as needed for wheezing or shortness of breath. 06/08/18   [name], MD  ALPRAZolam Duanne Moron) 0.5 MG tablet Take 0.5 mg by mouth at bedtime.     [provider]  aspirin EC 81 MG tablet Take 81 mg by mouth daily.    [provider]  Biotin 1000 MCG tablet Take 1,000 mcg by mouth 3 (three) times daily.    [provider]  budesonide (ENTOCORT EC) 3 MG 24 hr capsule Take 3 capsules (9 mg total) by mouth daily. 04/11/19   Stark Jock Jude, MD  carboxymethylcellulose (REFRESH PLUS) 0.5 % SOLN Place 1 drop into both eyes 2 (two) times daily.     [provider]  chlorpheniramine-HYDROcodone (TUSSIONEX PENNKINETIC ER) 10-8 MG/5ML SUER Take 5 mLs by mouth at bedtime as needed for cough. 02/07/19   [name], MD  diclofenac (VOLTAREN) 75 MG EC tablet Take 75 mg by mouth 2 (two) times daily.    [provider]  diltiazem (DILACOR XR) 180 MG 24 hr capsule Take 180 mg by mouth daily.    [provider]  EPIPEN 2-PAK 0.3 MG/0.3ML SOAJ injection Inject 0.3 mg into the muscle once.     [provider]  glucose blood test strip 1 each by Other route as needed for other. Use as instructed    [provider]  hydrochlorothiazide (HYDRODIURIL) 12.5 MG tablet Take 12.5 mg by mouth daily. 03/15/18   [provider]  hyoscyamine (LEVSIN) 0.125 MG tablet Take 0.125 mg by mouth every 4 (four) hours as needed.    [provider]  Ibuprofen-diphenhydrAMINE Cit (ADVIL PM) 200-38 MG TABS Take 3 tablets by mouth  at bedtime.    [provider]  loperamide (IMODIUM) 2 MG capsule Take 1 capsule (2 mg total) by mouth 4 (four) times daily. 04/10/19   Stark Jock Jude, MD  Melatonin 5 MG TABS Take 5 mg by mouth at bedtime.    [provider]  memantine (NAMENDA) 5 MG tablet Take 5 mg by mouth 2 (two) times daily. 03/13/19 03/12/20  [provider]  mesalamine (LIALDA) 1.2 g EC tablet Take 4.8 g by  mouth daily with breakfast. 04/04/19 05/04/19  [provider]  metoprolol tartrate (LOPRESSOR) 50 MG tablet TAKE 1 TABLET(50 MG) BY MOUTH TWICE DAILY 11/24/18   [name], MD  Omalizumab Arvid Right) 150 MG/ML SOSY Inject into the skin.    [provider]  ondansetron (ZOFRAN-ODT) 4 MG disintegrating tablet Take 4 mg by mouth every 8 (eight) hours as needed for nausea or vomiting.    [provider]  potassium chloride (K-DUR) 10 MEQ tablet Take 10 mEq by mouth 2 (two) times daily. 03/30/19 03/29/20  [provider]  prazosin (MINIPRESS) 1 MG capsule Take 1 mg by mouth at bedtime. 03/11/19 03/10/20  [provider]  RABEprazole (ACIPHEX) 20 MG tablet Take 20 mg by mouth daily. 97mnutes prior to a meal. 03/25/19   [provider]  rosuvastatin (CRESTOR) 40 MG tablet Take 1 tablet (40 mg total) by mouth daily. 11/11/16 04/06/19  GMinna Merritts MD  sertraline (ZOLOFT) 100 MG tablet Take 150 mg by mouth daily.     [provider]  sucralfate (CARAFATE) 1 g tablet Take 1 g by mouth 2 (two) times daily. 03/25/19   [provider]  vitamin B-12 (CYANOCOBALAMIN) 1000 MCG tablet Take 1,000 mcg by mouth daily.    [provider]  Vitamin D, Ergocalciferol, (DRISDOL) 50000 units CAPS capsule Take 50,000 Units by mouth every Saturday.  10/08/16   [provider]  vitamin E 600 UNIT capsule Take 600 Units by mouth 2 (two) times daily.    [provider]    Allergies Contrast media [iodinated diagnostic agents], Gadolinium  derivatives, Gadoversetamide, Shellfish allergy, Sulfa antibiotics, Hydroxychloroquine sulfate, Banana, Limonene, Morphine and related, Nabumetone, Otezla [apremilast], Potassium-containing compounds, Prednisone, Sulfasalazine, Sulfonamide derivatives, Metronidazole, and Sulfamethoxazole    Social History Social History   Tobacco Use  . Smoking status: Never Smoker  . Smokeless tobacco: Never Used  Substance Use Topics  . Alcohol use: Yes    Alcohol/week: 1.0 standard drinks    Types: 1 Glasses of wine per week    Comment: occasional wine - less than once a month  . Drug use: Never    Review of Systems Patient denies headaches, rhinorrhea, blurry vision, numbness, shortness of breath, chest pain, edema, cough, abdominal pain, nausea, vomiting, diarrhea, dysuria, fevers, rashes or hallucinations unless otherwise stated above in HPI. ____________________________________________   PHYSICAL EXAM:  VITAL SIGNS: Vitals:   04/27/19 1359 04/27/19 1831  BP: 122/67 114/62  Pulse:  89  Resp:  17  Temp:    SpO2:  90%    Constitutional: Alert and oriented.  Eyes: Conjunctivae are normal.  Head: Atraumatic. Nose: No congestion/rhinnorhea. Mouth/Throat: Mucous membranes are moist.   Neck: No stridor. Painless ROM.  Cardiovascular: Normal rate, regular rhythm. Grossly normal heart sounds.  Good peripheral circulation. Respiratory: Normal respiratory effort.  No retractions. Lungs CTAB. Gastrointestinal: Soft, obese and nontender. No distention. No abdominal bruits. No CVA tenderness. Genitourinary:  Musculoskeletal: No lower extremity tenderness nor edema.  No joint effusions. Neurologic:  Normal speech and language. No gross focal neurologic deficits are appreciated. No facial droop Skin:  Skin is warm, dry and intact. No rash noted. Psychiatric: Mood and affect are normal. Speech and behavior are normal.  ____________________________________________   LABS (all labs ordered are  listed, but only abnormal results are displayed)  Results for orders placed or performed during the hospital encounter of 04/27/19 (from the past 24 hour(s))  Lipase, blood     Status: None  Collection Time: 04/27/19  2:01 PM  Result Value Ref Range   Lipase 23 11 - 51 U/L  Comprehensive metabolic panel     Status: Abnormal   Collection Time: 04/27/19  2:01 PM  Result Value Ref Range   Sodium 132 (L) 135 - 145 mmol/L   Potassium 3.5 3.5 - 5.1 mmol/L   Chloride 100 98 - 111 mmol/L   CO2 20 (L) 22 - 32 mmol/L   Glucose, Bld 201 (H) 70 - 99 mg/dL   BUN 13 8 - 23 mg/dL   Creatinine, Ser 0.96 0.44 - 1.00 mg/dL   Calcium 9.4 8.9 - 10.3 mg/dL   Total Protein 7.0 6.5 - 8.1 g/dL   Albumin 3.1 (L) 3.5 - 5.0 g/dL   AST 27 15 - 41 U/L   ALT 36 0 - 44 U/L   Alkaline Phosphatase 87 38 - 126 U/L   Total Bilirubin 0.8 0.3 - 1.2 mg/dL   GFR calc non Af Amer 60 (L) >60 mL/min   GFR calc Af Amer >60 >60 mL/min   Anion gap 12 5 - 15  CBC     Status: None   Collection Time: 04/27/19  2:01 PM  Result Value Ref Range   WBC 10.1 4.0 - 10.5 K/uL   RBC 4.64 3.87 - 5.11 MIL/uL   Hemoglobin 14.1 12.0 - 15.0 g/dL   HCT 42.4 36.0 - 46.0 %   MCV 91.4 80.0 - 100.0 fL   MCH 30.4 26.0 - 34.0 pg   MCHC 33.3 30.0 - 36.0 g/dL   RDW 13.3 11.5 - 15.5 %   Platelets 338 150 - 400 K/uL   nRBC 0.0 0.0 - 0.2 %   ____________________________________________  EKG My review and personal interpretation at Time: 14:06   Indication: n/v  Rate: 115  Rhythm: subys wutg dysrhythmia Axis: normal Other: borderline prolonged qt ____________________________________________  RADIOLOGY  I personally reviewed all radiographic images ordered to evaluate for the above acute complaints and reviewed radiology reports and findings.  These findings were personally discussed with the patient.  Please see medical record for radiology report.  ____________________________________________   PROCEDURES  Procedure(s) performed:   Procedures    Critical Care performed: no ____________________________________________   INITIAL IMPRESSION / ASSESSMENT AND PLAN / ED COURSE  Pertinent labs & imaging results that were available during my care of the patient were reviewed by me and considered in my medical decision making (see chart for details).   DDX: enteritis, colitis, dehydration, electrolyte abn, infectious colitis, sbo  Caitlin Leonard is a 73 y.o. who presents to the ED with symptoms as described above.  And states that her diarrheal illnesses improving with Imodium.  Abdominal exam does have some mild tenderness.  X-ray ordered does show some nonspecific air-fluid levels.  Will order CT imaging to exclude SBO.  Blood work is reassuring.  She has no fever but is mildly tachycardic will give some IV hydration.  May be having some volume losses secondary to chronic diarrhea.  On review of the medical records it does seem that this is to be a chronic issue.  Clinical Course as of Apr 26 2110  Wed Apr 27, 2019  2053 CT imaging is reassuring.  Repeat abdominal exam is soft and benign.  At this point I do not see any indication for hospitalization no significant AKI or electrolyte abnormality.  Patient became upset when told her that her blood work and what not was reassuring at this point.  States that she has not eaten in days however discussed that she does not have any significant electrolyte abnormality that would suggest that is the case and that her vital signs are stable and as she has a chronic diarrheal illness I do believe she be appropriate for outpatient follow-up.  Patient states that she thought that she was admitted by her GI doctor today.  I informed her that she was sent to the ER for evaluation by them due to her nausea and vomiting but that her work-up here is not showing any evidence of acute pathology.  Discussed option for admission to hospital for gentle IV hydration overnight but patient declining this.   Will give additional IV fluid.  We will try to obtain stool studies to see if there is a component of infectious colitis however discussed my concern with the patient that this very likely will be some form of chronic diarrheal illness that we will have to manage.   [PR]  2107 Discussed case in consultation with Dr. Alice Reichert of GI.  Discussed she has reassuring blood work and vital signs this is a chronic inflammatory colitis appropriate for further work-up as an outpatient.  No indication for hospitalization if tolerating PO.  We will try to rule out any infectious component here but if she is unable to provide any stool when she remains hemodynamically stable I do believe it would be appropriate for her to follow-up in clinic tomorrow.  Patient is tolerating PO.  Have discussed with the patient and available family all diagnostics and treatments performed thus far and all questions were answered to the best of my ability. The patient demonstrates understanding and agreement with plan.    [PR]    Clinical Course User Index [PR] Merlyn Lot, MD    The patient was evaluated in Emergency Department today for the symptoms described in the history of present illness. He/she was evaluated in the context of the global COVID-19 pandemic, which necessitated consideration that the patient might be at risk for infection with the SARS-CoV-2 virus that causes COVID-19. Institutional protocols and algorithms that pertain to the evaluation of patients at risk for COVID-19 are in a state of rapid change based on information released by regulatory bodies including the CDC and federal and state organizations. These policies and algorithms were followed during the patient's care in the ED.  As part of my medical decision making, I reviewed the following data within the [place] notes reviewed and incorporated, Labs reviewed, notes from prior ED visits and [place] Controlled Substance Database    ____________________________________________   FINAL CLINICAL IMPRESSION(S) / ED DIAGNOSES  Final diagnoses:  Nausea and vomiting  Diarrhea, unspecified type      NEW MEDICATIONS STARTED DURING THIS VISIT:  New Prescriptions   No medications on file     Note:  This document was prepared using Dragon voice recognition software and may include unintentional dictation errors.    Merlyn Lot, MD 04/27/19 2111

## 2019-04-27 NOTE — Discharge Instructions (Signed)
As we discussed, I would recommend using Lomotil instead of Imodium given the history of prolonged QT.  Please be sure to drink plenty of fluids to help stay hydrated.  Follow-up in GI clinic.  You have been seen in the emergency department for emergency care. It is important that you contact your own doctor, specialist or the closest clinic for follow-up care. Please bring this instruction sheet, all medications and X-ray copies with you when you are seen for follow-up care.  Determining the exact cause for all patients with abdominal pain is extremely difficult in the emergency department. Our primary focus is to rule-out immediate life-threatening diseases. If no immediate source of pain is found the definitive diagnosis frequently needs to be determined over time.Many times your primary care physician can determine the cause by following the symptoms over time. Sometimes, specialist are required such as Gastroenterologists, Gynecologists, Urologists or Surgeons. Please return immediately to the Emergency Department for fever>101, Vomiting or Intractable Pain. You should return to the emergency department or see your primary care provider in 12-24hrs if your pain is no better and sooner if your pain becomes worse.

## 2019-04-27 NOTE — ED Triage Notes (Addendum)
Here for diarrhea X 10 days per pt.  Pt reports fever at home yesterday.  Some lower abdominal pain.  Was admitted for same recently/dehydration. No vomiting. Unlabored. VSS at this time. NAD.  Pt informed let RN know if going to have bowel movement.

## 2019-04-27 NOTE — ED Notes (Signed)
Pt able to eat plain saltine crackers and some ginger ale.

## 2019-04-28 ENCOUNTER — Ambulatory Visit: Payer: Medicare Other

## 2019-04-28 LAB — GASTROINTESTINAL PANEL BY PCR, STOOL (REPLACES STOOL CULTURE)

## 2019-04-30 ENCOUNTER — Inpatient Hospital Stay
Admission: EM | Admit: 2019-04-30 | Discharge: 2019-05-05 | DRG: 871 | Disposition: A | Discharge status: Home / Self Care | Payer: Medicare Other | Attending: Internal Medicine | Admitting: Internal Medicine

## 2019-04-30 ENCOUNTER — Emergency Department: Payer: Medicare Other

## 2019-04-30 ENCOUNTER — Other Ambulatory Visit: Payer: Self-pay

## 2019-04-30 ENCOUNTER — Inpatient Hospital Stay (HOSPITAL_COMMUNITY)
Admit: 2019-04-30 | Discharge: 2019-04-30 | Disposition: A | Discharge status: Home / Self Care | Payer: Medicare Other | Attending: Internal Medicine | Admitting: Internal Medicine

## 2019-04-30 DIAGNOSIS — Z66 Do not resuscitate: Secondary | ICD-10-CM | POA: Diagnosis present

## 2019-04-30 DIAGNOSIS — Z79899 Other long term (current) drug therapy: Secondary | ICD-10-CM

## 2019-04-30 DIAGNOSIS — R197 Diarrhea, unspecified: Secondary | ICD-10-CM | POA: Diagnosis present

## 2019-04-30 DIAGNOSIS — E119 Type 2 diabetes mellitus without complications: Secondary | ICD-10-CM | POA: Diagnosis present

## 2019-04-30 DIAGNOSIS — R9431 Abnormal electrocardiogram [ECG] [EKG]: Secondary | ICD-10-CM | POA: Diagnosis not present

## 2019-04-30 DIAGNOSIS — J4540 Moderate persistent asthma, uncomplicated: Secondary | ICD-10-CM

## 2019-04-30 DIAGNOSIS — Z8249 Family history of ischemic heart disease and other diseases of the circulatory system: Secondary | ICD-10-CM

## 2019-04-30 DIAGNOSIS — Z7982 Long term (current) use of aspirin: Secondary | ICD-10-CM

## 2019-04-30 DIAGNOSIS — Z888 Allergy status to other drugs, medicaments and biological substances status: Secondary | ICD-10-CM | POA: Diagnosis not present

## 2019-04-30 DIAGNOSIS — J82 Pulmonary eosinophilia, not elsewhere classified: Secondary | ICD-10-CM | POA: Diagnosis present

## 2019-04-30 DIAGNOSIS — E785 Hyperlipidemia, unspecified: Secondary | ICD-10-CM | POA: Diagnosis present

## 2019-04-30 DIAGNOSIS — I480 Paroxysmal atrial fibrillation: Secondary | ICD-10-CM | POA: Diagnosis present

## 2019-04-30 DIAGNOSIS — G43909 Migraine, unspecified, not intractable, without status migrainosus: Secondary | ICD-10-CM | POA: Diagnosis present

## 2019-04-30 DIAGNOSIS — N170 Acute kidney failure with tubular necrosis: Secondary | ICD-10-CM | POA: Diagnosis present

## 2019-04-30 DIAGNOSIS — R1084 Generalized abdominal pain: Secondary | ICD-10-CM

## 2019-04-30 DIAGNOSIS — Z96652 Presence of left artificial knee joint: Secondary | ICD-10-CM | POA: Diagnosis present

## 2019-04-30 DIAGNOSIS — R634 Abnormal weight loss: Secondary | ICD-10-CM | POA: Diagnosis present

## 2019-04-30 DIAGNOSIS — R112 Nausea with vomiting, unspecified: Secondary | ICD-10-CM

## 2019-04-30 DIAGNOSIS — R6521 Severe sepsis with septic shock: Secondary | ICD-10-CM | POA: Diagnosis present

## 2019-04-30 DIAGNOSIS — E860 Dehydration: Secondary | ICD-10-CM

## 2019-04-30 DIAGNOSIS — Z91041 Radiographic dye allergy status: Secondary | ICD-10-CM

## 2019-04-30 DIAGNOSIS — Z9049 Acquired absence of other specified parts of digestive tract: Secondary | ICD-10-CM

## 2019-04-30 DIAGNOSIS — J449 Chronic obstructive pulmonary disease, unspecified: Secondary | ICD-10-CM | POA: Diagnosis present

## 2019-04-30 DIAGNOSIS — F418 Other specified anxiety disorders: Secondary | ICD-10-CM | POA: Diagnosis present

## 2019-04-30 DIAGNOSIS — Z833 Family history of diabetes mellitus: Secondary | ICD-10-CM

## 2019-04-30 DIAGNOSIS — I959 Hypotension, unspecified: Secondary | ICD-10-CM

## 2019-04-30 DIAGNOSIS — I5032 Chronic diastolic (congestive) heart failure: Secondary | ICD-10-CM | POA: Diagnosis present

## 2019-04-30 DIAGNOSIS — I110 Hypertensive heart disease with heart failure: Secondary | ICD-10-CM | POA: Diagnosis present

## 2019-04-30 DIAGNOSIS — E876 Hypokalemia: Secondary | ICD-10-CM | POA: Diagnosis present

## 2019-04-30 DIAGNOSIS — G4733 Obstructive sleep apnea (adult) (pediatric): Secondary | ICD-10-CM | POA: Diagnosis present

## 2019-04-30 DIAGNOSIS — Z20828 Contact with and (suspected) exposure to other viral communicable diseases: Secondary | ICD-10-CM | POA: Diagnosis present

## 2019-04-30 DIAGNOSIS — R571 Hypovolemic shock: Secondary | ICD-10-CM | POA: Diagnosis present

## 2019-04-30 DIAGNOSIS — Z8673 Personal history of transient ischemic attack (TIA), and cerebral infarction without residual deficits: Secondary | ICD-10-CM | POA: Diagnosis not present

## 2019-04-30 DIAGNOSIS — K219 Gastro-esophageal reflux disease without esophagitis: Secondary | ICD-10-CM | POA: Diagnosis present

## 2019-04-30 DIAGNOSIS — K52831 Collagenous colitis: Secondary | ICD-10-CM | POA: Diagnosis present

## 2019-04-30 DIAGNOSIS — A419 Sepsis, unspecified organism: Secondary | ICD-10-CM | POA: Diagnosis present

## 2019-04-30 DIAGNOSIS — Z882 Allergy status to sulfonamides status: Secondary | ICD-10-CM

## 2019-04-30 DIAGNOSIS — Z8349 Family history of other endocrine, nutritional and metabolic diseases: Secondary | ICD-10-CM

## 2019-04-30 DIAGNOSIS — Z803 Family history of malignant neoplasm of breast: Secondary | ICD-10-CM

## 2019-04-30 DIAGNOSIS — Z90710 Acquired absence of both cervix and uterus: Secondary | ICD-10-CM

## 2019-04-30 LAB — URINALYSIS, COMPLETE (UACMP) WITH MICROSCOPIC
Bacteria, UA: NONE SEEN
Bilirubin Urine: NEGATIVE
Glucose, UA: NEGATIVE mg/dL
Hgb urine dipstick: NEGATIVE
Ketones, ur: NEGATIVE mg/dL
Leukocytes,Ua: NEGATIVE
Nitrite: NEGATIVE
Protein, ur: 30 mg/dL — AB
Specific Gravity, Urine: 1.021 (ref 1.005–1.030)
pH: 5 (ref 5.0–8.0)

## 2019-04-30 LAB — CBC WITH DIFFERENTIAL/PLATELET
Abs Immature Granulocytes: 0.05 10*3/uL (ref 0.00–0.07)
Basophils Absolute: 0 10*3/uL (ref 0.0–0.1)
Basophils Relative: 0 %
Eosinophils Absolute: 0.1 10*3/uL (ref 0.0–0.5)
Eosinophils Relative: 1 %
HCT: 39.4 % (ref 36.0–46.0)
Hemoglobin: 13.2 g/dL (ref 12.0–15.0)
Immature Granulocytes: 1 %
Lymphocytes Relative: 12 %
Lymphs Abs: 0.8 10*3/uL (ref 0.7–4.0)
MCH: 30.6 pg (ref 26.0–34.0)
MCHC: 33.5 g/dL (ref 30.0–36.0)
MCV: 91.4 fL (ref 80.0–100.0)
Monocytes Absolute: 0.7 10*3/uL (ref 0.1–1.0)
Monocytes Relative: 10 %
Neutro Abs: 5.3 10*3/uL (ref 1.7–7.7)
Neutrophils Relative %: 76 %
Platelets: 223 10*3/uL (ref 150–400)
RBC: 4.31 MIL/uL (ref 3.87–5.11)
RDW: 13.1 % (ref 11.5–15.5)
WBC: 7 10*3/uL (ref 4.0–10.5)
nRBC: 0 % (ref 0.0–0.2)

## 2019-04-30 LAB — CBC
HCT: 34.1 % — ABNORMAL LOW (ref 36.0–46.0)
Hemoglobin: 11.4 g/dL — ABNORMAL LOW (ref 12.0–15.0)
MCH: 30.6 pg (ref 26.0–34.0)
MCHC: 33.4 g/dL (ref 30.0–36.0)
MCV: 91.7 fL (ref 80.0–100.0)
Platelets: 196 10*3/uL (ref 150–400)
RBC: 3.72 MIL/uL — ABNORMAL LOW (ref 3.87–5.11)
RDW: 13.1 % (ref 11.5–15.5)
WBC: 6.5 10*3/uL (ref 4.0–10.5)
nRBC: 0 % (ref 0.0–0.2)

## 2019-04-30 LAB — GASTROINTESTINAL PANEL BY PCR, STOOL (REPLACES STOOL CULTURE)

## 2019-04-30 LAB — TROPONIN I (HIGH SENSITIVITY)
Troponin I (High Sensitivity): 11 ng/L (ref <18)
Troponin I (High Sensitivity): 14 ng/L (ref <18)

## 2019-04-30 LAB — CREATININE, SERUM
Creatinine, Ser: 1.6 mg/dL — ABNORMAL HIGH (ref 0.44–1.00)
GFR calc Af Amer: 37 mL/min — ABNORMAL LOW (ref >60)
GFR calc non Af Amer: 32 mL/min — ABNORMAL LOW (ref >60)

## 2019-04-30 LAB — LACTIC ACID, PLASMA
Lactic Acid, Venous: 2.2 mmol/L (ref 0.5–1.9)
Lactic Acid, Venous: 1 mmol/L (ref 0.5–1.9)

## 2019-04-30 LAB — PROTIME-INR
INR: 1 (ref 0.8–1.2)
Prothrombin Time: 13.5 seconds (ref 11.4–15.2)

## 2019-04-30 LAB — COMPREHENSIVE METABOLIC PANEL
ALT: 24 U/L (ref 0–44)
AST: 18 U/L (ref 15–41)
Albumin: 2.6 g/dL — ABNORMAL LOW (ref 3.5–5.0)
Alkaline Phosphatase: 80 U/L (ref 38–126)
Anion gap: 12 (ref 5–15)
BUN: 26 mg/dL — ABNORMAL HIGH (ref 8–23)
CO2: 21 mmol/L — ABNORMAL LOW (ref 22–32)
Calcium: 9.3 mg/dL (ref 8.9–10.3)
Chloride: 97 mmol/L — ABNORMAL LOW (ref 98–111)
Creatinine, Ser: 1.76 mg/dL — ABNORMAL HIGH (ref 0.44–1.00)
GFR calc Af Amer: 33 mL/min — ABNORMAL LOW (ref >60)
GFR calc non Af Amer: 29 mL/min — ABNORMAL LOW (ref >60)
Glucose, Bld: 163 mg/dL — ABNORMAL HIGH (ref 70–99)
Potassium: 3.2 mmol/L — ABNORMAL LOW (ref 3.5–5.1)
Sodium: 130 mmol/L — ABNORMAL LOW (ref 135–145)
Total Bilirubin: 0.8 mg/dL (ref 0.3–1.2)
Total Protein: 6.5 g/dL (ref 6.5–8.1)

## 2019-04-30 LAB — SARS CORONAVIRUS 2 BY RT PCR (HOSPITAL ORDER, PERFORMED IN ~~LOC~~ HOSPITAL LAB): SARS Coronavirus 2: NEGATIVE

## 2019-04-30 LAB — GLUCOSE, CAPILLARY: Glucose-Capillary: 145 mg/dL — ABNORMAL HIGH (ref 70–99)

## 2019-04-30 LAB — MRSA PCR SCREENING: MRSA by PCR: NEGATIVE

## 2019-04-30 LAB — LIPASE, BLOOD: Lipase: 18 U/L (ref 11–51)

## 2019-04-30 MED ORDER — ALPRAZOLAM 0.5 MG PO TABS
0.5000 mg | ORAL_TABLET | Freq: Every day | ORAL | Status: DC
  Administered 2019-04-30 – 2019-05-04 (×5): 0.5 mg via ORAL
  Filled 2019-04-30 (×5): qty 1

## 2019-04-30 MED ORDER — SODIUM CHLORIDE 0.9 % IV BOLUS
1000.0000 mL | Freq: Once | INTRAVENOUS | Status: AC
  Administered 2019-04-30: 1000 mL via INTRAVENOUS

## 2019-04-30 MED ORDER — CHLORHEXIDINE GLUCONATE CLOTH 2 % EX PADS
6.0000 | MEDICATED_PAD | Freq: Every day | CUTANEOUS | Status: DC
  Administered 2019-04-30 – 2019-05-03 (×4): 6 via TOPICAL

## 2019-04-30 MED ORDER — PRAZOSIN HCL 1 MG PO CAPS
1.0000 mg | ORAL_CAPSULE | Freq: Every day | ORAL | Status: DC
  Administered 2019-04-30 – 2019-05-04 (×5): 1 mg via ORAL
  Filled 2019-04-30 (×6): qty 1

## 2019-04-30 MED ORDER — ONDANSETRON HCL 4 MG/2ML IJ SOLN: 4.0000 mg | Freq: Four times a day (QID) | INTRAMUSCULAR | Status: DC | PRN

## 2019-04-30 MED ORDER — DILTIAZEM HCL ER 180 MG PO CP24: 180.0000 mg | ORAL_CAPSULE | Freq: Every day | ORAL | Status: DC

## 2019-04-30 MED ORDER — SODIUM CHLORIDE 0.9 % IV BOLUS (SEPSIS)
1000.0000 mL | Freq: Once | INTRAVENOUS | Status: AC
  Administered 2019-04-30: 1000 mL via INTRAVENOUS

## 2019-04-30 MED ORDER — SODIUM CHLORIDE 0.9 % IV BOLUS: 1000.0000 mL | Freq: Once | INTRAVENOUS | Status: DC

## 2019-04-30 MED ORDER — ACETAMINOPHEN 325 MG PO TABS
650.0000 mg | ORAL_TABLET | Freq: Four times a day (QID) | ORAL | Status: DC | PRN
  Administered 2019-05-02: 650 mg via ORAL
  Filled 2019-04-30: qty 2

## 2019-04-30 MED ORDER — ASPIRIN EC 81 MG PO TBEC
81.0000 mg | DELAYED_RELEASE_TABLET | Freq: Every day | ORAL | Status: DC
  Administered 2019-04-30: 81 mg via ORAL
  Filled 2019-04-30: qty 1

## 2019-04-30 MED ORDER — METHYLPREDNISOLONE SODIUM SUCC 125 MG IJ SOLR
60.0000 mg | Freq: Every day | INTRAMUSCULAR | Status: DC
  Administered 2019-04-30 – 2019-05-03 (×4): 60 mg via INTRAVENOUS
  Filled 2019-04-30 (×4): qty 2

## 2019-04-30 MED ORDER — SERTRALINE HCL 50 MG PO TABS
150.0000 mg | ORAL_TABLET | Freq: Every day | ORAL | Status: DC
  Administered 2019-04-30 – 2019-05-02 (×3): 150 mg via ORAL
  Filled 2019-04-30 (×4): qty 3

## 2019-04-30 MED ORDER — DOCUSATE SODIUM 100 MG PO CAPS: 100.0000 mg | ORAL_CAPSULE | Freq: Two times a day (BID) | ORAL | Status: DC

## 2019-04-30 MED ORDER — SODIUM CHLORIDE 0.9 % IV SOLN
INTRAVENOUS | Status: DC
  Administered 2019-04-30 – 2019-05-01 (×2): via INTRAVENOUS

## 2019-04-30 MED ORDER — PIPERACILLIN-TAZOBACTAM 3.375 G IVPB
3.3750 g | Freq: Three times a day (TID) | INTRAVENOUS | Status: DC
  Administered 2019-04-30: 3.375 g via INTRAVENOUS
  Filled 2019-04-30: qty 50

## 2019-04-30 MED ORDER — MEMANTINE HCL 5 MG PO TABS
5.0000 mg | ORAL_TABLET | Freq: Two times a day (BID) | ORAL | Status: DC
  Administered 2019-04-30 – 2019-05-05 (×11): 5 mg via ORAL
  Filled 2019-04-30 (×12): qty 1

## 2019-04-30 MED ORDER — PIPERACILLIN-TAZOBACTAM 3.375 G IVPB 30 MIN
3.3750 g | Freq: Once | INTRAVENOUS | Status: AC
  Administered 2019-04-30: 06:00:00 3.375 g via INTRAVENOUS
  Filled 2019-04-30: qty 50

## 2019-04-30 MED ORDER — ONDANSETRON HCL 4 MG PO TABS: 4.0000 mg | ORAL_TABLET | Freq: Four times a day (QID) | ORAL | Status: DC | PRN

## 2019-04-30 MED ORDER — HEPARIN SODIUM (PORCINE) 5000 UNIT/ML IJ SOLN
5000.0000 [IU] | Freq: Three times a day (TID) | INTRAMUSCULAR | Status: AC
  Administered 2019-04-30 – 2019-05-03 (×10): 5000 [IU] via SUBCUTANEOUS
  Filled 2019-04-30 (×9): qty 1

## 2019-04-30 MED ORDER — BUDESONIDE 3 MG PO CPEP
9.0000 mg | ORAL_CAPSULE | Freq: Every day | ORAL | Status: DC
  Administered 2019-04-30: 9 mg via ORAL
  Filled 2019-04-30: qty 3

## 2019-04-30 MED ORDER — ROSUVASTATIN CALCIUM 20 MG PO TABS
40.0000 mg | ORAL_TABLET | Freq: Every day | ORAL | Status: DC
  Administered 2019-04-30 – 2019-05-04 (×5): 40 mg via ORAL
  Filled 2019-04-30: qty 2
  Filled 2019-04-30: qty 4
  Filled 2019-04-30 (×2): qty 2
  Filled 2019-04-30 (×3): qty 4
  Filled 2019-04-30 (×2): qty 2

## 2019-04-30 MED ORDER — METOPROLOL TARTRATE 50 MG PO TABS
50.0000 mg | ORAL_TABLET | Freq: Two times a day (BID) | ORAL | Status: DC
  Administered 2019-04-30 – 2019-05-05 (×9): 50 mg via ORAL
  Filled 2019-04-30 (×10): qty 1

## 2019-04-30 MED ORDER — ACETAMINOPHEN 650 MG RE SUPP: 650.0000 mg | Freq: Four times a day (QID) | RECTAL | Status: DC | PRN

## 2019-04-30 NOTE — Progress Notes (Signed)
Shift summary: - Patient admitted with intractable diarrhea, hypotension. - VS stable

## 2019-04-30 NOTE — H&P (Signed)
[facility] [place] NAME: Caitlin Leonard    MR#:  [id number]  DATE OF BIRTH:  1946-09-30  DATE OF ADMISSION:  04/30/2019  PRIMARY CARE PHYSICIAN: Maryland Pink, MD   REQUESTING/REFERRING PHYSICIAN: Dr. Beather Arbour.  CHIEF COMPLAINT: Diarrhea   Chief Complaint  Patient presents with  . Abdominal Pain    HISTORY OF PRESENT ILLNESS:  Caitlin Leonard  is a 73 y.o. female with a known history of recently diagnosed collagenous colitis, having diarrhea for 4 weeks now getting worse and she has constant diarrhea, unable to keep anything down.  Complains of lower abdominal pain recently.  Patient was seen in the emergency room 3 days ago, at that time she had a CT of abdomen which was reassuring and discharged back to [facility] but comes back again today because of persistent diarrhea and she is feeling very weak.  Patient was admitted recently from July' July 5 for same problem and found to have collagenous colitis by colonoscopy that was done recently.  Patient has large volume diarrhea is going on for 4 weeks.  In the ER patient is hypotensive, so far received about 1 and half liters of IV fluids, blood work showed elevated lactic acid, hypokalemia, acute kidney injury patient is still hypotensive.  Tried Imodium without any relief.  Discharge last time with budesonide 9 mg daily, scheduled Imodium but patient says that she has profuse diarrhea without symptom relief even though she is taking budesonide and also Imodium.  Because of hypotension, tachycardia patient will be admitted to stepdown for close monitoring, will start the pressors.  Patient also needs rectal tube as she has continuous diarrhea.  Due to persistent diarrhea for 4 weeks she is not eating much and lost about 20 pounds since 4 weeks.  PAST MEDICAL HISTORY:   Past Medical History:  Diagnosis Date  . Anemia   . Anxiety   . Arthritis    osteoarthritis  . Asthma    due to seasonal  alllergies  . Atrial fibrillation ([place])   . Carpal tunnel syndrome   . Cataract   . Cervicalgia   . Collagenous colitis 2010  . COPD (chronic obstructive pulmonary disease) ([place])   . Depression    situational  . Diabetes mellitus without complication ([place])   . Dysrhythmia   . GERD (gastroesophageal reflux disease)   . GI bleed   . Headache(784.0)    migraines; 2 x month  . Heart murmur    MVP ( symptomatic)  . History of IBS   . Hyperlipemia   . Hypertension    on medication x 10 years  . Migraine   . MVP (mitral valve prolapse)   . Shortness of breath    exertional  . Sinoatrial node dysfunction (H[facility])   . Sleep apnea 2007   does not use CPAP since 40 # wt loss  . Stroke ([place])   . Syncopal episodes     PAST SURGICAL HISTOIRY:   Past Surgical History:  Procedure Laterality Date  . ABDOMINAL HYSTERECTOMY  1979  . BACK SURGERY    . BREAST CYST ASPIRATION Right 25 plus yrs ago  . CARDIAC CATHETERIZATION  2012   [facility]  . CHOLECYSTECTOMY  2011  . COLONOSCOPY WITH PROPOFOL N/A 03/25/2019   Procedure: COLONOSCOPY WITH PROPOFOL;  Surgeon: Lollie Sails, MD;  Location: [facility] ENDOSCOPY;  Service: Endoscopy;  Laterality: N/A;  . ESOPHAGOGASTRODUODENOSCOPY (EGD) WITH PROPOFOL N/A 03/25/2019   Procedure: ESOPHAGOGASTRODUODENOSCOPY (  EGD) WITH PROPOFOL;  Surgeon: Lollie Sails, MD;  Location: [facility] ENDOSCOPY;  Service: Endoscopy;  Laterality: N/A;  . FLEXIBLE BRONCHOSCOPY N/A 10/10/2016   Procedure: FLEXIBLE BRONCHOSCOPY;  Surgeon: [name], MD;  Location: [facility] ORS;  Service: Pulmonary;  Laterality: N/A;  . FLEXIBLE BRONCHOSCOPY N/A 12/11/2017   Procedure: FLEXIBLE BRONCHOSCOPY;  Surgeon: [name], MD;  Location: [facility] ORS;  Service: Pulmonary;  Laterality: N/A;  . [place], 2012 x 2   bilateral  . LUMBAR LAMINECTOMY  5027,7412  . NASAL SINUS SURGERY  1994  . TONSILLECTOMY    . TOTAL KNEE ARTHROPLASTY  12/22/2011    Procedure: TOTAL KNEE ARTHROPLASTY;  Surgeon: Rudean Haskell, MD;  Location: [place];  Service: Orthopedics;  Laterality: Left;    SOCIAL HISTORY:   Social History   Tobacco Use  . Smoking status: Never Smoker  . Smokeless tobacco: Never Used  Substance Use Topics  . Alcohol use: Yes    Alcohol/week: 1.0 standard drinks    Types: 1 Glasses of wine per week    Comment: occasional wine - less than once a month    FAMILY HISTORY:   Family History  Problem Relation Age of Onset  . Hypertension Mother   . Hyperlipidemia Mother   . Diabetes Mother   . Hypertension Father   . Hyperlipidemia Father   . Arrhythmia Father        A-Fib  . Diabetes Paternal Uncle   . Diabetes Paternal Grandfather   . Breast cancer Maternal Aunt        great in late 49's  . Anesthesia problems Neg Hx   . Colon cancer Neg Hx   . Stomach cancer Neg Hx   . Rectal cancer Neg Hx   . Liver cancer Neg Hx   . Esophageal cancer Neg Hx     DRUG ALLERGIES:   Allergies  Allergen Reactions  . Contrast Media [Iodinated Diagnostic Agents] Anaphylaxis  . Gadolinium Derivatives Anaphylaxis  . Gadoversetamide Anaphylaxis  . Shellfish Allergy Nausea And Vomiting and Other (See Comments)    Can eat shrimp but not oysters Dizziness,severe nausea and vomiting ( can eat shrimp CAN NOT EAT ORYSTERS) Other reaction(s): Other (See Comments) Dizziness,severe nausea and vomiting ( can eat shrimp CAN NOT EAT ORYSTERS)  Can eat shrimp but not oysters Dizziness,severe nausea and vomiting ( can eat shrimp CAN NOT EAT ORYSTERS)  Can eat shrimp but not oysters   . Sulfa Antibiotics Hives and Other (See Comments)    GI upset GI upset GI upset  GI upset GI upset GI upset Other reaction(s): Other (See Comments) GI upset GI upset  GI upset GI upset   . Hydroxychloroquine Sulfate Hives and Other (See Comments)    Severe hives, all over like chicken pox.  . Banana Nausea And Vomiting and Other (See Comments)     Raw bananas  . Limonene Other (See Comments)    GI upset GI upset   . Morphine And Related   . Nabumetone Other (See Comments)    Hypertension  . Otezla [Apremilast]   . Potassium-Containing Compounds Other (See Comments)    GI upset  . Prednisone Nausea And Vomiting  . Sulfasalazine Hives and Other (See Comments)    GI upset  . Sulfonamide Derivatives Hives  . Metronidazole Rash  . Sulfamethoxazole Rash    REVIEW OF SYSTEMS:  CONSTITUTIONAL: Generalized weakness. EYES: No blurred or double vision.  EARS, NOSE, AND THROAT: No tinnitus or ear pain.  RESPIRATORY: No cough, shortness of breath, wheezing or hemoptysis.  CARDIOVASCULAR: No chest pain, orthopnea, edema.  GASTROINTESTINAL: nausea, diarrhea, poor p.o. intake gENITOURINARY: No dysuria, hematuria.  ENDOCRINE: No polyuria, nocturia,  HEMATOLOGY: No anemia, easy bruising or bleeding SKIN: No rash or lesion. MUSCULOSKELETAL: No joint pain or arthritis.   NEUROLOGIC: No tingling, numbness, weakness.  PSYCHIATRY: Anxiety.   MEDICATIONS AT HOME:   Prior to Admission medications   Medication Sig Start Date End Date Taking? Authorizing Provider  albuterol (PROVENTIL HFA;VENTOLIN HFA) 108 (90 Base) MCG/ACT inhaler Inhale 1-2 puffs into the lungs every 6 (six) hours as needed for wheezing or shortness of breath. 06/08/18   [name], MD  ALPRAZolam Duanne Moron) 0.5 MG tablet Take 0.5 mg by mouth at bedtime.     [provider]  aspirin EC 81 MG tablet Take 81 mg by mouth daily.    [provider]  Biotin 1000 MCG tablet Take 1,000 mcg by mouth 3 (three) times daily.    [provider]  budesonide (ENTOCORT EC) 3 MG 24 hr capsule Take 3 capsules (9 mg total) by mouth daily. 04/11/19   Stark Jock Jude, MD  carboxymethylcellulose (REFRESH PLUS) 0.5 % SOLN Place 1 drop into both eyes 2 (two) times daily.     [provider]  chlorpheniramine-HYDROcodone (TUSSIONEX PENNKINETIC ER) 10-8 MG/5ML SUER Take  5 mLs by mouth at bedtime as needed for cough. 02/07/19   [name], MD  diclofenac (VOLTAREN) 75 MG EC tablet Take 75 mg by mouth 2 (two) times daily.    [provider]  diltiazem (DILACOR XR) 180 MG 24 hr capsule Take 180 mg by mouth daily.    [provider]  EPIPEN 2-PAK 0.3 MG/0.3ML SOAJ injection Inject 0.3 mg into the muscle once.     [provider]  glucose blood test strip 1 each by Other route as needed for other. Use as instructed    [provider]  hydrochlorothiazide (HYDRODIURIL) 12.5 MG tablet Take 12.5 mg by mouth daily. 03/15/18   [provider]  hyoscyamine (LEVSIN) 0.125 MG tablet Take 0.125 mg by mouth every 4 (four) hours as needed.    [provider]  Ibuprofen-diphenhydrAMINE Cit (ADVIL PM) 200-38 MG TABS Take 3 tablets by mouth at bedtime.    [provider]  loperamide (IMODIUM) 2 MG capsule Take 1 capsule (2 mg total) by mouth 4 (four) times daily. 04/10/19   Stark Jock Jude, MD  Melatonin 5 MG TABS Take 5 mg by mouth at bedtime.    [provider]  memantine (NAMENDA) 5 MG tablet Take 5 mg by mouth 2 (two) times daily. 03/13/19 03/12/20  [provider]  mesalamine (LIALDA) 1.2 g EC tablet Take 4.8 g by mouth daily with breakfast. 04/04/19 05/04/19  [provider]  metoprolol tartrate (LOPRESSOR) 50 MG tablet TAKE 1 TABLET(50 MG) BY MOUTH TWICE DAILY 11/24/18   [name], MD  Omalizumab Arvid Right) 150 MG/ML SOSY Inject into the skin.    [provider]  ondansetron (ZOFRAN-ODT) 4 MG disintegrating tablet Take 1 tablet (4 mg total) by mouth every 8 (eight) hours as needed for nausea or vomiting. 04/27/19   Merlyn Lot, MD  potassium chloride (K-DUR) 10 MEQ tablet Take 10 mEq by mouth 2 (two) times daily. 03/30/19 03/29/20  [provider]  prazosin (MINIPRESS) 1 MG capsule Take 1 mg by mouth at bedtime. 03/11/19 03/10/20  [provider]  RABEprazole (ACIPHEX)  20 MG tablet Take 20 mg by mouth daily. 71mnutes prior to a meal. 03/25/19   [provider]  rosuvastatin (CRESTOR) 40 MG tablet Take 1 tablet (40 mg total) by mouth daily. 11/11/16 04/06/19  GMinna Merritts MD  sertraline (ZOLOFT) 100 MG tablet Take 150 mg by mouth daily.     [provider]  sucralfate (CARAFATE) 1 g tablet Take 1 g by mouth 2 (two) times daily. 03/25/19   [provider]  vitamin B-12 (CYANOCOBALAMIN) 1000 MCG tablet Take 1,000 mcg by mouth daily.    [provider]  Vitamin D, Ergocalciferol, (DRISDOL) 50000 units CAPS capsule Take 50,000 Units by mouth every Saturday.  10/08/16   [provider]  vitamin E 600 UNIT capsule Take 600 Units by mouth 2 (two) times daily.    [provider]      VITAL SIGNS:  Blood pressure (!) 83/37, pulse (!) 105, temperature (!) 97.3 F (36.3 C), temperature source Rectal, resp. rate (!) 21, height _0  (1.702 m), weight 112.5 kg, SpO2 95 %.  PHYSICAL EXAMINATION:  GENERAL:  73y.o.-year-old patient lying in the bed with distress secondary to diarrhea and she appears very dry eYES: Pupils equal, round, reactive to light. No scleral icterus. Extraocular muscles intact.  HEENT: Head atraumatic, normocephalic. Oropharynx and nasopharynx clear.  NECK:  Supple, no jugular venous distention. No thyroid enlargement, no tenderness.  LUNGS: Normal breath sounds bilaterally, no wheezing, rales,rhonchi or crepitation. No use of accessory muscles of respiration.  CARDIOVASCULAR: S1, S2 normal. No murmurs, rubs, or gallops.  ABDOMEN: S lower quadrant abdominal tenderness present, bowel sounds present  eXTREMITIES: No pedal edema, cyanosis, or clubbing.  NEUROLOGIC: Cranial nerves II through XII are intact. Muscle strength 5/5 in all extremities. Sensation intact. Gait not checked.  PSYCHIATRIC: The patient is alert and oriented x 3.  SKIN: No obvious rash, lesion, or ulcer.   LABORATORY PANEL:    CBC Recent Labs  Lab 04/30/19 0542  WBC 7.0  HGB 13.2  HCT 39.4  PLT 223   ------------------------------------------------------------------------------------------------------------------  Chemistries  Recent Labs  Lab 04/30/19 0542  NA 130*  K 3.2*  CL 97*  CO2 21*  GLUCOSE 163*  BUN 26*  CREATININE 1.76*  CALCIUM 9.3  AST 18  ALT 24  ALKPHOS 80  BILITOT 0.8   ------------------------------------------------------------------------------------------------------------------  Cardiac Enzymes No results for input(s): TROPONINI in the last 168 hours. ------------------------------------------------------------------------------------------------------------------  RADIOLOGY:  Dg Chest Port 1 View  Result Date: 04/30/2019 CLINICAL DATA:  Hypotension EXAM: PORTABLE CHEST 1 VIEW COMPARISON:  None. FINDINGS: The heart size and mediastinal contours are within normal limits. Both lungs are clear. The visualized skeletal structures are unremarkable. IMPRESSION: No active disease. Electronically Signed   By: KUlyses JarredM.D.   On: 04/30/2019 06:21    EKG:   Orders placed or performed during the hospital encounter of 04/30/19  . EKG 12-Lead  . EKG 12-Lead    IMPRESSION AND PLAN:  73year old female with history of recently diagnosed collagenous colitis, persistent diarrhea for 4 weeks now getting worse despite recently starting patient on steroids, patient had no improvement with Imodium.  Stool for C. difficile is negative at this time, GI PCR is pending.  Sepsis present on admission with elevated lactic acid, hypotension with acute kidney injury, she is directly due to colitis, she received Zosyn in the ER, continue Zosyn, start IV steroids, obtain gastroenterology consult because of colitis, recent  diagnosis of collagenous colitis. 2.  Septic shock, patient still hypotensive despite 2 L of fluid given in the emergency room, start pressors, continue gentle hydration,  follow blood cultures, stool cultures. 3.  Proximal defibrillation, patient is on Cardizem, beta-blockers, unable to give dose because of hypotension, admitted to stepdown will obtain cardiology consult.  Tachycardia likely secondary to dehydration. 4.  Anxiety patient is on Xanax, continue that.  #5 history of sleep apnea, CPAP at night.  All the records are reviewed and case discussed with ED provider. Management plans discussed with the patient, family and they are in agreement.  CODE STATUS: DNR(discussed with patient  TOTAL TIME TAKING CARE OF THIS PATIENT: 55 minutes.    Epifanio Lesches M.D on 04/30/2019 at 7:40 AM  Between 7am to 6pm - Pager - [phone]  After 6pm go to [email or web address] - password [email or web address] [facility]  Office  [phone]  CC: Primary care physician; Maryland Pink, MD  Note: This dictation was prepared with Dragon dictation along with smaller phrase technology. Any transcriptional errors that result from this process are unintentional.

## 2019-04-30 NOTE — ED Notes (Signed)
Ena Dawley, rn in to attempt US guided iv insertion.

## 2019-04-30 NOTE — ED Triage Notes (Signed)
Pt states 4 weeks of abd pain, n/v/d and fever. Pt with noted hypotension, ems gave 354mL ns bolus. Pt states she has lost 22 lbs since she has been sick.

## 2019-04-30 NOTE — Progress Notes (Signed)
CODE SEPSIS - PHARMACY COMMUNICATION  **Broad Spectrum Antibiotics should be administered within 1 hour of Sepsis diagnosis**  Time Code Sepsis Called/Page Received: @ 0645  Antibiotics Ordered: Zosyn  Time of 1st antibiotic administration: @ [phone]  Additional action taken by pharmacy: N/A  If necessary, Name of Provider/Nurse Contacted: N/A   Pernell Dupre, PharmD, BCPS Clinical Pharmacist 04/30/2019 6:47 AM

## 2019-04-30 NOTE — ED Notes (Signed)
Report to hunter, rn.  

## 2019-04-30 NOTE — ED Notes (Signed)
MD at bedside. 

## 2019-04-30 NOTE — ED Notes (Signed)
Called Husband, No answer. Left VM for husband to call to receive update. Pending bed assignment to ICU.

## 2019-04-30 NOTE — ED Notes (Signed)
Unable to give report at this time. Will call back to give report.

## 2019-04-30 NOTE — Progress Notes (Addendum)
Notified provider of need to order fluid bolus.  Pt needs full fluid resuscitation based on multiple sbp less than 90. Currently pt has only had 2046ml and requires 334ml. I have sent the admitting MD a message asking for the consideration of ordering additional fluids. MD responded back that the patient needs one more bolus.  [place]link RN also sent bedside RN a noted to inform them I had reached out to MD asking for more fluids as patient is still hypotensive.

## 2019-04-30 NOTE — ED Notes (Signed)
ED TO INPATIENT HANDOFF REPORT  ED Nurse Name and Phone #: Nira Conn [phone]  S Name/Age/Gender [name] 73 y.o. female Room/Bed: ED24A/ED24A  Code Status   Code Status: Full Code  Home/SNF/Other Skilled nursing facility Patient oriented to: self, place, time and situation Is this baseline? Yes   Triage Complete: Triage complete  Chief Complaint N/V/D  Triage Note Pt states 4 weeks of abd pain, n/v/d and fever. Pt with noted hypotension, ems gave 329m ns bolus. Pt states she has lost 22 lbs since she has been sick.    Allergies Allergies  Allergen Reactions  . Contrast Media [Iodinated Diagnostic Agents] Anaphylaxis  . Gadolinium Derivatives Anaphylaxis  . Gadoversetamide Anaphylaxis  . Shellfish Allergy Nausea And Vomiting and Other (See Comments)    Can eat shrimp but not oysters Dizziness,severe nausea and vomiting ( can eat shrimp CAN NOT EAT ORYSTERS) Other reaction(s): Other (See Comments) Dizziness,severe nausea and vomiting ( can eat shrimp CAN NOT EAT ORYSTERS)  Can eat shrimp but not oysters Dizziness,severe nausea and vomiting ( can eat shrimp CAN NOT EAT ORYSTERS)  Can eat shrimp but not oysters   . Sulfa Antibiotics Hives and Other (See Comments)    GI upset GI upset GI upset  GI upset GI upset GI upset Other reaction(s): Other (See Comments) GI upset GI upset  GI upset GI upset   . Hydroxychloroquine Sulfate Hives and Other (See Comments)    Severe hives, all over like chicken pox.  . Banana Nausea And Vomiting and Other (See Comments)    Raw bananas  . Limonene Other (See Comments)    GI upset GI upset   . Morphine And Related   . Nabumetone Other (See Comments)    Hypertension  . Otezla [Apremilast]   . Potassium-Containing Compounds Other (See Comments)    GI upset  . Prednisone Nausea And Vomiting  . Sulfasalazine Hives and Other (See Comments)    GI upset  . Sulfonamide Derivatives Hives  . Metronidazole Rash  .  Sulfamethoxazole Rash    Level of Care/Admitting Diagnosis ED Disposition    [id number]  Level of Care: Stepdown [14]  Covid Evaluation: Confirmed COVID Negative  Diagnosis: Septic shock ([facility] [[[id number]] Admitting Physician: KEpifanio Lesches[[[id number]] Attending Physician: KEpifanio Lesches[[id number]]  Estimated length of stay: past midnight tomorrow  Certification:: I certify this patient will need inpatient services for at least 2 midnights  PT Class (Do Not Modify): Inpatient [101]  PT Acc Code (Do Not Modify): Private [1]       B Medical/Surgery History Past Medical History:  Diagnosis Date  . Anemia   . Anxiety   . Arthritis    osteoarthritis  . Asthma    due to seasonal alllergies  . Atrial fibrillation ([facility]   . Carpal tunnel syndrome   . Cataract   . Cervicalgia   . Collagenous colitis 2010  . COPD (chronic obstructive pulmonary disease) ([place] [place]   . Depression    situational  . Diabetes mellitus without complication ([facility]   . Dysrhythmia   . GERD (gastroesophageal reflux disease)   . GI bleed   . Headache(784.0)    migraines; 2 x month  . Heart murmur    MVP ( symptomatic)  . History of IBS   . Hyperlipemia   . Hypertension    on medication x 10 years  . Migraine   .  MVP (mitral valve prolapse)   . Shortness of breath    exertional  . Sinoatrial node dysfunction (HCC)   . Sleep apnea 2007   does not use CPAP since 40 # wt loss  . Stroke ([place])   . Syncopal episodes    Past Surgical History:  Procedure Laterality Date  . ABDOMINAL HYSTERECTOMY  1979  . BACK SURGERY    . BREAST CYST ASPIRATION Right 25 plus yrs ago  . CARDIAC CATHETERIZATION  2012   [facility]  . CHOLECYSTECTOMY  2011  . COLONOSCOPY WITH PROPOFOL N/A 03/25/2019   Procedure: COLONOSCOPY WITH PROPOFOL;  Surgeon: Lollie Sails, MD;  Location: [facility] ENDOSCOPY;  Service: Endoscopy;  Laterality:  N/A;  . ESOPHAGOGASTRODUODENOSCOPY (EGD) WITH PROPOFOL N/A 03/25/2019   Procedure: ESOPHAGOGASTRODUODENOSCOPY (EGD) WITH PROPOFOL;  Surgeon: Lollie Sails, MD;  Location: [facility] ENDOSCOPY;  Service: Endoscopy;  Laterality: N/A;  . FLEXIBLE BRONCHOSCOPY N/A 10/10/2016   Procedure: FLEXIBLE BRONCHOSCOPY;  Surgeon: [name], MD;  Location: [facility] ORS;  Service: Pulmonary;  Laterality: N/A;  . FLEXIBLE BRONCHOSCOPY N/A 12/11/2017   Procedure: FLEXIBLE BRONCHOSCOPY;  Surgeon: [name], MD;  Location: [facility] ORS;  Service: Pulmonary;  Laterality: N/A;  . [place], 2012 x 2   bilateral  . LUMBAR LAMINECTOMY  [id number]  . NASAL SINUS SURGERY  1994  . TONSILLECTOMY    . TOTAL KNEE ARTHROPLASTY  12/22/2011   Procedure: TOTAL KNEE ARTHROPLASTY;  Surgeon: Rudean Haskell, MD;  Location: [place];  Service: Orthopedics;  Laterality: Left;     A IV Location/Drains/Wounds Patient Lines/Drains/Airways Status   Active Line/Drains/Airways    Name:   Placement date:   Placement time:   Site:   Days:   Peripheral IV 04/27/19 Left Forearm   04/27/19    1820    Forearm   3   Peripheral IV 04/30/19 Left Antecubital   04/30/19    -    Antecubital   less than 1   Peripheral IV 04/30/19 Right Forearm   04/30/19    0635    Forearm   less than 1          Intake/Output Last 24 hours  Intake/Output Summary (Last 24 hours) at 04/30/2019 0745 Last data filed at 04/30/2019 1607 Gross per 24 hour  Intake 1050 ml  Output -  Net 1050 ml    Labs/Imaging Results for orders placed or performed during the hospital encounter of 04/30/19 (from the past 48 hour(s))  Comprehensive metabolic panel     Status: Abnormal   Collection Time: 04/30/19  5:42 AM  Result Value Ref Range   Sodium 130 (L) 135 - 145 mmol/L   Potassium 3.2 (L) 3.5 - 5.1 mmol/L   Chloride 97 (L) 98 - 111 mmol/L   CO2 21 (L) 22 - 32 mmol/L   Glucose, Bld 163 (H) 70 - 99 mg/dL   BUN 26 (H) 8 - 23  mg/dL   Creatinine, Ser 1.76 (H) 0.44 - 1.00 mg/dL   Calcium 9.3 8.9 - 10.3 mg/dL   Total Protein 6.5 6.5 - 8.1 g/dL   Albumin 2.6 (L) 3.5 - 5.0 g/dL   AST 18 15 - 41 U/L   ALT 24 0 - 44 U/L   Alkaline Phosphatase 80 38 - 126 U/L   Total Bilirubin 0.8 0.3 - 1.2 mg/dL   GFR calc non Af Amer 29 (L) >60 mL/min   GFR calc  Af Amer 33 (L) >60 mL/min   Anion gap 12 5 - 15    Comment: Performed at [facility], [address] [place], [place] [address]  Lactic acid, plasma     Status: Abnormal   Collection Time: 04/30/19  5:42 AM  Result Value Ref Range   Lactic Acid, Venous 2.2 (HH) 0.5 - 1.9 mmol/L    Comment: CRITICAL RESULT CALLED TO, READ BACK BY AND VERIFIED WITH APRIL BRUMGARD _0  ON 04/30/2019 BY FMW Performed at [facility], [address] [place], [place] [address]   CBC with Differential     Status: None   Collection Time: 04/30/19  5:42 AM  Result Value Ref Range   WBC 7.0 4.0 - 10.5 K/uL   RBC 4.31 3.87 - 5.11 MIL/uL   Hemoglobin 13.2 12.0 - 15.0 g/dL   HCT 39.4 36.0 - 46.0 %   MCV 91.4 80.0 - 100.0 fL   MCH 30.6 26.0 - 34.0 pg   MCHC 33.5 30.0 - 36.0 g/dL   RDW 13.1 11.5 - 15.5 %   Platelets 223 150 - 400 K/uL   nRBC 0.0 0.0 - 0.2 %   Neutrophils Relative % 76 %   Neutro Abs 5.3 1.7 - 7.7 K/uL   Lymphocytes Relative 12 %   Lymphs Abs 0.8 0.7 - 4.0 K/uL   Monocytes Relative 10 %   Monocytes Absolute 0.7 0.1 - 1.0 K/uL   Eosinophils Relative 1 %   Eosinophils Absolute 0.1 0.0 - 0.5 K/uL   Basophils Relative 0 %   Basophils Absolute 0.0 0.0 - 0.1 K/uL   Immature Granulocytes 1 %   Abs Immature Granulocytes 0.05 0.00 - 0.07 K/uL    Comment: Performed at [facility], [address] [place], [place] [address]  Protime-INR     Status: None   Collection Time: 04/30/19  5:42 AM  Result Value Ref Range   Prothrombin Time 13.5 11.4 - 15.2 seconds   INR 1.0 0.8 - 1.2    Comment: (NOTE) INR goal varies based on device and disease  states. Performed at [facility], [address] [place], [place] [address]   Lipase, blood     Status: None   Collection Time: 04/30/19  5:42 AM  Result Value Ref Range   Lipase 18 11 - 51 U/L    Comment: Performed at [facility], [place], [place] [address]  Troponin I (High Sensitivity)     Status: None   Collection Time: 04/30/19  5:42 AM  Result Value Ref Range   Troponin I (High Sensitivity) 11 <18 ng/L    Comment: (NOTE) Elevated high sensitivity troponin I (hsTnI) values and significant  changes across serial measurements may suggest ACS but many other  chronic and acute conditions are known to elevate hsTnI results.  Refer to the "Links" section for chest pain algorithms and additional  guidance. Performed at [facility], [address] [place], [place] [address]   Urinalysis, Complete w Microscopic     Status: Abnormal   Collection Time: 04/30/19  6:15 AM  Result Value Ref Range   Color, Urine AMBER (A) YELLOW    Comment: BIOCHEMICALS MAY BE AFFECTED BY COLOR   APPearance HAZY (A) CLEAR   Specific Gravity, Urine 1.021 1.005 - 1.030   pH 5.0 5.0 - 8.0   Glucose, UA NEGATIVE NEGATIVE mg/dL   Hgb urine dipstick NEGATIVE NEGATIVE   Bilirubin Urine NEGATIVE NEGATIVE  Ketones, ur NEGATIVE NEGATIVE mg/dL   Protein, ur 30 (A) NEGATIVE mg/dL   Nitrite NEGATIVE NEGATIVE   Leukocytes,Ua NEGATIVE NEGATIVE   WBC, UA 0-5 0 - 5 WBC/hpf   Bacteria, UA NONE SEEN NONE SEEN   Squamous Epithelial / LPF 0-5 0 - 5   Mucus PRESENT    Hyaline Casts, UA PRESENT     Comment: Performed at [facility], [address], [place], [place] [address]  SARS Coronavirus 2 (CEPHEID - Performed in [place] hospital lab), Hosp Order     Status: None   Collection Time: 04/30/19  6:15 AM   Specimen: Nasopharyngeal Swab  Result Value Ref Range   SARS Coronavirus 2 NEGATIVE NEGATIVE    Comment: (NOTE) If result is NEGATIVE SARS-CoV-2  target nucleic acids are NOT DETECTED. The SARS-CoV-2 RNA is generally detectable in upper and lower  respiratory specimens during the acute phase of infection. The lowest  concentration of SARS-CoV-2 viral copies this assay can detect is 250  copies / mL. A negative result does not preclude SARS-CoV-2 infection  and should not be used as the sole basis for treatment or other  patient management decisions.  A negative result may occur with  improper specimen collection / handling, submission of specimen other  than nasopharyngeal swab, presence of viral mutation(s) within the  areas targeted by this assay, and inadequate number of viral copies  (<250 copies / mL). A negative result must be combined with clinical  observations, patient history, and epidemiological information. If result is POSITIVE SARS-CoV-2 target nucleic acids are DETECTED. The SARS-CoV-2 RNA is generally detectable in upper and lower  respiratory specimens dur ing the acute phase of infection.  Positive  results are indicative of active infection with SARS-CoV-2.  Clinical  correlation with patient history and other diagnostic information is  necessary to determine patient infection status.  Positive results do  not rule out bacterial infection or co-infection with other viruses. If result is PRESUMPTIVE POSTIVE SARS-CoV-2 nucleic acids MAY BE PRESENT.   A presumptive positive result was obtained on the submitted specimen  and confirmed on repeat testing.  While 2019 novel coronavirus  (SARS-CoV-2) nucleic acids may be present in the submitted sample  additional confirmatory testing may be necessary for epidemiological  and / or clinical management purposes  to differentiate between  SARS-CoV-2 and other Sarbecovirus currently known to infect humans.  If clinically indicated additional testing with an alternate test  methodology [phone]) is advised. The SARS-CoV-2 RNA is generally  detectable in upper and lower  respiratory sp ecimens during the acute  phase of infection. The expected result is Negative. Fact Sheet for Patients:  StrictlyIdeas.no Fact Sheet for Healthcare Providers: BankingDealers.co.za This test is not yet approved or cleared by the [place] FDA and has been authorized for detection and/or diagnosis of SARS-CoV-2 by FDA under an Emergency Use Authorization (EUA).  This EUA will remain in effect (meaning this test can be used) for the duration of the COVID-19 declaration under Section 564(b)(1) of the Act, 21 U.S.C. section 360bbb-3(b)(1), unless the authorization is terminated or revoked sooner. Performed at [facility], [address] [place], [place] [address]    Dg Chest Port 1 View  Result Date: 04/30/2019 CLINICAL DATA:  Hypotension EXAM: PORTABLE CHEST 1 VIEW COMPARISON:  None. FINDINGS: The heart size and mediastinal contours are within normal limits. Both lungs are clear. The visualized skeletal structures are unremarkable. IMPRESSION: No active disease. Electronically Signed  By: Ulyses Jarred M.D.   On: 04/30/2019 06:21    Pending Labs Unresulted Labs (From admission, onward)    Start     Ordered   05/01/19 8016  Basic metabolic panel  Tomorrow morning,   STAT     04/30/19 0718   05/01/19 0500  CBC  Tomorrow morning,   STAT     04/30/19 0718   04/30/19 0713  CBC  (heparin)  Once,   STAT    Comments: Baseline for heparin therapy IF NOT ALREADY DRAWN.  Notify MD if PLT < 100 K.    04/30/19 0718   04/30/19 0713  Creatinine, serum  (heparin)  Once,   STAT    Comments: Baseline for heparin therapy IF NOT ALREADY DRAWN.    04/30/19 0718   04/30/19 0615  Gastrointestinal Panel by PCR , Stool  (Gastrointestinal Panel by PCR, Stool)  Once,   STAT     04/30/19 0614   04/30/19 0533  Lactic acid, plasma  Now then every 2 hours,   STAT     04/30/19 0532   04/30/19 0533  Culture, blood (Routine x 2)  BLOOD  CULTURE X 2,   STAT     04/30/19 0532          Vitals/Pain Today's Vitals   04/30/19 0700 04/30/19 0720 04/30/19 0730 04/30/19 0741  BP: (!) 85/46 100/63 (!) 83/37 (!) 102/44  Pulse: (!) 102  (!) 105 (!) 107  Resp: (!) 23 (!) 23 (!) 21 20  Temp:      TempSrc:      SpO2: 96%  95% 96%  Weight:      Height:      PainSc:        Isolation Precautions Enteric precautions (UV disinfection)  Medications Medications  aspirin EC tablet 81 mg (has no administration in time range)  metoprolol tartrate (LOPRESSOR) tablet 50 mg (has no administration in time range)  budesonide (ENTOCORT EC) 24 hr capsule 9 mg (has no administration in time range)  sertraline (ZOLOFT) tablet 150 mg (has no administration in time range)  memantine (NAMENDA) tablet 5 mg (has no administration in time range)  ALPRAZolam (XANAX) tablet 0.5 mg (has no administration in time range)  rosuvastatin (CRESTOR) tablet 40 mg (has no administration in time range)  prazosin (MINIPRESS) capsule 1 mg (has no administration in time range)  heparin injection 5,000 Units (has no administration in time range)  0.9 %  sodium chloride infusion (has no administration in time range)  acetaminophen (TYLENOL) tablet 650 mg (has no administration in time range)    Or  acetaminophen (TYLENOL) suppository 650 mg (has no administration in time range)  docusate sodium (COLACE) capsule 100 mg (has no administration in time range)  ondansetron (ZOFRAN) tablet 4 mg (has no administration in time range)    Or  ondansetron (ZOFRAN) injection 4 mg (has no administration in time range)  sodium chloride 0.9 % bolus 1,000 mL (0 mLs Intravenous Stopped 04/30/19 0650)  piperacillin-tazobactam (ZOSYN) IVPB 3.375 g (0 g Intravenous Stopped 04/30/19 0653)  sodium chloride 0.9 % bolus 1,000 mL (0 mLs Intravenous Stopped 04/30/19 0724)    Mobility walks Moderate fall risk   Focused Assessments .   R Recommendations: See Admitting Provider  Note  Report given to:   Additional Notes: .

## 2019-04-30 NOTE — ED Notes (Signed)
Pt with large amount of liquid feces in bed. Pt cleansed of stool. In and out urine performed for ua sample. Pt with very concentrated brown urine noted. Pt placed in trendelenberg position for hypotension. Ena Dawley, rn to attempt iv guided ultrasound per michele, rn after this rn and michele, rn attempt x4 without success.

## 2019-04-30 NOTE — ED Notes (Signed)
Husband returned phone call, update given. Denies needs at this time.

## 2019-04-30 NOTE — ED Notes (Signed)
Admitting Provider at bedside. 

## 2019-04-30 NOTE — Progress Notes (Signed)
Family Meeting Note  Advance Directive:yes  Today a meeting took place with the Patient.  Discussed with patient Patient has multiple medical problems with advanced age and now has colitis and ongoing diarrhea, she told me that she does not want to be resuscitated and wanted to be DNR, DNI.   Additional follow-up to be provided: yes Time spent during discussion:20 min  Epifanio Lesches, MD

## 2019-04-30 NOTE — Plan of Care (Signed)

## 2019-04-30 NOTE — ED Notes (Signed)
Critical lactic acid of 2.2 called from lab. Dr. Beather Arbour notified, no new verbal orders received.

## 2019-04-30 NOTE — ED Provider Notes (Signed)
[facility] Emergency Department Provider Note   ____________________________________________   First MD Initiated Contact with Patient 04/30/19 [phone]     (approximate)  I have reviewed the triage vital signs and the nursing notes.   HISTORY  Chief Complaint Abdominal Pain    HPI Caitlin Leonard is a 73 y.o. female brought to the ED via EMS from [facility] with persistent nausea, vomiting and diarrhea.  Patient was hospitalized at the beginning of the month for collagenous colitis.  Reports she has had persistent symptoms for 4 weeks, diarrhea> vomiting.  Recent ED visit on 7/22 with CT demonstrating liquid stool throughout the colon consistent with diarrheal illness, possible mild ascending colonic inflammation with wall thickening and pericolonic edema.  Denies fever, cough, chest pain or shortness of breath.  EMS reports hypotension at the scene, received 300 mL normal saline prior to arrival.  Patient reports unintentional 22 pound weight loss since she has been sick for the past month.  States she was tested for COVID negative 3 times recently but I only see the results from 7/1.        Past Medical History:  Diagnosis Date  . Anemia   . Anxiety   . Arthritis    osteoarthritis  . Asthma    due to seasonal alllergies  . Atrial fibrillation ([place])   . Carpal tunnel syndrome   . Cataract   . Cervicalgia   . Collagenous colitis 2010  . COPD (chronic obstructive pulmonary disease) ([place])   . Depression    situational  . Diabetes mellitus without complication ([place])   . Dysrhythmia   . GERD (gastroesophageal reflux disease)   . GI bleed   . Headache(784.0)    migraines; 2 x month  . Heart murmur    MVP ( symptomatic)  . History of IBS   . Hyperlipemia   . Hypertension    on medication x 10 years  . Migraine   . MVP (mitral valve prolapse)   . Shortness of breath    exertional  . Sinoatrial node dysfunction (HCC)   . Sleep apnea 2007   does not use CPAP since 40 # wt loss  . Stroke ([place])   . Syncopal episodes     Patient Active Problem List   Diagnosis Date Noted  . Collagenous colitis   . Dehydration 04/07/2019  . Leg swelling 03/19/2016  . Essential hypertension 03/19/2016  . Obesity 09/10/2015  . Bronchitis, acute 04/11/2015  . OSA (obstructive sleep apnea) 10/18/2014  . Nodule of right lung 06/14/2014  . Dyspnea 06/13/2014  . Coronary artery calcification 04/17/2014  . Aneurysm, ascending aorta ([place]) 04/17/2014  . Chronic cough 02/24/2014  . Mold suspected exposure 02/24/2014    Past Surgical History:  Procedure Laterality Date  . ABDOMINAL HYSTERECTOMY  1979  . BACK SURGERY    . BREAST CYST ASPIRATION Right 25 plus yrs ago  . CARDIAC CATHETERIZATION  2012   [facility]  . CHOLECYSTECTOMY  2011  . COLONOSCOPY WITH PROPOFOL N/A 03/25/2019   Procedure: COLONOSCOPY WITH PROPOFOL;  Surgeon: Lollie Sails, MD;  Location: [facility] ENDOSCOPY;  Service: Endoscopy;  Laterality: N/A;  . ESOPHAGOGASTRODUODENOSCOPY (EGD) WITH PROPOFOL N/A 03/25/2019   Procedure: ESOPHAGOGASTRODUODENOSCOPY (EGD) WITH PROPOFOL;  Surgeon: Lollie Sails, MD;  Location: [facility] ENDOSCOPY;  Service: Endoscopy;  Laterality: N/A;  . FLEXIBLE BRONCHOSCOPY N/A 10/10/2016   Procedure: FLEXIBLE BRONCHOSCOPY;  Surgeon: [name], MD;  Location: [facility] ORS;  Service: Pulmonary;  Laterality:  N/A;  . FLEXIBLE BRONCHOSCOPY N/A 12/11/2017   Procedure: FLEXIBLE BRONCHOSCOPY;  Surgeon: [name], MD;  Location: [facility] ORS;  Service: Pulmonary;  Laterality: N/A;  . [place], 2012 x 2   bilateral  . LUMBAR LAMINECTOMY  [id number]  . NASAL SINUS SURGERY  1994  . TONSILLECTOMY    . TOTAL KNEE ARTHROPLASTY  12/22/2011   Procedure: TOTAL KNEE ARTHROPLASTY;  Surgeon: Rudean Haskell, MD;  Location: [place];  Service: Orthopedics;  Laterality: Left;    Prior to Admission medications   Medication Sig Start Date End  Date Taking? Authorizing Provider  albuterol (PROVENTIL HFA;VENTOLIN HFA) 108 (90 Base) MCG/ACT inhaler Inhale 1-2 puffs into the lungs every 6 (six) hours as needed for wheezing or shortness of breath. 06/08/18   [name], MD  ALPRAZolam Duanne Moron) 0.5 MG tablet Take 0.5 mg by mouth at bedtime.     [provider]  aspirin EC 81 MG tablet Take 81 mg by mouth daily.    [provider]  Biotin 1000 MCG tablet Take 1,000 mcg by mouth 3 (three) times daily.    [provider]  budesonide (ENTOCORT EC) 3 MG 24 hr capsule Take 3 capsules (9 mg total) by mouth daily. 04/11/19   Stark Jock Jude, MD  carboxymethylcellulose (REFRESH PLUS) 0.5 % SOLN Place 1 drop into both eyes 2 (two) times daily.     [provider]  chlorpheniramine-HYDROcodone (TUSSIONEX PENNKINETIC ER) 10-8 MG/5ML SUER Take 5 mLs by mouth at bedtime as needed for cough. 02/07/19   [name], MD  diclofenac (VOLTAREN) 75 MG EC tablet Take 75 mg by mouth 2 (two) times daily.    [provider]  diltiazem (DILACOR XR) 180 MG 24 hr capsule Take 180 mg by mouth daily.    [provider]  EPIPEN 2-PAK 0.3 MG/0.3ML SOAJ injection Inject 0.3 mg into the muscle once.     [provider]  glucose blood test strip 1 each by Other route as needed for other. Use as instructed    [provider]  hydrochlorothiazide (HYDRODIURIL) 12.5 MG tablet Take 12.5 mg by mouth daily. 03/15/18   [provider]  hyoscyamine (LEVSIN) 0.125 MG tablet Take 0.125 mg by mouth every 4 (four) hours as needed.    [provider]  Ibuprofen-diphenhydrAMINE Cit (ADVIL PM) 200-38 MG TABS Take 3 tablets by mouth at bedtime.    [provider]  loperamide (IMODIUM) 2 MG capsule Take 1 capsule (2 mg total) by mouth 4 (four) times daily. 04/10/19   Stark Jock Jude, MD  Melatonin 5 MG TABS Take 5 mg by mouth at bedtime.    [provider]  memantine (NAMENDA) 5 MG tablet Take 5  mg by mouth 2 (two) times daily. 03/13/19 03/12/20  [provider]  mesalamine (LIALDA) 1.2 g EC tablet Take 4.8 g by mouth daily with breakfast. 04/04/19 05/04/19  [provider]  metoprolol tartrate (LOPRESSOR) 50 MG tablet TAKE 1 TABLET(50 MG) BY MOUTH TWICE DAILY 11/24/18   [name], MD  Omalizumab Arvid Right) 150 MG/ML SOSY Inject into the skin.    [provider]  ondansetron (ZOFRAN-ODT) 4 MG disintegrating tablet Take 1 tablet (4 mg total) by mouth every 8 (eight) hours as needed for nausea or vomiting. 04/27/19   Merlyn Lot, MD  potassium chloride (K-DUR) 10 MEQ tablet Take 10 mEq by mouth 2 (two) times daily. 03/30/19  03/29/20  [provider]  prazosin (MINIPRESS) 1 MG capsule Take 1 mg by mouth at bedtime. 03/11/19 03/10/20  [provider]  RABEprazole (ACIPHEX) 20 MG tablet Take 20 mg by mouth daily. 50mnutes prior to a meal. 03/25/19   [provider]  rosuvastatin (CRESTOR) 40 MG tablet Take 1 tablet (40 mg total) by mouth daily. 11/11/16 04/06/19  GMinna Merritts MD  sertraline (ZOLOFT) 100 MG tablet Take 150 mg by mouth daily.     [provider]  sucralfate (CARAFATE) 1 g tablet Take 1 g by mouth 2 (two) times daily. 03/25/19   [provider]  vitamin B-12 (CYANOCOBALAMIN) 1000 MCG tablet Take 1,000 mcg by mouth daily.    [provider]  Vitamin D, Ergocalciferol, (DRISDOL) 50000 units CAPS capsule Take 50,000 Units by mouth every Saturday.  10/08/16   [provider]  vitamin E 600 UNIT capsule Take 600 Units by mouth 2 (two) times daily.    [provider]    Allergies Contrast media [iodinated diagnostic agents], Gadolinium derivatives, Gadoversetamide, Shellfish allergy, Sulfa antibiotics, Hydroxychloroquine sulfate, Banana, Limonene, Morphine and related, Nabumetone, Otezla [apremilast], Potassium-containing compounds, Prednisone, Sulfasalazine, Sulfonamide derivatives,  Metronidazole, and Sulfamethoxazole  Family History  Problem Relation Age of Onset  . Hypertension Mother   . Hyperlipidemia Mother   . Diabetes Mother   . Hypertension Father   . Hyperlipidemia Father   . Arrhythmia Father        A-Fib  . Diabetes Paternal Uncle   . Diabetes Paternal Grandfather   . Breast cancer Maternal Aunt        great in late 765's . Anesthesia problems Neg Hx   . Colon cancer Neg Hx   . Stomach cancer Neg Hx   . Rectal cancer Neg Hx   . Liver cancer Neg Hx   . Esophageal cancer Neg Hx     Social History Social History   Tobacco Use  . Smoking status: Never Smoker  . Smokeless tobacco: Never Used  Substance Use Topics  . Alcohol use: Yes    Alcohol/week: 1.0 standard drinks    Types: 1 Glasses of wine per week    Comment: occasional wine - less than once a month  . Drug use: Never    Review of Systems  Constitutional: No fever/chills Eyes: No visual changes. ENT: No sore throat. Cardiovascular: Denies chest pain. Respiratory: Denies shortness of breath. Gastrointestinal: Positive for abdominal pain, nausea, vomiting and diarrhea.  No constipation. Genitourinary: Negative for dysuria. Musculoskeletal: Negative for back pain. Skin: Negative for rash. Neurological: Negative for headaches, focal weakness or numbness.   ____________________________________________   PHYSICAL EXAM:  VITAL SIGNS: ED Triage Vitals  Enc Vitals Group     BP 04/30/19 0529 (!) 85/68     Pulse Rate 04/30/19 0529 (!) 123     Resp 04/30/19 0529 18     Temp 04/30/19 0529 (!) 97.1 F (36.2 C)     Temp Source 04/30/19 0529 Oral     SpO2 04/30/19 0529 93 %     Weight 04/30/19 0530 248 lb (112.5 kg)     Height 04/30/19 0530 _0  (1.702 m)     Head Circumference --      Peak Flow --      Pain Score 04/30/19 0529 6     Pain Loc --      Pain Edu? --      [place] in [place] --  Constitutional: Alert and oriented.  Elderly appearing and in mild to moderate acute  distress. Eyes: Conjunctivae are normal. PERRL. EOMI. Head: Atraumatic. Nose: No congestion/rhinnorhea. Mouth/Throat: Mucous membranes are mildly dry.  Oropharynx non-erythematous. Neck: No stridor.   Cardiovascular: Tachycardic rate, regular rhythm. Grossly normal heart sounds.  Good peripheral circulation. Respiratory: Normal respiratory effort.  No retractions. Lungs CTAB. Gastrointestinal: Soft and minimally diffusely tender to palpation without rebound or guarding. No distention. No abdominal bruits. No CVA tenderness. Musculoskeletal: No lower extremity tenderness nor edema.  No joint effusions. Neurologic:  Normal speech and language. No gross focal neurologic deficits are appreciated.  Skin:  Skin is warm, dry and intact. No rash noted. Psychiatric: Mood and affect are normal. Speech and behavior are normal.  ____________________________________________   LABS (all labs ordered are listed, but only abnormal results are displayed)  Labs Reviewed  COMPREHENSIVE METABOLIC PANEL - Abnormal; Notable for the following components:      Result Value   Sodium 130 (*)    Potassium 3.2 (*)    Chloride 97 (*)    CO2 21 (*)    Glucose, Bld 163 (*)    BUN 26 (*)    Creatinine, Ser 1.76 (*)    Albumin 2.6 (*)    GFR calc non Af Amer 29 (*)    GFR calc Af Amer 33 (*)    All other components within normal limits  CULTURE, BLOOD (ROUTINE X 2)  CULTURE, BLOOD (ROUTINE X 2)  SARS CORONAVIRUS 2 (HOSPITAL ORDER, [place] [facility])  GASTROINTESTINAL PANEL BY PCR, STOOL (REPLACES STOOL CULTURE)  CBC WITH DIFFERENTIAL/PLATELET  PROTIME-INR  LACTIC ACID, PLASMA  LACTIC ACID, PLASMA  URINALYSIS, COMPLETE (UACMP) WITH MICROSCOPIC  LIPASE, BLOOD  TROPONIN I (HIGH SENSITIVITY)   ____________________________________________  EKG  ED ECG REPORT I, Janne Faulk J, the attending physician, personally viewed and interpreted this ECG.   Date: 04/30/2019  EKG Time: 0533   Rate: 123  Rhythm: sinus tachycardia  Axis: Normal  Intervals:QTC 576  ST&T Change: Nonspecific  ____________________________________________  RADIOLOGY  ED MD interpretation: No acute cardiopulmonary process  Official radiology report(s): Dg Chest Port 1 View  Result Date: 04/30/2019 CLINICAL DATA:  Hypotension EXAM: PORTABLE CHEST 1 VIEW COMPARISON:  None. FINDINGS: The heart size and mediastinal contours are within normal limits. Both lungs are clear. The visualized skeletal structures are unremarkable. IMPRESSION: No active disease. Electronically Signed   By: Ulyses Jarred M.D.   On: 04/30/2019 06:21    ____________________________________________   PROCEDURES  Procedure(s) performed (including Critical Care):  Procedures  CRITICAL CARE Performed by: Paulette Blanch   Total critical care time: 30 minutes  Critical care time was exclusive of separately billable procedures and treating other patients.  Critical care was necessary to treat or prevent imminent or life-threatening deterioration.  Critical care was time spent personally by me on the following activities: development of treatment plan with patient and/or surrogate as well as nursing, discussions with consultants, evaluation of patient's response to treatment, examination of patient, obtaining history from patient or surrogate, ordering and performing treatments and interventions, ordering and review of laboratory studies, ordering and review of radiographic studies, pulse oximetry and re-evaluation of patient's condition. ____________________________________________   INITIAL IMPRESSION / ASSESSMENT AND PLAN / ED COURSE  As part of my medical decision making, I reviewed the following data within the [place] notes reviewed and incorporated, Labs reviewed, EKG interpreted, Old chart reviewed, Radiograph reviewed, Discussed with admitting  physician and Notes from prior ED visits      Caitlin Leonard was evaluated in Emergency Department on 04/30/2019 for the symptoms described in the history of present illness. She was evaluated in the context of the global COVID-19 pandemic, which necessitated consideration that the patient might be at risk for infection with the SARS-CoV-2 virus that causes COVID-19. Institutional protocols and algorithms that pertain to the evaluation of patients at risk for COVID-19 are in a state of rapid change based on information released by regulatory bodies including the CDC and federal and state organizations. These policies and algorithms were followed during the patient's care in the ED.   73 year old female with collagenous colitis who presents with diarrhea > nausea/vomiting x4 weeks. Differential diagnosis includes, but is not limited to, ovarian cyst, ovarian torsion, acute appendicitis, diverticulitis, urinary tract infection/pyelonephritis, endometriosis, bowel obstruction, colitis, renal colic, gastroenteritis, hernia, fibroids, endometriosis, pregnancy related pain including ectopic pregnancy, etc.  Patient arrives tachycardic and hypotensive.  IV fluid resuscitation started immediately.  I personally reviewed patient's records and see she had a CT scan just 3 days ago.  Will hold imaging study.  Laboratory studies reveal dehydration which is consistent with her diarrheal illness.  Will cover with IV Zosyn.  Anticipate hospitalization.   Clinical Course as of Apr 30 643  Sat Apr 30, 2019  0643 Patient has CHF and does not wish to be volume overloaded; will maximize IV fluids at  2L for now.  Pressure improving with IV fluids.  Have discussed case with hospitalist who will evaluate patient in the emergency department for admission.   [JS]    Clinical Course User Index [JS] Paulette Blanch, MD     ____________________________________________   FINAL CLINICAL IMPRESSION(S) / ED DIAGNOSES  Final diagnoses:  Generalized abdominal pain   Nausea vomiting and diarrhea  Dehydration  Hypotension, unspecified hypotension type     ED Discharge Orders    None       Note:  This document was prepared using Dragon voice recognition software and may include unintentional dictation errors.   Paulette Blanch, MD 04/30/19 [phone]

## 2019-04-30 NOTE — ED Notes (Signed)
Lab here for second set of blood cultures.

## 2019-04-30 NOTE — Consult Note (Addendum)
[facility] GI Inpatient Consult Note   Kathline Magic, M.D.  Reason for Consult: Diarrhea, abdominal pain, hx collagenous colitis   Attending Requesting Consult: Epifanio Lesches, M.D.   History of Present Illness: Caitlin Leonard is a 73 y.o. female who recently underwent EGD and colonoscopy by Dr. Gustavo Lah on 03/25/2019 showing H Pylori negative gastritis on the EGD and Collagenous colitis on colon biopsies. Patient reported to the ED on last Wednesday when labs were taken on noted to be normal. She was released by the ED physician with directions to have close follow up with our office at [facility].  2 days later, however, the patient presented to the emergency room room again except this time with some hypotensive findings.  Patient was admitted with presumptive dehydration with intractable diarrhea. The patient says she has had watery stools, 6 to 8/day for the last 6 weeks or more.  She denies any previous history of chronic diarrhea.  When asked about NSAIDs, the patient says she was placed on a regular aspirin, 325 mg, a "couple of weeks ago" by her cardiologist but has decreased that down to 181 mg aspirin per day for history of "congestive heart failure" and history of stroke/TIA.  From a temporal standpoint, the diarrhea appeared to worsen after patient initiated aspirin therapy.  Patient reports that budesonide capsules appeared to work initially, but the effect seemed to wear off over the past few weeks.  Patient has taken Zoloft for several years and previously noticed no problem with this medication having a negative impact on her stool habit.  There has been no hematochezia or melena.  Past Medical History:  Past Medical History:  Diagnosis Date  . Anemia   . Anxiety   . Arthritis    osteoarthritis  . Asthma    due to seasonal alllergies  . Atrial fibrillation ([place])   . Carpal tunnel syndrome   . Cataract   . Cervicalgia   . Collagenous colitis 2010  . COPD (chronic  obstructive pulmonary disease) ([place])   . Depression    situational  . Diabetes mellitus without complication ([place])   . Dysrhythmia   . GERD (gastroesophageal reflux disease)   . GI bleed   . Headache(784.0)    migraines; 2 x month  . Heart murmur    MVP ( symptomatic)  . History of IBS   . Hyperlipemia   . Hypertension    on medication x 10 years  . Migraine   . MVP (mitral valve prolapse)   . Shortness of breath    exertional  . Sinoatrial node dysfunction (HCC)   . Sleep apnea 2007   does not use CPAP since 40 # wt loss  . Stroke ([place])   . Syncopal episodes     Problem List: Patient Active Problem List   Diagnosis Date Noted  . Diarrhea 04/30/2019  . Septic shock ([place]) 04/30/2019  . Collagenous colitis   . Dehydration 04/07/2019  . Leg swelling 03/19/2016  . Essential hypertension 03/19/2016  . Obesity 09/10/2015  . Bronchitis, acute 04/11/2015  . OSA (obstructive sleep apnea) 10/18/2014  . Nodule of right lung 06/14/2014  . Dyspnea 06/13/2014  . Coronary artery calcification 04/17/2014  . Aneurysm, ascending aorta ([place]) 04/17/2014  . Chronic cough 02/24/2014  . Mold suspected exposure 02/24/2014    Past Surgical History: Past Surgical History:  Procedure Laterality Date  . ABDOMINAL HYSTERECTOMY  1979  . BACK SURGERY    . BREAST CYST ASPIRATION Right 25  plus yrs ago  . CARDIAC CATHETERIZATION  2012   [facility]  . CHOLECYSTECTOMY  2011  . COLONOSCOPY WITH PROPOFOL N/A 03/25/2019   Procedure: COLONOSCOPY WITH PROPOFOL;  Surgeon: Lollie Sails, MD;  Location: [facility] ENDOSCOPY;  Service: Endoscopy;  Laterality: N/A;  . ESOPHAGOGASTRODUODENOSCOPY (EGD) WITH PROPOFOL N/A 03/25/2019   Procedure: ESOPHAGOGASTRODUODENOSCOPY (EGD) WITH PROPOFOL;  Surgeon: Lollie Sails, MD;  Location: [facility] ENDOSCOPY;  Service: Endoscopy;  Laterality: N/A;  . FLEXIBLE BRONCHOSCOPY N/A 10/10/2016   Procedure: FLEXIBLE BRONCHOSCOPY;  Surgeon: [name], MD;  Location: [facility] ORS;   Service: Pulmonary;  Laterality: N/A;  . FLEXIBLE BRONCHOSCOPY N/A 12/11/2017   Procedure: FLEXIBLE BRONCHOSCOPY;  Surgeon: [name], MD;  Location: [facility] ORS;  Service: Pulmonary;  Laterality: N/A;  . [place], 2012 x 2   bilateral  . LUMBAR LAMINECTOMY  [id number]  . NASAL SINUS SURGERY  1994  . TONSILLECTOMY    . TOTAL KNEE ARTHROPLASTY  12/22/2011   Procedure: TOTAL KNEE ARTHROPLASTY;  Surgeon: Rudean Haskell, MD;  Location: [place];  Service: Orthopedics;  Laterality: Left;    Allergies: Allergies  Allergen Reactions  . Contrast Media [Iodinated Diagnostic Agents] Anaphylaxis  . Gadolinium Derivatives Anaphylaxis  . Gadoversetamide Anaphylaxis  . Shellfish Allergy Nausea And Vomiting and Other (See Comments)    Can eat shrimp but not oysters Dizziness,severe nausea and vomiting ( can eat shrimp CAN NOT EAT ORYSTERS) Other reaction(s): Other (See Comments) Dizziness,severe nausea and vomiting ( can eat shrimp CAN NOT EAT ORYSTERS)  Can eat shrimp but not oysters Dizziness,severe nausea and vomiting ( can eat shrimp CAN NOT EAT ORYSTERS)  Can eat shrimp but not oysters   . Sulfa Antibiotics Hives and Other (See Comments)    GI upset GI upset GI upset  GI upset GI upset GI upset Other reaction(s): Other (See Comments) GI upset GI upset  GI upset GI upset   . Hydroxychloroquine Sulfate Hives and Other (See Comments)    Severe hives, all over like chicken pox.  . Banana Nausea And Vomiting and Other (See Comments)    Raw bananas  . Limonene Other (See Comments)    GI upset GI upset   . Morphine And Related   . Nabumetone Other (See Comments)    Hypertension  . Otezla [Apremilast]   . Potassium-Containing Compounds Other (See Comments)    GI upset  . Prednisone Nausea And Vomiting  . Sulfasalazine Hives and Other (See Comments)    GI upset  . Sulfonamide Derivatives Hives  . Metronidazole Rash  .  Sulfamethoxazole Rash    Home Medications: (Not in a hospital admission)  Home medication reconciliation was completed with the patient.   Scheduled Inpatient Medications:   . ALPRAZolam  0.5 mg Oral QHS  . aspirin EC  81 mg Oral Daily  . budesonide  9 mg Oral Daily  . dupilumab  300 mg Subcutaneous Once  . heparin  5,000 Units Subcutaneous Q8H  . memantine  5 mg Oral BID  . metoprolol tartrate  50 mg Oral BID  . omalizumab  375 mg Subcutaneous Q14 Days  . omalizumab  375 mg Subcutaneous Q14 Days  . prazosin  1 mg Oral QHS  . rosuvastatin  40 mg Oral Daily  . sertraline  150 mg Oral Daily    Continuous Inpatient Infusions:   . sodium chloride    . piperacillin-tazobactam      PRN  Inpatient Medications:  acetaminophen **OR** acetaminophen, chlorpheniramine-HYDROcodone, ondansetron **OR** ondansetron (ZOFRAN) IV  Family History: family history includes Arrhythmia in her father; Breast cancer in her maternal aunt; Diabetes in her mother, paternal grandfather, and paternal uncle; Hyperlipidemia in her father and mother; Hypertension in her father and mother.   GI Family History: Negative  Social History:   reports that she has never smoked. She has never used smokeless tobacco. She reports current alcohol use of about 1.0 standard drinks of alcohol per week. She reports that she does not use drugs. The patient denies ETOH, tobacco, or drug use.    Review of Systems: Review of Systems - General ROS: positive for  - fatigue, malaise, sleep disturbance and weight loss negative for - chills Psychological ROS: positive for - anxiety negative for - suicidal ideation Ophthalmic ROS: negative ENT ROS: negative Allergy and Immunology ROS: negative Hematological and Lymphatic ROS: negative Respiratory ROS: no cough, shortness of breath, or wheezing Cardiovascular ROS: no chest pain or dyspnea on exertion Genito-Urinary ROS: no dysuria, trouble voiding, or  hematuria Musculoskeletal ROS: negative Neurological ROS: no TIA or stroke symptoms Dermatological ROS: negative  Physical Examination: BP (!) 102/44   Pulse (!) 107   Temp (!) 97.3 F (36.3 C) (Rectal)   Resp 20   Ht _0  (1.702 m)   Wt 112.5 kg   SpO2 96%   BMI 38.84 kg/m  Physical Exam Constitutional:      General: She is not in acute distress.    Appearance: She is ill-appearing. She is not diaphoretic.  HENT:     Head: Normocephalic and atraumatic.  Cardiovascular:     Rate and Rhythm: Normal rate.     Heart sounds: Normal heart sounds. No murmur. No gallop.   Pulmonary:     Effort: Pulmonary effort is normal.     Breath sounds: Normal breath sounds.  Abdominal:     General: Abdomen is flat. Bowel sounds are normal.     Palpations: Abdomen is soft.     Tenderness: There is generalized abdominal tenderness. There is no guarding or rebound.     Hernia: No hernia is present.  Skin:    General: Skin is warm.  Neurological:     General: No focal deficit present.     Mental Status: She is alert.  Psychiatric:        Behavior: Behavior is cooperative.        Thought Content: Thought content normal.        Cognition and Memory: Memory is impaired. She exhibits impaired remote memory.        Judgment: Judgment normal.     Data: Lab Results  Component Value Date   WBC 6.5 04/30/2019   HGB 11.4 (L) 04/30/2019   HCT 34.1 (L) 04/30/2019   MCV 91.7 04/30/2019   PLT 196 04/30/2019   Recent Labs  Lab 04/27/19 1401 04/30/19 0542 04/30/19 0742  HGB 14.1 13.2 11.4*   Lab Results  Component Value Date   NA 130 (L) 04/30/2019   K 3.2 (L) 04/30/2019   CL 97 (L) 04/30/2019   CO2 21 (L) 04/30/2019   BUN 26 (H) 04/30/2019   CREATININE 1.76 (H) 04/30/2019   Lab Results  Component Value Date   ALT 24 04/30/2019   AST 18 04/30/2019   ALKPHOS 80 04/30/2019   BILITOT 0.8 04/30/2019   Recent Labs  Lab 04/30/19 0542  INR 1.0   CBC Latest Ref Rng & Units  04/30/2019  04/30/2019 04/27/2019  WBC 4.0 - 10.5 K/uL 6.5 7.0 10.1  Hemoglobin 12.0 - 15.0 g/dL 11.4(L) 13.2 14.1  Hematocrit 36.0 - 46.0 % 34.1(L) 39.4 42.4  Platelets 150 - 400 K/uL 196 223 338    STUDIES: Dg Chest Port 1 View  Result Date: 04/30/2019 CLINICAL DATA:  Hypotension EXAM: PORTABLE CHEST 1 VIEW COMPARISON:  None. FINDINGS: The heart size and mediastinal contours are within normal limits. Both lungs are clear. The visualized skeletal structures are unremarkable. IMPRESSION: No active disease. Electronically Signed   By: Ulyses Jarred M.D.   On: 04/30/2019 06:21   _0 @  Assessment:  1. Intractable diarhea - No evidence of infectious etiology on recent colonoscopy or current stool studies. I consider this to be an exacerbation of collagenous colitis possibly related to aspirin therapy.  Patient can also have an exacerbation of colitis from sertraline although this is not a new medication that has caused problems previously.  2. Hypotension from dehydration.  3. DNR status  COVID-19 status:   Tested negative     Recommendations:  1. Stop aspirin. 2. Mesalamine does not appear to have effectiveness in microscopic colitis. 3. Discontinue Budesonide. 4. Begin IV solumedrol 43m IV qday and discontinue antibiotics. 5. Low lactose diet. 6. Continue rehydration, BP monitoring. 7. Will follow and modify therapy as needed depending on patient response.   Thank you for the consult. Please call with questions or concerns.  TOlean Ree "KLanny HurstMD KMaria [facility] CenterGastroenterology [address] [place] [phone] 04/30/2019 8:22 AM

## 2019-04-30 NOTE — Consult Note (Signed)
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  Patient has been in the hospital for similar presentation earlier this month and was evaluated by GI and had septic work-up done which was essentially unremarkable.  She has a history of collagenous colitis and is status post colonoscopy and GI evaluation with recommendations to stop mesalamine at that time.  She had a GI stool panel sent off 3 months ago, 3 weeks ago, and on this admission yesterday which were all negative.  She had an x-ray done on this admission which was unremarkable.  Blood work does not reveal leukocytosis or signs of infection, she has not had hematochezia.  PAST MEDICAL HISTORY   Past Medical History:  Diagnosis Date  . Anemia   . Anxiety   . Arthritis    osteoarthritis  . Asthma    due to seasonal alllergies  . Atrial fibrillation ([place])   . Carpal tunnel syndrome   . Cataract   . Cervicalgia   . Collagenous colitis 2010  . COPD (chronic obstructive pulmonary disease) ([place])   . Depression    situational  . Diabetes mellitus without complication ([place])   . Dysrhythmia   . GERD (gastroesophageal reflux disease)   . GI bleed   . Headache(784.0)    migraines; 2 x month  . Heart murmur    MVP ( symptomatic)  . History of IBS   . Hyperlipemia   . Hypertension    on medication x 10 years  . Migraine   . MVP (mitral valve prolapse)   . Shortness of breath    exertional  . Sinoatrial node dysfunction (HCC)   . Sleep apnea  2007   does not use CPAP since 40 # wt loss  . Stroke ([place])   . Syncopal episodes      SURGICAL HISTORY   Past Surgical History:  Procedure Laterality Date  . ABDOMINAL HYSTERECTOMY  1979  . BACK SURGERY    . BREAST CYST ASPIRATION Right 25 plus yrs ago  . CARDIAC CATHETERIZATION  2012   [facility]  . CHOLECYSTECTOMY  2011  . COLONOSCOPY WITH PROPOFOL N/A 03/25/2019   Procedure: COLONOSCOPY WITH PROPOFOL;  Surgeon: Lollie Sails, MD;  Location: [facility] ENDOSCOPY;  Service: Endoscopy;  Laterality: N/A;  . ESOPHAGOGASTRODUODENOSCOPY (EGD) WITH PROPOFOL N/A 03/25/2019   Procedure: ESOPHAGOGASTRODUODENOSCOPY (EGD) WITH PROPOFOL;  Surgeon: Lollie Sails, MD;  Location: [facility] END[facility]COPY;  Service: Endoscopy;  Laterality: N/A;  . FLEXIBLE BRONCHOSCOPY N/A 10/10/2016   Procedure: FLEXIBLE BRONCHOSCOPY;  Surgeon: [name], MD;  Location: [facility] ORS;  Service: Pulmonary;  Laterality: N/A;  . FLEXIBLE BRONCHOSCOPY N/A 12/11/2017   Procedure: FLEXIBLE BRONCHOSCOPY;  Surgeon: [name], MD;  Location: [facility] ORS;  Service: Pulmonary;  Laterality: N/A;  . [place], 2012 x 2   bilateral  . LUMBAR LAMINECTOMY  [id number]  . NASAL SINUS SURGERY  1994  . TONSILLECTOMY    . TOTAL KNEE ARTHROPLASTY  12/22/2011   Procedure: TOTAL KNEE ARTHROPLASTY;  Surgeon: Rudean Haskell, MD;  Location: [place];  Service: Orthopedics;  Laterality: Left;     FAMILY HISTORY  Family History  Problem Relation Age of Onset  . Hypertension Mother   . Hyperlipidemia Mother   . Diabetes Mother   . Hypertension Father   . Hyperlipidemia Father   . Arrhythmia Father        A-Fib  . Diabetes Paternal Uncle   . Diabetes Paternal Grandfather   . Breast cancer Maternal Aunt        great in late 37's  . Anesthesia problems Neg Hx   . Colon cancer Neg Hx   . Stomach cancer Neg Hx   . Rectal cancer Neg Hx   . Liver cancer Neg Hx   . Esophageal cancer Neg Hx       SOCIAL HISTORY   Social History   Tobacco Use  . Smoking status: Never Smoker  . Smokeless tobacco: Never Used  Substance Use Topics  . Alcohol use: Yes    Alcohol/week: 1.0 standard drinks    Types: 1 Glasses of wine per week    Comment: occasional wine - less than once a month  . Drug use: Never     MEDICATIONS   Current Medication:  Current Facility-Administered Medications:  .  0.9 %  sodium chloride infusion, , Intravenous, Continuous, Epifanio Lesches, MD, Last Rate: 75 mL/hr at 04/30/19 1357 .  acetaminophen (TYLENOL) tablet 650 mg, 650 mg, Oral, Q6H PRN **OR** acetaminophen (TYLENOL) suppository 650 mg, 650 mg, Rectal, Q6H PRN, Epifanio Lesches, MD .  ALPRAZolam Duanne Moron) tablet 0.5 mg, 0.5 mg, Oral, QHS, Epifanio Lesches, MD .  Chlorhexidine Gluconate Cloth 2 % PADS 6 each, 6 each, Topical, Daily, Ottie Glazier, MD, 6 each at 04/30/19 0939 .  heparin injection 5,000 Units, 5,000 Units, Subcutaneous, Q8H, Epifanio Lesches, MD, 5,000 Units at 04/30/19 1303 .  memantine (NAMENDA) tablet 5 mg, 5 mg, Oral, BID, Epifanio Lesches, MD, 5 mg at 04/30/19 1029 .  methylPREDNISolone sodium succinate (SOLU-MEDROL) 125 mg/2 mL injection 60 mg, 60 mg, Intravenous, Daily, Honor, Benay Pike, MD, 60 mg at 04/30/19 1355 .  metoprolol tartrate (LOPRESSOR) tablet 50 mg, 50 mg, Oral, BID, Epifanio Lesches, MD, Stopped at 04/30/19 1023 .  ondansetron (ZOFRAN) tablet 4 mg, 4 mg, Oral, Q6H PRN **OR** ondansetron (ZOFRAN) injection 4 mg, 4 mg, Intravenous, Q6H PRN, Epifanio Lesches, MD .  prazosin (MINIPRESS) capsule 1 mg, 1 mg, Oral, QHS, Epifanio Lesches, MD .  rosuvastatin (CRESTOR) tablet 40 mg, 40 mg, Oral, q1800, Epifanio Lesches, MD .  sertraline (ZOLOFT) tablet 150 mg, 150 mg, Oral, Daily, Epifanio Lesches, MD, 150 mg at 04/30/19 1028    ALLERGIES   Contrast media [iodinated diagnostic agents], Gadolinium derivatives, Gadoversetamide, Shellfish  allergy, Sulfa antibiotics, Hydroxychloroquine sulfate, Banana, Limonene, Morphine and related, Nabumetone, Otezla [apremilast], Potassium-containing compounds, Prednisone, Sulfasalazine, Sulfonamide derivatives, Metronidazole, and Sulfamethoxazole    REVIEW OF SYSTEMS     10 point ROS conducted and is negative except as per subjective findings  PHYSICAL EXAMINATION   Vitals:   04/30/19 1200 04/30/19 1300  BP: (!) 111/58 (!) 106/57  Pulse: (!) 101 99  Resp: (!) 24 (!) 21  Temp: 98 F (36.7 C)   SpO2: 91% 92%    GENERAL: Mild distress due to intractable diarrhea HEAD: Normocephalic, atraumatic.  EYES: Pupils equal, round, reactive to light.  No scleral icterus.  MOUTH: Moist mucosal membrane. NECK: Supple. No thyromegaly. No nodules. No JVD.  PULMONARY: Clear to auscultation bilaterally without wheezing CARDIOVASCULAR: S1 and S2. Regular rate and rhythm. No murmurs, rubs, or gallops.  GASTROINTESTINAL: Soft, nontender,  non-distended. No masses. Positive bowel sounds. No hepatosplenomegaly.  MUSCULOSKELETAL: No swelling, clubbing, or edema.  NEUROLOGIC: Mild distress due to acute illness SKIN:intact,warm,dry   LABS AND IMAGING       LAB RESULTS: Recent Labs  Lab 04/27/19 1401 04/30/19 0542 04/30/19 0836  NA 132* 130*  --   K 3.5 3.2*  --   CL 100 97*  --   CO2 20* 21*  --   BUN 13 26*  --   CREATININE 0.96 1.76* 1.60*  GLUCOSE 201* 163*  --    Recent Labs  Lab 04/27/19 1401 04/30/19 0542 04/30/19 0742  HGB 14.1 13.2 11.4*  HCT 42.4 39.4 34.1*  WBC 10.1 7.0 6.5  PLT 338 223 196     IMAGING RESULTS: Dg Chest Port 1 View  Result Date: 04/30/2019 CLINICAL DATA:  Hypotension EXAM: PORTABLE CHEST 1 VIEW COMPARISON:  None. FINDINGS: The heart size and mediastinal contours are within normal limits. Both lungs are clear. The visualized skeletal structures are unremarkable. IMPRESSION: No active disease. Electronically Signed   By: Ulyses Jarred M.D.   On:  04/30/2019 06:21      ASSESSMENT AND PLAN    -Multidisciplinary rounds held today  Acute on chronic high-volume intractable diarrhea -Status post GI evaluation and colonoscopy with recommendation to start IV steroids and stop mesalamine-appreciate collaboration -Patient has been on Xolair which can cause helminth infection secondary to IgE suppression, will send off stool for ova and parasites as well as stool culture -Patient is on cholestyramine also which can cause steatorrhea -She does not appear to be acutely toxic/infected appearing, C. difficile is negative -Supportive care with IV fluid rehydration  Atrial fibrillation -Lopressor 50 p.o. 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 Gilberto Better as an outpatient -Every 2 weeks Xolair 375 IV -Currently on IV Solu-Medrol   GI/Nutrition GI PROPHYLAXIS as indicated DIET-->TF's as tolerated Constipation protocol as indicated  ENDO - ICU hypoglycemic\Hyperglycemia protocol -check FSBS per protocol   ELECTROLYTES -follow labs as needed -replace as needed -pharmacy consultation   DVT/GI PRX ordered -SCDs  TRANSFUSIONS AS NEEDED MONITOR FSBS ASSESS the need for LABS as needed   Critical care provider statement:    Critical care time (minutes):  32   Critical care time was exclusive of:  Separately billable procedures and treating other patients   Critical care was necessary to treat or prevent imminent or life-threatening deterioration of the following conditions:   Acute on chronic high-volume intractable diarrhea, severe asthma, anxiety disorder, obstructive sleep apnea, multiple comorbid conditions   Critical care was time spent personally by me on the following activities:  Development of treatment plan with patient or surrogate,  discussions with consultants, evaluation of patient's response to treatment, examination of patient, obtaining history from patient or surrogate, ordering and performing treatments and interventions, ordering and review of laboratory studies and re-evaluation of patient's condition.  I assumed direction of critical care for this patient from another provider in my specialty: no    This document was prepared using Dragon voice recognition software and may include unintentional dictation errors.    Ottie Glazier, M.D.  Division of [place]

## 2019-05-01 LAB — CBC
HCT: 32.5 % — ABNORMAL LOW (ref 36.0–46.0)
Hemoglobin: 10.7 g/dL — ABNORMAL LOW (ref 12.0–15.0)
MCH: 30.2 pg (ref 26.0–34.0)
MCHC: 32.9 g/dL (ref 30.0–36.0)
MCV: 91.8 fL (ref 80.0–100.0)
Platelets: 187 10*3/uL (ref 150–400)
RBC: 3.54 MIL/uL — ABNORMAL LOW (ref 3.87–5.11)
RDW: 13.1 % (ref 11.5–15.5)
WBC: 3.9 10*3/uL — ABNORMAL LOW (ref 4.0–10.5)
nRBC: 0 % (ref 0.0–0.2)

## 2019-05-01 LAB — PROCALCITONIN: Procalcitonin: 0.12 ng/mL

## 2019-05-01 LAB — BASIC METABOLIC PANEL
Anion gap: 7 (ref 5–15)
BUN: 19 mg/dL (ref 8–23)
CO2: 20 mmol/L — ABNORMAL LOW (ref 22–32)
Calcium: 8.5 mg/dL — ABNORMAL LOW (ref 8.9–10.3)
Chloride: 106 mmol/L (ref 98–111)
Creatinine, Ser: 0.9 mg/dL (ref 0.44–1.00)
GFR calc Af Amer: >60 mL/min (ref >60)
GFR calc non Af Amer: >60 mL/min (ref >60)
Glucose, Bld: 167 mg/dL — ABNORMAL HIGH (ref 70–99)
Potassium: 3.7 mmol/L (ref 3.5–5.1)
Sodium: 133 mmol/L — ABNORMAL LOW (ref 135–145)

## 2019-05-01 LAB — PHOSPHORUS: Phosphorus: 2.9 mg/dL (ref 2.5–4.6)

## 2019-05-01 LAB — ECHOCARDIOGRAM COMPLETE
Height: 67 in
Weight: 3661.4 oz

## 2019-05-01 LAB — GLUCOSE, CAPILLARY: Glucose-Capillary: 145 mg/dL — ABNORMAL HIGH (ref 70–99)

## 2019-05-01 LAB — MAGNESIUM: Magnesium: 2 mg/dL (ref 1.7–2.4)

## 2019-05-01 MED ORDER — SODIUM CHLORIDE 0.9 % IV SOLN
INTRAVENOUS | Status: DC
  Administered 2019-05-01: 19:00:00 via INTRAVENOUS

## 2019-05-01 NOTE — Progress Notes (Signed)
[facility] [place] NAME: Caitlin Leonard    MR#:  [id number]  DATE OF BIRTH:  03/16/1946  SUBJECTIVE: Admitted for severe diarrhea with septic shock, patient was recently admitted and diagnosed with collagenous colitis and discharged home with budesonide but her diarrhea persisted, admitted yesterday morning and found to have severe hypotension so admitted to stepdown for close monitoring.  CHIEF COMPLAINT:   Chief Complaint  Patient presents with  . Abdominal Pain  Says her diarrhea is less and she is feeling little better today.  Has lower quadrant abdominal pain but overall she feels better today.  REVIEW OF SYSTEMS:   ROS CONSTITUTIONAL: Generalized weakness. EYES: No blurred or double vision.  EARS, NOSE, AND THROAT: No tinnitus or ear pain.  RESPIRATORY: No cough, shortness of breath, wheezing or hemoptysis.  CARDIOVASCULAR: No chest pain, orthopnea, edema.  GASTROINTESTINAL: Diarrhea, abdominal pain  gENITOURINARY: No dysuria, hematuria.  ENDOCRINE: No polyuria, nocturia,  HEMATOLOGY: No anemia, easy bruising or bleeding SKIN: No rash or lesion. MUSCULOSKELETAL: No joint pain or arthritis.   NEUROLOGIC: No tingling, numbness, weakness.  PSYCHIATRY: Has anxiety DRUG ALLERGIES:   Allergies  Allergen Reactions  . Contrast Media [Iodinated Diagnostic Agents] Anaphylaxis  . Gadolinium Derivatives Anaphylaxis  . Gadoversetamide Anaphylaxis  . Shellfish Allergy Nausea And Vomiting and Other (See Comments)    Can eat shrimp but not oysters Dizziness,severe nausea and vomiting ( can eat shrimp CAN NOT EAT ORYSTERS) Other reaction(s): Other (See Comments) Dizziness,severe nausea and vomiting ( can eat shrimp CAN NOT EAT ORYSTERS)  Can eat shrimp but not oysters Dizziness,severe nausea and vomiting ( can eat shrimp CAN NOT EAT ORYSTERS)  Can eat shrimp but not oysters   . Sulfa Antibiotics Hives and Other (See Comments)   GI upset GI upset GI upset  GI upset GI upset GI upset Other reaction(s): Other (See Comments) GI upset GI upset  GI upset GI upset   . Hydroxychloroquine Sulfate Hives and Other (See Comments)    Severe hives, all over like chicken pox.  . Banana Nausea And Vomiting and Other (See Comments)    Raw bananas  . Limonene Other (See Comments)    GI upset GI upset   . Morphine And Related   . Nabumetone Other (See Comments)    Hypertension  . Otezla [Apremilast]   . Potassium-Containing Compounds Other (See Comments)    GI upset  . Prednisone Nausea And Vomiting  . Sulfasalazine Hives and Other (See Comments)    GI upset  . Sulfonamide Derivatives Hives  . Metronidazole Rash  . Sulfamethoxazole Rash    VITALS:  Blood pressure (!) 105/45, pulse 86, temperature 97.9 F (36.6 C), temperature source Axillary, resp. rate 19, height 5\' 7"  (1.702 m), weight 107.9 kg, SpO2 94 %.  PHYSICAL EXAMINATION:  GENERAL:  73 y.o.-year-old patient lying in the bed with no acute distress.  EYES: Pupils equal, round, reactive to light  No scleral icterus. Extraocular muscles intact.  HEENT: Head atraumatic, normocephalic. Oropharynx and nasopharynx clear.  NECK:  Supple, no jugular venous distention. No thyroid enlargement, no tenderness.  LUNGS: Normal breath sounds bilaterally, no wheezing, rales,rhonchi or crepitation. No use of accessory muscles of respiration.  CARDIOVASCULAR: S1, S2 normal. No murmurs, rubs, or gallops.  ABDOMEN: Soft, nontender, nondistended. Bowel sounds present. No organomegaly or mass.  Rectal tube present EXTREMITIES: No pedal edema, cyanosis, or clubbing.  NEUROLOGIC: Cranial nerves II through XII are intact. Muscle strength  5/5 in all extremities. Sensation intact. Gait not checked.  PSYCHIATRIC: The patient is alert and oriented x 3.  SKIN: No obvious rash, lesion, or ulcer.    LABORATORY PANEL:   CBC Recent Labs  Lab 05/01/19 0517  WBC 3.9*  HGB  10.7*  HCT 32.5*  PLT 187   ------------------------------------------------------------------------------------------------------------------  Chemistries  Recent Labs  Lab 04/30/19 0542  05/01/19 0517  NA 130*  --  133*  K 3.2*  --  3.7  CL 97*  --  106  CO2 21*  --  20*  GLUCOSE 163*  --  167*  BUN 26*  --  19  CREATININE 1.76*   < > 0.90  CALCIUM 9.3  --  8.5*  MG  --   --  2.0  AST 18  --   --   ALT 24  --   --   ALKPHOS 80  --   --   BILITOT 0.8  --   --    < > = values in this interval not displayed.   ------------------------------------------------------------------------------------------------------------------  Cardiac Enzymes No results for input(s): TROPONINI in the last 168 hours. ------------------------------------------------------------------------------------------------------------------  RADIOLOGY:  Dg Chest Port 1 View  Result Date: 04/30/2019 CLINICAL DATA:  Hypotension EXAM: PORTABLE CHEST 1 VIEW COMPARISON:  None. FINDINGS: The heart size and mediastinal contours are within normal limits. Both lungs are clear. The visualized skeletal structures are unremarkable. IMPRESSION: No active disease. Electronically Signed   By: Ulyses Jarred M.D.   On: 04/30/2019 06:21    EKG:   Orders placed or performed during the hospital encounter of 04/30/19  . EKG 12-Lead  . EKG 12-Lead    ASSESSMENT AND PLAN:   73 year old female with history of recently diagnosed collagenous colitis, started on budesonide, discharged home recently comes back because of profuse diarrhea, admitted yesterday for hypotension with acute kidney injury due to GI losses.  #1. septic shock on admission secondary to severe diarrhea and has collagenous colitis flareup, septic shock is resolving, blood pressure is more stable now, seen by gastroenterology, started on IV steroids, discontinued aspirin, also started on lactose-free diet, she is feeling better, less diarrhea.  If BP is  stable patient can be transferred out of ICU telemetry.  #2. acute renal failure with ATN due to diarrhea, improved now with fluids, adjust IV fluid rate. 3.  Anxiety depression, continue Xanax.,  Zoloft.    #4 .  Essential hypertension, restarted small dose beta-blocker, continue to hold diuretics.  All the records are reviewed and case discussed with Care Management/Social Workerr. Management plans discussed with the patient, family and they are in agreement.  CODE STATUS: DNR  TOTAL TIME TAKING CARE OF THIS PATIENT: 35 minutes.  More than 50% time spent in counseling, coordination of care. POSSIBLE D/C IN 1-2 DAYS, DEPENDING ON CLINICAL CONDITION.   Epifanio Lesches M.D on 05/01/2019 at 2:05 PM  Between 7am to 6pm - Pager - [phone]  After 6pm go to [email or web address] - password [email or web address] [facility]  Office  [phone]  CC: Primary care physician; [place] Pink, MD   Note: This dictation was prepared with Dragon dictation along with smaller phrase technology. Any transcriptional errors that result from this process are unintentional.

## 2019-05-01 NOTE — Progress Notes (Signed)
Pt has remained alert and oriented with no c/o pain. Pt has remained on RA, SpO2 > 90%, lung sounds clear to ausculation. Pt has had minimal output from her flexy seal this shift. Pt remains on a clear liquid diet that she is tolerating very well. Husband and son have been updated via phone regarding pt status.

## 2019-05-01 NOTE — Progress Notes (Signed)
CRITICAL CARE NOTE      CHIEF COMPLAINT:   High-volume diarrhea   SUBJECTIVE    Patient has been hemodynamically stable overnight. Continues to have diarrhea with decreased volume.  Optimizing for downgrade to medical floor  PAST MEDICAL HISTORY   Past Medical History:  Diagnosis Date  . Anemia   . Anxiety   . Arthritis    osteoarthritis  . Asthma    due to seasonal alllergies  . Atrial fibrillation ([place])   . Carpal tunnel syndrome   . Cataract   . Cervicalgia   . Collagenous colitis 2010  . COPD (chronic obstructive pulmonary disease) ([place])   . Depression    situational  . Diabetes mellitus without complication ([place])   . Dysrhythmia   . GERD (gastroesophageal reflux disease)   . GI bleed   . Headache(784.0)    migraines; 2 x month  . Heart murmur    MVP ( symptomatic)  . History of IBS   . Hyperlipemia   . Hypertension    on medication x 10 years  . Migraine   . MVP (mitral valve prolapse)   . Shortness of breath    exertional  . Sinoatrial node dysfunction ([facility])   . Sleep apnea 2007   does not use CPAP since 40 # wt loss  . Stroke ([place])   . Syncopal episodes      SURGICAL HISTORY   Past Surgical History:  Procedure Laterality Date  . ABDOMINAL HYSTERECTOMY  1979  . BACK SURGERY    . BREAST CYST ASPIRATION Right 25 plus yrs ago  . CARDIAC CATHETERIZATION  2012   [facility]  . CHOLECYSTECTOMY  2011  . COLONOSCOPY WITH PROPOFOL N/A 03/25/2019   Procedure: COLONOSCOPY WITH PROPOFOL;  Surgeon: Lollie Sails, MD;  Location: [facility] ENDOSCOPY;  Service: Endoscopy;  Laterality: N/A;  . ESOPHAGOGASTRODUODENOSCOPY (EGD) WITH PROPOFOL N/A 03/25/2019   Procedure: ESOPHAGOGASTRODUODENOSCOPY (EGD) WITH PROPOFOL;  Surgeon: Lollie Sails, MD;  Location[facility] ENDOSCOPY;  Service: Endoscopy;   Laterality: N/A;  . FLEXIBLE BRONCHOSCOPY N/A 10/10/2016   Procedure: FLEXIBLE BRONCHOSCOPY;  Surgeon: [name], MD;  Location: [facility] ORS;  Service: Pulmonary;  Laterality: N/A;  . FLEXIBLE BRONCHOSCOPY N/A 12/11/2017   Procedure: FLEXIBLE BRONCHOSCOPY;  Surgeon: [name], MD;  Location: [facility] ORS;  Service: Pulmonary;  Laterality: N/A;  . [place], 2012 x 2   bilateral  . LUMBAR LAMINECTOMY  0175,1025  . NASAL SINUS SURGERY  1994  . TONSILLECTOMY    . TOTAL KNEE ARTHROPLASTY  12/22/2011   Procedure: TOTAL KNEE ARTHROPLASTY;  Surgeon: Rudean Haskell, MD;  Location: [facility];  Service: Orthopedics;  Laterality: Left;     FAMILY HISTORY   Family History  Problem Relation Age of Onset  . Hypertension Mother   . Hyperlipidemia Mother   . Diabetes Mother   . Hypertension Father   . Hyperlipidemia Father   . Arrhythmia Father        A-Fib  . Diabetes Paternal Uncle   . Diabetes Paternal Grandfather   . Breast cancer Maternal Aunt        great in late 12's  . Anesthesia problems Neg Hx   . Colon cancer Neg Hx   . Stomach cancer Neg Hx   . Rectal cancer Neg Hx   . Liver cancer Neg Hx   . Esophageal cancer Neg Hx      SOCIAL HISTORY  Social History   Tobacco Use  . Smoking status: Never Smoker  . Smokeless tobacco: Never Used  Substance Use Topics  . Alcohol use: Yes    Alcohol/week: 1.0 standard drinks    Types: 1 Glasses of wine per week    Comment: occasional wine - less than once a month  . Drug use: Never     MEDICATIONS   Current Medication:  Current Facility-Administered Medications:  .  0.9 %  sodium chloride infusion, , Intravenous, Continuous, Epifanio Lesches, MD, Last Rate: 75 mL/hr at 04/30/19 1800 .  acetaminophen (TYLENOL) tablet 650 mg, 650 mg, Oral, Q6H PRN **OR** acetaminophen (TYLENOL) suppository 650 mg, 650 mg, Rectal, Q6H PRN, Epifanio Lesches, MD .  ALPRAZolam Duanne Moron) tablet 0.5 mg,  0.5 mg, Oral, QHS, Epifanio Lesches, MD, 0.5 mg at 04/30/19 2223 .  Chlorhexidine Gluconate Cloth 2 % PADS 6 each, 6 each, Topical, Daily, Ottie Glazier, MD, 6 each at 04/30/19 0939 .  heparin injection 5,000 Units, 5,000 Units, Subcutaneous, Q8H, Epifanio Lesches, MD, 5,000 Units at 05/01/19 0617 .  memantine (NAMENDA) tablet 5 mg, 5 mg, Oral, BID, Epifanio Lesches, MD, 5 mg at 04/30/19 2223 .  methylPREDNISolone sodium succinate (SOLU-MEDROL) 125 mg/2 mL injection 60 mg, 60 mg, Intravenous, Daily, [place], Benay Pike, MD, 60 mg at 04/30/19 1355 .  metoprolol tartrate (LOPRESSOR) tablet 50 mg, 50 mg, Oral, BID, Epifanio Lesches, MD, 50 mg at 04/30/19 2223 .  ondansetron (ZOFRAN) tablet 4 mg, 4 mg, Oral, Q6H PRN **OR** ondansetron (ZOFRAN) injection 4 mg, 4 mg, Intravenous, Q6H PRN, Epifanio Lesches, MD .  prazosin (MINIPRESS) capsule 1 mg, 1 mg, Oral, QHS, Epifanio Lesches, MD, 1 mg at 04/30/19 2223 .  rosuvastatin (CRESTOR) tablet 40 mg, 40 mg, Oral, q1800, Epifanio Lesches, MD, 40 mg at 04/30/19 1730 .  sertraline (ZOLOFT) tablet 150 mg, 150 mg, Oral, Daily, Epifanio Lesches, MD, 150 mg at 04/30/19 1028    ALLERGIES   Contrast media [iodinated diagnostic agents], Gadolinium derivatives, Gadoversetamide, Shellfish allergy, Sulfa antibiotics, Hydroxychloroquine sulfate, Banana, Limonene, Morphine and related, Nabumetone, Otezla [apremilast], Potassium-containing compounds, Prednisone, Sulfasalazine, Sulfonamide derivatives, Metronidazole, and Sulfamethoxazole    REVIEW OF SYSTEMS     10 point ROS conducted and is negative except as per subjective findings  PHYSICAL EXAMINATION   Vitals:   05/01/19 0215 05/01/19 0600  BP: (!) 100/59 119/61  Pulse: 80 78  Resp: (!) 22 19  Temp:    SpO2: 96% 98%    GENERAL: Mild distress due to intractable diarrhea HEAD: Normocephalic, atraumatic.  EYES: Pupils equal, round, reactive to light.  No scleral icterus.   MOUTH: Moist mucosal membrane. NECK: Supple. No thyromegaly. No nodules. No JVD.  PULMONARY: Clear to auscultation bilaterally without wheezing CARDIOVASCULAR: S1 and S2. Regular rate and rhythm. No murmurs, rubs, or gallops.  GASTROINTESTINAL: Soft, nontender, non-distended. No masses. Positive bowel sounds. No hepatosplenomegaly.  MUSCULOSKELETAL: No swelling, clubbing, or edema.  NEUROLOGIC: Mild distress due to acute illness SKIN:intact,warm,dry   LABS AND IMAGING       LAB RESULTS: Recent Labs  Lab 04/27/19 1401 04/30/19 0542 04/30/19 0836 05/01/19 0517  NA 132* 130*  --  133*  K 3.5 3.2*  --  3.7  CL 100 97*  --  106  CO2 20* 21*  --  20*  BUN 13 26*  --  19  CREATININE 0.96 1.76* 1.60* 0.90  GLUCOSE 201* 163*  --  167*   Recent Labs  Lab 04/30/19 0542 04/30/19 0742 05/01/19  0517  HGB 13.2 11.4* 10.7*  HCT 39.4 34.1* 32.5*  WBC 7.0 6.5 3.9*  PLT 223 196 187     IMAGING RESULTS: No results found.    ASSESSMENT AND PLAN    -Multidisciplinary rounds held today  Acute on chronic high-volume intractable diarrhea -Status post GI evaluation and colonoscopy with recommendation to start IV steroids and stop mesalamine-appreciate collaboration -Patient has been on Xolair which can cause helminth infection secondary to IgE suppression, will send off stool for ova and parasites as well as stool culture -Patient is on cholestyramine also which can cause steatorrhea -She does not appear to be acutely toxic/infected appearing, C. difficile is negative -Supportive care with IV fluid rehydration  Atrial fibrillation -Lopressor 50 p.o. 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 Gilberto Better as an outpatient -Every 2 weeks Xolair 375 IV -Currently on IV Solu-Medrol   GI/Nutrition GI  PROPHYLAXIS as indicated DIET-->TF's as tolerated Constipation protocol as indicated  ENDO - ICU hypoglycemic\Hyperglycemia protocol -check FSBS per protocol   ELECTROLYTES -follow labs as needed -replace as needed -pharmacy consultation   DVT/GI PRX ordered -SCDs  TRANSFUSIONS AS NEEDED MONITOR FSBS ASSESS the need for LABS as needed   Critical care provider statement:    Critical care time (minutes):  33   Critical care time was exclusive of:  Separately billable procedures and treating other patients   Critical care was necessary to treat or prevent imminent or life-threatening deterioration of the following conditions:   Acute on chronic high-volume intractable diarrhea, severe asthma, anxiety disorder, obstructive sleep apnea, multiple comorbid conditions   Critical care was time spent personally by me on the following activities:  Development of treatment plan with patient or surrogate, discussions with consultants, evaluation of patient's response to treatment, examination of patient, obtaining history from patient or surrogate, ordering and performing treatments and interventions, ordering and review of laboratory studies and re-evaluation of patient's condition.  I assumed direction of critical care for this patient from another provider in my specialty: no    This document was prepared using Dragon voice recognition software and may include unintentional dictation errors.    Ottie Glazier, M.D.  Division of [place]

## 2019-05-01 NOTE — Progress Notes (Signed)
[facility]astroenterology Inpatient Progress Note  Subjective: Patient seen for f/u diarrhea. Patient says she feels somewhat better, still having diarrhea but "less I think". Mood appears improved.   Objective: Vital signs in last 24 hours: Temp:  [97.9 F (36.6 C)-98.7 F (37.1 C)] 97.9 F (36.6 C) (07/26 0000) Pulse Rate:  [78-110] 86 (07/26 1120) Resp:  [19-32] 19 (07/26 1120) BP: (98-143)/(45-74) 105/45 (07/26 1120) SpO2:  [89 %-98 %] 94 % (07/26 1120) Weight:  [107.9 kg] 107.9 kg (07/26 0500) Blood pressure (!) 105/45, pulse 86, temperature 97.9 F (36.6 C), temperature source Axillary, resp. rate 19, height 5\' 7"  (1.702 m), weight 107.9 kg, SpO2 94 %.    Intake/Output from previous day: 07/25 0701 - 07/26 0700 In: 3492.9 [P.O.:480; I.V.:936.9; IV Piggyback:2076] Out: 400 [Stool:400]  Intake/Output this shift: Total I/O In: 540 [P.O.:540] Out: -    General appearance: Alert, NAD. Memory lapse issues. Resp: CTA Cardio:  RRR GI:  Soft, nt, nd. BS+ Extremities:  Trace edema.    Lab Results: Results for orders placed or performed during the hospital encounter of 04/30/19 (from the past 24 hour(s))  Basic metabolic panel     Status: Abnormal   Collection Time: 05/01/19  5:17 AM  Result Value Ref Range   Sodium 133 (L) 135 - 145 mmol/L   Potassium 3.7 3.5 - 5.1 mmol/L   Chloride 106 98 - 111 mmol/L   CO2 20 (L) 22 - 32 mmol/L   Glucose, Bld 167 (H) 70 - 99 mg/dL   BUN 19 8 - 23 mg/dL   Creatinine, Ser 0.90 0.44 - 1.00 mg/dL   Calcium 8.5 (L) 8.9 - 10.3 mg/dL   GFR calc non Af Amer >60 >60 mL/min   GFR calc Af Amer >60 >60 mL/min   Anion gap 7 5 - 15  CBC     Status: Abnormal   Collection Time: 05/01/19  5:17 AM  Result Value Ref Range   WBC 3.9 (L) 4.0 - 10.5 K/uL   RBC 3.54 (L) 3.87 - 5.11 MIL/uL   Hemoglobin 10.7 (L) 12.0 - 15.0 g/dL   HCT 32.5 (L) 36.0 - 46.0 %   MCV 91.8 80.0 - 100.0 fL   MCH 30.2 26.0 - 34.0 pg   MCHC 32.9 30.0 - 36.0 g/dL   RDW 13.1 11.5 - 15.5 %   Platelets 187 150 - 400 K/uL   nRBC 0.0 0.0 - 0.2 %  Procalcitonin - Baseline     Status: None   Collection Time: 05/01/19  5:17 AM  Result Value Ref Range   Procalcitonin 0.12 ng/mL  Magnesium     Status: None   Collection Time: 05/01/19  5:17 AM  Result Value Ref Range   Magnesium 2.0 1.7 - 2.4 mg/dL  Phosphorus     Status: None   Collection Time: 05/01/19  5:17 AM  Result Value Ref Range   Phosphorus 2.9 2.5 - 4.6 mg/dL  Glucose, capillary     Status: Abnormal   Collection Time: 05/01/19  7:37 AM  Result Value Ref Range   Glucose-Capillary 145 (H) 70 - 99 mg/dL     Recent Labs    04/30/19 0542 04/30/19 0742 05/01/19 0517  WBC 7.0 6.5 3.9*  HGB 13.2 11.4* 10.7*  HCT 39.4 34.1* 32.5*  PLT 223 196 187   BMET Recent Labs    04/30/19 0542 04/30/19 0836 05/01/19 0517  NA 130*  --  133*  K 3.2*  --  3.7  CL 97*  --  106  CO2 21*  --  20*  GLUCOSE 163*  --  167*  BUN 26*  --  19  CREATININE 1.76* 1.60* 0.90  CALCIUM 9.3  --  8.5*   LFT Recent Labs    04/30/19 0542  PROT 6.5  ALBUMIN 2.6*  AST 18  ALT 24  ALKPHOS 80  BILITOT 0.8   PT/INR Recent Labs    04/30/19 0542  LABPROT 13.5  INR 1.0   Hepatitis Panel No results for input(s): HEPBSAG, HCVAB, HEPAIGM, HEPBIGM in the last 72 hours. C-Diff No results for input(s): CDIFFTOX in the last 72 hours. No results for input(s): CDIFFPCR in the last 72 hours.   Studies/Results: Dg Chest Port 1 View  Result Date: 04/30/2019 CLINICAL DATA:  Hypotension EXAM: PORTABLE CHEST 1 VIEW COMPARISON:  None. FINDINGS: The heart size and mediastinal contours are within normal limits. Both lungs are clear. The visualized skeletal structures are unremarkable. IMPRESSION: No active disease. Electronically Signed   By: Ulyses Jarred M.D.   On: 04/30/2019 06:21    Scheduled Inpatient Medications:   . ALPRAZolam  0.5 mg Oral QHS  . Chlorhexidine Gluconate Cloth  6 each Topical Daily  . heparin   5,000 Units Subcutaneous Q8H  . memantine  5 mg Oral BID  . methylPREDNISolone (SOLU-MEDROL) injection  60 mg Intravenous Daily  . metoprolol tartrate  50 mg Oral BID  . prazosin  1 mg Oral QHS  . rosuvastatin  40 mg Oral q1800  . sertraline  150 mg Oral Daily    Continuous Inpatient Infusions:   . sodium chloride 75 mL/hr at 04/30/19 1800    PRN Inpatient Medications:  acetaminophen **OR** acetaminophen, ondansetron **OR** ondansetron (ZOFRAN) IV    1. Intractable diarhea - No evidence of infectious etiology on recent colonoscopy or current stool studies. I consider this to be an exacerbation of collagenous colitis possibly related to aspirin therapy.  Patient can also have an exacerbation of colitis from sertraline although this is not a new medication that has caused problems previously.  2. Hypotension from dehydration.  3. DNR status  4. Severe asthma - per Dr. Lanney Gins. NO respiratory distress presently.  COVID-19 status:                                Tested negative   Plan:  1. Continue current therapy.  2. Following.   Taher Vannote K. Alice Reichert, M.D. 05/01/2019, 1:12 PM

## 2019-05-02 LAB — CBC WITH DIFFERENTIAL/PLATELET
Abs Immature Granulocytes: 0.03 10*3/uL (ref 0.00–0.07)
Basophils Absolute: 0 10*3/uL (ref 0.0–0.1)
Basophils Relative: 0 %
Eosinophils Absolute: 0 10*3/uL (ref 0.0–0.5)
Eosinophils Relative: 0 %
HCT: 29.4 % — ABNORMAL LOW (ref 36.0–46.0)
Hemoglobin: 9.7 g/dL — ABNORMAL LOW (ref 12.0–15.0)
Immature Granulocytes: 1 %
Lymphocytes Relative: 14 %
Lymphs Abs: 0.8 10*3/uL (ref 0.7–4.0)
MCH: 30 pg (ref 26.0–34.0)
MCHC: 33 g/dL (ref 30.0–36.0)
MCV: 91 fL (ref 80.0–100.0)
Monocytes Absolute: 0.4 10*3/uL (ref 0.1–1.0)
Monocytes Relative: 7 %
Neutro Abs: 4.3 10*3/uL (ref 1.7–7.7)
Neutrophils Relative %: 78 %
Platelets: 176 10*3/uL (ref 150–400)
RBC: 3.23 MIL/uL — ABNORMAL LOW (ref 3.87–5.11)
RDW: 12.8 % (ref 11.5–15.5)
WBC: 5.4 10*3/uL (ref 4.0–10.5)
nRBC: 0 % (ref 0.0–0.2)

## 2019-05-02 LAB — BASIC METABOLIC PANEL
Anion gap: 4 — ABNORMAL LOW (ref 5–15)
BUN: 15 mg/dL (ref 8–23)
CO2: 22 mmol/L (ref 22–32)
Calcium: 8.6 mg/dL — ABNORMAL LOW (ref 8.9–10.3)
Chloride: 109 mmol/L (ref 98–111)
Creatinine, Ser: 0.69 mg/dL (ref 0.44–1.00)
GFR calc Af Amer: >60 mL/min (ref >60)
GFR calc non Af Amer: >60 mL/min (ref >60)
Glucose, Bld: 136 mg/dL — ABNORMAL HIGH (ref 70–99)
Potassium: 3.4 mmol/L — ABNORMAL LOW (ref 3.5–5.1)
Sodium: 135 mmol/L (ref 135–145)

## 2019-05-02 LAB — PHOSPHORUS: Phosphorus: 2.6 mg/dL (ref 2.5–4.6)

## 2019-05-02 LAB — GLUCOSE, CAPILLARY: Glucose-Capillary: 127 mg/dL — ABNORMAL HIGH (ref 70–99)

## 2019-05-02 LAB — MAGNESIUM: Magnesium: 1.9 mg/dL (ref 1.7–2.4)

## 2019-05-02 MED ORDER — POTASSIUM CHLORIDE IN NACL 40-0.9 MEQ/L-% IV SOLN
INTRAVENOUS | Status: DC
  Administered 2019-05-02 – 2019-05-03 (×2): 50 mL/h via INTRAVENOUS
  Filled 2019-05-02 (×3): qty 1000

## 2019-05-02 MED ORDER — BOOST / RESOURCE BREEZE PO LIQD CUSTOM
1.0000 | Freq: Three times a day (TID) | ORAL | Status: DC
  Administered 2019-05-02 – 2019-05-05 (×8): 1 via ORAL

## 2019-05-02 NOTE — Progress Notes (Signed)
Pharmacy Electrolyte Monitoring Consult:  Pharmacy consulted to assist in monitoring and replacing electrolytes in this 73 y.o. female admitted on 04/30/2019. Patient with collagenous colitis.   Labs:  Sodium (mmol/[place])  Date Value  05/02/2019 135  12/30/2014 134 ([place])   Potassium (mmol/[place])  Date Value  05/02/2019 3.4 ([place])  12/30/2014 3.2 ([place])   Magnesium (mg/dL)  Date Value  05/02/2019 1.9   Phosphorus (mg/dL)  Date Value  05/02/2019 2.6   Calcium (mg/dL)  Date Value  05/02/2019 8.6 ([place])   Calcium, Total (mg/dL)  Date Value  12/30/2014 8.9   Albumin (g/dL)  Date Value  04/30/2019 2.6 ([place])  01/07/2013 3.4    Assessment/Plan: NS at 22mL/hr transitioned to NS/6mEq of potassium at 6mL/hr. Patient is intolerant to oral potassium replacement.   BMP with am labs.   Pharmacy will continue to monitor and adjust per consult.   Caitlin Leonard 05/02/2019 4:41 PM

## 2019-05-02 NOTE — TOC Initial Note (Signed)
Transition of Care [facility]) - Initial/Assessment Note    Patient Details  Name: Caitlin Leonard MRN: [id number] Date of Birth: [birth year]  Transition of Care [facility]) CM/SW Contact:    Candie Chroman, LCSW Phone Number: 05/02/2019, 1:38 PM  Clinical Narrative: CSW met with patient, introduced role, and explained that PT recommendations would be discussed. Patient stated she was already set up with home health through [facility] therapy dept but they weren't able to start services because she was admitted to the hospital. They will come to her home three days per week. CSW verified with [facility] admissions coordinator. She said if patient is working with their dept, it is outpatient. Patient will need orders for outpatient PT/OT at discharge. OT comes into the home but it is considered outpatient. Patient does not feel she needs a nurse or aide. She has a rollator at home that she uses regularly and a bedside commode and shower chair that she only uses during "flare-ups." Patient asked about chaplain. Per notes, a chaplain came to see her earlier today but patient was on the phone and will follow up later.No further concerns. CSW encouraged patient to contact CSW as needed. CSW will continue to follow patient for support and facilitate return home once stable.                Expected Discharge Plan: [place] Barriers to Discharge: Continued Medical Work up   Patient Goals and CMS Choice     Choice offered to / list presented to : NA  Expected Discharge Plan and Services Expected Discharge Plan: [place] Choice: [place] arrangements for the past 2 months: Single Family Home                 DME Arranged: N/A         HH Arranged: PT, OT [place] Agency: Other - See comment(Outpatient therapy through [employer]. OT comes to her home.) Date [place]: 05/02/19   Representative spoke with at [place]: [name]  [facility] admissions coordinator)  Prior Living Arrangements/Services Living arrangements for the past 2 months: Single Family Home Lives with:: Spouse Patient language and need for interpreter reviewed:: Yes Do you feel safe going back to the place where you live?: Yes      Need for Family Participation in Patient Care: Yes (Comment) Care giver support system in place?: Yes (comment) Current home services: DME Criminal Activity/Legal Involvement Pertinent to Current Situation/Hospitalization: No - Comment as needed  Activities of Daily Living Home Assistive Devices/Equipment: Gilford Rile (specify type) ADL Screening (condition at time of admission) Patient's cognitive ability adequate to safely complete daily activities?: Yes Is the patient deaf or have difficulty hearing?: No Does the patient have difficulty seeing, even when wearing glasses/contacts?: No Does the patient have difficulty concentrating, remembering, or making decisions?: No Patient able to express need for assistance with ADLs?: Yes Does the patient have difficulty dressing or bathing?: No Independently performs ADLs?: Yes (appropriate for developmental age) Does the patient have difficulty walking or climbing stairs?: Yes Weakness of Legs: Both Weakness of Arms/Hands: None  Permission Sought/Granted Permission sought to share information with : Facility Art therapist granted to share information with : Yes, Verbal Permission Granted     Permission granted to share info w AGENCY: [place]        Emotional Assessment Appearance:: Appears stated age Attitude/Demeanor/Rapport: Engaged,  Gracious Affect (typically observed): Accepting, Appropriate, Calm, Pleasant Orientation: : Oriented to Self, Oriented to Place, Oriented to  Time, Oriented to Situation Alcohol / Substance Use: Never Used Psych Involvement: No (comment)  Admission diagnosis:  Dehydration [E86.0] Generalized abdominal pain  [R10.84] Nausea vomiting and diarrhea [R11.2, R19.7] Hypotension, unspecified hypotension type [I95.9] Septic shock (HCC) [A41.9, R65.21] Patient Active Problem List   Diagnosis Date Noted  . Diarrhea 04/30/2019  . Septic shock ([place]) 04/30/2019  . Collagenous colitis   . Dehydration 04/07/2019  . Leg swelling 03/19/2016  . Essential hypertension 03/19/2016  . Obesity 09/10/2015  . Bronchitis, acute 04/11/2015  . OSA (obstructive sleep apnea) 10/18/2014  . Nodule of right lung 06/14/2014  . Dyspnea 06/13/2014  . Coronary artery calcification 04/17/2014  . Aneurysm, ascending aorta ([place]) 04/17/2014  . Chronic cough 02/24/2014  . Mold suspected exposure 02/24/2014   PCP:  [place] Pink, MD Pharmacy:   [facility] [phone] [address], [place] [address] Phone: [phone] Fax: [phone]     Social Determinants of Health (SDOH) Interventions    Readmission Risk Interventions No flowsheet data found.

## 2019-05-02 NOTE — Progress Notes (Addendum)
[place] removed.  Explained to patient that she needed to resume regular bowel and bladder pattern. Patient re verbalized her understanding.1100 up to chair with PT. Used walker well-uses one at home. 1200 Back to bed per request of patient.

## 2019-05-02 NOTE — Progress Notes (Signed)
   05/02/19 1100  Clinical Encounter Type  Visited With Patient not available  Visit Type Initial  Referral From Nurse  Consult/Referral To Chaplain  Chaplain stopped by patient was on phone, Chaplain will follow up later.

## 2019-05-02 NOTE — Progress Notes (Signed)
[facility] Gastroenterology Inpatient Progress Note  Subjective: Patient seen for follow up diarrhea. Improving in consistency and frequency, though flexi seal was just removed early this AM.  Objective: Vital signs in last 24 hours: Temp:  [97.6 F (36.4 C)-98.1 F (36.7 C)] 98.1 F (36.7 C) (07/27 1200) Pulse Rate:  [81-93] 81 (07/27 0800) Resp:  [20] 20 (07/27 0800) BP: (120-136)/(49-87) 120/87 (07/27 1200) SpO2:  [95 %] 95 % (07/27 0800) Weight:  [107.7 kg] 107.7 kg (07/27 0500) Blood pressure 120/87, pulse 81, temperature 98.1 F (36.7 C), resp. rate 20, height 5\' 7"  (1.702 m), weight 107.7 kg, SpO2 95 %.    Intake/Output from previous day: 07/26 0701 - 07/27 0700 In: 2272.4 [P.O.:900; I.V.:1372.4] Out: 200 [Urine:200]  Intake/Output this shift: Total I/O In: 2340 [P.O.:2340] Out: 60 [Urine:60]   General appearance: Alert, NAD. No anxiety. Resp: CTA Cardio: RRR BT:DVVO, minimally tender, no rebound. BS+ Extremities:  No edema.   Lab Results: Results for orders placed or performed during the hospital encounter of 04/30/19 (from the past 24 hour(s))  CBC with Differential/Platelet     Status: Abnormal   Collection Time: 05/02/19  5:33 AM  Result Value Ref Range   WBC 5.4 4.0 - 10.5 K/uL   RBC 3.23 (L) 3.87 - 5.11 MIL/uL   Hemoglobin 9.7 (L) 12.0 - 15.0 g/dL   HCT 29.4 (L) 36.0 - 46.0 %   MCV 91.0 80.0 - 100.0 fL   MCH 30.0 26.0 - 34.0 pg   MCHC 33.0 30.0 - 36.0 g/dL   RDW 12.8 11.5 - 15.5 %   Platelets 176 150 - 400 K/uL   nRBC 0.0 0.0 - 0.2 %   Neutrophils Relative % 78 %   Neutro Abs 4.3 1.7 - 7.7 K/uL   Lymphocytes Relative 14 %   Lymphs Abs 0.8 0.7 - 4.0 K/uL   Monocytes Relative 7 %   Monocytes Absolute 0.4 0.1 - 1.0 K/uL   Eosinophils Relative 0 %   Eosinophils Absolute 0.0 0.0 - 0.5 K/uL   Basophils Relative 0 %   Basophils Absolute 0.0 0.0 - 0.1 K/uL   Immature Granulocytes 1 %   Abs Immature Granulocytes 0.03 0.00 - 0.07 K/uL  Magnesium      Status: None   Collection Time: 05/02/19  5:33 AM  Result Value Ref Range   Magnesium 1.9 1.7 - 2.4 mg/dL  Basic metabolic panel     Status: Abnormal   Collection Time: 05/02/19  5:33 AM  Result Value Ref Range   Sodium 135 135 - 145 mmol/L   Potassium 3.4 (L) 3.5 - 5.1 mmol/L   Chloride 109 98 - 111 mmol/L   CO2 22 22 - 32 mmol/L   Glucose, Bld 136 (H) 70 - 99 mg/dL   BUN 15 8 - 23 mg/dL   Creatinine, Ser 0.69 0.44 - 1.00 mg/dL   Calcium 8.6 (L) 8.9 - 10.3 mg/dL   GFR calc non Af Amer >60 >60 mL/min   GFR calc Af Amer >60 >60 mL/min   Anion gap 4 (L) 5 - 15  Phosphorus     Status: None   Collection Time: 05/02/19  5:33 AM  Result Value Ref Range   Phosphorus 2.6 2.5 - 4.6 mg/dL  Glucose, capillary     Status: Abnormal   Collection Time: 05/02/19  7:20 AM  Result Value Ref Range   Glucose-Capillary 127 (H) 70 - 99 mg/dL     Recent Labs  04/30/19 0742 05/01/19 0517 05/02/19 0533  WBC 6.5 3.9* 5.4  HGB 11.4* 10.7* 9.7*  HCT 34.1* 32.5* 29.4*  PLT 196 187 176   BMET Recent Labs    04/30/19 0542 04/30/19 0836 05/01/19 0517 05/02/19 0533  NA 130*  --  133* 135  K 3.2*  --  3.7 3.4*  CL 97*  --  106 109  CO2 21*  --  20* 22  GLUCOSE 163*  --  167* 136*  BUN 26*  --  19 15  CREATININE 1.76* 1.60* 0.90 0.69  CALCIUM 9.3  --  8.5* 8.6*   LFT Recent Labs    04/30/19 0542  PROT 6.5  ALBUMIN 2.6*  AST 18  ALT 24  ALKPHOS 80  BILITOT 0.8   PT/INR Recent Labs    04/30/19 0542  LABPROT 13.5  INR 1.0   Hepatitis Panel No results for input(s): HEPBSAG, HCVAB, HEPAIGM, HEPBIGM in the last 72 hours. C-Diff No results for input(s): CDIFFTOX in the last 72 hours. No results for input(s): CDIFFPCR in the last 72 hours.   Studies/Results: No results found.  Scheduled Inpatient Medications:   . ALPRAZolam  0.5 mg Oral QHS  . Chlorhexidine Gluconate Cloth  6 each Topical Daily  . feeding supplement  1 Container Oral TID WC  . heparin  5,000 Units  Subcutaneous Q8H  . memantine  5 mg Oral BID  . methylPREDNISolone (SOLU-MEDROL) injection  60 mg Intravenous Daily  . metoprolol tartrate  50 mg Oral BID  . prazosin  1 mg Oral QHS  . rosuvastatin  40 mg Oral q1800  . sertraline  150 mg Oral Daily    Continuous Inpatient Infusions:   . 0.9 % NaCl with KCl 40 mEq / L 50 mL/hr (05/02/19 0925)    PRN Inpatient Medications:  acetaminophen **OR** acetaminophen, ondansetron **OR** ondansetron (ZOFRAN) IV    Assessment:  1. Collagenous colitis. Diarrhea Improved somewhat as noted above.   Plan:  1. Continue IV steroids. Transition to po steroids when it is clear patient's improvement is confirmed.  2. Diet as tolerated. 3. Following.   Jamara Vary K. Alice Reichert, M.D. 05/02/2019, 6:33 PM

## 2019-05-02 NOTE — Evaluation (Signed)
Physical Therapy Evaluation Patient Details Name: Caitlin Leonard MRN: [id number] DOB: [birth year] Today's Date: 05/02/2019   History of Present Illness  Pt admitted for diarrhea. Recent admission for collagenous colitis. History includes anemia, COPD, depression, and GERD.   Clinical Impression  Pt is a pleasant 73 year old female who was admitted for diarrhea. Pt performs bed mobility, transfers, and ambulation with cga and RW. Reports she had foley and rectal tube removed this AM, feels better. Pt demonstrates deficits with strength/mobility. Would benefit from skilled PT to address above deficits and promote optimal return to PLOF. Recommend transition to [place] upon discharge from acute hospitalization.     Follow Up Recommendations Home health PT    Equipment Recommendations  None recommended by PT    Recommendations for Other Services       Precautions / Restrictions Precautions Precautions: Fall Restrictions Weight Bearing Restrictions: No      Mobility  Bed Mobility Overal bed mobility: Needs Assistance Bed Mobility: Supine to Sit     Supine to sit: Min guard     General bed mobility comments: safe technique with use of rail. Once seated at EOB, able to sit with supervision  Transfers Overall transfer level: Needs assistance Equipment used: Rolling walker (2 wheeled) Transfers: Sit to/from Stand Sit to Stand: Min guard         General transfer comment: tries to pull on RW. Once standing, able to stand with upright posture  Ambulation/Gait Ambulation/Gait assistance: Min guard Gait Distance (Feet): 50 Feet Assistive device: Rolling walker (2 wheeled) Gait Pattern/deviations: Step-through pattern     General Gait Details: slow gait speed. HR increases to 134 bpm with exertion. Pt fatigues with increased distance. No LOB noted  Stairs            Wheelchair Mobility    Modified Rankin (Stroke Patients Only)       Balance Overall balance  assessment: Needs assistance;History of Falls Sitting-balance support: Feet supported;Bilateral upper extremity supported Sitting balance-Leahy Scale: Good     Standing balance support: Bilateral upper extremity supported Standing balance-Leahy Scale: Fair                               Pertinent Vitals/Pain Pain Assessment: No/denies pain    Home Living Family/patient expects to be discharged to:: Private residence Living Arrangements: Spouse/significant other Available Help at Discharge: Family;Available 24 hours/day Type of Home: Independent living facility Home Access: Level entry     Home Layout: One level Home Equipment: Walker - 2 wheels;Cane - single point;Wheelchair - [employer] - 4 wheels      Prior Function Level of Independence: Needs assistance   Gait / Transfers Assistance Needed: previously was ambulatory with SPC, however recently has been having increased falls. Using RW and WC for primary mobility.           Hand Dominance        Extremity/Trunk Assessment   Upper Extremity Assessment Upper Extremity Assessment: Generalized weakness(B UE grossly 4/5)    Lower Extremity Assessment Lower Extremity Assessment: Generalized weakness(B LE grossly 3+/5)       Communication   Communication: No difficulties  Cognition Arousal/Alertness: Awake/alert Behavior During Therapy: WFL for tasks assessed/performed Overall Cognitive Status: Within Functional Limits for tasks assessed  General Comments      Exercises Other Exercises Other Exercises: supine ther-ex performed including B LE quad sets, AP, and SLRs. 10 reps with supervision Other Exercises: ambulated to [facility] with cga. CGA for transfer and hygiene   Assessment/Plan    PT Assessment Patient needs continued PT services  PT Problem List Decreased strength;Decreased activity tolerance;Decreased balance;Decreased mobility        PT Treatment Interventions Gait training;Therapeutic exercise;Balance training    PT Goals (Current goals can be found in the Care Plan section)  Acute Rehab PT Goals Patient Stated Goal: to go back to [facility] PT Goal Formulation: With patient Time For Goal Achievement: 05/16/19 Potential to Achieve Goals: Good    Frequency Min 2X/week   Barriers to discharge        Co-evaluation               AM-PAC PT "6 Clicks" Mobility  Outcome Measure Help needed turning from your back to your side while in a flat bed without using bedrails?: None Help needed moving from lying on your back to sitting on the side of a flat bed without using bedrails?: A Little Help needed moving to and from a bed to a chair (including a wheelchair)?: A Little Help needed standing up from a chair using your arms (e.g., wheelchair or bedside chair)?: A Little Help needed to walk in hospital room?: A Little Help needed climbing 3-5 steps with a railing? : A Lot 6 Click Score: 18    End of Session Equipment Utilized During Treatment: Gait belt Activity Tolerance: Patient tolerated treatment well Patient left: in chair Nurse Communication: Mobility status PT Visit Diagnosis: Muscle weakness (generalized) (M62.81);History of falling (Z91.81);Repeated falls (R29.6);Difficulty in walking, not elsewhere classified (R26.2)    Time: 2446-2863 PT Time Calculation (min) (ACUTE ONLY): 30 min   Charges:   PT Evaluation $PT Eval Low Complexity: 1 Low PT Treatments $Therapeutic Exercise: 8-22 mins        Greggory Stallion, PT, DPT [phone]   Caitlin Leonard 05/02/2019, 12:05 PM

## 2019-05-02 NOTE — Progress Notes (Signed)
Talked with patients husband. Many questions answered. We went over medications, diet, routines etc. Caitlin Leonard  wants his wife back, not the depressed crying person she is now. Caitlin Leonard states she has mentioned suicide and cries when talking with her children. He wants [facility] to fix her symptoms of colitis so she doesn't have to come back to the hospital. Explained that we were trying to do just that but the hospital cannot guarantee she will stay or return to the same she was pre-hospitalization. I explained to husband that I was not a physician and he needed to call Dr.Toledo and discuss this with him. Husband was distraught that I could not quote Dr. Ricky Stabs doctors office number. I reinterated that her colitis may not go away and she would need  to live with her symptoms. Chaplain called to re visit patient.

## 2019-05-02 NOTE — Progress Notes (Signed)
   05/02/19 1500  Clinical Encounter Type  Visited With Patient  Visit Type Initial;Follow-up  Referral From Nurse  Consult/Referral To Chaplain  Spiritual Encounters  Spiritual Needs Prayer;Emotional  Stress Factors  Patient Stress Factors Health changes;Major life changes  Chaplain received referral for patient and visit patient. Patient was lying in bed and Chaplain introduced herself.Patient was very glad to see Chaplain. She began to get emotional and share with Chaplain how hard it has been without her love ones being able to be present. Patient express how she have been back and forth to the hospital recently. Patient misses doing her club activities, she is very involved in the community when she is not sick. It is very important to her, she expresses how it gives her purpose and life. Patient felt relieved to be able to release her concerns.Patient shared stories about her two sons and husband of 86 years. Chaplain prayed with patient and she was so appreciative for visit and prayed. Chaplain visit's are needed every day, while patient is here.

## 2019-05-02 NOTE — Progress Notes (Signed)
Initial Nutrition Assessment  DOCUMENTATION CODES:   Obesity unspecified  INTERVENTION:  Provide Boost Breeze po TID, each supplement provides 250 kcal and 9 grams of protein.  Once diet advanced recommend changing to Ensure Max Protein po BID, each supplement provides 150 kcal and 30 grams of protein.  Encouraged adequate intake of protein at meals once diet able to be advanced.  NUTRITION DIAGNOSIS:   Inadequate oral intake related to decreased appetite, diarrhea as evidenced by per patient/family report.  GOAL:   Patient will meet greater than or equal to 90% of their needs  MONITOR:   PO intake, Supplement acceptance, Diet advancement, Labs, Weight trends, I & O's  REASON FOR ASSESSMENT:   Malnutrition Screening Tool    ASSESSMENT:   73 year old female with PMHx of HTN, asthma, sleep apnea, GERD, collagenous colitis, arthritis, anxiety, depression, A-fib, COPD, DM, hx CVA admitted with septic shock secondary to severe diarrhea in setting of collagenous colitis flare-up, also with AKI.   Met with patient at bedside. She reports she has had a decreased appetite for a few weeks PTA related to the diarrhea. She reports that any time she would try to eat or drink anything she would immediately have diarrhea. She reports she is feeling better now and has been tolerating the clear liquids. She is ready to have something to "crunch on" but understands diet advancement is per MD and her tolerance. Patient is amenable to drinking ONS to help meet calorie/protein needs.  She reports her UBW was 258 lbs (117.3 kg) and she last weighed this in April 2020. There is a gap in weight history but per chart she was 115.7 kg on 12/03/2018. She is currently 107.7 kg (237.44 lbs). She has lost 8 kg (3.64% body weight) over the past 5 months, which is not significant for time frame.  Medications reviewed and include: Xanax, Solu-Medrol 60 mg daily IV, sertraline, NS with KCl 40 mEq/L at 50  mL/hr.  Labs reviewed: CBG 127, Potassium 3.4, Anion gap 4. Phosphorus and Magnesium WNL.  Patient does not meet criteria for malnutrition at this time.  Patient discussed on rounds this AM.  NUTRITION - FOCUSED PHYSICAL EXAM:    Most Recent Value  Orbital Region  No depletion  Upper Arm Region  No depletion  Thoracic and Lumbar Region  No depletion  Buccal Region  No depletion  Temple Region  No depletion  Clavicle Bone Region  No depletion  Clavicle and Acromion Bone Region  No depletion  Scapular Bone Region  No depletion  Dorsal Hand  No depletion  Patellar Region  No depletion  Anterior Thigh Region  No depletion  Posterior Calf Region  No depletion  Edema (RD Assessment)  Mild  Hair  Reviewed  Eyes  Reviewed  Mouth  Reviewed  Skin  Reviewed  Nails  Reviewed     Diet Order:   Diet Order            Diet clear liquid Room service appropriate? Yes; Fluid consistency: Thin  Diet effective now             EDUCATION NEEDS:   No education needs have been identified at this time  Skin:  Skin Assessment: Skin Integrity Issues:(MSAD to perineum)  Last BM:  05/02/2019 - type 7  Height:   Ht Readings from Last 1 Encounters:  04/30/19 5' 7" (1.702 m)   Weight:   Wt Readings from Last 1 Encounters:  05/02/19 107.7 kg  Ideal Body Weight:  61.4 kg  BMI:  Body mass index is 37.19 kg/m.  Estimated Nutritional Needs:   Kcal:  1950-2150  Protein:  97-107 grams  Fluid:  2 L/day  Willey Blade, MS, RD, LDN Office: [phone] Pager: [phone] After Hours/Weekend Pager: [phone]

## 2019-05-02 NOTE — Progress Notes (Signed)
[place] [facility] [place] NAME: Caitlin Leonard    MR#:  [id number]  DATE OF BIRTH:  1945-11-28  .  CHIEF COMPLAINT:   Chief Complaint  Patient presents with  . Abdominal Pain   Still has diarrhea with mild improvement.  Lower abdominal pain is mild.  REVIEW OF SYSTEMS:   ROS CONSTITUTIONAL: Generalized weakness. EYES: No blurred or double vision.  EARS, NOSE, AND THROAT: No tinnitus or ear pain.  RESPIRATORY: No cough, shortness of breath, wheezing or hemoptysis.  CARDIOVASCULAR: No chest pain, orthopnea, edema.  GASTROINTESTINAL: Diarrhea, abdominal pain  GENITOURINARY: No dysuria, hematuria.  ENDOCRINE: No polyuria, nocturia,  HEMATOLOGY: No anemia, easy bruising or bleeding SKIN: No rash or lesion. MUSCULOSKELETAL: No joint pain or arthritis.   NEUROLOGIC: No tingling, numbness, weakness.  PSYCHIATRY: Has anxiety DRUG ALLERGIES:   Allergies  Allergen Reactions  . Contrast Media [Iodinated Diagnostic Agents] Anaphylaxis  . Gadolinium Derivatives Anaphylaxis  . Gadoversetamide Anaphylaxis  . Shellfish Allergy Nausea And Vomiting and Other (See Comments)    Can eat shrimp but not oysters Dizziness,severe nausea and vomiting ( can eat shrimp CAN NOT EAT ORYSTERS) Other reaction(s): Other (See Comments) Dizziness,severe nausea and vomiting ( can eat shrimp CAN NOT EAT ORYSTERS)  Can eat shrimp but not oysters Dizziness,severe nausea and vomiting ( can eat shrimp CAN NOT EAT ORYSTERS)  Can eat shrimp but not oysters   . Sulfa Antibiotics Hives and Other (See Comments)    GI upset GI upset GI upset  GI upset GI upset GI upset Other reaction(s): Other (See Comments) GI upset GI upset  GI upset GI upset   . Hydroxychloroquine Sulfate Hives and Other (See Comments)    Severe hives, all over like chicken pox.  . Banana Nausea And Vomiting and Other (See Comments)    Raw bananas  . Limonene Other (See Comments)   GI upset GI upset   . Morphine And Related   . Nabumetone Other (See Comments)    Hypertension  . Otezla [Apremilast]   . Potassium-Containing Compounds Other (See Comments)    GI upset  . Prednisone Nausea And Vomiting  . Sulfasalazine Hives and Other (See Comments)    GI upset  . Sulfonamide Derivatives Hives  . Metronidazole Rash  . Sulfamethoxazole Rash    VITALS:  Blood pressure 120/87, pulse 81, temperature 98.1 F (36.7 C), resp. rate 20, height 5\' 7"  (1.702 m), weight 107.7 kg, SpO2 95 %.  PHYSICAL EXAMINATION:  GENERAL:  73 y.o.-year-old patient lying in the bed with no acute distress.  EYES: Pupils equal, round, reactive to light  No scleral icterus. Extraocular muscles intact.  HEENT: Head atraumatic, normocephalic. Oropharynx and nasopharynx clear.  NECK:  Supple, no jugular venous distention. No thyroid enlargement, no tenderness.  LUNGS: Normal breath sounds bilaterally, no wheezing, rales,rhonchi or crepitation. No use of accessory muscles of respiration.  CARDIOVASCULAR: S1, S2 normal. No murmurs, rubs, or gallops.  ABDOMEN: Soft, nontender, nondistended. Bowel sounds present. No organomegaly or mass.  Rectal tube present EXTREMITIES: No pedal edema, cyanosis, or clubbing.  NEUROLOGIC: Cranial nerves II through XII are intact. Muscle strength 5/5 in all extremities. Sensation intact. Gait not checked.  PSYCHIATRIC: The patient is alert and oriented x 3.  SKIN: No obvious rash, lesion, or ulcer.    LABORATORY PANEL:   CBC Recent Labs  Lab 05/02/19 0533  WBC 5.4  HGB 9.7*  HCT 29.4*  PLT 176   ------------------------------------------------------------------------------------------------------------------  Chemistries  Recent Labs  Lab 04/30/19 0542  05/02/19 0533  NA 130*   < > 135  K 3.2*   < > 3.4*  CL 97*   < > 109  CO2 21*   < > 22  GLUCOSE 163*   < > 136*  BUN 26*   < > 15  CREATININE 1.76*   < > 0.69  CALCIUM 9.3   < > 8.6*  MG  --     < > 1.9  AST 18  --   --   ALT 24  --   --   ALKPHOS 80  --   --   BILITOT 0.8  --   --    < > = values in this interval not displayed.   ------------------------------------------------------------------------------------------------------------------  Cardiac Enzymes No results for input(s): TROPONINI in the last 168 hours. ------------------------------------------------------------------------------------------------------------------  RADIOLOGY:  No results found.  EKG:   Orders placed or performed during the hospital encounter of 04/30/19  . EKG 12-Lead  . EKG 12-Lead    ASSESSMENT AND PLAN:   73 year old female with history of recently diagnosed collagenous colitis, started on budesonide, discharged home recently comes back because of profuse diarrhea, admitted for hypotension with acute kidney injury due to GI losses.  *Collagenous colitis No signs of infection on recent colonoscopy and stool studies Responding to IV steroids.  Appreciate GI input. Improving slowly  *Hypovolemic shock on admission secondary to severe diarrhea. Responded to IV fluids.  Briefly needed pressor support.  Blood pressure improved.  *Acute kidney injury secondary to ATN from hypotension. Improving with IV fluids. Repeat labs in the morning  * Anxiety depression, continue Xanax  Zoloft.   *  Essential hypertension, restarted small dose beta-blocker continue to hold diuretics.  All the records are reviewed and case discussed with Care Management/Social Worker Management plans discussed with the patient, family and they are in agreement.  CODE STATUS: DNR  TOTAL TIME TAKING CARE OF THIS PATIENT: 35 minutes.   POSSIBLE D/C IN 1-2 DAYS, DEPENDING ON CLINICAL CONDITION.  Leia Alf Beila Purdie M.D on 05/02/2019 at 2:29 PM  Between 7am to 6pm - Pager - [phone]  After 6pm go to [email or web address] - password [email or web address] [facility]  Office  [phone]  CC: Primary care  physician; [place] Pink, MD   Note: This dictation was prepared with Dragon dictation along with smaller phrase technology. Any transcriptional errors that result from this process are unintentional.

## 2019-05-02 NOTE — Progress Notes (Signed)
Patient talked with chaplain. States she feels more positive about life. Continent of stool and urine. Steadier than this am getting up. Remains on clear liquids.

## 2019-05-02 NOTE — Progress Notes (Signed)
CRITICAL CARE NOTE      CHIEF COMPLAINT:   High-volume diarrhea   SUBJECTIVE    Patient has been hemodynamically stable overnight. Diarreah has resolved, removing rectal tube.   Optimizing for downgrade to medical floor    PAST MEDICAL HISTORY   Past Medical History:  Diagnosis Date  . Anemia   . Anxiety   . Arthritis    osteoarthritis  . Asthma    due to seasonal alllergies  . Atrial fibrillation ([place])   . Carpal tunnel syndrome   . Cataract   . Cervicalgia   . Collagenous colitis 2010  . COPD (chronic obstructive pulmonary disease) ([place])   . Depression    situational  . Diabetes mellitus without complication ([place])   . Dysrhythmia   . GERD (gastroesophageal reflux disease)   . GI bleed   . Headache(784.0)    migraines; 2 x month  . Heart murmur    MVP ( symptomatic)  . History of IBS   . Hyperlipemia   . Hypertension    on medication x 10 years  . Migraine   . MVP (mitral valve prolapse)   . Shortness of breath    exertional  . Sinoatrial node dysfunction (HCC)   . Sleep apnea 2007   does not use CPAP since 40 # wt loss  . Stroke ([place])   . Syncopal episodes      SURGICAL HISTORY   Past Surgical History:  Procedure Laterality Date  . ABDOMINAL HYSTERECTOMY  1979  . BACK SURGERY    . BREAST CYST ASPIRATION Right 25 plus yrs ago  . CARDIAC CATHETERIZATION  2012   [facility]  . CHOLECYSTECTOMY  2011  . COLONOSCOPY WITH PROPOFOL N/A 03/25/2019   Procedure: COLONOSCOPY WITH PROPOFOL;  Surgeon: Lollie Sails, MD;  Location: [facility] ENDOSCOPY;  Service: Endoscopy;  Laterality: N/A;  . ESOPHAGOGASTRODUODENOSCOPY (EGD) WITH PROPOFOL N/A 03/25/2019   Procedure: ESOPHAGOGASTRODUODENOSCOPY (EGD) WITH PROPOFOL;  Surgeon: Lollie Sails, MD;  Location[facility] ENDOSCOPY;  Service: Endoscopy;   Laterality: N/A;  . FLEXIBLE BRONCHOSCOPY N/A 10/10/2016   Procedure: FLEXIBLE BRONCHOSCOPY;  Surgeon: [name], MD;  Location: [facility] ORS;  Service: Pulmonary;  Laterality: N/A;  . FLEXIBLE BRONCHOSCOPY N/A 12/11/2017   Procedure: FLEXIBLE BRONCHOSCOPY;  Surgeon: [name], MD;  Location: [facility] ORS;  Service: Pulmonary;  Laterality: N/A;  . [place], 2012 x 2   bilateral  . LUMBAR LAMINECTOMY  [id number]  . NASAL SINUS SURGERY  1994  . TONSILLECTOMY    . TOTAL KNEE ARTHROPLASTY  12/22/2011   Procedure: TOTAL KNEE ARTHROPLASTY;  Surgeon: Rudean Haskell, MD;  Location: [place];  Service: Orthopedics;  Laterality: Left;     FAMILY HISTORY   Family History  Problem Relation Age of Onset  . Hypertension Mother   . Hyperlipidemia Mother   . Diabetes Mother   . Hypertension Father   . Hyperlipidemia Father   . Arrhythmia Father        A-Fib  . Diabetes Paternal Uncle   . Diabetes Paternal Grandfather   . Breast cancer Maternal Aunt        great in late 51's  . Anesthesia problems Neg Hx   . Colon cancer Neg Hx   . Stomach cancer Neg Hx   . Rectal cancer Neg Hx   . Liver cancer Neg Hx   . Esophageal cancer Neg Hx      SOCIAL  HISTORY   Social History   Tobacco Use  . Smoking status: Never Smoker  . Smokeless tobacco: Never Used  Substance Use Topics  . Alcohol use: Yes    Alcohol/week: 1.0 standard drinks    Types: 1 Glasses of wine per week    Comment: occasional wine - less than once a month  . Drug use: Never     MEDICATIONS   Current Medication:  Current Facility-Administered Medications:  .  0.9 %  sodium chloride infusion, , Intravenous, Continuous, Jozette Castrellon, MD, Last Rate: 50 mL/hr at 05/01/19 1834 .  acetaminophen (TYLENOL) tablet 650 mg, 650 mg, Oral, Q6H PRN, 650 mg at 05/02/19 0055 **OR** acetaminophen (TYLENOL) suppository 650 mg, 650 mg, Rectal, Q6H PRN, Epifanio Lesches, MD .  ALPRAZolam  Duanne Moron) tablet 0.5 mg, 0.5 mg, Oral, QHS, Epifanio Lesches, MD, 0.5 mg at 05/01/19 2124 .  Chlorhexidine Gluconate Cloth 2 % PADS 6 each, 6 each, Topical, Daily, Ottie Glazier, MD, 6 each at 05/01/19 1834 .  heparin injection 5,000 Units, 5,000 Units, Subcutaneous, Q8H, Epifanio Lesches, MD, 5,000 Units at 05/02/19 0554 .  memantine (NAMENDA) tablet 5 mg, 5 mg, Oral, BID, Epifanio Lesches, MD, 5 mg at 05/01/19 2124 .  methylPREDNISolone sodium succinate (SOLU-MEDROL) 125 mg/2 mL injection 60 mg, 60 mg, Intravenous, Daily, [place], Benay Pike, MD, 60 mg at 05/01/19 1100 .  metoprolol tartrate (LOPRESSOR) tablet 50 mg, 50 mg, Oral, BID, Epifanio Lesches, MD, 50 mg at 05/01/19 2124 .  ondansetron (ZOFRAN) tablet 4 mg, 4 mg, Oral, Q6H PRN **OR** ondansetron (ZOFRAN) injection 4 mg, 4 mg, Intravenous, Q6H PRN, Epifanio Lesches, MD .  prazosin (MINIPRESS) capsule 1 mg, 1 mg, Oral, QHS, Epifanio Lesches, MD, 1 mg at 05/01/19 2124 .  rosuvastatin (CRESTOR) tablet 40 mg, 40 mg, Oral, q1800, Epifanio Lesches, MD, 40 mg at 05/01/19 1835 .  sertraline (ZOLOFT) tablet 150 mg, 150 mg, Oral, Daily, Epifanio Lesches, MD, 150 mg at 05/01/19 1100    ALLERGIES   Contrast media [iodinated diagnostic agents], Gadolinium derivatives, Gadoversetamide, Shellfish allergy, Sulfa antibiotics, Hydroxychloroquine sulfate, Banana, Limonene, Morphine and related, Nabumetone, Otezla [apremilast], Potassium-containing compounds, Prednisone, Sulfasalazine, Sulfonamide derivatives, Metronidazole, and Sulfamethoxazole    REVIEW OF SYSTEMS     10 point ROS conducted and is negative except as per subjective findings  PHYSICAL EXAMINATION   Vitals:   05/01/19 2124 05/02/19 0400  BP: 136/75   Pulse: 93   Resp:    Temp:  97.6 F (36.4 C)  SpO2:      GENERAL: Mild distress due to intractable diarrhea HEAD: Normocephalic, atraumatic.  EYES: Pupils equal, round, reactive to light.  No  scleral icterus.  MOUTH: Moist mucosal membrane. NECK: Supple. No thyromegaly. No nodules. No JVD.  PULMONARY: Clear to auscultation bilaterally without wheezing CARDIOVASCULAR: S1 and S2. Regular rate and rhythm. No murmurs, rubs, or gallops.  GASTROINTESTINAL: Soft, nontender, non-distended. No masses. Positive bowel sounds. No hepatosplenomegaly.  MUSCULOSKELETAL: No swelling, clubbing, or edema.  NEUROLOGIC: Mild distress due to acute illness SKIN:intact,warm,dry   LABS AND IMAGING       LAB RESULTS: Recent Labs  Lab 04/30/19 0542 04/30/19 0836 05/01/19 0517 05/02/19 0533  NA 130*  --  133* 135  K 3.2*  --  3.7 3.4*  CL 97*  --  106 109  CO2 21*  --  20* 22  BUN 26*  --  19 15  CREATININE 1.76* 1.60* 0.90 0.69  GLUCOSE 163*  --  167* 136*  Recent Labs  Lab 04/30/19 0742 05/01/19 0517 05/02/19 0533  HGB 11.4* 10.7* 9.7*  HCT 34.1* 32.5* 29.4*  WBC 6.5 3.9* 5.4  PLT 196 187 176     IMAGING RESULTS: No results found.    ASSESSMENT AND PLAN    -Multidisciplinary rounds held today  Acute on chronic high-volume intractable diarrhea -Status post GI evaluation and colonoscopy- likley due to exacerbation of collagenous colitis with recommendation to start IV steroids and stop mesalamine-appreciate collaboration -Patient has been on Xolair which can cause helminth infection secondary to IgE suppression, will send off stool for ova and parasites as well as stool culture -She does not appear to be acutely toxic/infected appearing, C. difficile is negative -Supportive care with IV fluid rehydration  Atrial fibrillation -Lopressor 50 p.o. 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 Gilberto Better as an outpatient -Every 2 weeks Xolair 375 IV -Currently on IV Solu-Medrol   GI/Nutrition  GI PROPHYLAXIS as indicated DIET-->TF's as tolerated Constipation protocol as indicated  ENDO - ICU hypoglycemic\Hyperglycemia protocol -check FSBS per protocol   ELECTROLYTES -follow labs as needed -replace as needed -pharmacy consultation   DVT/GI PRX ordered -SCDs  TRANSFUSIONS AS NEEDED MONITOR FSBS ASSESS the need for LABS as needed   Critical care provider statement:    Critical care time (minutes):  33   Critical care time was exclusive of:  Separately billable procedures and treating other patients   Critical care was necessary to treat or prevent imminent or life-threatening deterioration of the following conditions:   Acute on chronic high-volume intractable diarrhea, severe asthma, anxiety disorder, obstructive sleep apnea, multiple comorbid conditions   Critical care was time spent personally by me on the following activities:  Development of treatment plan with patient or surrogate, discussions with consultants, evaluation of patient's response to treatment, examination of patient, obtaining history from patient or surrogate, ordering and performing treatments and interventions, ordering and review of laboratory studies and re-evaluation of patient's condition.  I assumed direction of critical care for this patient from another provider in my specialty: no    This document was prepared using Dragon voice recognition software and may include unintentional dictation errors.    Ottie Glazier, M.D.  Division of [place]

## 2019-05-03 LAB — BASIC METABOLIC PANEL
Anion gap: 5 (ref 5–15)
BUN: 13 mg/dL (ref 8–23)
CO2: 22 mmol/L (ref 22–32)
Calcium: 8.7 mg/dL — ABNORMAL LOW (ref 8.9–10.3)
Chloride: 110 mmol/L (ref 98–111)
Creatinine, Ser: 0.62 mg/dL (ref 0.44–1.00)
GFR calc Af Amer: >60 mL/min (ref >60)
GFR calc non Af Amer: >60 mL/min (ref >60)
Glucose, Bld: 137 mg/dL — ABNORMAL HIGH (ref 70–99)
Potassium: 3.9 mmol/L (ref 3.5–5.1)
Sodium: 137 mmol/L (ref 135–145)

## 2019-05-03 LAB — CBC WITH DIFFERENTIAL/PLATELET
Abs Immature Granulocytes: 0.11 10*3/uL — ABNORMAL HIGH (ref 0.00–0.07)
Basophils Absolute: 0 10*3/uL (ref 0.0–0.1)
Basophils Relative: 0 %
Eosinophils Absolute: 0 10*3/uL (ref 0.0–0.5)
Eosinophils Relative: 0 %
HCT: 29.2 % — ABNORMAL LOW (ref 36.0–46.0)
Hemoglobin: 9.9 g/dL — ABNORMAL LOW (ref 12.0–15.0)
Immature Granulocytes: 2 %
Lymphocytes Relative: 20 %
Lymphs Abs: 1.2 10*3/uL (ref 0.7–4.0)
MCH: 30.9 pg (ref 26.0–34.0)
MCHC: 33.9 g/dL (ref 30.0–36.0)
MCV: 91.3 fL (ref 80.0–100.0)
Monocytes Absolute: 0.6 10*3/uL (ref 0.1–1.0)
Monocytes Relative: 10 %
Neutro Abs: 3.9 10*3/uL (ref 1.7–7.7)
Neutrophils Relative %: 68 %
Platelets: 153 10*3/uL (ref 150–400)
RBC: 3.2 MIL/uL — ABNORMAL LOW (ref 3.87–5.11)
RDW: 12.9 % (ref 11.5–15.5)
WBC: 5.8 10*3/uL (ref 4.0–10.5)
nRBC: 0 % (ref 0.0–0.2)

## 2019-05-03 LAB — GLUCOSE, CAPILLARY
Glucose-Capillary: 195 mg/dL — ABNORMAL HIGH (ref 70–99)
Glucose-Capillary: 155 mg/dL — ABNORMAL HIGH (ref 70–99)

## 2019-05-03 LAB — HEMOGLOBIN A1C
Hgb A1c MFr Bld: 6.3 % — ABNORMAL HIGH (ref 4.8–5.6)
Mean Plasma Glucose: 134.11 mg/dL

## 2019-05-03 MED ORDER — PREDNISONE 20 MG PO TABS
40.0000 mg | ORAL_TABLET | Freq: Every day | ORAL | Status: DC
  Administered 2019-05-04 – 2019-05-05 (×2): 40 mg via ORAL
  Filled 2019-05-03 (×2): qty 2

## 2019-05-03 MED ORDER — INSULIN ASPART 100 UNIT/ML ~~LOC~~ SOLN
0.0000 [IU] | Freq: Three times a day (TID) | SUBCUTANEOUS | Status: DC
  Administered 2019-05-03 – 2019-05-04 (×3): 2 [IU] via SUBCUTANEOUS
  Administered 2019-05-04: 09:00:00 1 [IU] via SUBCUTANEOUS
  Administered 2019-05-05: 13:00:00 2 [IU] via SUBCUTANEOUS
  Filled 2019-05-03 (×4): qty 1

## 2019-05-03 MED ORDER — ALPRAZOLAM 0.25 MG PO TABS
0.2500 mg | ORAL_TABLET | Freq: Two times a day (BID) | ORAL | Status: DC
  Administered 2019-05-03 – 2019-05-05 (×4): 0.25 mg via ORAL
  Filled 2019-05-03 (×4): qty 1

## 2019-05-03 MED ORDER — ALPRAZOLAM 0.25 MG PO TABS: 0.2500 mg | ORAL_TABLET | Freq: Two times a day (BID) | ORAL | Status: DC

## 2019-05-03 MED ORDER — ENOXAPARIN SODIUM 40 MG/0.4ML ~~LOC~~ SOLN
40.0000 mg | Freq: Every day | SUBCUTANEOUS | Status: DC
  Administered 2019-05-03 – 2019-05-04 (×2): 40 mg via SUBCUTANEOUS
  Filled 2019-05-03 (×2): qty 0.4

## 2019-05-03 MED ORDER — SERTRALINE HCL 50 MG PO TABS
150.0000 mg | ORAL_TABLET | Freq: Every day | ORAL | Status: DC
  Administered 2019-05-03 – 2019-05-04 (×2): 150 mg via ORAL
  Filled 2019-05-03 (×2): qty 3

## 2019-05-03 MED ORDER — MELATONIN 5 MG PO TABS
5.0000 mg | ORAL_TABLET | Freq: Every evening | ORAL | Status: DC | PRN
  Administered 2019-05-03 – 2019-05-04 (×3): 5 mg via ORAL
  Filled 2019-05-03 (×4): qty 1

## 2019-05-03 NOTE — Progress Notes (Signed)
CRITICAL CARE NOTE      CHIEF COMPLAINT:   High-volume diarrhea   SUBJECTIVE    Patient has been hemodynamically stable overnight. Diarreah has resolved, removed rectal tube.     Optimizing for downgrade to medical floor  Patient reports anxiety - will give PRN lowest dose xanax   PAST MEDICAL HISTORY   Past Medical History:  Diagnosis Date  . Anemia   . Anxiety   . Arthritis    osteoarthritis  . Asthma    due to seasonal alllergies  . Atrial fibrillation ([place])   . Carpal tunnel syndrome   . Cataract   . Cervicalgia   . Collagenous colitis 2010  . COPD (chronic obstructive pulmonary disease) ([place])   . Depression    situational  . Diabetes mellitus without complication ([place])   . Dysrhythmia   . GERD (gastroesophageal reflux disease)   . GI bleed   . Headache(784.0)    migraines; 2 x month  . Heart murmur    MVP ( symptomatic)  . History of IBS   . Hyperlipemia   . Hypertension    on medication x 10 years  . Migraine   . MVP (mitral valve prolapse)   . Shortness of breath    exertional  . Sinoatrial node dysfunction (HCC)   . Sleep apnea 2007   does not use CPAP since 40 # wt loss  . Stroke ([place])   . Syncopal episodes      SURGICAL HISTORY   Past Surgical History:  Procedure Laterality Date  . ABDOMINAL HYSTERECTOMY  1979  . BACK SURGERY    . BREAST CYST ASPIRATION Right 25 plus yrs ago  . CARDIAC CATHETERIZATION  2012   [facility]  . CHOLECYSTECTOMY  2011  . COLONOSCOPY WITH PROPOFOL N/A 03/25/2019   Procedure: COLONOSCOPY WITH PROPOFOL;  Surgeon: Lollie Sails, MD;  Location: [facility] ENDOSCOPY;  Service: Endoscopy;  Laterality: N/A;  . ESOPHAGOGASTRODUODENOSCOPY (EGD) WITH PROPOFOL N/A 03/25/2019   Procedure: ESOPHAGOGASTRODUODENOSCOPY (EGD) WITH PROPOFOL;  Surgeon: Lollie Sails, MD;  Location: [facility] ENDOSCOPY;  Service: Endoscopy;  Laterality: N/A;  . FLEXIBLE BRONCHOSCOPY N/A 10/10/2016   Procedure: FLEXIBLE BRONCHOSCOPY;  Surgeon: [name], MD;  Location: [facility] ORS;  Service: Pulmonary;  Laterality: N/A;  . FLEXIBLE BRONCHOSCOPY N/A 12/11/2017   Procedure: FLEXIBLE BRONCHOSCOPY;  Surgeon: [name], MD;  Location: [facility] ORS;  Service: Pulmonary;  Laterality: N/A;  . [place], 2012 x 2   bilateral  . LUMBAR LAMINECTOMY  [id number]  . NASAL SINUS SURGERY  1994  . TONSILLECTOMY    . TOTAL KNEE ARTHROPLASTY  12/22/2011   Procedure: TOTAL KNEE ARTHROPLASTY;  Surgeon: Rudean Haskell, MD;  Location: [place];  Service: Orthopedics;  Laterality: Left;     FAMILY HISTORY   Family History  Problem Relation Age of Onset  . Hypertension Mother   . Hyperlipidemia Mother   . Diabetes Mother   . Hypertension Father   . Hyperlipidemia Father   . Arrhythmia Father        A-Fib  . Diabetes Paternal Uncle   . Diabetes Paternal Grandfather   . Breast cancer Maternal Aunt        great in late 60's  . Anesthesia problems Neg Hx   . Colon cancer Neg Hx   . Stomach cancer Neg Hx   . Rectal cancer Neg Hx   . Liver cancer Neg Hx   .  Esophageal cancer Neg Hx      SOCIAL HISTORY   Social History   Tobacco Use  . Smoking status: Never Smoker  . Smokeless tobacco: Never Used  Substance Use Topics  . Alcohol use: Yes    Alcohol/week: 1.0 standard drinks    Types: 1 Glasses of wine per week    Comment: occasional wine - less than once a month  . Drug use: Never     MEDICATIONS   Current Medication:  Current Facility-Administered Medications:  .  0.9 % NaCl with KCl 40 mEq / L  infusion, , Intravenous, Continuous, Charlett Nose, RPH, Last Rate: 50 mL/hr at 05/03/19 0600 .  acetaminophen (TYLENOL) tablet 650 mg, 650 mg, Oral, Q6H PRN, 650 mg at 05/02/19 0055 **OR** acetaminophen (TYLENOL) suppository  650 mg, 650 mg, Rectal, Q6H PRN, Epifanio Lesches, MD .  ALPRAZolam Duanne Moron) tablet 0.5 mg, 0.5 mg, Oral, QHS, Epifanio Lesches, MD, 0.5 mg at 05/02/19 2221 .  Chlorhexidine Gluconate Cloth 2 % PADS 6 each, 6 each, Topical, Daily, Ottie Glazier, MD, 6 each at 05/02/19 0934 .  feeding supplement (BOOST / RESOURCE BREEZE) liquid 1 Container, 1 Container, Oral, TID WC, Hillary Bow, MD, 1 Container at 05/03/19 0848 .  heparin injection 5,000 Units, 5,000 Units, Subcutaneous, Q8H, Epifanio Lesches, MD, 5,000 Units at 05/03/19 0602 .  Melatonin TABS 5 mg, 5 mg, Oral, QHS PRN, Awilda Bill, NP, 5 mg at 05/03/19 0042 .  memantine (NAMENDA) tablet 5 mg, 5 mg, Oral, BID, Epifanio Lesches, MD, 5 mg at 05/03/19 0851 .  methylPREDNISolone sodium succinate (SOLU-MEDROL) 125 mg/2 mL injection 60 mg, 60 mg, Intravenous, Daily, [place], Benay Pike, MD, 60 mg at 05/03/19 0867 .  metoprolol tartrate (LOPRESSOR) tablet 50 mg, 50 mg, Oral, BID, Epifanio Lesches, MD, 50 mg at 05/03/19 0849 .  ondansetron (ZOFRAN) tablet 4 mg, 4 mg, Oral, Q6H PRN **OR** ondansetron (ZOFRAN) injection 4 mg, 4 mg, Intravenous, Q6H PRN, Epifanio Lesches, MD .  prazosin (MINIPRESS) capsule 1 mg, 1 mg, Oral, QHS, Epifanio Lesches, MD, 1 mg at 05/02/19 2221 .  rosuvastatin (CRESTOR) tablet 40 mg, 40 mg, Oral, q1800, Epifanio Lesches, MD, 40 mg at 05/02/19 1847 .  sertraline (ZOLOFT) tablet 150 mg, 150 mg, Oral, Daily, Epifanio Lesches, MD, 150 mg at 05/02/19 6195    ALLERGIES   Contrast media [iodinated diagnostic agents], Gadolinium derivatives, Gadoversetamide, Shellfish allergy, Sulfa antibiotics, Hydroxychloroquine sulfate, Banana, Limonene, Morphine and related, Nabumetone, Otezla [apremilast], Potassium-containing compounds, Prednisone, Sulfasalazine, Sulfonamide derivatives, Metronidazole, and Sulfamethoxazole    REVIEW OF SYSTEMS     10 point ROS conducted and is negative except as per  subjective findings  PHYSICAL EXAMINATION   Vitals:   05/02/19 2220 05/03/19 0849  BP: (!) 138/59 133/62  Pulse: 72 66  Resp:    Temp:    SpO2:      GENERAL: Mild distress due to intractable diarrhea HEAD: Normocephalic, atraumatic.  EYES: Pupils equal, round, reactive to light.  No scleral icterus.  MOUTH: Moist mucosal membrane. NECK: Supple. No thyromegaly. No nodules. No JVD.  PULMONARY: Clear to auscultation bilaterally without wheezing CARDIOVASCULAR: S1 and S2. Regular rate and rhythm. No murmurs, rubs, or gallops.  GASTROINTESTINAL: Soft, nontender, non-distended. No masses. Positive bowel sounds. No hepatosplenomegaly.  MUSCULOSKELETAL: No swelling, clubbing, or edema.  NEUROLOGIC: Mild distress due to acute illness SKIN:intact,warm,dry   LABS AND IMAGING       LAB RESULTS: Recent Labs  Lab 05/01/19 0517 05/02/19 0533 05/03/19 0932  NA 133* 135 137  K 3.7 3.4* 3.9  CL 106 109 110  CO2 20* 22 22  BUN _0 CREATININE 0.90 0.69 0.62  GLUCOSE 167* 136* 137*   Recent Labs  Lab 05/01/19 0517 05/02/19 0533 05/03/19 0432  HGB 10.7* 9.7* 9.9*  HCT 32.5* 29.4* 29.2*  WBC 3.9* 5.4 5.8  PLT 187 176 153     IMAGING RESULTS: No results found.    ASSESSMENT AND PLAN    -Multidisciplinary rounds held today  Acute on chronic high-volume intractable diarrhea - resolved post steroids -She does not appear to be acutely toxic/infected appearing, C. difficile is negative -Supportive care with IV fluid rehydration   Atrial fibrillation -Lopressor 50 p.o. 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 Gilberto Better as an outpatient -Every 2 weeks Xolair 375 IV -Currently on prednisone 40 po daily    GI/Nutrition GI PROPHYLAXIS as indicated DIET-->TF's  as tolerated Constipation protocol as indicated  ENDO - ICU hypoglycemic\Hyperglycemia protocol -check FSBS per protocol   ELECTROLYTES -follow labs as needed -replace as needed -pharmacy consultation   DVT/GI PRX ordered -SCDs  TRANSFUSIONS AS NEEDED MONITOR FSBS ASSESS the need for LABS as needed    This document was prepared using Dragon voice recognition software and may include unintentional dictation errors.    Ottie Glazier, M.D.  Division of [place]

## 2019-05-03 NOTE — Progress Notes (Signed)
[facility] Gastroenterology Inpatient Progress Note  Subjective: Patient seen for follow-up diarrhea, presumably from collagenous colitis.  Patient reports 4 loose stools yesterday without any episodes of urgency or incontinence.  No blood is noted in the stool.  Patient denies abdominal pain.  Spirits are overall improved and she says she feels "clearly better".  Objective: Vital signs in last 24 hours: Temp:  [97.7 F (36.5 C)-98 F (36.7 C)] 97.7 F (36.5 C) (07/28 0849) Pulse Rate:  [66-72] 66 (07/28 0849) Resp:  [20-24] 20 (07/28 0849) BP: (133-138)/(59-62) 133/62 (07/28 0849) SpO2:  [95 %-98 %] 98 % (07/28 0849) Weight:  [108.6 kg] 108.6 kg (07/28 0500) Blood pressure 133/62, pulse 66, temperature 97.7 F (36.5 C), resp. rate 20, height 5\' 7"  (1.702 m), weight 108.6 kg, SpO2 98 %.    Intake/Output from previous day: 07/27 0701 - 07/28 0700 In: 2940 [P.O.:2340; I.V.:600] Out: 60 [Urine:60]  Intake/Output this shift: No intake/output data recorded.   General appearance: Alert, no acute distress Resp: Clear to auscultation Cardio: Regular rate no gallop GI: Soft, nontender.  Bowel sounds positive Extremities: No edema   Lab Results: Results for orders placed or performed during the hospital encounter of 04/30/19 (from the past 24 hour(s))  Basic metabolic panel     Status: Abnormal   Collection Time: 05/03/19  4:32 AM  Result Value Ref Range   Sodium 137 135 - 145 mmol/L   Potassium 3.9 3.5 - 5.1 mmol/L   Chloride 110 98 - 111 mmol/L   CO2 22 22 - 32 mmol/L   Glucose, Bld 137 (H) 70 - 99 mg/dL   BUN 13 8 - 23 mg/dL   Creatinine, Ser 0.62 0.44 - 1.00 mg/dL   Calcium 8.7 (L) 8.9 - 10.3 mg/dL   GFR calc non Af Amer >60 >60 mL/min   GFR calc Af Amer >60 >60 mL/min   Anion gap 5 5 - 15  CBC with Differential/Platelet     Status: Abnormal   Collection Time: 05/03/19  4:32 AM  Result Value Ref Range   WBC 5.8 4.0 - 10.5 K/uL   RBC 3.20 (L) 3.87 - 5.11 MIL/uL   Hemoglobin 9.9 (L) 12.0 - 15.0 g/dL   HCT 29.2 (L) 36.0 - 46.0 %   MCV 91.3 80.0 - 100.0 fL   MCH 30.9 26.0 - 34.0 pg   MCHC 33.9 30.0 - 36.0 g/dL   RDW 12.9 11.5 - 15.5 %   Platelets 153 150 - 400 K/uL   nRBC 0.0 0.0 - 0.2 %   Neutrophils Relative % 68 %   Neutro Abs 3.9 1.7 - 7.7 K/uL   Lymphocytes Relative 20 %   Lymphs Abs 1.2 0.7 - 4.0 K/uL   Monocytes Relative 10 %   Monocytes Absolute 0.6 0.1 - 1.0 K/uL   Eosinophils Relative 0 %   Eosinophils Absolute 0.0 0.0 - 0.5 K/uL   Basophils Relative 0 %   Basophils Absolute 0.0 0.0 - 0.1 K/uL   Immature Granulocytes 2 %   Abs Immature Granulocytes 0.11 (H) 0.00 - 0.07 K/uL     Recent Labs    05/01/19 0517 05/02/19 0533 05/03/19 0432  WBC 3.9* 5.4 5.8  HGB 10.7* 9.7* 9.9*  HCT 32.5* 29.4* 29.2*  PLT 187 176 153   BMET Recent Labs    05/01/19 0517 05/02/19 0533 05/03/19 0432  NA 133* 135 137  K 3.7 3.4* 3.9  CL 106 109 110  CO2 20* 22 22  GLUCOSE 167* 136* 137*  BUN 19 15 13   CREATININE 0.90 0.69 0.62  CALCIUM 8.5* 8.6* 8.7*   LFT No results for input(s): PROT, ALBUMIN, AST, ALT, ALKPHOS, BILITOT, BILIDIR, IBILI in the last 72 hours. PT/INR No results for input(s): LABPROT, INR in the last 72 hours. Hepatitis Panel No results for input(s): HEPBSAG, HCVAB, HEPAIGM, HEPBIGM in the last 72 hours. C-Diff No results for input(s): CDIFFTOX in the last 72 hours. No results for input(s): CDIFFPCR in the last 72 hours.   Studies/Results: No results found.  Scheduled Inpatient Medications:   . ALPRAZolam  0.25 mg Oral BID  . ALPRAZolam  0.5 mg Oral QHS  . Chlorhexidine Gluconate Cloth  6 each Topical Daily  . enoxaparin (LOVENOX) injection  40 mg Subcutaneous QHS  . feeding supplement  1 Container Oral TID WC  . heparin  5,000 Units Subcutaneous Q8H  . insulin aspart  0-9 Units Subcutaneous TID WC  . memantine  5 mg Oral BID  . methylPREDNISolone (SOLU-MEDROL) injection  60 mg Intravenous Daily  . metoprolol  tartrate  50 mg Oral BID  . prazosin  1 mg Oral QHS  . rosuvastatin  40 mg Oral q1800  . sertraline  150 mg Oral QHS    Continuous Inpatient Infusions:    PRN Inpatient Medications:  acetaminophen **OR** acetaminophen, Melatonin, ondansetron **OR** ondansetron (ZOFRAN) IV    Assessment:  1. Collagenous colitis. Diarrhea Improved somewhat as noted above.   Plan:  1.  Transition to po steroids. 2. Diet as tolerated. 3. Following.  Jameal Razzano K. Alice Reichert, M.D. 05/03/2019, 12:12 PM

## 2019-05-03 NOTE — Progress Notes (Signed)
Pharmacy Electrolyte Monitoring Consult:  Pharmacy consulted to assist in monitoring and replacing electrolytes in this 73 y.o. female admitted on 04/30/2019. Patient with collagenous colitis.   Labs:  Sodium (mmol/L)  Date Value  05/03/2019 137  12/30/2014 134 (L)   Potassium (mmol/L)  Date Value  05/03/2019 3.9  12/30/2014 3.2 (L)   Magnesium (mg/dL)  Date Value  05/02/2019 1.9   Phosphorus (mg/dL)  Date Value  05/02/2019 2.6   Calcium (mg/dL)  Date Value  05/03/2019 8.7 (L)   Calcium, Total (mg/dL)  Date Value  12/30/2014 8.9   Albumin (g/dL)  Date Value  04/30/2019 2.6 (L)  01/07/2013 3.4    Assessment/Plan: MIVF stopped during AM ICU Rounds. Patient is intolerant to oral potassium replacement.   BMP/Magnesium/Phosphorus with am labs.   Will replace to maintain electrolytes within normal limits.   Pharmacy will continue to monitor and adjust per consult.   Simpson,Michael L 05/03/2019 9:20 AM

## 2019-05-03 NOTE — Progress Notes (Signed)
Physical Therapy Treatment Patient Details Name: Caitlin Leonard MRN: [id number] DOB: 1946/08/19 Today's Date: 05/03/2019    History of Present Illness Pt admitted for diarrhea. Recent admission for collagenous colitis. History includes anemia, COPD, depression, and GERD.     PT Comments    Pt making gradual progress towards goals with increased strength noted this session. Able to tolerate resisted there-ex this date. Reports she has been ambulatory with RN staff. Will continue to progress. She is hopeful for dc to home tomorrow.  Follow Up Recommendations  Home health PT     Equipment Recommendations  None recommended by PT    Recommendations for Other Services       Precautions / Restrictions Precautions Precautions: Fall Restrictions Weight Bearing Restrictions: No    Mobility  Bed Mobility               General bed mobility comments: refuses this date as she has been up several times this date  Transfers                    Ambulation/Gait                 Stairs             Wheelchair Mobility    Modified Rankin (Stroke Patients Only)       Balance                                            Cognition Arousal/Alertness: Awake/alert Behavior During Therapy: WFL for tasks assessed/performed Overall Cognitive Status: Within Functional Limits for tasks assessed                                        Exercises Other Exercises Other Exercises: supine ther-ex performed on B LEs including SLRs, hip add squeezes, resisted knee extension, hip abd/add, quad sets, glut sets x 12 reps with cga.    General Comments        Pertinent Vitals/Pain Pain Assessment: No/denies pain    Home Living                      Prior Function            PT Goals (current goals can now be found in the care plan section) Acute Rehab PT Goals Patient Stated Goal: to go back to [facility] PT Goal  Formulation: With patient Time For Goal Achievement: 05/16/19 Potential to Achieve Goals: Good Progress towards PT goals: Progressing toward goals    Frequency    Min 2X/week      PT Plan Current plan remains appropriate    Co-evaluation              AM-PAC PT "6 Clicks" Mobility   Outcome Measure  Help needed turning from your back to your side while in a flat bed without using bedrails?: None Help needed moving from lying on your back to sitting on the side of a flat bed without using bedrails?: A Little Help needed moving to and from a bed to a chair (including a wheelchair)?: A Little Help needed standing up from a chair using your arms (e.g., wheelchair or bedside chair)?: A Little Help needed to walk in hospital room?: A  Little Help needed climbing 3-5 steps with a railing? : A Lot 6 Click Score: 18    End of Session Equipment Utilized During Treatment: Gait belt Activity Tolerance: Patient tolerated treatment well Patient left: in bed Nurse Communication: Mobility status PT Visit Diagnosis: Muscle weakness (generalized) (M62.81);History of falling (Z91.81);Repeated falls (R29.6);Difficulty in walking, not elsewhere classified (R26.2)     Time: 1594-7076 PT Time Calculation (min) (ACUTE ONLY): 14 min  Charges:  $Therapeutic Exercise: 8-22 mins                     Caitlin Leonard, PT, DPT [phone]    Caitlin Leonard 05/03/2019, 4:25 PM

## 2019-05-03 NOTE — Progress Notes (Signed)
1700 Dr. Dani Gobble called and updated on wife's day and her progress.

## 2019-05-03 NOTE — Progress Notes (Signed)
Morning stool has more consistency . Patient clearly more satisfied with herself. No distraught calls from husband. Dicussed triggers for episodes of diarrhea./ Per dietician- Leeann- There are no diet triggers. Her colitis is more on a cellular level and has no food triggers. Stress and overall nutrition contribute to her diarrhea. Patient must have and keep high protein diet and overall good nutrition to deal with collagenous colitis.Patient states she feels better about her life and diagnosis.

## 2019-05-03 NOTE — Progress Notes (Signed)
[place] at [place] NAME: Caitlin Leonard    MR#:  [id number]  DATE OF BIRTH:  14-Jan-1946  .  CHIEF COMPLAINT:   Chief Complaint  Patient presents with  . Abdominal Pain   Diarrhea improving No abd pain or vomiting Worked with PT  REVIEW OF SYSTEMS:   ROS CONSTITUTIONAL: Generalized weakness. EYES: No blurred or double vision.  EARS, NOSE, AND THROAT: No tinnitus or ear pain.  RESPIRATORY: No cough, shortness of breath, wheezing or hemoptysis.  CARDIOVASCULAR: No chest pain, orthopnea, edema.  GASTROINTESTINAL: Diarrhea, abdominal pain  GENITOURINARY: No dysuria, hematuria.  ENDOCRINE: No polyuria, nocturia,  HEMATOLOGY: No anemia, easy bruising or bleeding SKIN: No rash or lesion. MUSCULOSKELETAL: No joint pain or arthritis.   NEUROLOGIC: No tingling, numbness, weakness.  PSYCHIATRY: Has anxiety DRUG ALLERGIES:   Allergies  Allergen Reactions  . Contrast Media [Iodinated Diagnostic Agents] Anaphylaxis  . Gadolinium Derivatives Anaphylaxis  . Gadoversetamide Anaphylaxis  . Shellfish Allergy Nausea And Vomiting and Other (See Comments)    Can eat shrimp but not oysters Dizziness,severe nausea and vomiting ( can eat shrimp CAN NOT EAT ORYSTERS) Other reaction(s): Other (See Comments) Dizziness,severe nausea and vomiting ( can eat shrimp CAN NOT EAT ORYSTERS)  Can eat shrimp but not oysters Dizziness,severe nausea and vomiting ( can eat shrimp CAN NOT EAT ORYSTERS)  Can eat shrimp but not oysters   . Sulfa Antibiotics Hives and Other (See Comments)    GI upset GI upset GI upset  GI upset GI upset GI upset Other reaction(s): Other (See Comments) GI upset GI upset  GI upset GI upset   . Hydroxychloroquine Sulfate Hives and Other (See Comments)    Severe hives, all over like chicken pox.  . Banana Nausea And Vomiting and Other (See Comments)    Raw bananas  . Limonene Other (See Comments)    GI upset GI  upset   . Morphine And Related   . Nabumetone Other (See Comments)    Hypertension  . Otezla [Apremilast]   . Potassium-Containing Compounds Other (See Comments)    GI upset  . Prednisone Nausea And Vomiting  . Sulfasalazine Hives and Other (See Comments)    GI upset  . Sulfonamide Derivatives Hives  . Metronidazole Rash  . Sulfamethoxazole Rash    VITALS:  Blood pressure 128/77, pulse 63, temperature 98.5 F (36.9 C), resp. rate 15, height 5\' 7"  (1.702 m), weight 108.6 kg, SpO2 100 %.  PHYSICAL EXAMINATION:  GENERAL:  73 y.o.-year-old patient lying in the bed with no acute distress.  EYES: Pupils equal, round, reactive to light  No scleral icterus. Extraocular muscles intact.  HEENT: Head atraumatic, normocephalic. Oropharynx and nasopharynx clear.  NECK:  Supple, no jugular venous distention. No thyroid enlargement, no tenderness.  LUNGS: Normal breath sounds bilaterally, no wheezing, rales,rhonchi or crepitation. No use of accessory muscles of respiration.  CARDIOVASCULAR: S1, S2 normal. No murmurs, rubs, or gallops.  ABDOMEN: Soft, nontender, nondistended. Bowel sounds present. No organomegaly or mass.  Rectal tube present EXTREMITIES: No pedal edema, cyanosis, or clubbing.  NEUROLOGIC: Cranial nerves II through XII are intact. Muscle strength 5/5 in all extremities. Sensation intact. Gait not checked.  PSYCHIATRIC: The patient is alert and oriented x 3.  SKIN: No obvious rash, lesion, or ulcer.    LABORATORY PANEL:   CBC Recent Labs  Lab 05/03/19 0432  WBC 5.8  HGB 9.9*  HCT 29.2*  PLT 153   ------------------------------------------------------------------------------------------------------------------  Chemistries  Recent Labs  Lab 04/30/19 0542  05/02/19 0533 05/03/19 0432  NA 130*   < > 135 137  K 3.2*   < > 3.4* 3.9  CL 97*   < > 109 110  CO2 21*   < > 22 22  GLUCOSE 163*   < > 136* 137*  BUN 26*   < > 15 13  CREATININE 1.76*   < > 0.69 0.62   CALCIUM 9.3   < > 8.6* 8.7*  MG  --    < > 1.9  --   AST 18  --   --   --   ALT 24  --   --   --   ALKPHOS 80  --   --   --   BILITOT 0.8  --   --   --    < > = values in this interval not displayed.   ------------------------------------------------------------------------------------------------------------------  Cardiac Enzymes No results for input(s): TROPONINI in the last 168 hours. ------------------------------------------------------------------------------------------------------------------  RADIOLOGY:  No results found.  EKG:   Orders placed or performed during the hospital encounter of 04/30/19  . EKG 12-Lead  . EKG 12-Lead    ASSESSMENT AND PLAN:   73 year old female with history of recently diagnosed collagenous colitis, started on budesonide, discharged home recently comes back because of profuse diarrhea, admitted for hypotension with acute kidney injury due to GI losses.  *Collagenous colitis No signs of infection on recent colonoscopy and stool studies Steroids IV --> PO.  Appreciate GI input. Improving well  *Hypovolemic shock on admission secondary to severe diarrhea. Responded to IV fluids.  Briefly needed pressor support.  Blood pressure improved.  *Acute kidney injury secondary to ATN from hypotension. Improving with IV fluids.  * Anxiety depression, continue Xanax  Zoloft.   *  Essential hypertension, restarted small dose beta-blocker continue to hold diuretics.  All the records are reviewed and case discussed with Care Management/Social Worker Management plans discussed with the patient, family and they are in agreement.  CODE STATUS: DNR  TOTAL TIME TAKING CARE OF THIS PATIENT: 35 minutes.   Likely d/c home tomorrow  Neita Carp M.D on 05/03/2019 at 3:09 PM  Between 7am to 6pm - Pager - [phone]  After 6pm go to [email or web address] - password [email or web address] [facility]  Office  [phone]  CC: Primary care  physician; [place] Pink, MD   Note: This dictation was prepared with Dragon dictation along with smaller phrase technology. Any transcriptional errors that result from this process are unintentional.

## 2019-05-03 NOTE — Progress Notes (Signed)
Report called to Danielle on 1C and patient moved to 1C via bed. Husband called and informed of move to different room.

## 2019-05-04 LAB — GASTROINTESTINAL PANEL BY PCR, STOOL (REPLACES STOOL CULTURE)

## 2019-05-04 LAB — BASIC METABOLIC PANEL
Anion gap: 8 (ref 5–15)
BUN: 11 mg/dL (ref 8–23)
CO2: 23 mmol/L (ref 22–32)
Calcium: 9.1 mg/dL (ref 8.9–10.3)
Chloride: 106 mmol/L (ref 98–111)
Creatinine, Ser: 0.62 mg/dL (ref 0.44–1.00)
GFR calc Af Amer: >60 mL/min (ref >60)
GFR calc non Af Amer: >60 mL/min (ref >60)
Glucose, Bld: 118 mg/dL — ABNORMAL HIGH (ref 70–99)
Potassium: 3.9 mmol/L (ref 3.5–5.1)
Sodium: 137 mmol/L (ref 135–145)

## 2019-05-04 LAB — GLUCOSE, CAPILLARY
Glucose-Capillary: 127 mg/dL — ABNORMAL HIGH (ref 70–99)
Glucose-Capillary: 193 mg/dL — ABNORMAL HIGH (ref 70–99)
Glucose-Capillary: 177 mg/dL — ABNORMAL HIGH (ref 70–99)
Glucose-Capillary: 213 mg/dL — ABNORMAL HIGH (ref 70–99)

## 2019-05-04 LAB — PHOSPHORUS: Phosphorus: 2 mg/dL — ABNORMAL LOW (ref 2.5–4.6)

## 2019-05-04 LAB — MAGNESIUM: Magnesium: 1.8 mg/dL (ref 1.7–2.4)

## 2019-05-04 MED ORDER — LOPERAMIDE HCL 2 MG PO CAPS: 2.0000 mg | ORAL_CAPSULE | Freq: Four times a day (QID) | ORAL | Status: DC | PRN

## 2019-05-04 MED ORDER — LOPERAMIDE HCL 2 MG PO CAPS: 2.0000 mg | ORAL_CAPSULE | Freq: Four times a day (QID) | ORAL | 0 refills | Status: DC | PRN

## 2019-05-04 MED ORDER — PREDNISONE 10 MG PO TABS: 30.0000 mg | ORAL_TABLET | Freq: Every day | ORAL | 0 refills | Status: DC

## 2019-05-04 MED ORDER — K PHOS MONO-SOD PHOS DI & MONO 155-852-130 MG PO TABS
500.0000 mg | ORAL_TABLET | Freq: Once | ORAL | Status: AC
  Administered 2019-05-04: 12:00:00 500 mg via ORAL
  Filled 2019-05-04: qty 2

## 2019-05-04 MED ORDER — LOPERAMIDE HCL 2 MG PO CAPS
4.0000 mg | ORAL_CAPSULE | Freq: Once | ORAL | Status: AC
  Administered 2019-05-04: 14:00:00 4 mg via ORAL
  Filled 2019-05-04: qty 2

## 2019-05-04 NOTE — Discharge Instructions (Signed)
Resume diet and activity as before ° ° °

## 2019-05-04 NOTE — Progress Notes (Addendum)
Pharmacy Electrolyte Monitoring Consult:  Pharmacy consulted to assist in monitoring and replacing electrolytes in this 73 y.o. female admitted on 04/30/2019. Patient with collagenous colitis.   Labs:  Sodium (mmol/L)  Date Value  05/04/2019 137  12/30/2014 134 (L)   Potassium (mmol/L)  Date Value  05/04/2019 3.9  12/30/2014 3.2 (L)   Magnesium (mg/dL)  Date Value  05/04/2019 1.8   Phosphorus (mg/dL)  Date Value  05/04/2019 2.0 (L)   Calcium (mg/dL)  Date Value  05/04/2019 9.1   Calcium, Total (mg/dL)  Date Value  12/30/2014 8.9   Albumin (g/dL)  Date Value  04/30/2019 2.6 (L)  01/07/2013 3.4    Assessment/Plan: Phos slightly low. Will give K Phos Neutral 500 mg PO x 1 dose.  Pharmacy will continue to monitor and adjust per consult.   Tawnya Crook, PharmD Clinical Pharmacist 05/04/2019 11:50 AM

## 2019-05-04 NOTE — Care Management Important Message (Signed)
Important Message  Patient Details  Name: Caitlin Leonard MRN: [id number] Date of Birth: Jan 18, 1946   Medicare Important Message Given:  Yes     Juliann Pulse A Jerrie Gullo 05/04/2019, 12:43 PM

## 2019-05-05 LAB — CULTURE, BLOOD (ROUTINE X 2)
Culture: NO GROWTH
Culture: NO GROWTH
Special Requests: ADEQUATE

## 2019-05-05 LAB — BASIC METABOLIC PANEL
Anion gap: 6 (ref 5–15)
BUN: 13 mg/dL (ref 8–23)
CO2: 27 mmol/L (ref 22–32)
Calcium: 8.8 mg/dL — ABNORMAL LOW (ref 8.9–10.3)
Chloride: 104 mmol/L (ref 98–111)
Creatinine, Ser: 0.58 mg/dL (ref 0.44–1.00)
GFR calc Af Amer: >60 mL/min (ref >60)
GFR calc non Af Amer: >60 mL/min (ref >60)
Glucose, Bld: 146 mg/dL — ABNORMAL HIGH (ref 70–99)
Potassium: 3.3 mmol/L — ABNORMAL LOW (ref 3.5–5.1)
Sodium: 137 mmol/L (ref 135–145)

## 2019-05-05 LAB — GLUCOSE, CAPILLARY
Glucose-Capillary: 108 mg/dL — ABNORMAL HIGH (ref 70–99)
Glucose-Capillary: 174 mg/dL — ABNORMAL HIGH (ref 70–99)

## 2019-05-05 LAB — PHOSPHORUS: Phosphorus: 2.9 mg/dL (ref 2.5–4.6)

## 2019-05-05 LAB — OSMOLALITY, STOOL: Osmolality,Stl: 424 mOsmol/kg

## 2019-05-05 MED ORDER — LOPERAMIDE HCL 2 MG PO CAPS
4.0000 mg | ORAL_CAPSULE | Freq: Four times a day (QID) | ORAL | Status: DC | PRN
  Administered 2019-05-05: 11:00:00 4 mg via ORAL
  Filled 2019-05-05: qty 2

## 2019-05-05 MED ORDER — POTASSIUM CHLORIDE CRYS ER 20 MEQ PO TBCR: 40.0000 meq | EXTENDED_RELEASE_TABLET | Freq: Once | ORAL | Status: DC

## 2019-05-05 NOTE — Progress Notes (Signed)
[place] at [place] NAME: Caitlin Leonard    MR#:  [id number]  DATE OF BIRTH:  05-15-1946  .  CHIEF COMPLAINT:   Chief Complaint  Patient presents with  . Abdominal Pain   Had 5 episodes diarrhea today Afebrile  REVIEW OF SYSTEMS:   ROS CONSTITUTIONAL: Generalized weakness. EYES: No blurred or double vision.  EARS, NOSE, AND THROAT: No tinnitus or ear pain.  RESPIRATORY: No cough, shortness of breath, wheezing or hemoptysis.  CARDIOVASCULAR: No chest pain, orthopnea, edema.  GASTROINTESTINAL: Diarrhea, abdominal pain  GENITOURINARY: No dysuria, hematuria.  ENDOCRINE: No polyuria, nocturia,  HEMATOLOGY: No anemia, easy bruising or bleeding SKIN: No rash or lesion. MUSCULOSKELETAL: No joint pain or arthritis.   NEUROLOGIC: No tingling, numbness, weakness.  PSYCHIATRY: Has anxiety DRUG ALLERGIES:   Allergies  Allergen Reactions  . Contrast Media [Iodinated Diagnostic Agents] Anaphylaxis  . Gadolinium Derivatives Anaphylaxis  . Gadoversetamide Anaphylaxis  . Shellfish Allergy Nausea And Vomiting and Other (See Comments)    Can eat shrimp but not oysters Dizziness,severe nausea and vomiting ( can eat shrimp CAN NOT EAT ORYSTERS) Other reaction(s): Other (See Comments) Dizziness,severe nausea and vomiting ( can eat shrimp CAN NOT EAT ORYSTERS)  Can eat shrimp but not oysters Dizziness,severe nausea and vomiting ( can eat shrimp CAN NOT EAT ORYSTERS)  Can eat shrimp but not oysters   . Sulfa Antibiotics Hives and Other (See Comments)    GI upset GI upset GI upset  GI upset GI upset GI upset Other reaction(s): Other (See Comments) GI upset GI upset  GI upset GI upset   . Hydroxychloroquine Sulfate Hives and Other (See Comments)    Severe hives, all over like chicken pox.  . Banana Nausea And Vomiting and Other (See Comments)    Raw bananas  . Limonene Other (See Comments)    GI upset GI upset   . Morphine  And Related   . Nabumetone Other (See Comments)    Hypertension  . Otezla [Apremilast]   . Potassium-Containing Compounds Other (See Comments)    GI upset  . Prednisone Nausea And Vomiting  . Sulfasalazine Hives and Other (See Comments)    GI upset  . Sulfonamide Derivatives Hives  . Metronidazole Rash  . Sulfamethoxazole Rash    VITALS:  Blood pressure (!) 141/63, pulse 61, temperature 97.9 F (36.6 C), temperature source Oral, resp. rate 18, height 5\' 7"  (1.702 m), weight 107.1 kg, SpO2 93 %.  PHYSICAL EXAMINATION:  GENERAL:  73 y.o.-year-old patient lying in the bed with no acute distress.  EYES: Pupils equal, round, reactive to light  No scleral icterus. Extraocular muscles intact.  HEENT: Head atraumatic, normocephalic. Oropharynx and nasopharynx clear.  NECK:  Supple, no jugular venous distention. No thyroid enlargement, no tenderness.  LUNGS: Normal breath sounds bilaterally, no wheezing, rales,rhonchi or crepitation. No use of accessory muscles of respiration.  CARDIOVASCULAR: S1, S2 normal. No murmurs, rubs, or gallops.  ABDOMEN: Soft, nontender, nondistended. Bowel sounds present. No organomegaly or mass.  Rectal tube present EXTREMITIES: No pedal edema, cyanosis, or clubbing.  NEUROLOGIC: Cranial nerves II through XII are intact. Muscle strength 5/5 in all extremities. Sensation intact. Gait not checked.  PSYCHIATRIC: The patient is alert and oriented x 3.  SKIN: No obvious rash, lesion, or ulcer.    LABORATORY PANEL:   CBC Recent Labs  Lab 05/03/19 0432  WBC 5.8  HGB 9.9*  HCT 29.2*  PLT 153   ------------------------------------------------------------------------------------------------------------------  Chemistries  Recent Labs  Lab 04/30/19 0542  05/04/19 0454 05/05/19 0458  NA 130*   < > 137 137  K 3.2*   < > 3.9 3.3*  CL 97*   < > 106 104  CO2 21*   < > 23 27  GLUCOSE 163*   < > 118* 146*  BUN 26*   < > 11 13  CREATININE 1.76*   < > 0.62  0.58  CALCIUM 9.3   < > 9.1 8.8*  MG  --    < > 1.8  --   AST 18  --   --   --   ALT 24  --   --   --   ALKPHOS 80  --   --   --   BILITOT 0.8  --   --   --    < > = values in this interval not displayed.   ------------------------------------------------------------------------------------------------------------------  Cardiac Enzymes No results for input(s): TROPONINI in the last 168 hours. ------------------------------------------------------------------------------------------------------------------  RADIOLOGY:  No results found.  EKG:   Orders placed or performed during the hospital encounter of 04/30/19  . EKG 12-Lead  . EKG 12-Lead    ASSESSMENT AND PLAN:   73 year old female with history of recently diagnosed collagenous colitis, started on budesonide, discharged home recently comes back because of profuse diarrhea, admitted for hypotension with acute kidney injury due to GI losses.  *Collagenous colitis No signs of infection on recent colonoscopy and stool studies Steroids IV --> PO.  Appreciate GI input. Improving well Still with diarrhea and can get dehydrated at home Start Imodium  *Hypovolemic shock on admission secondary to severe diarrhea. Responded to IV fluids.  Briefly needed pressor support.  Blood pressure improved.  *Acute kidney injury secondary to ATN from hypotension. Resolved  * Anxiety depression, continue Xanax  Zoloft.   *  Essential hypertension, restarted small dose beta-blocker continue to hold diuretics.  All the records are reviewed and case discussed with Care Management/Social Worker Management plans discussed with the patient, family and they are in agreement.  CODE STATUS: DNR  TOTAL TIME TAKING CARE OF THIS PATIENT: 35 minutes.   Likely d/c home tomorrow  Neita Carp M.D on 05/05/2019 at 8:25 AM  Between 7am to 6pm - Pager - [phone]  After 6pm go to [email or web address] - password [email or web address]  Office  [phone]  CC: Primary care physician; [place] Pink, MD   Note: This dictation was prepared with Dragon dictation along with smaller phrase technology. Any transcriptional errors that result from this process are unintentional.

## 2019-05-05 NOTE — Progress Notes (Signed)
Pt d/c to [facility] by husband. IVs removed intact. VSS. Education completed. All belongings sent with pt.

## 2019-05-05 NOTE — TOC Transition Note (Signed)
Transition of Care [facility]) - CM/SW Discharge Note   Patient Details  Name: Caitlin Leonard MRN: [id number] Date of Birth: Sep 18, 1946  Transition of Care [facility] CM/SW Contact:  Ikechukwu Cerny, Lenice Llamas Phone Number: [phone]  05/05/2019, 4:37 PM   Clinical Narrative:  Patient will D/C home today back to [facility] independent living. Per patient she is already getting PT through [facility] and they are coming to her home. CSW gave patient outpatient PT and OT order from MD to give to [facility]. Per patient her husband will transport her home today. [facility] admissions coordinator at [facility] is aware of above. Please reconsult if future social work needs arise. CSW signing off.     Final next level of care: OP Rehab Barriers to Discharge: Barriers Resolved   Patient Goals and CMS Choice     Choice offered to / list presented to : NA  Discharge Placement                       Discharge Plan and Services     Post Acute Care Choice: Home Health          DME Arranged: N/A         HH Arranged: PT, OT [place] Agency: Other - See comment(Outpatient therapy through [facility]. OT comes to her home.) Date [place]: 05/02/19   Representative spoke with at [place]: [name] [facility] admissions coordinator)  Social Determinants of Health (SDOH) Interventions     Readmission Risk Interventions Readmission Risk Prevention Plan 05/02/2019  [place] or Home Care Consult Complete  Social Work Consult for [place] Planning/Counseling Complete  Palliative Care Screening Not Applicable  Some recent data might be hidden

## 2019-05-05 NOTE — Progress Notes (Signed)
Diagnosis - colitis, arthritis  Outpatient physical therapy and occupational therapy evaluation and treatment.  Hillary Bow MD

## 2019-05-05 NOTE — Progress Notes (Signed)
Physical Therapy Treatment Patient Details Name: Caitlin Leonard MRN: [id number] DOB: 1945-10-20 Today's Date: 05/05/2019    History of Present Illness Pt admitted for diarrhea. Recent admission for collagenous colitis. History includes anemia, COPD, depression, and GERD.     PT Comments    Pt is making good progress towards goals with improved gait distance this session. Improved balance and strength noted. Fatigues quickly with decreased endurance noted. Will continue to progress.   Follow Up Recommendations  Home health PT     Equipment Recommendations  None recommended by PT    Recommendations for Other Services       Precautions / Restrictions Precautions Precautions: Fall Restrictions Weight Bearing Restrictions: No    Mobility  Bed Mobility               General bed mobility comments: not performed as pt received on BSC  Transfers Overall transfer level: Needs assistance Equipment used: Rolling walker (2 wheeled) Transfers: Sit to/from Stand Sit to Stand: Supervision         General transfer comment: safe technique.  Ambulation/Gait Ambulation/Gait assistance: Min guard Gait Distance (Feet): 80 Feet Assistive device: Rolling walker (2 wheeled) Gait Pattern/deviations: Step-through pattern     General Gait Details: slow speed, HR increased to 127bpm with exertion. Fatigues. Chair follow, however did not require break.   Stairs             Wheelchair Mobility    Modified Rankin (Stroke Patients Only)       Balance Overall balance assessment: Needs assistance;History of Falls Sitting-balance support: Feet supported;Bilateral upper extremity supported Sitting balance-Leahy Scale: Good     Standing balance support: Bilateral upper extremity supported Standing balance-Leahy Scale: Good                              Cognition Arousal/Alertness: Awake/alert Behavior During Therapy: WFL for tasks  assessed/performed Overall Cognitive Status: Within Functional Limits for tasks assessed                                        Exercises Other Exercises Other Exercises: supine ther-ex performed including B LE AP and SLRs x 15 reps with supervision    General Comments        Pertinent Vitals/Pain Pain Assessment: No/denies pain    Home Living                      Prior Function            PT Goals (current goals can now be found in the care plan section) Acute Rehab PT Goals Patient Stated Goal: to go back to [facility] PT Goal Formulation: With patient Time For Goal Achievement: 05/16/19 Potential to Achieve Goals: Good Progress towards PT goals: Progressing toward goals    Frequency    Min 2X/week      PT Plan Current plan remains appropriate    Co-evaluation              AM-PAC PT "6 Clicks" Mobility   Outcome Measure  Help needed turning from your back to your side while in a flat bed without using bedrails?: None Help needed moving from lying on your back to sitting on the side of a flat bed without using bedrails?: None Help needed moving to and from a  bed to a chair (including a wheelchair)?: A Little Help needed standing up from a chair using your arms (e.g., wheelchair or bedside chair)?: A Little Help needed to walk in hospital room?: A Little Help needed climbing 3-5 steps with a railing? : A Little 6 Click Score: 20    End of Session Equipment Utilized During Treatment: Gait belt Activity Tolerance: Patient tolerated treatment well Patient left: in chair;with chair alarm set Nurse Communication: Mobility status PT Visit Diagnosis: Muscle weakness (generalized) (M62.81);History of falling (Z91.81);Repeated falls (R29.6);Difficulty in walking, not elsewhere classified (R26.2)     Time: 4471-5806 PT Time Calculation (min) (ACUTE ONLY): 23 min  Charges:  $Gait Training: 23-37 mins                     Greggory Stallion, [place], DPT [phone]    Debra Calabretta 05/05/2019, 10:47 AM

## 2019-05-06 NOTE — Discharge Summary (Signed)
[facility] [place] NAME: Caitlin Leonard    MR#:  [id number]  DATE OF BIRTH:  05-11-1946  DATE OF ADMISSION:  04/30/2019 ADMITTING PHYSICIAN: Epifanio Lesches, MD  DATE OF DISCHARGE: 05/05/2019  5:18 PM  PRIMARY CARE PHYSICIAN: Maryland Pink, MD   ADMISSION DIAGNOSIS:  Dehydration [E86.0] Generalized abdominal pain [R10.84] Nausea vomiting and diarrhea [R11.2, R19.7] Hypotension, unspecified hypotension type [I95.9] Septic shock ([place]) [A41.9, R65.21]  DISCHARGE DIAGNOSIS:  Active Problems:   Diarrhea   Septic shock ([place])  SECONDARY DIAGNOSIS:   Past Medical History:  Diagnosis Date  . Anemia   . Anxiety   . Arthritis    osteoarthritis  . Asthma    due to seasonal alllergies  . Atrial fibrillation ([place])   . Carpal tunnel syndrome   . Cataract   . Cervicalgia   . Collagenous colitis 2010  . COPD (chronic obstructive pulmonary disease) ([place])   . Depression    situational  . Diabetes mellitus without complication ([place])   . Dysrhythmia   . GERD (gastroesophageal reflux disease)   . GI bleed   . Headache(784.0)    migraines; 2 x month  . Heart murmur    MVP ( symptomatic)  . History of IBS   . Hyperlipemia   . Hypertension    on medication x 10 years  . Migraine   . MVP (mitral valve prolapse)   . Shortness of breath    exertional  . Sinoatrial node dysfunction (H[facility])   . Sleep apnea 2007   does not use CPAP since 40 # wt loss  . Stroke ([place])   . Syncopal episodes      ADMITTING HISTORY  HISTORY OF PRESENT ILLNESS:  Caitlin Leonard  is a 73 y.o. female with a known history of recently diagnosed collagenous colitis, having diarrhea for 4 weeks now getting worse and she has constant diarrhea, unable to keep anything down.  Complains of lower abdominal pain recently.  Patient was seen in the emergency room 3 days ago, at that time she had a CT of abdomen which was reassuring and discharged back to [facility] but comes  back again today because of persistent diarrhea and she is feeling very weak.  Patient was admitted recently from July' July 5 for same problem and found to have collagenous colitis by colonoscopy that was done recently.  Patient has large volume diarrhea is going on for 4 weeks.  In the ER patient is hypotensive, so far received about 1 and half liters of IV fluids, blood work showed elevated lactic acid, hypokalemia, acute kidney injury patient is still hypotensive.  Tried Imodium without any relief.  Discharge last time with budesonide 9 mg daily, scheduled Imodium but patient says that she has profuse diarrhea without symptom relief even though she is taking budesonide and also Imodium.  Because of hypotension, tachycardia patient will be admitted to stepdown for close monitoring, will start the pressors.  Patient also needs rectal tube as she has continuous diarrhea.  Due to persistent diarrhea for 4 weeks she is not eating much and lost about 20 pounds since 4 weeks.   HOSPITAL COURSE:   73 year old female with history of recently diagnosed collagenous colitis, started on budesonide, discharged home recently comes back because of profuse diarrhea, admitted for hypotension with acute kidney injury due to GI losses.  *Collagenous colitis No signs of infection on recent colonoscopy and stool studies Steroids IV --> PO.  Appreciate GI  input. symptoms improved well. Patient had 2 episodes of soft stools on day of discharge.  Started on Imodium as needed. No signs of dehydration. Prescription given for prednisone for 15 days.  She will follow-up with Dr. Alice Reichert who will prescribe further prednisone doses. Stopped budesonide and mesalamine  *Hypovolemic shock on admission secondary to severe diarrhea. Responded to IV fluids.  Briefly needed pressor support.  Blood pressure improved. Stopped hydrochlorothiazide at discharge  *Acute kidney injury secondary to ATN from hypotension. Resolved  *  Anxiety depression, continue Xanax  Zoloft.   *  Essential hypertension Restarted medications.  Hold hydrochlorothiazide.  Stable for discharge home.  CONSULTS OBTAINED:  Treatment Team:  Efrain Sella, MD  DRUG ALLERGIES:   Allergies  Allergen Reactions  . Contrast Media [Iodinated Diagnostic Agents] Anaphylaxis  . Gadolinium Derivatives Anaphylaxis  . Gadoversetamide Anaphylaxis  . Shellfish Allergy Nausea And Vomiting and Other (See Comments)    Can eat shrimp but not oysters Dizziness,severe nausea and vomiting ( can eat shrimp CAN NOT EAT ORYSTERS) Other reaction(s): Other (See Comments) Dizziness,severe nausea and vomiting ( can eat shrimp CAN NOT EAT ORYSTERS)  Can eat shrimp but not oysters Dizziness,severe nausea and vomiting ( can eat shrimp CAN NOT EAT ORYSTERS)  Can eat shrimp but not oysters   . Sulfa Antibiotics Hives and Other (See Comments)    GI upset GI upset GI upset  GI upset GI upset GI upset Other reaction(s): Other (See Comments) GI upset GI upset  GI upset GI upset   . Hydroxychloroquine Sulfate Hives and Other (See Comments)    Severe hives, all over like chicken pox.  . Banana Nausea And Vomiting and Other (See Comments)    Raw bananas  . Limonene Other (See Comments)    GI upset GI upset   . Morphine And Related   . Nabumetone Other (See Comments)    Hypertension  . Otezla [Apremilast]   . Potassium-Containing Compounds Other (See Comments)    GI upset  . Prednisone Nausea And Vomiting  . Sulfasalazine Hives and Other (See Comments)    GI upset  . Sulfonamide Derivatives Hives  . Metronidazole Rash  . Sulfamethoxazole Rash    DISCHARGE MEDICATIONS:   Allergies as of 05/05/2019      Reactions   Contrast Media [iodinated Diagnostic Agents] Anaphylaxis   Gadolinium Derivatives Anaphylaxis   Gadoversetamide Anaphylaxis   Shellfish Allergy Nausea And Vomiting, Other (See Comments)   Can eat shrimp but not  oysters Dizziness,severe nausea and vomiting ( can eat shrimp CAN NOT EAT ORYSTERS) Other reaction(s): Other (See Comments) Dizziness,severe nausea and vomiting ( can eat shrimp CAN NOT EAT ORYSTERS) Can eat shrimp but not oysters Dizziness,severe nausea and vomiting ( can eat shrimp CAN NOT EAT ORYSTERS) Can eat shrimp but not oysters   Sulfa Antibiotics Hives, Other (See Comments)   GI upset GI upset GI upset GI upset GI upset GI upset Other reaction(s): Other (See Comments) GI upset GI upset GI upset GI upset   Hydroxychloroquine Sulfate Hives, Other (See Comments)   Severe hives, all over like chicken pox.   Banana Nausea And Vomiting, Other (See Comments)   Raw bananas   Limonene Other (See Comments)   GI upset GI upset   Morphine And Related    Nabumetone Other (See Comments)   Hypertension   Otezla [apremilast]    Potassium-containing Compounds Other (See Comments)   GI upset   Prednisone Nausea And Vomiting   Sulfasalazine Hives,  Other (See Comments)   GI upset   Sulfonamide Derivatives Hives   Metronidazole Rash   Sulfamethoxazole Rash      Medication List    STOP taking these medications   budesonide 3 MG 24 hr capsule Commonly known as: ENTOCORT EC   hydrochlorothiazide 12.5 MG tablet Commonly known as: HYDRODIURIL   mesalamine 1.2 g EC tablet Commonly known as: LIALDA     TAKE these medications   albuterol 108 (90 Base) MCG/ACT inhaler Commonly known as: VENTOLIN HFA Inhale 1-2 puffs into the lungs every 6 (six) hours as needed for wheezing or shortness of breath.   ALPRAZolam 0.5 MG tablet Commonly known as: XANAX Take 0.5 mg by mouth at bedtime.   aspirin EC 81 MG tablet Take 81 mg by mouth daily.   carboxymethylcellulose 0.5 % Soln Commonly known as: REFRESH PLUS Place 1 drop into both eyes 2 (two) times daily.   cholestyramine light 4 g packet Commonly known as: PREVALITE Take 1 packet by mouth daily.   diltiazem 180 MG 24 hr  capsule Commonly known as: DILACOR XR Take 180 mg by mouth daily.   EpiPen 2-Pak 0.3 mg/0.3 mL Soaj injection Generic drug: EPINEPHrine Inject 0.3 mg into the muscle once.   hyoscyamine 0.125 MG tablet Commonly known as: LEVSIN Take 0.125 mg by mouth every 4 (four) hours as needed.   loperamide 2 MG capsule Commonly known as: IMODIUM Take 1 capsule (2 mg total) by mouth 4 (four) times daily. What changed: Another medication with the same name was added. Make sure you understand how and when to take each.   loperamide 2 MG capsule Commonly known as: IMODIUM Take 1 capsule (2 mg total) by mouth every 6 (six) hours as needed for diarrhea or loose stools. What changed: You were already taking a medication with the same name, and this prescription was added. Make sure you understand how and when to take each.   memantine 5 MG tablet Commonly known as: NAMENDA Take 5 mg by mouth 2 (two) times daily.   metoprolol tartrate 50 MG tablet Commonly known as: LOPRESSOR TAKE 1 TABLET(50 MG) BY MOUTH TWICE DAILY   ondansetron 4 MG disintegrating tablet Commonly known as: ZOFRAN-ODT Take 1 tablet (4 mg total) by mouth every 8 (eight) hours as needed for nausea or vomiting.   potassium chloride 10 MEQ tablet Commonly known as: K-DUR Take 10 mEq by mouth 2 (two) times daily.   prazosin 1 MG capsule Commonly known as: MINIPRESS Take 1 mg by mouth at bedtime.   predniSONE 10 MG tablet Commonly known as: DELTASONE Take 3 tablets (30 mg total) by mouth daily with breakfast.   RABEprazole 20 MG tablet Commonly known as: ACIPHEX Take 20 mg by mouth daily. 84minutes prior to a meal.   rosuvastatin 40 MG tablet Commonly known as: CRESTOR Take 1 tablet (40 mg total) by mouth daily.   sertraline 100 MG tablet Commonly known as: ZOLOFT Take 150 mg by mouth daily.   sucralfate 1 g tablet Commonly known as: CARAFATE Take 1 g by mouth 2 (two) times daily.   Xolair 150 MG/ML prefilled  syringe Generic drug: omalizumab Inject into the skin.       Today   VITAL SIGNS:  Blood pressure (!) 154/78, pulse 71, temperature 97.7 F (36.5 C), temperature source Oral, resp. rate 18, height 5\' 7"  (1.702 m), weight 107.1 kg, SpO2 95 %.  I/O:  No intake or output data in the 24 hours ending  05/06/19 1452  PHYSICAL EXAMINATION:  Physical Exam  GENERAL:  73 y.o.-year-old patient lying in the bed with no acute distress.  LUNGS: Normal breath sounds bilaterally, no wheezing, rales,rhonchi or crepitation. No use of accessory muscles of respiration.  CARDIOVASCULAR: S1, S2 normal. No murmurs, rubs, or gallops.  ABDOMEN: Soft, non-tender, non-distended. Bowel sounds present. No organomegaly or mass.  NEUROLOGIC: Moves all 4 extremities. PSYCHIATRIC: The patient is alert and oriented x 3.  SKIN: No obvious rash, lesion, or ulcer.   DATA REVIEW:   CBC Recent Labs  Lab 05/03/19 0432  WBC 5.8  HGB 9.9*  HCT 29.2*  PLT 153    Chemistries  Recent Labs  Lab 04/30/19 0542  05/04/19 0454 05/05/19 0458  NA 130*   < > 137 137  K 3.2*   < > 3.9 3.3*  CL 97*   < > 106 104  CO2 21*   < > 23 27  GLUCOSE 163*   < > 118* 146*  BUN 26*   < > 11 13  CREATININE 1.76*   < > 0.62 0.58  CALCIUM 9.3   < > 9.1 8.8*  MG  --    < > 1.8  --   AST 18  --   --   --   ALT 24  --   --   --   ALKPHOS 80  --   --   --   BILITOT 0.8  --   --   --    < > = values in this interval not displayed.    Cardiac Enzymes No results for input(s): TROPONINI in the last 168 hours.  Microbiology Results  Results for orders placed or performed during the hospital encounter of 04/30/19  Culture, blood (Routine x 2)     Status: None   Collection Time: 04/30/19  5:42 AM   Specimen: BLOOD  Result Value Ref Range Status   Specimen Description BLOOD LEFT ANTECUBITAL  Final   Special Requests   Final    BOTTLES DRAWN AEROBIC AND ANAEROBIC Blood Culture results may not be optimal due to an excessive  volume of blood received in culture bottles   Culture   Final    NO GROWTH 5 DAYS Performed at [facility], [address], [place], [place] [address]    Report Status 05/05/2019 FINAL  Final  Culture, blood (Routine x 2)     Status: None   Collection Time: 04/30/19  6:14 AM   Specimen: BLOOD  Result Value Ref Range Status   Specimen Description BLOOD RIGHT HAND  Final   Special Requests   Final    BOTTLES DRAWN AEROBIC AND ANAEROBIC Blood Culture adequate volume   Culture   Final    NO GROWTH 5 DAYS Performed at [facility], [address] [place], [place] [address]    Report Status 05/05/2019 FINAL  Final  SARS Coronavirus 2 (CEPHEID - Performed in [place] hospital lab), Hosp Order     Status: None   Collection Time: 04/30/19  6:15 AM   Specimen: Nasopharyngeal Swab  Result Value Ref Range Status   SARS Coronavirus 2 NEGATIVE NEGATIVE Final    Comment: (NOTE) If result is NEGATIVE SARS-CoV-2 target nucleic acids are NOT DETECTED. The SARS-CoV-2 RNA is generally detectable in upper and lower  respiratory specimens during the acute phase of infection. The lowest  concentration of SARS-CoV-2 viral copies this assay can detect is 250  copies / mL.  A negative result does not preclude SARS-CoV-2 infection  and should not be used as the sole basis for treatment or other  patient management decisions.  A negative result may occur with  improper specimen collection / handling, submission of specimen other  than nasopharyngeal swab, presence of viral mutation(s) within the  areas targeted by this assay, and inadequate number of viral copies  (<250 copies / mL). A negative result must be combined with clinical  observations, patient history, and epidemiological information. If result is POSITIVE SARS-CoV-2 target nucleic acids are DETECTED. The SARS-CoV-2 RNA is generally detectable in upper and lower  respiratory specimens dur ing the acute phase of  infection.  Positive  results are indicative of active infection with SARS-CoV-2.  Clinical  correlation with patient history and other diagnostic information is  necessary to determine patient infection status.  Positive results do  not rule out bacterial infection or co-infection with other viruses. If result is PRESUMPTIVE POSTIVE SARS-CoV-2 nucleic acids MAY BE PRESENT.   A presumptive positive result was obtained on the submitted specimen  and confirmed on repeat testing.  While 2019 novel coronavirus  (SARS-CoV-2) nucleic acids may be present in the submitted sample  additional confirmatory testing may be necessary for epidemiological  and / or clinical management purposes  to differentiate between  SARS-CoV-2 and other Sarbecovirus currently known to infect humans.  If clinically indicated additional testing with an alternate test  methodology [phone]) is advised. The SARS-CoV-2 RNA is generally  detectable in upper and lower respiratory sp ecimens during the acute  phase of infection. The expected result is Negative. Fact Sheet for Patients:  StrictlyIdeas.no Fact Sheet for Healthcare Providers: BankingDealers.co.za This test is not yet approved or cleared by the [place] FDA and has been authorized for detection and/or diagnosis of SARS-CoV-2 by FDA under an Emergency Use Authorization (EUA).  This EUA will remain in effect (meaning this test can be used) for the duration of the COVID-19 declaration under Section 564(b)(1) of the Act, 21 U.S.C. section 360bbb-3(b)(1), unless the authorization is terminated or revoked sooner. Performed at [facility], [address] [place], [place] [address]   Gastrointestinal Panel by PCR , Stool     Status: None   Collection Time: 04/30/19  8:54 AM   Specimen: Stool  Result Value Ref Range Status   Campylobacter species NOT DETECTED NOT DETECTED Final   Plesimonas  shigelloides NOT DETECTED NOT DETECTED Final   Salmonella species NOT DETECTED NOT DETECTED Final   Yersinia enterocolitica NOT DETECTED NOT DETECTED Final   Vibrio species NOT DETECTED NOT DETECTED Final   Vibrio cholerae NOT DETECTED NOT DETECTED Final   Enteroaggregative E coli (EAEC) NOT DETECTED NOT DETECTED Final   Enteropathogenic E coli (EPEC) NOT DETECTED NOT DETECTED Final   Enterotoxigenic E coli (ETEC) NOT DETECTED NOT DETECTED Final   Shiga like toxin producing E coli (STEC) NOT DETECTED NOT DETECTED Final   Shigella/Enteroinvasive E coli (EIEC) NOT DETECTED NOT DETECTED Final   Cryptosporidium NOT DETECTED NOT DETECTED Final   Cyclospora cayetanensis NOT DETECTED NOT DETECTED Final   Entamoeba histolytica NOT DETECTED NOT DETECTED Final   Giardia lamblia NOT DETECTED NOT DETECTED Final   Adenovirus F40/41 NOT DETECTED NOT DETECTED Final   Astrovirus NOT DETECTED NOT DETECTED Final   Norovirus GI/GII NOT DETECTED NOT DETECTED Final   Rotavirus A NOT DETECTED NOT DETECTED Final   Sapovirus (I, II, IV, and V) NOT DETECTED NOT DETECTED Final  Comment: Performed at [facility], [address] [place], [place] [address]  MRSA PCR Screening     Status: None   Collection Time: 04/30/19  9:14 AM   Specimen: Nasopharyngeal  Result Value Ref Range Status   MRSA by PCR NEGATIVE NEGATIVE Final    Comment:        The GeneXpert MRSA Assay (FDA approved for NASAL specimens only), is one component of a comprehensive MRSA colonization surveillance program. It is not intended to diagnose MRSA infection nor to guide or monitor treatment for MRSA infections. Performed at [facility], [address] [place],  [address]   Stool culture     Status: None (Preliminary result)   Collection Time: 04/30/19  2:57 PM  Result Value Ref Range Status   Salmonella/Shigella Screen PENDING  Incomplete   Campylobacter Culture PENDING  Incomplete   E coli, Shiga  toxin Assay Negative Negative Final    Comment: (NOTE) Performed At: [facility] [address] [place], [place] [id number] Rush Farmer MD DU:[id number]   Gastrointestinal Panel by PCR , Stool     Status: None   Collection Time: 05/04/19 10:00 AM  Result Value Ref Range Status   Campylobacter species NOT DETECTED NOT DETECTED Final   Plesimonas shigelloides NOT DETECTED NOT DETECTED Final   Salmonella species NOT DETECTED NOT DETECTED Final   Yersinia enterocolitica NOT DETECTED NOT DETECTED Final   Vibrio species NOT DETECTED NOT DETECTED Final   Vibrio cholerae NOT DETECTED NOT DETECTED Final   Enteroaggregative E coli (EAEC) NOT DETECTED NOT DETECTED Final   Enteropathogenic E coli (EPEC) NOT DETECTED NOT DETECTED Final   Enterotoxigenic E coli (ETEC) NOT DETECTED NOT DETECTED Final   Shiga like toxin producing E coli (STEC) NOT DETECTED NOT DETECTED Final   Shigella/Enteroinvasive E coli (EIEC) NOT DETECTED NOT DETECTED Final   Cryptosporidium NOT DETECTED NOT DETECTED Final   Cyclospora cayetanensis NOT DETECTED NOT DETECTED Final   Entamoeba histolytica NOT DETECTED NOT DETECTED Final   Giardia lamblia NOT DETECTED NOT DETECTED Final   Adenovirus F40/41 NOT DETECTED NOT DETECTED Final   Astrovirus NOT DETECTED NOT DETECTED Final   Norovirus GI/GII NOT DETECTED NOT DETECTED Final   Rotavirus A NOT DETECTED NOT DETECTED Final   Sapovirus (I, II, IV, and V) NOT DETECTED NOT DETECTED Final    Comment: Performed at [facility], [address] [place],  [address]    RADIOLOGY:  No results found.  Follow up with PCP in 1 week.  Management plans discussed with the patient, family and they are in agreement.  CODE STATUS:  Code Status History    Date Active Date Inactive Code Status Order ID Comments User Context   04/30/2019 0801 05/05/2019 2023 DNR [id number]  Epifanio Lesches, MD ED   04/30/2019 0718 04/30/2019 0801 Full Code [id number]   Epifanio Lesches, MD ED   04/07/2019 0101 04/10/2019 1737 Full Code [id number]  Mayer Camel, NP ED   Advance Care Planning Activity    Questions for Most Recent Historical Code Status (Order [id number])    Question Answer Comment   In the event of cardiac or respiratory ARREST Do not call a "code blue"    In the event of cardiac or respiratory ARREST Do not perform Intubation, CPR, defibrillation or ACLS    In the event of cardiac or respiratory ARREST Use medication by any route, position, wound care, and other measures to relive pain and suffering. May use oxygen,  suction and manual treatment of airway obstruction as needed for comfort.    Comments NMP         Advance Directive Documentation     Most Recent Value  Type of Advance Directive  Healthcare Power of Attorney  Pre-existing out of facility DNR order (yellow form or pink MOST form)  -  "MOST" Form in Place?  -      TOTAL TIME TAKING CARE OF THIS PATIENT ON DAY OF DISCHARGE: more than 30 minutes.   Leia Alf Jakyle Petrucelli M.D on 05/06/2019 at 2:52 PM  Between 7am to 6pm - Pager - [phone]  After 6pm go to [email or web address] - password [email or web address] [facility]  Office  [phone]  CC: Primary care physician; Maryland Pink, MD  Note: This dictation was prepared with Dragon dictation along with smaller phrase technology. Any transcriptional errors that result from this process are unintentional.

## 2019-05-08 LAB — STOOL CULTURE REFLEX - CMPCXR

## 2019-05-08 LAB — STOOL CULTURE REFLEX - RSASHR

## 2019-05-08 LAB — STOOL CULTURE: E coli, Shiga toxin Assay: NEGATIVE

## 2019-05-12 ENCOUNTER — Ambulatory Visit (INDEPENDENT_AMBULATORY_CARE_PROVIDER_SITE_OTHER): Payer: Medicare Other

## 2019-05-12 ENCOUNTER — Other Ambulatory Visit: Payer: Self-pay

## 2019-05-12 ENCOUNTER — Telehealth: Payer: Self-pay | Admitting: Pulmonary Disease

## 2019-05-12 DIAGNOSIS — J4540 Moderate persistent asthma, uncomplicated: Secondary | ICD-10-CM | POA: Diagnosis not present

## 2019-05-12 MED ORDER — OMALIZUMAB 150 MG ~~LOC~~ SOLR
375.0000 mg | Freq: Once | SUBCUTANEOUS | Status: AC
  Administered 2019-05-12: 375 mg via SUBCUTANEOUS

## 2019-05-12 NOTE — Telephone Encounter (Signed)
Ria Comment, can you help with this. Thanks

## 2019-05-12 NOTE — Progress Notes (Signed)
Have you been hospitalized within the last 10 days?  Yes Do you have a fever?  No Do you have a cough?  No Do you have a headache or sore throat? No Do you have your Epi Pen visible and is it within date?  Yes   Patient was hospitalized for collagenous collitis and in the ICU x4 days; discharged on 7.30.2020.  Spoke with Dr. Annamaria Boots who reported patient may receive Xolair today.  Caitlin Leonard came in on 05/12/19 to receive a Xolair injection. The patient is given 375mg  every 14 days. Due to each vial equalling 150mg , 75mg  of medication was wasted.

## 2019-05-13 NOTE — Telephone Encounter (Signed)
Called [facility] and spoke with representative Ray Per their system, Dr Merton Border is still listed as patient's provider and she has 3 refills remaining on her Xolair Rx.  Per Ray their system shows that she called them yesterday stating that she has not been seen by this office since 2018 (last visit was 2.28.2020 w/ Sim[facility]) and that she will be contacting the office and had received injection yesterday morning 8.6.2020 (which is correct).  Advised Ray that patient is STILL under Dr. Alva Garnet' care and that she was just recently seen in Feb 2020 and is now due for follow up.  A note has been placed in their system.  Will call patient to discuss.

## 2019-05-19 NOTE — Telephone Encounter (Signed)
ATC Patient to follow up.  Left message for Patient to call back once available.

## 2019-05-20 NOTE — Telephone Encounter (Signed)
Spoke with pt, she stated everything has been worked out and nothing further is needed.

## 2019-05-23 ENCOUNTER — Telehealth: Payer: Self-pay | Admitting: Pulmonary Disease

## 2019-05-24 NOTE — Telephone Encounter (Signed)
Called and spoke to pt.  Pt stated that she had received a voicemail from our office, however she is unsure as to who called.  Pt is unable to repeat the voicemail, as she has already deleted it.  I do not see record of our office attempting to contact pt. Per pt's chart, it appears that everything is up to date with xolair, therefore I do not think injection team contacted pt. Pt does have a pending OV on 05/30/2019, I have reminder pt of this appointment. Will close encounter, as nothing further is needed at this time.

## 2019-05-24 NOTE — Telephone Encounter (Signed)
Please contact the pt and see what she is calling in regards to.

## 2019-05-24 NOTE — Telephone Encounter (Signed)
Left message for pt

## 2019-05-26 ENCOUNTER — Other Ambulatory Visit: Payer: Self-pay

## 2019-05-26 ENCOUNTER — Ambulatory Visit (INDEPENDENT_AMBULATORY_CARE_PROVIDER_SITE_OTHER): Payer: Medicare Other

## 2019-05-26 DIAGNOSIS — J4540 Moderate persistent asthma, uncomplicated: Secondary | ICD-10-CM | POA: Diagnosis not present

## 2019-05-26 MED ORDER — OMALIZUMAB 150 MG ~~LOC~~ SOLR
375.0000 mg | SUBCUTANEOUS | Status: DC
  Administered 2019-05-26: 10:00:00 375 mg via SUBCUTANEOUS

## 2019-05-27 ENCOUNTER — Telehealth: Payer: Self-pay | Admitting: Pulmonary Disease

## 2019-05-27 NOTE — Telephone Encounter (Signed)

## 2019-05-28 ENCOUNTER — Other Ambulatory Visit: Payer: Self-pay | Admitting: Pulmonary Disease

## 2019-05-30 ENCOUNTER — Telehealth: Payer: Self-pay | Admitting: Pulmonary Disease

## 2019-05-30 ENCOUNTER — Other Ambulatory Visit: Payer: Self-pay

## 2019-05-30 ENCOUNTER — Ambulatory Visit: Payer: Medicare Other | Admitting: Pulmonary Disease

## 2019-05-30 ENCOUNTER — Encounter: Payer: Self-pay | Admitting: Pulmonary Disease

## 2019-05-30 VITALS — BP 132/78 | HR 97 | Temp 98.2°F | Ht 67.0 in | Wt 230.4 lb

## 2019-05-30 DIAGNOSIS — R053 Chronic cough: Secondary | ICD-10-CM

## 2019-05-30 DIAGNOSIS — R768 Other specified abnormal immunological findings in serum: Secondary | ICD-10-CM | POA: Diagnosis not present

## 2019-05-30 DIAGNOSIS — R05 Cough: Secondary | ICD-10-CM | POA: Diagnosis not present

## 2019-05-30 NOTE — Patient Instructions (Signed)
Continue Xolair Continue Symbicort as you are currently using  Follow pu in 6 months with Dr Patsey Berthold. Call sooner as needed

## 2019-05-30 NOTE — Telephone Encounter (Signed)
Xolair Order: # vials: 6 Ordered date: 05/30/2019 Expected date of arrival: 06/01/2019 Ordered by: Desmond Dike, [place]: [facility]

## 2019-05-31 NOTE — Progress Notes (Signed)
PULMONARY OFFICE FOLLOW UP NOTE  Requesting MD/Service: Kary Kos Date of initial consultation: 09/09/16 Reason for consultation: Chronic cough  PT PROFILE: 73 y.o. F never smoker with chronic severe cough of > 4 yrs duration, previously evaluated by Dr Chase Caller and Dr Raul Del. She has also been evaluated by ENT Tami Ribas) previously. Despite multiple therapeutic attempts, there have been no interventions or therapies that have alleviated her cough  DATA: PFTs 04/17/14: No obstruction, normal lung volumes, minimal decrease in DLCO CT chest 02/20/15: Stable small pulmonary nodules compatible with a benign process HRCT 11/20/15: Normal CT sinuses 11/20/15: Normal Spirometry 09/09/16: No obstruction Bronchoscopy 10/10/16: nasal septal perforation, otherwise normal upper airway, normal cords, mild bronchitis, minimal clear mucus, no endobronchial masses or foreign bodies. BAL grew < 10k NOF Spirometry 01/06/17: FVC:  2.75L (77%pred), FEV1:  2.14 L (79 %pred), FEV1/FVC: 78% PSG 01/15/17: AHI 10/hr. REM AHI 48/hr CPAP titration 01/28/17: CPAP 12 cm H2O recommended CT chest 02/12/17: minimal pleural based nodular infiltrate in RLL, minimal chronic fibrotic appearing changes in lingula - both stable over 2 yrs duration CT maxillofacial 10/27/17: No evidence of sinusitis Bronchoscopy 12/11/17: normal anatomy. Moderate to severe diffuse erythematous and hyperemic mucosal changes which bled very easily with minimal scope trauma. Endobronchial mucosal biopsies obtained. Pathology: RESPIRATORY MUCOSA WITH MILD EOSINOPHILIC STROMAL INFILTRATE. NEGATIVE FOR MALIGNANCY. 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 Nebulized budesonide triggering migraines. Depomedrol 120 mg IM administered. Tussionex refilled. Referral to [facility] requested PFTs 02/22/18 [facility]): No obstruction, low normal lung  volumes, minimally reduced DLCO RAST 02/22/18 [facility]): normal IgE 02/22/18 ([place]): 330 (4-269) IgE 06/08/18: 926 08/2018: Xolair initiated 10/05/18 LE venous US: No DVT 10/07/18 CT chest: no acute pulmonary findings  INTERVAL: Last seen 12/03/18.  No major pulmonary events in the interim.  However, hospitalized 07/25-07/30 with severe diarrhea due to collagenous colitis   SUBJ: This is a scheduled follow-up. Recent hospitalization reviewed with her. She is now on tapering dose of prednisone - currently 15 mg/d. Her cough remains remarkably well controlled since initiation of Xolair. She has no new complaints. She denies CP, fever, purulent sputum, hemoptysis, LE edema and calf tenderness.   OBJ: Vitals:   05/30/19 1551  BP: 132/78  Pulse: 97  Temp: 98.2 F (36.8 C)  TempSrc: Temporal  SpO2: 95%  Weight: 230 lb 6.4 oz (104.5 kg)  Height: _0  (1.702 m)  Room air   EXAM: Gen: NAD HEENT: NCAT, sclerae white, chronic nasal septal perforation Neck: No JVD noted Lungs: breath sounds full, no wheezes or other adventitious sounds Cardiovascular: RRR, no murmurs Abdomen: Soft, nontender, normal BS Ext: without clubbing, cyanosis, edema Neuro: grossly intact Skin: Limited exam, no lesions noted   DATA:   BMP Latest Ref Rng & Units 05/05/2019 05/04/2019 05/03/2019  Glucose 70 - 99 mg/dL 146(H) 118(H) 137(H)  BUN 8 - 23 mg/dL _1 Creatinine 0.44 - 1.00 mg/dL 0.58 0.62 0.62  Sodium 135 - 145 mmol/L 137 137 137  Potassium 3.5 - 5.1 mmol/L 3.3(L) 3.9 3.9  Chloride 98 - 111 mmol/L 104 106 110  CO2 22 - 32 mmol/L _2 Calcium 8.9 - 10.3 mg/dL 8.8(L) 9.1 8.7(L)    CBC Latest Ref Rng & Units 05/03/2019 05/02/2019 05/01/2019  WBC 4.0 - 10.5 K/uL 5.8 5.4 3.9(L)  Hemoglobin 12.0 - 15.0 g/dL 9.9(L) 9.7(L) 10.7(L)  Hematocrit 36.0 - 46.0 % 29.2(L) 29.4(L) 32.5(L)  Platelets 150 -  400 K/uL 153 176 187    CXR: No new film  IMPRESSION:   Chronic cough, dramatic improvement  in control after initiation of Xolair 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  Call sooner if needed   Merton Border, MD PCCM service Mobile [phone] Pager [phone] 05/31/2019 10:44 AM

## 2019-06-01 NOTE — Telephone Encounter (Signed)
Xolair Received: # Vials: 6 Medication arrival date: 06/01/19 Lot #: [id number] Exp date: 11/06/2022 Received by: Tonna Corner

## 2019-06-09 ENCOUNTER — Ambulatory Visit (INDEPENDENT_AMBULATORY_CARE_PROVIDER_SITE_OTHER): Payer: Medicare Other

## 2019-06-09 ENCOUNTER — Other Ambulatory Visit: Payer: Self-pay

## 2019-06-09 DIAGNOSIS — J4540 Moderate persistent asthma, uncomplicated: Secondary | ICD-10-CM

## 2019-06-09 MED ORDER — OMALIZUMAB 150 MG ~~LOC~~ SOLR
375.0000 mg | SUBCUTANEOUS | Status: DC
  Administered 2019-06-09: 375 mg via SUBCUTANEOUS

## 2019-06-09 NOTE — Progress Notes (Signed)
All questions were answered by the patient before medication was administered. Have you been hospitalized in the last 10 days? No Do you have a fever? No Do you have a cough? No Do you have a headache or sore throat? No  

## 2019-06-20 ENCOUNTER — Telehealth: Payer: Self-pay | Admitting: Pulmonary Disease

## 2019-06-20 NOTE — Telephone Encounter (Signed)
Attempted to contact [place]. I was placed on a 15+ minute hold with no one coming to the line. Will try back.

## 2019-06-21 NOTE — Telephone Encounter (Signed)
Xolair Order: # vials: 6 Ordered date: 06/21/19 Expected date of arrival: 06/23/19 Ordered by: Zakkary Thibault,LPN Specialty Pharmacy: [facility]

## 2019-06-22 ENCOUNTER — Other Ambulatory Visit: Payer: Self-pay | Admitting: Pulmonary Disease

## 2019-06-23 ENCOUNTER — Other Ambulatory Visit: Payer: Self-pay

## 2019-06-23 ENCOUNTER — Ambulatory Visit (INDEPENDENT_AMBULATORY_CARE_PROVIDER_SITE_OTHER): Payer: Medicare Other

## 2019-06-23 DIAGNOSIS — J4540 Moderate persistent asthma, uncomplicated: Secondary | ICD-10-CM | POA: Diagnosis not present

## 2019-06-23 MED ORDER — OMALIZUMAB 150 MG ~~LOC~~ SOLR
375.0000 mg | SUBCUTANEOUS | Status: DC
  Administered 2019-06-23: 10:00:00 375 mg via SUBCUTANEOUS

## 2019-06-23 NOTE — Progress Notes (Signed)
All questions were answered by the patient before medication was administered. Have you been hospitalized in the last 10 days? No Do you have a fever? No Do you have a cough? No Do you have a headache or sore throat? No  

## 2019-06-23 NOTE — Telephone Encounter (Signed)
Xolair Received: # Vials: 6 Medication arrival date: 06/23/2019 Lot #: [id number] Exp date: 11/2022 Received by: Desmond Dike, [place]

## 2019-07-07 ENCOUNTER — Ambulatory Visit (INDEPENDENT_AMBULATORY_CARE_PROVIDER_SITE_OTHER): Payer: Medicare Other

## 2019-07-07 ENCOUNTER — Other Ambulatory Visit: Payer: Self-pay

## 2019-07-07 DIAGNOSIS — J4540 Moderate persistent asthma, uncomplicated: Secondary | ICD-10-CM | POA: Diagnosis not present

## 2019-07-07 MED ORDER — OMALIZUMAB 150 MG ~~LOC~~ SOLR
375.0000 mg | SUBCUTANEOUS | Status: DC
  Administered 2019-07-07: 375 mg via SUBCUTANEOUS

## 2019-07-07 NOTE — Progress Notes (Signed)
All questions were answered by the patient before medication was administered. Have you been hospitalized in the last 10 days? No Do you have a fever? No Do you have a cough? No Do you have a headache or sore throat? No  

## 2019-07-10 ENCOUNTER — Other Ambulatory Visit: Payer: Self-pay | Admitting: Pulmonary Disease

## 2019-07-18 ENCOUNTER — Telehealth: Payer: Self-pay | Admitting: Pulmonary Disease

## 2019-07-18 NOTE — Telephone Encounter (Signed)
Returned call to [facility] to schedule Xolair delivery.  Xolair Order: # vials: 6 Ordered date: 07/18/19 Expected date of arrival: 07/21/19 Ordered by: Lattie Haw, LPN Specialty Pharmacy: [facility]

## 2019-07-21 ENCOUNTER — Other Ambulatory Visit: Payer: Self-pay

## 2019-07-21 ENCOUNTER — Ambulatory Visit (INDEPENDENT_AMBULATORY_CARE_PROVIDER_SITE_OTHER): Payer: Medicare Other

## 2019-07-21 DIAGNOSIS — J4540 Moderate persistent asthma, uncomplicated: Secondary | ICD-10-CM

## 2019-07-21 MED ORDER — OMALIZUMAB 150 MG ~~LOC~~ SOLR
375.0000 mg | Freq: Once | SUBCUTANEOUS | Status: AC
  Administered 2019-07-21: 375 mg via SUBCUTANEOUS

## 2019-07-21 NOTE — Progress Notes (Signed)
Have you been hospitalized within the last 10 days?  No Do you have a fever?  No Do you have a cough?  No Do you have a headache or sore throat? No  

## 2019-07-21 NOTE — Telephone Encounter (Signed)
Xolair Received: # Vials: 6 Medication arrival date: 07/21/19 Lot #: [id number] Exp date: 11/06/2022 Received by: Elliot Dally

## 2019-08-05 ENCOUNTER — Ambulatory Visit (INDEPENDENT_AMBULATORY_CARE_PROVIDER_SITE_OTHER): Payer: Medicare Other

## 2019-08-05 ENCOUNTER — Other Ambulatory Visit: Payer: Self-pay

## 2019-08-05 DIAGNOSIS — J4540 Moderate persistent asthma, uncomplicated: Secondary | ICD-10-CM | POA: Diagnosis not present

## 2019-08-05 MED ORDER — OMALIZUMAB 150 MG ~~LOC~~ SOLR
375.0000 mg | Freq: Once | SUBCUTANEOUS | Status: AC
  Administered 2019-08-05: 375 mg via SUBCUTANEOUS

## 2019-08-05 NOTE — Progress Notes (Signed)
Have you been hospitalized within the last 10 days?  No Do you have a fever?  No Do you have a cough?  No Do you have a headache or sore throat? No Do you have your Epi Pen visible and is it within date?  Yes 

## 2019-08-17 ENCOUNTER — Telehealth: Payer: Self-pay | Admitting: Pulmonary Disease

## 2019-08-17 NOTE — Telephone Encounter (Signed)
[facility]C [facility]. Was placed on an extremely long hold. Will call back.

## 2019-08-18 ENCOUNTER — Other Ambulatory Visit: Payer: Self-pay

## 2019-08-18 ENCOUNTER — Ambulatory Visit (INDEPENDENT_AMBULATORY_CARE_PROVIDER_SITE_OTHER): Payer: Medicare Other

## 2019-08-18 DIAGNOSIS — J4540 Moderate persistent asthma, uncomplicated: Secondary | ICD-10-CM

## 2019-08-18 MED ORDER — OMALIZUMAB 150 MG ~~LOC~~ SOLR
375.0000 mg | SUBCUTANEOUS | Status: DC
  Administered 2019-08-18: 375 mg via SUBCUTANEOUS

## 2019-08-18 NOTE — Progress Notes (Signed)
All questions were answered by the patient before medication was administered. Have you been hospitalized in the last 10 days? No Do you have a fever? No Do you have a cough? No Do you have a headache or sore throat? No  

## 2019-08-19 ENCOUNTER — Ambulatory Visit: Payer: Medicare Other

## 2019-08-19 NOTE — Telephone Encounter (Signed)
Xolair Order: # vials: 6 Ordered date: 08/19/2019 Expected date of arrival: 08/23/2019 Ordered by: Desmond Dike, [place]  Specialty Pharmacy: [facility]

## 2019-08-24 NOTE — Telephone Encounter (Signed)
Xolair Received: # Vials: 6 Medication arrival date: 08/24/19 Lot #: [id number] Exp date: 04/06/2023 Received by: Elliot Dally

## 2019-09-02 ENCOUNTER — Ambulatory Visit: Payer: Medicare Other

## 2019-09-08 ENCOUNTER — Other Ambulatory Visit: Payer: Self-pay

## 2019-09-08 ENCOUNTER — Ambulatory Visit (INDEPENDENT_AMBULATORY_CARE_PROVIDER_SITE_OTHER): Payer: Medicare Other

## 2019-09-08 DIAGNOSIS — J4540 Moderate persistent asthma, uncomplicated: Secondary | ICD-10-CM | POA: Diagnosis not present

## 2019-09-08 MED ORDER — OMALIZUMAB 150 MG ~~LOC~~ SOLR
375.0000 mg | Freq: Once | SUBCUTANEOUS | Status: AC
  Administered 2019-09-08: 375 mg via SUBCUTANEOUS

## 2019-09-08 NOTE — Progress Notes (Signed)
Have you been hospitalized within the last 10 days?  No Do you have a fever?  No Do you have a cough?  No Do you have a headache or sore throat? No Do you have your Epi Pen visible and is it within date?  Yes 

## 2019-09-14 ENCOUNTER — Other Ambulatory Visit: Payer: Self-pay | Admitting: Pulmonary Disease

## 2019-09-22 ENCOUNTER — Ambulatory Visit (INDEPENDENT_AMBULATORY_CARE_PROVIDER_SITE_OTHER): Payer: Medicare Other

## 2019-09-22 ENCOUNTER — Other Ambulatory Visit: Payer: Self-pay

## 2019-09-22 DIAGNOSIS — J4540 Moderate persistent asthma, uncomplicated: Secondary | ICD-10-CM

## 2019-09-22 MED ORDER — OMALIZUMAB 150 MG ~~LOC~~ SOLR
375.0000 mg | SUBCUTANEOUS | Status: DC
  Administered 2019-09-22: 11:00:00 375 mg via SUBCUTANEOUS

## 2019-09-22 NOTE — Progress Notes (Signed)
All questions were answered by the patient before medication was administered. Have you been hospitalized in the last 10 days? No Do you have a fever? No Do you have a cough? No Do you have a headache or sore throat? No  

## 2019-09-26 ENCOUNTER — Telehealth: Payer: Self-pay | Admitting: Pulmonary Disease

## 2019-09-26 MED ORDER — XOLAIR 150 MG ~~LOC~~ SOLR: SUBCUTANEOUS | 2 refills | Status: DC

## 2019-09-26 NOTE — Telephone Encounter (Signed)
Xolair Order: # vials: 6 Ordered date: 09/27/2019 Expected date of arrival: 09/28/2019 Ordered by: Desmond Dike, [place]  Specialty Pharmacy: [facility]

## 2019-09-28 NOTE — Telephone Encounter (Signed)
Xolair Received: # Vials: 6 Medication arrival date: 09/28/2019 Lot #: [id number] Exp date: 04/2023 Received by: Desmond Dike, [place]

## 2019-10-06 ENCOUNTER — Other Ambulatory Visit: Payer: Self-pay

## 2019-10-06 ENCOUNTER — Ambulatory Visit (INDEPENDENT_AMBULATORY_CARE_PROVIDER_SITE_OTHER): Payer: Medicare Other

## 2019-10-06 DIAGNOSIS — J4540 Moderate persistent asthma, uncomplicated: Secondary | ICD-10-CM

## 2019-10-06 MED ORDER — OMALIZUMAB 150 MG ~~LOC~~ SOLR
375.0000 mg | Freq: Once | SUBCUTANEOUS | Status: AC
  Administered 2019-10-06: 375 mg via SUBCUTANEOUS

## 2019-10-06 NOTE — Progress Notes (Signed)
Have you been hospitalized within the last 10 days?  No Do you have a fever?  No Do you have a cough?  No Do you have a headache or sore throat? No Do you have your Epi Pen visible and is it within date?  Yes 

## 2019-10-20 ENCOUNTER — Other Ambulatory Visit: Payer: Self-pay

## 2019-10-20 ENCOUNTER — Ambulatory Visit (INDEPENDENT_AMBULATORY_CARE_PROVIDER_SITE_OTHER): Payer: Medicare PPO

## 2019-10-20 DIAGNOSIS — J4540 Moderate persistent asthma, uncomplicated: Secondary | ICD-10-CM

## 2019-10-20 MED ORDER — OMALIZUMAB 150 MG ~~LOC~~ SOLR
375.0000 mg | Freq: Once | SUBCUTANEOUS | Status: AC
  Administered 2019-10-20: 375 mg via SUBCUTANEOUS

## 2019-10-20 NOTE — Progress Notes (Signed)
Have you been hospitalized within the last 10 days?  No Do you have a fever?  No Do you have a cough?  No Do you have a headache or sore throat? No Do you have your Epi Pen visible and is it within date?  Yes 

## 2019-10-21 ENCOUNTER — Telehealth: Payer: Self-pay | Admitting: Pulmonary Disease

## 2019-10-21 NOTE — Telephone Encounter (Signed)
I was asked by Maryann Conners to contact the pt's husband about the pt's missing ring. The pt came to the office on 09/22/2019 to get a Xolair injection. She was taken to exam room A4. The normal location of where injections are normally given was having construction done so we could not use that room. According to the pt, she lost a ring while she was in room A4. The ring is valued over $3,000. I do not recall noticing her dropping the ring. Room A4 has linoleum  flooring, if the ring had fallen we would have heard it. This situation has been going on since this day.  The pt's husband has contacted several departments of [facility] about this. Our office has contacted the cleaning crew that would have cleaned this room after hours. They state that they did not find any items in the exam room when it was cleaned. Several members of our clinical staff checked room A4 thoroughly but a ring was not found. Pt's husband is very gruff while speaking to him. He asked if our office could contact the cleaning service again to see if anything has turned up. He also requested that we speak to other pts that could have possibly gone in the exam room after the pt. I explained to him that I would have to speak to Tammy about these requests. I spoke with Maryann Conners. She states that she has spoken with the cleaning service several times and they are still stating that they did not find anything and nothing has been found. We will not be contacting any other pts from 09/22/2019's schedule about if they possibly found the ring. I called the pt's husband back after speaking with Maryann Conners. He is aware of this information. States that he doesn't have any further requests from [place] but will call back if anything else comes up. Nothing further was needed at this time.

## 2019-10-25 ENCOUNTER — Telehealth: Payer: Self-pay | Admitting: Pulmonary Disease

## 2019-10-25 NOTE — Telephone Encounter (Signed)
CMM Xolair PA denied under Medicare Part D, but Approved through 10/05/2020 under Medicare Part B.   This request was denied under your Medicare Part D benefit; however, coverage for the requested drug(s) has been approved under Medicare Part B. [employer] follows Medicare rules. The Medicare rule in Chapter 6 of the Prescription Drug Manual says that drugs covered under the Part B benefit cannot be covered under Part D. The information we have says you are not picking up the medicine from a drug store. The Medicare Benefit Manual (Chapter 15, Section 60) says Medicare Part B pays when the drug is being billed, dispensed and administered by the physician, physician-based infusion clinic or hospital-based infusion clinic; or the drug is billed and shipped from a retail pharmacy for you to the physicians office or facility. [employer] has approved coverage for your drug under your Part B benefit for/through 10/05/2020. If you think Medicare Part D should cover this drug for you, you may appeal.

## 2019-10-25 NOTE — Telephone Encounter (Signed)
Medication name and strength: Xolair 375mg  every 2 weeks Provider: Dr. Patsey Berthold Pharmacy: [facility]   Was the PA started on CMM?  yes If yes, please enter the Key: [id number] Timeframe for approval/denial: 72 hrs  Caitlin Leonard Key: [id number] - PA Case ID: [id number] Need help? Call us at [phone]  Status  Sent to Plan today  Drug Xolair 150MG  solution  Form [employer] Electronic PA Form

## 2019-10-27 NOTE — Telephone Encounter (Signed)
Called [facility] to follow up on PA and set up Xolair delivery. Spoke with [employer]. Ryan stated Xolair PA is still being reviewed. Explained denial through Medicare part D, but approved through Medicare part B.  Ryan stated Xolair could be in review for 5-7 days, until final decision.  Will continue to follow up.

## 2019-10-28 NOTE — Telephone Encounter (Signed)
Called [facility].  Xolair PA approved through 10/05/2020.  Xolair Order: # vials: 6 Ordered date: 10/28/19 Expected date of arrival: 11/02/19 Ordered by: [place]: [facility]

## 2019-11-02 ENCOUNTER — Encounter: Payer: Self-pay | Admitting: Pulmonary Disease

## 2019-11-02 ENCOUNTER — Ambulatory Visit: Payer: Medicare PPO | Admitting: Pulmonary Disease

## 2019-11-02 ENCOUNTER — Other Ambulatory Visit: Payer: Self-pay

## 2019-11-02 VITALS — BP 128/76 | HR 67 | Temp 97.0°F | Ht 68.0 in | Wt 237.6 lb

## 2019-11-02 DIAGNOSIS — J4540 Moderate persistent asthma, uncomplicated: Secondary | ICD-10-CM

## 2019-11-02 DIAGNOSIS — R053 Chronic cough: Secondary | ICD-10-CM

## 2019-11-02 DIAGNOSIS — R768 Other specified abnormal immunological findings in serum: Secondary | ICD-10-CM

## 2019-11-02 DIAGNOSIS — R05 Cough: Secondary | ICD-10-CM

## 2019-11-02 MED ORDER — ALBUTEROL SULFATE HFA 108 (90 BASE) MCG/ACT IN AERS: 2.0000 | INHALATION_SPRAY | Freq: Four times a day (QID) | RESPIRATORY_TRACT | 6 refills | Status: DC | PRN

## 2019-11-02 NOTE — Progress Notes (Signed)
 Assessment & Plan:  1. Extrinsic asthma, moderate persistent, uncomplicated (Primary)  2. Chronic cough  3. Elevated IgE level   Patient Instructions  We will continue Xolair   A prescription for albuterol  was sent to your pharmacy of choice  Follow-up in 6 months  Please note: late entry documentation due to logistical difficulties during COVID-19 pandemic. This note is filed for information purposes only, and is not intended to be used for billing, nor does it represent the full scope/nature of the visit in question. Please see any associated scanned media linked to date of encounter for additional pertinent information.  Subjective:    HPI: Caitlin Leonard is a 74 y.o. female presenting to the pulmonology clinic on 11/02/2019 with report of: Follow-up (Pt states she has been feeling great lung wise. She has had SOB on exertion, occ coughing and wheezing. Pt denies any fever or chills)     Outpatient Encounter Medications as of 11/02/2019  Medication Sig Note   [DISCONTINUED] albuterol  (PROVENTIL  HFA;VENTOLIN  HFA) 108 (90 Base) MCG/ACT inhaler Inhale 1-2 puffs into the lungs every 6 (six) hours as needed for wheezing or shortness of breath.    [DISCONTINUED] albuterol  (VENTOLIN  HFA) 108 (90 Base) MCG/ACT inhaler Inhale 2 puffs into the lungs every 6 (six) hours as needed for wheezing or shortness of breath. (Patient not taking: Reported on 11/18/2021)    [DISCONTINUED] ALPRAZolam  (XANAX ) 0.5 MG tablet Take 0.5 mg by mouth at bedtime.     [DISCONTINUED] aspirin  EC 81 MG tablet Take 81 mg by mouth daily. (Patient not taking: Reported on 11/18/2021)    [DISCONTINUED] budesonide  (ENTOCORT EC ) 3 MG 24 hr capsule Take by mouth.    [DISCONTINUED] carboxymethylcellulose (REFRESH PLUS) 0.5 % SOLN Place 1 drop into both eyes 2 (two) times daily.    [DISCONTINUED] diltiazem  (DILACOR XR ) 180 MG 24 hr capsule Take 180 mg by mouth daily.    [DISCONTINUED] EPIPEN  2-PAK 0.3 MG/0.3ML SOAJ  injection Inject 0.3 mg into the muscle once.     [DISCONTINUED] hyoscyamine  (LEVSIN ) 0.125 MG tablet Take 0.125 mg by mouth every 4 (four) hours as needed. (Patient not taking: Reported on 03/26/2022)    [DISCONTINUED] memantine  (NAMENDA ) 5 MG tablet Take 5 mg by mouth 2 (two) times daily.    [DISCONTINUED] metoprolol  tartrate (LOPRESSOR ) 50 MG tablet TAKE 1 TABLET(50 MG) BY MOUTH TWICE DAILY (Patient taking differently: Take 50 mg by mouth 2 (two) times daily.) 03/26/2022: Patient takes 100 mg every morning and 50 mg at bedtime   [DISCONTINUED] omalizumab  (XOLAIR ) 150 MG injection INJECT 375MG  SUBCUTANEOUSLY EVERY 2 WEEKS (GIVEN AT  PRESCRIBERS OFFICE, DISCARD UNUSED)    [DISCONTINUED] omeprazole  (PRILOSEC) 40 MG capsule TAKE 1 CAPSULE BY MOUTH DAILY AT DINNER TIME    [DISCONTINUED] ondansetron  (ZOFRAN -ODT) 4 MG disintegrating tablet Take 1 tablet (4 mg total) by mouth every 8 (eight) hours as needed for nausea or vomiting. (Patient not taking: No sig reported)    [DISCONTINUED] prazosin  (MINIPRESS ) 1 MG capsule Take 1 mg by mouth at bedtime.    [DISCONTINUED] sertraline  (ZOLOFT ) 100 MG tablet Take 150 mg by mouth daily.     [DISCONTINUED] loperamide  (IMODIUM ) 2 MG capsule Take 1 capsule (2 mg total) by mouth every 6 (six) hours as needed for diarrhea or loose stools.    [DISCONTINUED] predniSONE  (DELTASONE ) 10 MG tablet Take 3 tablets (30 mg total) by mouth daily with breakfast.    [DISCONTINUED] RABEprazole (ACIPHEX) 20 MG tablet Take 20 mg by mouth daily.  prior to a meal.    [DISCONTINUED] rosuvastatin  (CRESTOR ) 40 MG tablet Take 1 tablet (40 mg total) by mouth daily.    [DISCONTINUED] sucralfate  (CARAFATE ) 1 g tablet Take 1 g by mouth 2 (two) times daily.    [DISCONTINUED] chlorpheniramine-HYDROcodone  (TUSSIONEX) 10-8 MG/5ML suspension 5 mL     [DISCONTINUED] Dupilumab  SOSY 300 mg     No facility-administered encounter medications on file as of 11/02/2019.      Objective:    Vitals:   11/02/19 1021  BP: 128/76  Pulse: 67  Temp: (!) 97 F (36.1 C)  Height: 5' 8 (1.727 m)  Weight: 237 lb 9.6 oz (107.8 kg)  SpO2: 94% Comment: on ra  TempSrc: Temporal  BMI (Calculated): 36.14     Physical exam documentation is limited by delayed entry of information.

## 2019-11-02 NOTE — Telephone Encounter (Signed)
Called [facility] to check on the status of the pt's shipment. Was advised that they tried to reach out to [place] to reschedule the pt's shipment but could never get a hold of [place]. Pt's shipment will now be arriving at our office on 11/04/2019.  Spoke with pt and explained what was going on. Pt was very understanding. Her appointment has been moved to 11/08/2019 at 1130. Will await shipment.

## 2019-11-02 NOTE — Patient Instructions (Addendum)
We will continue Xolair  A prescription for albuterol was sent to your pharmacy of choice  Follow-up in 6 months

## 2019-11-03 ENCOUNTER — Ambulatory Visit: Payer: Medicare PPO

## 2019-11-04 NOTE — Telephone Encounter (Signed)
Xolair Received: # Vials: 6 Medication arrival date: 11/04/2019 Lot #: [id number] Exp date: 06/2023 Received by: Desmond Dike, [place]

## 2019-11-08 ENCOUNTER — Other Ambulatory Visit: Payer: Self-pay

## 2019-11-08 ENCOUNTER — Ambulatory Visit (INDEPENDENT_AMBULATORY_CARE_PROVIDER_SITE_OTHER): Payer: Medicare PPO

## 2019-11-08 DIAGNOSIS — J4540 Moderate persistent asthma, uncomplicated: Secondary | ICD-10-CM

## 2019-11-08 MED ORDER — OMALIZUMAB 150 MG ~~LOC~~ SOLR
375.0000 mg | Freq: Once | SUBCUTANEOUS | Status: AC
  Administered 2019-11-08: 375 mg via SUBCUTANEOUS

## 2019-11-08 NOTE — Progress Notes (Signed)
Have you been hospitalized within the last 10 days?  No Do you have a fever?  No Do you have a cough?  No Do you have a headache or sore throat? No Do you have your Epi Pen visible and is it within date?  Yes 

## 2019-11-17 ENCOUNTER — Other Ambulatory Visit: Payer: Self-pay | Admitting: Pulmonary Disease

## 2019-11-23 ENCOUNTER — Other Ambulatory Visit: Payer: Self-pay

## 2019-11-23 ENCOUNTER — Ambulatory Visit (INDEPENDENT_AMBULATORY_CARE_PROVIDER_SITE_OTHER): Payer: Medicare PPO

## 2019-11-23 DIAGNOSIS — J4540 Moderate persistent asthma, uncomplicated: Secondary | ICD-10-CM | POA: Diagnosis not present

## 2019-11-23 MED ORDER — OMALIZUMAB 150 MG ~~LOC~~ SOLR
375.0000 mg | Freq: Once | SUBCUTANEOUS | Status: AC
  Administered 2019-11-23: 11:00:00 375 mg via SUBCUTANEOUS

## 2019-11-23 NOTE — Progress Notes (Signed)
Have you been hospitalized within the last 10 days?  No Do you have a fever?  No Do you have a cough?  No Do you have a headache or sore throat? No Do you have your Epi Pen visible and is it within date?  Yes 

## 2019-11-28 ENCOUNTER — Telehealth: Payer: Self-pay | Admitting: Pulmonary Disease

## 2019-11-28 NOTE — Telephone Encounter (Signed)
Xolair Order: # vials: 6 Ordered date: 11/28/2019 Expected date of arrival: 12/01/2019 Ordered by: Desmond Dike, [place]  Specialty Pharmacy: [facility]

## 2019-11-28 NOTE — Telephone Encounter (Signed)
Will route to injection pool.

## 2019-11-30 NOTE — Telephone Encounter (Signed)
Xolair Received: # Vials: 6 Medication arrival date: 11/30/2019 Lot #: [id number] Exp date: 06/2023 Received by: Desmond Dike, [place]

## 2019-12-07 ENCOUNTER — Ambulatory Visit (INDEPENDENT_AMBULATORY_CARE_PROVIDER_SITE_OTHER): Payer: Medicare PPO

## 2019-12-07 ENCOUNTER — Other Ambulatory Visit: Payer: Self-pay

## 2019-12-07 DIAGNOSIS — J4540 Moderate persistent asthma, uncomplicated: Secondary | ICD-10-CM

## 2019-12-07 MED ORDER — OMALIZUMAB 150 MG ~~LOC~~ SOLR
375.0000 mg | SUBCUTANEOUS | Status: DC
  Administered 2019-12-07: 11:00:00 375 mg via SUBCUTANEOUS

## 2019-12-07 NOTE — Progress Notes (Signed)
All questions were answered by the patient before medication was administered. Have you been hospitalized in the last 10 days? No Do you have a fever? No Do you have a cough? No Do you have a headache or sore throat? No  

## 2019-12-08 ENCOUNTER — Other Ambulatory Visit: Payer: Self-pay | Admitting: Family Medicine

## 2019-12-08 DIAGNOSIS — I712 Thoracic aortic aneurysm, without rupture: Secondary | ICD-10-CM

## 2019-12-08 DIAGNOSIS — Z1231 Encounter for screening mammogram for malignant neoplasm of breast: Secondary | ICD-10-CM

## 2019-12-08 DIAGNOSIS — I7121 Aneurysm of the ascending aorta, without rupture: Secondary | ICD-10-CM

## 2019-12-16 ENCOUNTER — Ambulatory Visit
Admission: RE | Admit: 2019-12-16 | Discharge: 2019-12-16 | Disposition: A | Discharge status: Home / Self Care | Payer: Medicare PPO | Source: Ambulatory Visit | Attending: Family Medicine | Admitting: Family Medicine

## 2019-12-16 ENCOUNTER — Other Ambulatory Visit: Payer: Self-pay

## 2019-12-16 DIAGNOSIS — I712 Thoracic aortic aneurysm, without rupture: Secondary | ICD-10-CM | POA: Diagnosis present

## 2019-12-16 DIAGNOSIS — I7121 Aneurysm of the ascending aorta, without rupture: Secondary | ICD-10-CM

## 2019-12-21 ENCOUNTER — Other Ambulatory Visit: Payer: Self-pay

## 2019-12-21 ENCOUNTER — Ambulatory Visit (INDEPENDENT_AMBULATORY_CARE_PROVIDER_SITE_OTHER): Payer: Medicare PPO

## 2019-12-21 DIAGNOSIS — J4540 Moderate persistent asthma, uncomplicated: Secondary | ICD-10-CM

## 2019-12-21 MED ORDER — OMALIZUMAB 150 MG ~~LOC~~ SOLR
375.0000 mg | SUBCUTANEOUS | Status: DC
  Administered 2019-12-21: 375 mg via SUBCUTANEOUS

## 2019-12-21 NOTE — Progress Notes (Signed)
All questions were answered by the patient before medication was administered. Have you been hospitalized in the last 10 days? No Do you have a fever? No Do you have a cough? No Do you have a headache or sore throat? No  

## 2019-12-23 ENCOUNTER — Ambulatory Visit
Admission: RE | Admit: 2019-12-23 | Discharge: 2019-12-23 | Disposition: A | Discharge status: Home / Self Care | Payer: Medicare PPO | Source: Ambulatory Visit | Attending: Family Medicine | Admitting: Family Medicine

## 2019-12-23 DIAGNOSIS — Z1231 Encounter for screening mammogram for malignant neoplasm of breast: Secondary | ICD-10-CM | POA: Insufficient documentation

## 2020-01-04 NOTE — Telephone Encounter (Signed)
Dr. Patsey Berthold are you ok to refill Zyrtec for patient? It appears someone in another office took off her med list. I have called and left message for patient to let us know if she is still taking.

## 2020-01-05 ENCOUNTER — Ambulatory Visit: Payer: Medicare PPO

## 2020-01-06 NOTE — Telephone Encounter (Signed)
It is OTC and may not need script. Ok to take but it is rather cheap on generic form OTC

## 2020-01-11 ENCOUNTER — Telehealth: Payer: Self-pay | Admitting: Pulmonary Disease

## 2020-01-11 NOTE — Telephone Encounter (Signed)
Xolair Received: # Vials: 6 Medication arrival date: 01/11/20 Lot #: [id number] Exp date: 07/06/2023 Received by: Elliot Dally

## 2020-01-12 ENCOUNTER — Ambulatory Visit (INDEPENDENT_AMBULATORY_CARE_PROVIDER_SITE_OTHER): Payer: Medicare PPO

## 2020-01-12 ENCOUNTER — Other Ambulatory Visit: Payer: Self-pay

## 2020-01-12 DIAGNOSIS — J4540 Moderate persistent asthma, uncomplicated: Secondary | ICD-10-CM

## 2020-01-12 MED ORDER — OMALIZUMAB 150 MG ~~LOC~~ SOLR: 375.0000 mg | SUBCUTANEOUS | Status: DC

## 2020-01-12 MED ORDER — OMALIZUMAB 150 MG ~~LOC~~ SOLR
375.0000 mg | Freq: Once | SUBCUTANEOUS | Status: AC
  Administered 2020-01-12: 375 mg via SUBCUTANEOUS

## 2020-01-12 NOTE — Progress Notes (Signed)
Have you been hospitalized within the last 10 days?  No Do you have a fever?  No Do you have a cough?  No Do you have a headache or sore throat? No Do you have your Epi Pen visible and is it within date?  Yes 

## 2020-01-19 ENCOUNTER — Ambulatory Visit: Payer: Medicare PPO

## 2020-01-20 NOTE — Telephone Encounter (Signed)
Refill submitted. 

## 2020-01-25 ENCOUNTER — Other Ambulatory Visit: Payer: Self-pay | Admitting: Pulmonary Disease

## 2020-01-26 ENCOUNTER — Other Ambulatory Visit: Payer: Self-pay

## 2020-01-26 ENCOUNTER — Ambulatory Visit (INDEPENDENT_AMBULATORY_CARE_PROVIDER_SITE_OTHER): Payer: Medicare PPO

## 2020-01-26 DIAGNOSIS — J4540 Moderate persistent asthma, uncomplicated: Secondary | ICD-10-CM

## 2020-01-26 MED ORDER — OMALIZUMAB 150 MG ~~LOC~~ SOLR
375.0000 mg | SUBCUTANEOUS | Status: DC
  Administered 2020-01-26: 10:00:00 375 mg via SUBCUTANEOUS

## 2020-01-26 NOTE — Progress Notes (Signed)
All questions were answered by the patient before medication was administered. Have you been hospitalized in the last 10 days? No Do you have a fever? No Do you have a cough? No Do you have a headache or sore throat? No  

## 2020-02-02 ENCOUNTER — Telehealth: Payer: Self-pay | Admitting: Pulmonary Disease

## 2020-02-02 ENCOUNTER — Ambulatory Visit: Payer: Medicare PPO

## 2020-02-02 NOTE — Telephone Encounter (Signed)
Xolair Order: # vials: 6 Ordered date: 02/02/2020 Expected date of arrival: 02/07/2020 Ordered by: Desmond Dike, [place]  Specialty Pharmacy: [facility]

## 2020-02-07 NOTE — Telephone Encounter (Signed)
Xolair Received: # Vials: 6 Medication arrival date: 02/07/2020 Lot #: [id number] Exp date: 07/2023 Received by: Desmond Dike, [place]

## 2020-02-09 ENCOUNTER — Ambulatory Visit (INDEPENDENT_AMBULATORY_CARE_PROVIDER_SITE_OTHER): Payer: Medicare PPO

## 2020-02-09 ENCOUNTER — Other Ambulatory Visit: Payer: Self-pay

## 2020-02-09 DIAGNOSIS — J4540 Moderate persistent asthma, uncomplicated: Secondary | ICD-10-CM

## 2020-02-09 MED ORDER — OMALIZUMAB 150 MG ~~LOC~~ SOLR
375.0000 mg | SUBCUTANEOUS | Status: DC
  Administered 2020-02-09: 375 mg via SUBCUTANEOUS

## 2020-02-09 NOTE — Progress Notes (Signed)
All questions were answered by the patient before medication was administered. Have you been hospitalized in the last 10 days? No Do you have a fever? No Do you have a cough? No Do you have a headache or sore throat? No  

## 2020-02-27 ENCOUNTER — Other Ambulatory Visit: Payer: Self-pay

## 2020-02-27 ENCOUNTER — Ambulatory Visit (INDEPENDENT_AMBULATORY_CARE_PROVIDER_SITE_OTHER): Payer: Medicare PPO

## 2020-02-27 DIAGNOSIS — J4540 Moderate persistent asthma, uncomplicated: Secondary | ICD-10-CM

## 2020-02-27 MED ORDER — OMALIZUMAB 150 MG ~~LOC~~ SOLR
375.0000 mg | SUBCUTANEOUS | Status: DC
  Administered 2020-02-27: 375 mg via SUBCUTANEOUS

## 2020-02-27 NOTE — Progress Notes (Signed)
All questions were answered by the patient before medication was administered. Have you been hospitalized in the last 10 days? No Do you have a fever? No Do you have a cough? No Do you have a headache or sore throat? No  

## 2020-02-28 ENCOUNTER — Other Ambulatory Visit: Payer: Self-pay | Admitting: Family Medicine

## 2020-02-28 DIAGNOSIS — R9389 Abnormal findings on diagnostic imaging of other specified body structures: Secondary | ICD-10-CM

## 2020-02-28 DIAGNOSIS — Z09 Encounter for follow-up examination after completed treatment for conditions other than malignant neoplasm: Secondary | ICD-10-CM

## 2020-03-07 ENCOUNTER — Telehealth: Payer: Self-pay | Admitting: Pulmonary Disease

## 2020-03-07 NOTE — Telephone Encounter (Signed)
Xolair Order: # vials: 6 Ordered date: 03/07/2020 Expected date of arrival: 03/09/2020 Ordered by: Desmond Dike, [place]  Specialty Pharmacy: [facility]

## 2020-03-09 NOTE — Telephone Encounter (Signed)
Called [facility], medication will now be shipped to [place] on 03/13/2020.  LMTCB x1 for pt to reschedule her appointment. When the pt calls back, please reschedule on any day AFTER 03/13/2020. Double booking or triple booking is fine.

## 2020-03-09 NOTE — Telephone Encounter (Signed)
Felicia from [facility] called-- Caitlin Leonard could be delivered Tuesday but without syringe-- needs to know if she should still send.   Pt is currently schedule for a Monday injection and will need to be rescheduled regardless.   Call back for [facility] [phone]

## 2020-03-12 ENCOUNTER — Ambulatory Visit: Payer: Medicare PPO

## 2020-03-12 NOTE — Telephone Encounter (Signed)
Pt has been rescheduled to 03/14/2020. Will await shipment.

## 2020-03-13 ENCOUNTER — Ambulatory Visit
Admission: RE | Admit: 2020-03-13 | Discharge: 2020-03-13 | Disposition: A | Discharge status: Home / Self Care | Payer: Medicare PPO | Source: Ambulatory Visit | Attending: Family Medicine | Admitting: Family Medicine

## 2020-03-13 ENCOUNTER — Other Ambulatory Visit: Payer: Self-pay

## 2020-03-13 DIAGNOSIS — Z09 Encounter for follow-up examination after completed treatment for conditions other than malignant neoplasm: Secondary | ICD-10-CM | POA: Diagnosis not present

## 2020-03-13 DIAGNOSIS — R9389 Abnormal findings on diagnostic imaging of other specified body structures: Secondary | ICD-10-CM | POA: Diagnosis present

## 2020-03-13 NOTE — Telephone Encounter (Signed)
Xolair Received: # Vials: 6 Medication arrival date: 03/13/20 Lot #: [id number] Exp date: 08/06/2023 Received by: Elliot Dally

## 2020-03-14 ENCOUNTER — Ambulatory Visit (INDEPENDENT_AMBULATORY_CARE_PROVIDER_SITE_OTHER): Payer: Medicare PPO

## 2020-03-14 DIAGNOSIS — J4540 Moderate persistent asthma, uncomplicated: Secondary | ICD-10-CM

## 2020-03-14 MED ORDER — OMALIZUMAB 150 MG ~~LOC~~ SOLR
375.0000 mg | Freq: Once | SUBCUTANEOUS | Status: AC
  Administered 2020-03-14: 375 mg via SUBCUTANEOUS

## 2020-03-14 MED ORDER — OMALIZUMAB 150 MG ~~LOC~~ SOLR: 300.0000 mg | Freq: Once | SUBCUTANEOUS | Status: DC

## 2020-03-14 NOTE — Progress Notes (Signed)
Have you been hospitalized within the last 10 days?  No Do you have a fever?  No Do you have a cough?  No Do you have a headache or sore throat? No Do you have your Epi Pen visible and is it within date?  Yes 

## 2020-03-19 ENCOUNTER — Other Ambulatory Visit
Admission: RE | Admit: 2020-03-19 | Discharge: 2020-03-19 | Disposition: A | Discharge status: Home / Self Care | Payer: Medicare PPO | Source: Ambulatory Visit | Attending: Pulmonary Disease | Admitting: Pulmonary Disease

## 2020-03-19 ENCOUNTER — Other Ambulatory Visit: Payer: Self-pay

## 2020-03-19 ENCOUNTER — Encounter: Payer: Self-pay | Admitting: Pulmonary Disease

## 2020-03-19 ENCOUNTER — Ambulatory Visit: Payer: Medicare PPO | Admitting: Pulmonary Disease

## 2020-03-19 VITALS — BP 122/70 | HR 66 | Temp 97.9°F | Ht 69.0 in | Wt 250.0 lb

## 2020-03-19 DIAGNOSIS — R05 Cough: Secondary | ICD-10-CM | POA: Insufficient documentation

## 2020-03-19 DIAGNOSIS — R053 Chronic cough: Secondary | ICD-10-CM

## 2020-03-19 DIAGNOSIS — J4540 Moderate persistent asthma, uncomplicated: Secondary | ICD-10-CM | POA: Diagnosis present

## 2020-03-19 LAB — CBC WITH DIFFERENTIAL/PLATELET
Abs Immature Granulocytes: 0.02 10*3/uL (ref 0.00–0.07)
Basophils Absolute: 0.1 10*3/uL (ref 0.0–0.1)
Basophils Relative: 1 %
Eosinophils Absolute: 0.1 10*3/uL (ref 0.0–0.5)
Eosinophils Relative: 1 %
HCT: 38.6 % (ref 36.0–46.0)
Hemoglobin: 13.6 g/dL (ref 12.0–15.0)
Immature Granulocytes: 0 %
Lymphocytes Relative: 12 %
Lymphs Abs: 0.9 10*3/uL (ref 0.7–4.0)
MCH: 32.2 pg (ref 26.0–34.0)
MCHC: 35.2 g/dL (ref 30.0–36.0)
MCV: 91.5 fL (ref 80.0–100.0)
Monocytes Absolute: 0.7 10*3/uL (ref 0.1–1.0)
Monocytes Relative: 9 %
Neutro Abs: 5.9 10*3/uL (ref 1.7–7.7)
Neutrophils Relative %: 77 %
Platelets: 183 10*3/uL (ref 150–400)
RBC: 4.22 MIL/uL (ref 3.87–5.11)
RDW: 13.3 % (ref 11.5–15.5)
WBC: 7.7 10*3/uL (ref 4.0–10.5)
nRBC: 0 % (ref 0.0–0.2)

## 2020-03-19 MED ORDER — BREO ELLIPTA 200-25 MCG/INH IN AEPB: 1.0000 | INHALATION_SPRAY | Freq: Every day | RESPIRATORY_TRACT | 0 refills | Status: AC

## 2020-03-19 NOTE — Progress Notes (Signed)
 Assessment & Plan:  1. Extrinsic asthma, moderate persistent, uncomplicated (Primary) - Pulmonary Function Test [facility] Only; Future - Rheumatoid factor; Future - ANA w/Reflex; Future - CBC w/Diff; Future  2. Chronic cough - Pulmonary Function Test [facility] Only; Future - Rheumatoid factor; Future - ANA w/Reflex; Future - CBC w/Diff; Future   Patient Instructions  We are going to get breathing tests.   We are also going to get some blood tests.  Concerned that he may need to be switched from Xolair  to a different agent.   We will see you in follow-up after the above tests are done 6 to 8 weeks.   Giving you a trial of Breo Ellipta , PLEASE LET US  KNOW how this is doing for you so we can call the prescription into your pharmacy.  Please note: late entry documentation due to logistical difficulties during COVID-19 pandemic. This note is filed for information purposes only, and is not intended to be used for billing, nor does it represent the full scope/nature of the visit in question. Please see any associated scanned media linked to date of encounter for additional pertinent information.  Subjective:    HPI: Caitlin Leonard is a 74 y.o. female presenting to the pulmonology clinic on 03/19/2020 with report of: Follow-up (pt states breathing has worsen over the past week- c/o sob with exertion with exertion, non prod cough and wheezing. )     Outpatient Encounter Medications as of 03/19/2020  Medication Sig Note   cetirizine  (ZYRTEC ) 10 MG tablet TAKE 1 TABLET(10 MG) BY MOUTH AT BEDTIME    [DISCONTINUED] albuterol  (VENTOLIN  HFA) 108 (90 Base) MCG/ACT inhaler Inhale 2 puffs into the lungs every 6 (six) hours as needed for wheezing or shortness of breath. (Patient not taking: Reported on 11/18/2021)    [DISCONTINUED] ALPRAZolam  (XANAX ) 0.5 MG tablet Take 0.5 mg by mouth at bedtime.     [DISCONTINUED] aspirin  EC 81 MG tablet Take 81 mg by mouth daily. (Patient not taking: Reported on  11/18/2021)    [DISCONTINUED] budesonide  (ENTOCORT EC ) 3 MG 24 hr capsule Take by mouth.    [DISCONTINUED] carboxymethylcellulose (REFRESH PLUS) 0.5 % SOLN Place 1 drop into both eyes 2 (two) times daily.    [DISCONTINUED] diltiazem  (DILACOR XR ) 180 MG 24 hr capsule Take 180 mg by mouth daily.    [DISCONTINUED] EPIPEN  2-PAK 0.3 MG/0.3ML SOAJ injection Inject 0.3 mg into the muscle once.     [DISCONTINUED] hyoscyamine  (LEVSIN ) 0.125 MG tablet Take 0.125 mg by mouth every 4 (four) hours as needed. (Patient not taking: Reported on 03/26/2022)    [DISCONTINUED] metoprolol  tartrate (LOPRESSOR ) 50 MG tablet TAKE 1 TABLET(50 MG) BY MOUTH TWICE DAILY (Patient taking differently: Take 50 mg by mouth 2 (two) times daily.) 03/26/2022: Patient takes 100 mg every morning and 50 mg at bedtime   [DISCONTINUED] omalizumab  (XOLAIR ) 150 MG injection INJECT 375MG  SUBCUTANEOUSLY EVERY 2 WEEKS (GIVEN AT MD  OFFICE, DISCARD UNUSED)    [DISCONTINUED] omeprazole  (PRILOSEC) 40 MG capsule TAKE 1 CAPSULE BY MOUTH DAILY AT DINNER TIME    [DISCONTINUED] ondansetron  (ZOFRAN -ODT) 4 MG disintegrating tablet Take 1 tablet (4 mg total) by mouth every 8 (eight) hours as needed for nausea or vomiting. (Patient not taking: No sig reported)    [DISCONTINUED] sertraline  (ZOLOFT ) 100 MG tablet Take 150 mg by mouth daily.     [EXPIRED] fluticasone  furoate-vilanterol (BREO ELLIPTA ) 200-25 MCG/INH AEPB Inhale 1 puff into the lungs daily for 1 day.    [DISCONTINUED] loperamide  (  IMODIUM ) 2 MG capsule Take 1 capsule (2 mg total) by mouth every 6 (six) hours as needed for diarrhea or loose stools.    [DISCONTINUED] memantine  (NAMENDA ) 5 MG tablet Take 5 mg by mouth 2 (two) times daily.    [DISCONTINUED] prazosin  (MINIPRESS ) 1 MG capsule Take 1 mg by mouth at bedtime.    [DISCONTINUED] rosuvastatin  (CRESTOR ) 40 MG tablet Take 1 tablet (40 mg total) by mouth daily.    [DISCONTINUED] chlorpheniramine-HYDROcodone  (TUSSIONEX) 10-8 MG/5ML suspension 5 mL      [DISCONTINUED] Dupilumab  SOSY 300 mg     [DISCONTINUED] omalizumab  (XOLAIR ) injection 375 mg     [DISCONTINUED] omalizumab  (XOLAIR ) injection 375 mg     [DISCONTINUED] omalizumab  (XOLAIR ) injection 375 mg     [DISCONTINUED] omalizumab  (XOLAIR ) injection 375 mg     [DISCONTINUED] omalizumab  (XOLAIR ) injection 375 mg     No facility-administered encounter medications on file as of 03/19/2020.      Objective:   Vitals:   03/19/20 0928  BP: 122/70  Pulse: 66  Temp: 97.9 F (36.6 C)  Height: 5' 9 (1.753 m)  Weight: 250 lb (113.4 kg)  SpO2: 97%  TempSrc: Temporal  BMI (Calculated): 36.9     Physical exam documentation is limited by delayed entry of information.

## 2020-03-19 NOTE — Patient Instructions (Signed)
We are going to get breathing tests.   We are also going to get some blood tests.  Concerned that he may need to be switched from Xolair to a different agent.   We will see you in follow-up after the above tests are done 6 to 8 weeks.   Giving you a trial of Breo Ellipta, PLEASE LET us KNOW how this is doing for you so we can call the prescription into your pharmacy.

## 2020-03-20 LAB — ANA W/REFLEX: Anti Nuclear Antibody (ANA): NEGATIVE

## 2020-03-20 LAB — ANCA TITERS
Atypical P-ANCA titer: <1:20 {titer}
C-ANCA: <1:20 {titer}
P-ANCA: <1:20 {titer}

## 2020-03-20 LAB — RHEUMATOID FACTOR: Rheumatoid fact SerPl-aCnc: <10 IU/mL (ref 0.0–13.9)

## 2020-03-23 ENCOUNTER — Telehealth: Payer: Self-pay

## 2020-03-23 DIAGNOSIS — H35379 Puckering of macula, unspecified eye: Secondary | ICD-10-CM | POA: Insufficient documentation

## 2020-03-23 LAB — ALLERGENS W/TOTAL IGE AREA 2
Alternaria Alternata IgE: <0.1 kU/L
Aspergillus Fumigatus IgE: <0.1 kU/L
Bermuda Grass IgE: <0.1 kU/L
Cat Dander IgE: <0.1 kU/L
Cedar, Mountain IgE: <0.1 kU/L
Cladosporium Herbarum IgE: <0.1 kU/L
Cockroach, German IgE: <0.1 kU/L
Common Silver Birch IgE: <0.1 kU/L
Cottonwood IgE: <0.1 kU/L
D Farinae IgE: <0.1 kU/L
D Pteronyssinus IgE: <0.1 kU/L
Dog Dander IgE: <0.1 kU/L
Elm, American IgE: <0.1 kU/L
IgE (Immunoglobulin E), Serum: 1700 IU/mL — ABNORMAL HIGH (ref 6–495)
Johnson Grass IgE: <0.1 kU/L
Maple/Box Elder IgE: <0.1 kU/L
Mouse Urine IgE: <0.1 kU/L
Oak, White IgE: <0.1 kU/L
Pecan, Hickory IgE: <0.1 kU/L
Penicillium Chrysogen IgE: <0.1 kU/L
Pigweed, Rough IgE: <0.1 kU/L
Ragweed, Short IgE: <0.1 kU/L
Sheep Sorrel IgE Qn: <0.1 kU/L
Timothy Grass IgE: <0.1 kU/L
White Mulberry IgE: <0.1 kU/L

## 2020-03-23 MED ORDER — BUDESONIDE-FORMOTEROL FUMARATE 160-4.5 MCG/ACT IN AERO: 2.0000 | INHALATION_SPRAY | Freq: Two times a day (BID) | RESPIRATORY_TRACT | 5 refills | Status: DC

## 2020-03-23 NOTE — Telephone Encounter (Signed)
She was not on maintenance med previously. She has only had few days of Breo. We can swithch to Symbicort 160/4.5 2 puffs BID and see if that is better. Rinse mouth well after use. Can put some baking soda in rinse water.   We can also call in  Pred taper if she is not allergic

## 2020-03-23 NOTE — Telephone Encounter (Signed)
Spoke to pt and relayed below recommendations. Rx for symbicort 160 has been sent to preferred pharmacy.  Pt declined Rx for prednisone, as she is taking Entocort 3mg . Nothing further is needed.   Will route to Dr. Patsey Berthold as an Juluis Rainier.

## 2020-03-23 NOTE — Telephone Encounter (Signed)
Pt is aware of date/time of covid test prior to PFT and voiced her understanding.  During our conversation, pt mentioned that she does not feel that Memory Dance is effective. Pt feels that her cough has worsen and she is coughing more at night. Cough is non prod.   Dr. Patsey Berthold, please advise. Thanks

## 2020-03-26 ENCOUNTER — Ambulatory Visit: Payer: Medicare PPO

## 2020-03-26 NOTE — Telephone Encounter (Signed)
Dr. Gonzalez please advise. Thanks 

## 2020-03-28 ENCOUNTER — Other Ambulatory Visit: Payer: Self-pay

## 2020-03-28 ENCOUNTER — Telehealth: Payer: Self-pay | Admitting: Pulmonary Disease

## 2020-03-28 ENCOUNTER — Encounter: Payer: Self-pay | Admitting: Pulmonary Disease

## 2020-03-28 ENCOUNTER — Ambulatory Visit: Payer: Medicare PPO

## 2020-03-28 ENCOUNTER — Ambulatory Visit (INDEPENDENT_AMBULATORY_CARE_PROVIDER_SITE_OTHER): Payer: Medicare PPO | Admitting: Pulmonary Disease

## 2020-03-28 ENCOUNTER — Other Ambulatory Visit
Admission: RE | Admit: 2020-03-28 | Discharge: 2020-03-28 | Disposition: A | Discharge status: Home / Self Care | Payer: Medicare PPO | Source: Ambulatory Visit | Attending: Pulmonary Disease | Admitting: Pulmonary Disease

## 2020-03-28 DIAGNOSIS — J4540 Moderate persistent asthma, uncomplicated: Secondary | ICD-10-CM

## 2020-03-28 DIAGNOSIS — R05 Cough: Secondary | ICD-10-CM

## 2020-03-28 DIAGNOSIS — Z20822 Contact with and (suspected) exposure to covid-19: Secondary | ICD-10-CM | POA: Diagnosis not present

## 2020-03-28 DIAGNOSIS — Z01812 Encounter for preprocedural laboratory examination: Secondary | ICD-10-CM | POA: Diagnosis present

## 2020-03-28 DIAGNOSIS — D7218 Eosinophilia in diseases classified elsewhere: Secondary | ICD-10-CM

## 2020-03-28 DIAGNOSIS — R053 Chronic cough: Secondary | ICD-10-CM

## 2020-03-28 DIAGNOSIS — R768 Other specified abnormal immunological findings in serum: Secondary | ICD-10-CM

## 2020-03-28 DIAGNOSIS — J40 Bronchitis, not specified as acute or chronic: Secondary | ICD-10-CM | POA: Diagnosis not present

## 2020-03-28 DIAGNOSIS — R7689 Other specified abnormal immunological findings in serum: Secondary | ICD-10-CM

## 2020-03-28 LAB — SARS CORONAVIRUS 2 (TAT 6-24 HRS): SARS Coronavirus 2: NEGATIVE

## 2020-03-28 NOTE — Patient Instructions (Signed)
We will see in follow-up in 2 to 3 weeks time  You may see me or the nurse practitioner  We will obtain blood work at that visit

## 2020-03-28 NOTE — Addendum Note (Signed)
Addended by: Claudette Head A on: 03/28/2020 04:30 PM   Modules accepted: Orders

## 2020-03-28 NOTE — Progress Notes (Signed)
Subjective:    Patient ID: Caitlin Leonard, female    DOB: 1946-02-02, 74 y.o.   MRN: [id number] Virtual Visit Via Video or Telephone Note:   This visit type was conducted due to national recommendations for restrictions regarding the COVID-19 pandemic .  This format is felt to be most appropriate for this patient at this time.  All issues noted in this document were discussed and addressed.  No physical exam was performed (except for noted visual exam findings with Video Visits).    I connected with Caitlin Leonard by  telephone at 3:30 PM and verified that I was speaking with the correct person using two identifiers. Location patient: home Location provider: [facility] Persons participating in the virtual visit: patient, physician   I discussed the limitations, risks, security and privacy concerns of performing an evaluation and management service by telephone and the availability of in person appointments. The patient expressed understanding and agreed to proceed.  HPI Caitlin Leonard is a 74 year old lifelong never smoker with a very complex history who follows for the issue of cough.  Recently had CT scan of the chest performed on 8 June which by my review, may be consistent with hypersensitivity pneumonitis.  The patient has issues with elevated IgE and is on Xolair.  She has had issues with recurrent cough we evaluated her on 14 June.  We ordered IgE and allergy panel as well as ANCA panel and rheumatoid factor at that time.  This was to exclude potential EGPA.  Her total IgE was 1,700 however do note that she is on Xolair and sometimes levels can be artificially elevated.  Allergens were negative across the board.  They have and she did not have eosinophilia on CBC.  Given these findings on CT the possibility of hypersensitivity pneumonitis is now likely.  The patient had Symbicort ordered visit on the 14th and she notes that this medication is helping her.  Her cough is better  controlled.  Patient had a fall in her garden today and hit her head she states that EMS just recently left the home I have reminded her she has difficulties with visual disturbance, headache, nausea vomiting or increased somnolence to seek medical help immediately.  She states that she feels that she is "fine" currently   Review of Systems A 10 point review of systems was performed and it is as noted above otherwise negative.    Objective:   Physical Exam  No physical exam performed as the visit was via telephone.  Patient did not exhibit conversational dyspnea during the visit.      Assessment & Plan:     ICD-10-CM   1. Extrinsic asthma, moderate persistent, uncomplicated  P53.61    She has elevated IgE levels Query hypersensitivity pneumonitis Continue Symbicort twice a day Follow-up 2 to 3 weeks  2. Chronic cough  R05    Markedly improved on Symbicort  3. Elevated IgE level  R76.8    On Xolair therapy Query hypersensitivity pneumonitis  4. Eosinophilic bronchitis  W43    D72.18    By bronchoscopy 2019 May be a manifestation of hypersensitivity pneumonitis   Discussion:  Given the CT findings and laboratory data obtained the question now is whether patient has issues with hypersensitivity pneumonitis.  There is a fine line were hypersensitivity pneumonitis and asthma intersect.  We will obtain hypersensitivity pneumonitis panel on follow-up appointment.  She will follow-up in 2 to 3 weeks time she is to contact us prior to  that time should any new difficulties arise.   Time of visit was 15 minutes  C. Derrill Kay, MD [facility]   *This note was dictated using voice recognition software/Dragon.  Despite best efforts to proofread, errors can occur which can change the meaning.  Any change was purely unintentional.

## 2020-03-28 NOTE — Telephone Encounter (Signed)
Spoke with pt's husband, Alvester Chou. Pt has been rescheduled to 03/30/2020 at 1500. Nothing further was needed.

## 2020-03-29 ENCOUNTER — Other Ambulatory Visit: Payer: Self-pay

## 2020-03-29 ENCOUNTER — Ambulatory Visit: Payer: Medicare PPO | Attending: Pulmonary Disease

## 2020-03-29 DIAGNOSIS — R05 Cough: Secondary | ICD-10-CM | POA: Diagnosis not present

## 2020-03-29 DIAGNOSIS — R053 Chronic cough: Secondary | ICD-10-CM

## 2020-03-29 DIAGNOSIS — J4540 Moderate persistent asthma, uncomplicated: Secondary | ICD-10-CM | POA: Diagnosis not present

## 2020-03-29 MED ORDER — ALBUTEROL SULFATE (2.5 MG/3ML) 0.083% IN NEBU
2.5000 mg | INHALATION_SOLUTION | Freq: Once | RESPIRATORY_TRACT | Status: AC
  Administered 2020-03-29: 2.5 mg via RESPIRATORY_TRACT
  Filled 2020-03-29: qty 3

## 2020-03-30 ENCOUNTER — Ambulatory Visit: Payer: Medicare PPO

## 2020-04-03 ENCOUNTER — Other Ambulatory Visit: Payer: Self-pay

## 2020-04-03 ENCOUNTER — Ambulatory Visit (INDEPENDENT_AMBULATORY_CARE_PROVIDER_SITE_OTHER): Payer: Medicare PPO

## 2020-04-03 DIAGNOSIS — J4540 Moderate persistent asthma, uncomplicated: Secondary | ICD-10-CM | POA: Diagnosis not present

## 2020-04-03 MED ORDER — OMALIZUMAB 150 MG ~~LOC~~ SOLR
375.0000 mg | SUBCUTANEOUS | Status: DC
  Administered 2020-04-03: 375 mg via SUBCUTANEOUS

## 2020-04-03 NOTE — Progress Notes (Signed)
All questions were answered by the patient before medication was administered. Have you been hospitalized in the last 10 days? No Do you have a fever? No Do you have a cough? No Do you have a headache or sore throat? No  

## 2020-04-10 ENCOUNTER — Telehealth: Payer: Self-pay | Admitting: Pulmonary Disease

## 2020-04-10 NOTE — Telephone Encounter (Signed)
Xolair Order: # vials: 6 Ordered date: 04/10/20 Expected date of arrival: 04/12/20 Ordered by: [place]: Lennette Bihari

## 2020-04-11 ENCOUNTER — Ambulatory Visit: Payer: Medicare PPO

## 2020-04-12 NOTE — Telephone Encounter (Signed)
Xolair Received: # Vials: 6 Medication arrival date: 04/12/20 Lot #: [id number] Exp date: 11/05/2023 Received by: Elliot Dally

## 2020-04-17 ENCOUNTER — Other Ambulatory Visit: Payer: Self-pay

## 2020-04-17 ENCOUNTER — Ambulatory Visit (INDEPENDENT_AMBULATORY_CARE_PROVIDER_SITE_OTHER): Payer: Medicare PPO

## 2020-04-17 DIAGNOSIS — J4540 Moderate persistent asthma, uncomplicated: Secondary | ICD-10-CM

## 2020-04-17 MED ORDER — OMALIZUMAB 150 MG ~~LOC~~ SOLR
375.0000 mg | SUBCUTANEOUS | Status: DC
  Administered 2020-04-17: 375 mg via SUBCUTANEOUS

## 2020-04-17 NOTE — Progress Notes (Signed)
All questions were answered by the patient before medication was administered. Have you been hospitalized in the last 10 days? No Do you have a fever? No Do you have a cough? No Do you have a headache or sore throat? No  

## 2020-04-30 ENCOUNTER — Ambulatory Visit: Payer: Medicare PPO | Admitting: Pulmonary Disease

## 2020-05-01 ENCOUNTER — Other Ambulatory Visit: Payer: Self-pay

## 2020-05-01 ENCOUNTER — Ambulatory Visit (INDEPENDENT_AMBULATORY_CARE_PROVIDER_SITE_OTHER): Payer: Medicare PPO

## 2020-05-01 DIAGNOSIS — J4540 Moderate persistent asthma, uncomplicated: Secondary | ICD-10-CM | POA: Diagnosis not present

## 2020-05-01 MED ORDER — OMALIZUMAB 150 MG ~~LOC~~ SOLR
375.0000 mg | Freq: Once | SUBCUTANEOUS | Status: AC
  Administered 2020-05-01: 375 mg via SUBCUTANEOUS

## 2020-05-01 NOTE — Progress Notes (Signed)
Have you been hospitalized within the last 10 days?  No Do you have a fever?  No Do you have a cough?  No Do you have a headache or sore throat? No Do you have your Epi Pen visible and is it within date?  Yes 

## 2020-05-04 MED ORDER — XOLAIR 150 MG ~~LOC~~ SOLR: SUBCUTANEOUS | 1 refills | Status: DC

## 2020-05-04 NOTE — Telephone Encounter (Signed)
Called and spoke to [place] with [facility].  Dianna stated that there are no refills on file for Xolair, however she does not see where this medication was denied by our office.  Verbal for Xolair 375 q2w with 1 refill has been given to pharmacist, Gwyndolyn Saxon.  Nothing further is needed at this time.

## 2020-05-07 ENCOUNTER — Telehealth: Payer: Self-pay | Admitting: Pulmonary Disease

## 2020-05-07 NOTE — Telephone Encounter (Signed)
Xolair Order: # vials: 6 Ordered date: 05/07/2020 Expected date of arrival: 05/09/2020 Ordered by: Desmond Dike, [place]  Specialty Pharmacy: [facility]

## 2020-05-09 NOTE — Telephone Encounter (Signed)
Xolair Received: # Vials: 6 Medication arrival date: 05/09/2020 Lot #: [id number] Exp date: 12/2023 Received by: Desmond Dike, [place]

## 2020-05-15 ENCOUNTER — Ambulatory Visit (INDEPENDENT_AMBULATORY_CARE_PROVIDER_SITE_OTHER): Payer: Medicare PPO

## 2020-05-15 ENCOUNTER — Other Ambulatory Visit: Payer: Self-pay

## 2020-05-15 DIAGNOSIS — J4540 Moderate persistent asthma, uncomplicated: Secondary | ICD-10-CM | POA: Diagnosis not present

## 2020-05-15 MED ORDER — OMALIZUMAB 150 MG ~~LOC~~ SOLR
375.0000 mg | SUBCUTANEOUS | Status: DC
  Administered 2020-05-15: 375 mg via SUBCUTANEOUS

## 2020-05-15 NOTE — Progress Notes (Signed)
All questions were answered by the patient before medication was administered. Have you been hospitalized in the last 10 days? No Do you have a fever? No Do you have a cough? No Do you have a headache or sore throat? No  

## 2020-05-21 IMAGING — CT CT ABDOMEN AND PELVIS WITHOUT CONTRAST
2 of 4 series · 16 of 46 positions shown, 18 images · non-contrast
Comparison: CT 02/28/2010

CLINICAL DATA: Diarrhea

EXAM:
CT ABDOMEN AND PELVIS WITHOUT CONTRAST
TECHNIQUE: Multidetector CT imaging of the abdomen and pelvis was performed
following the standard protocol without IV contrast.

[Series 2: axial st · axial · 0.88mm/px · z∈[-623,-128]mm · 13 of 109 slices shown, 15 images]
[im 5/109  soft-tissue]
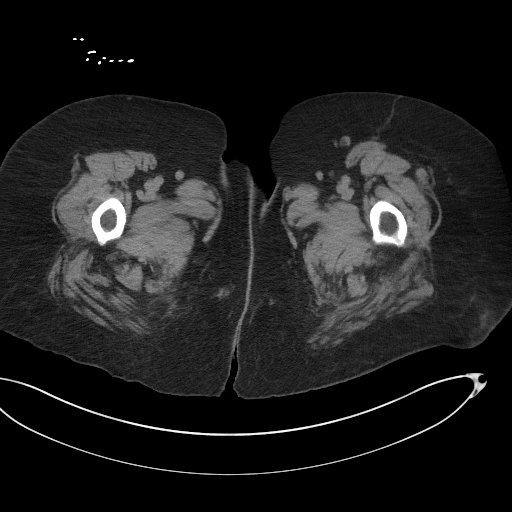
[im 5/109  bone]
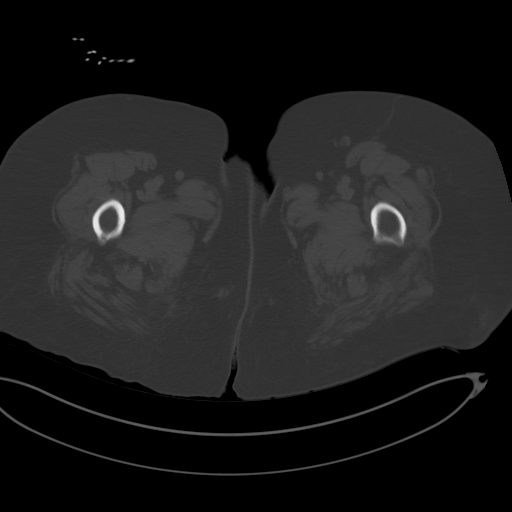
[im 13/109  soft-tissue]
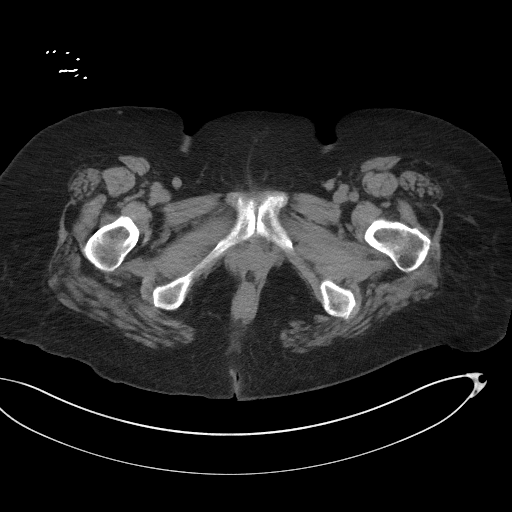
[im 22/109  soft-tissue]
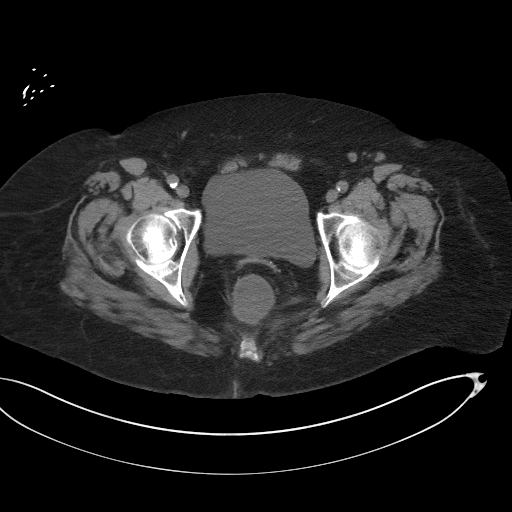
[im 31/109  soft-tissue]
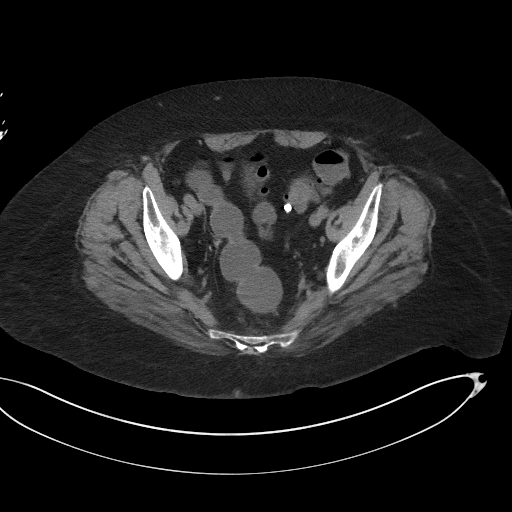
[im 39/109  soft-tissue]
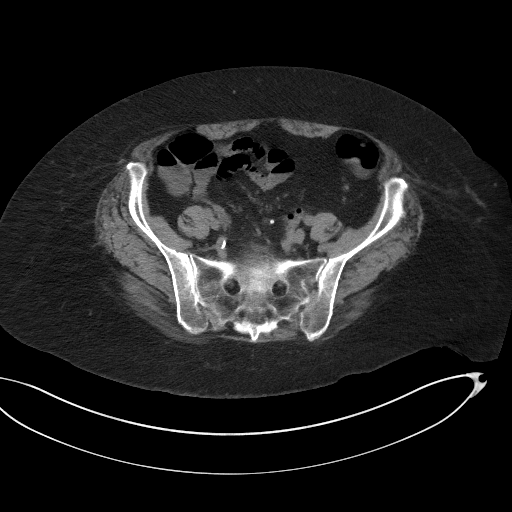
[im 48/109  soft-tissue]
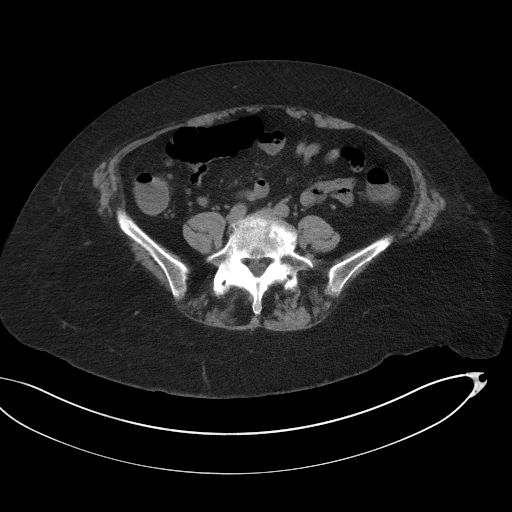
[im 57/109  soft-tissue]
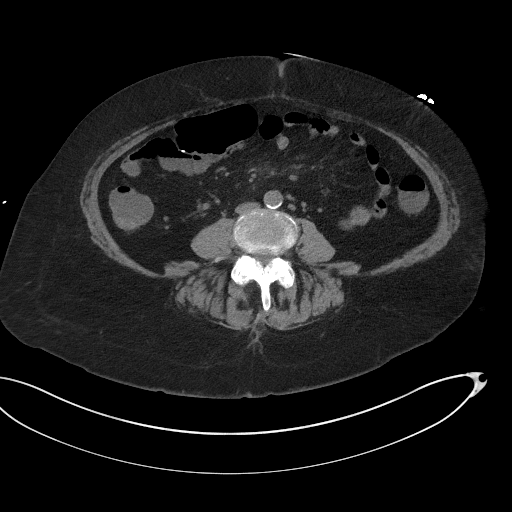
[im 61/109  soft-tissue]
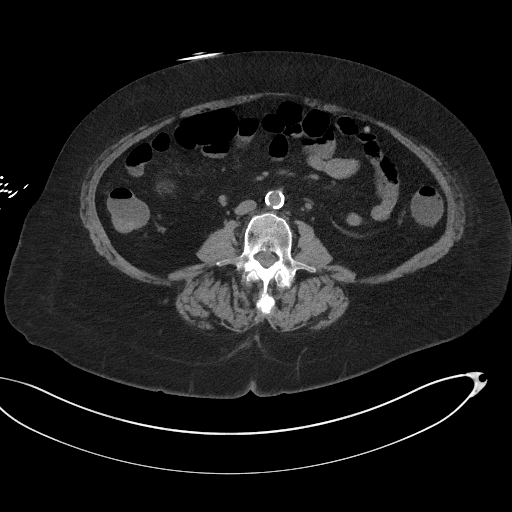
[im 70/109  soft-tissue]
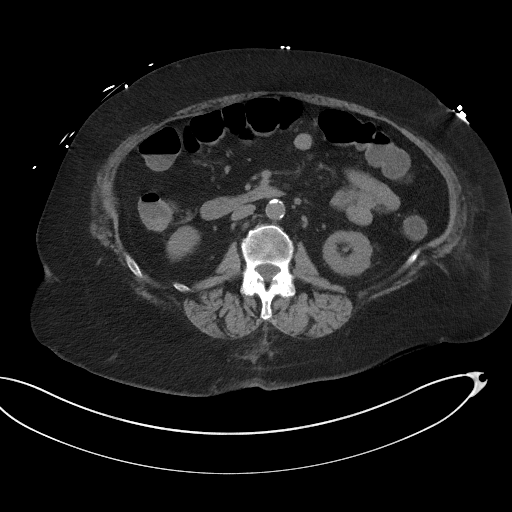
[im 70/109  bone]
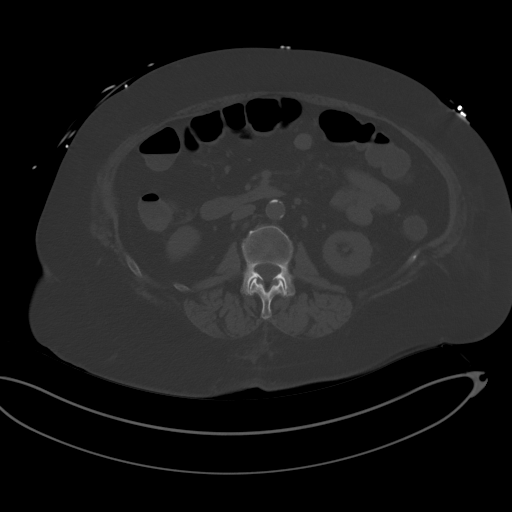
[im 78/109  soft-tissue]
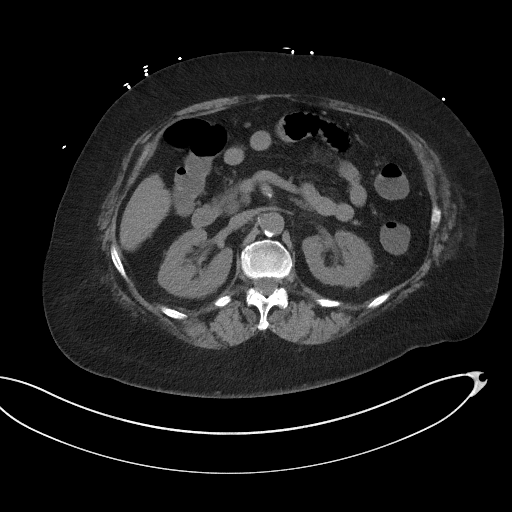
[im 87/109  soft-tissue]
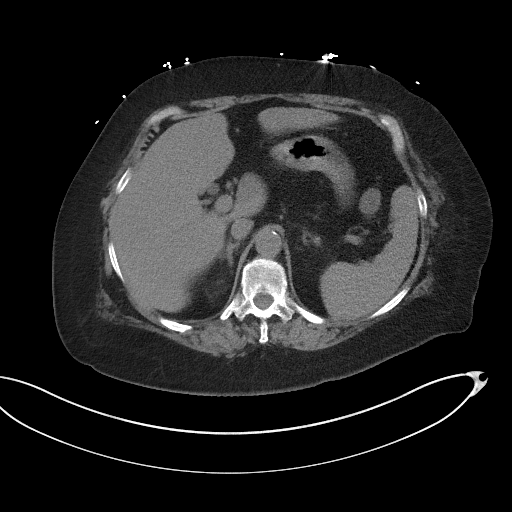
[im 96/109  soft-tissue]
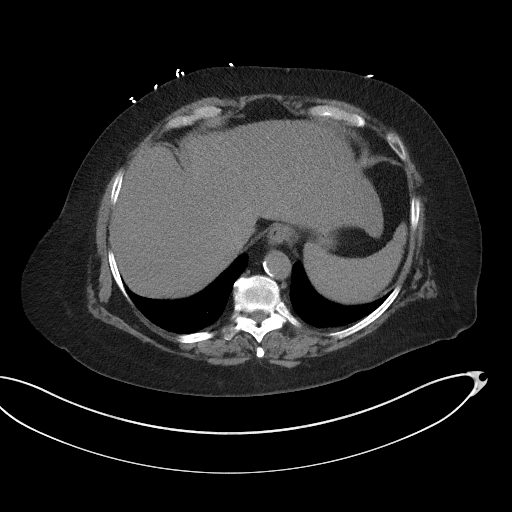
[im 104/109  soft-tissue]
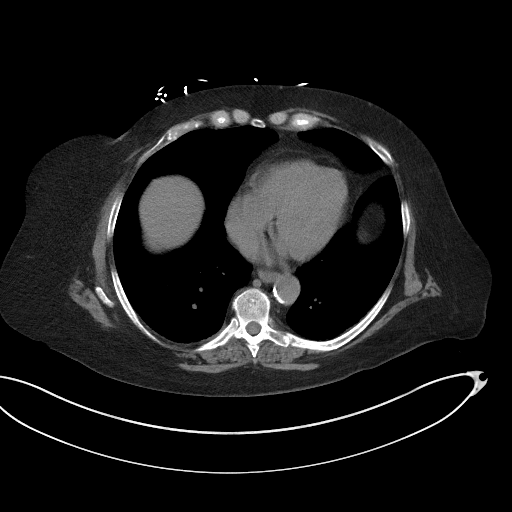

[Series 5: coronal st · coronal · 0.83mm/px · 3 of 81 slices shown]
[im 27/81  soft-tissue]
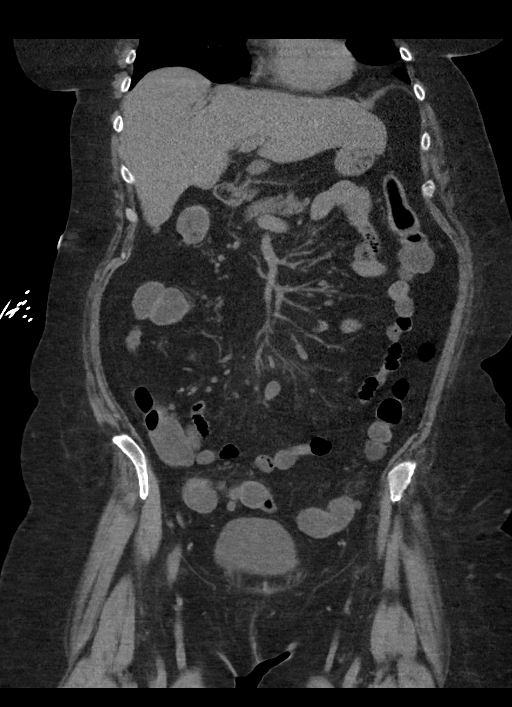
[im 36/81  soft-tissue]
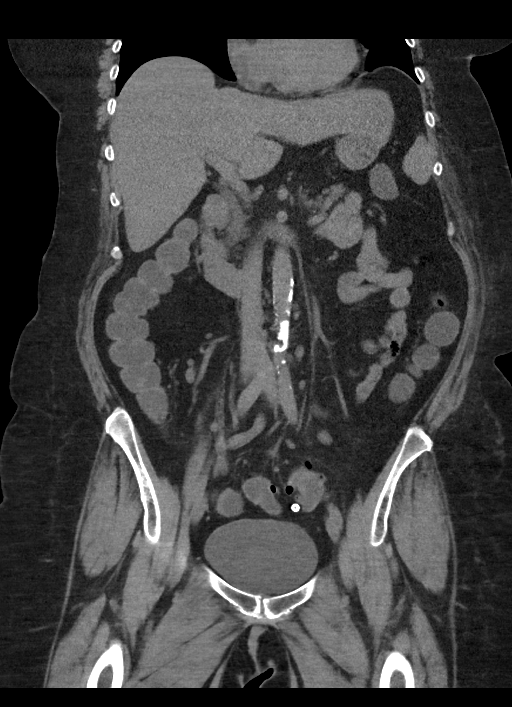
[im 45/81  soft-tissue]
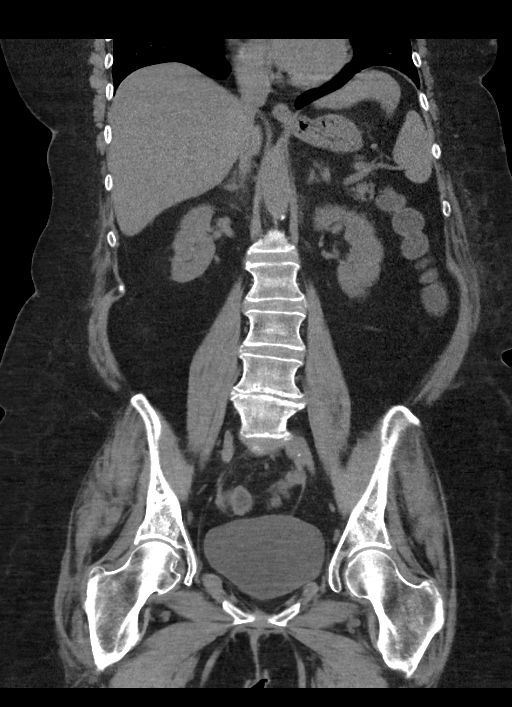

[16 of 46 positions shown; findings below may reference images not displayed]

FINDINGS: Lower chest: Lung bases demonstrate no acute consolidation or
effusion. The heart size is normal.

Hepatobiliary: No focal liver abnormality is seen. Status post
cholecystectomy. No biliary dilatation.

Pancreas: Unremarkable. No pancreatic ductal dilatation or
surrounding inflammatory changes.

Spleen: Borderline enlarged

Adrenals/Urinary Tract: Adrenal glands are unremarkable. Kidneys are
normal, without renal calculi, focal lesion, or hydronephrosis.
Bladder is unremarkable.

Stomach/Bowel: The stomach is within normal limits. No dilated small
bowel. Liquid stools in the colon. Scattered sigmoid colon
diverticula. No acute colon wall thickening.

Vascular/Lymphatic: Moderate aortic atherosclerosis. No aneurysm.
Scattered mesenteric and right lower quadrant lymph nodes.

Reproductive: Status post hysterectomy. No adnexal masses.

Other: Negative for free air or free fluid

Musculoskeletal: Degenerative changes. No acute or suspicious
abnormality.
IMPRESSION: 1. Diffuse liquid stools in the colon consistent with diarrheal
illness. No colon wall thickening.
2. Scattered sigmoid colon diverticula without acute inflammatory
change.

## 2020-05-31 ENCOUNTER — Ambulatory Visit (INDEPENDENT_AMBULATORY_CARE_PROVIDER_SITE_OTHER): Payer: Medicare PPO

## 2020-05-31 ENCOUNTER — Other Ambulatory Visit: Payer: Self-pay

## 2020-05-31 DIAGNOSIS — J4540 Moderate persistent asthma, uncomplicated: Secondary | ICD-10-CM | POA: Diagnosis not present

## 2020-05-31 MED ORDER — OMALIZUMAB 150 MG ~~LOC~~ SOLR
375.0000 mg | Freq: Once | SUBCUTANEOUS | Status: AC
  Administered 2020-05-31: 375 mg via SUBCUTANEOUS

## 2020-05-31 NOTE — Progress Notes (Signed)
Have you been hospitalized within the last 10 days?  No Do you have a fever?  No Do you have a cough?  No Do you have a headache or sore throat? No Do you have your Epi Pen visible and is it within date?  No

## 2020-06-14 IMAGING — DX PORTABLE CHEST - 1 VIEW
1 series · 1 of 1 positions shown · non-contrast
Comparison: None.

CLINICAL DATA: Hypotension

EXAM:
PORTABLE CHEST 1 VIEW

[chest ap]
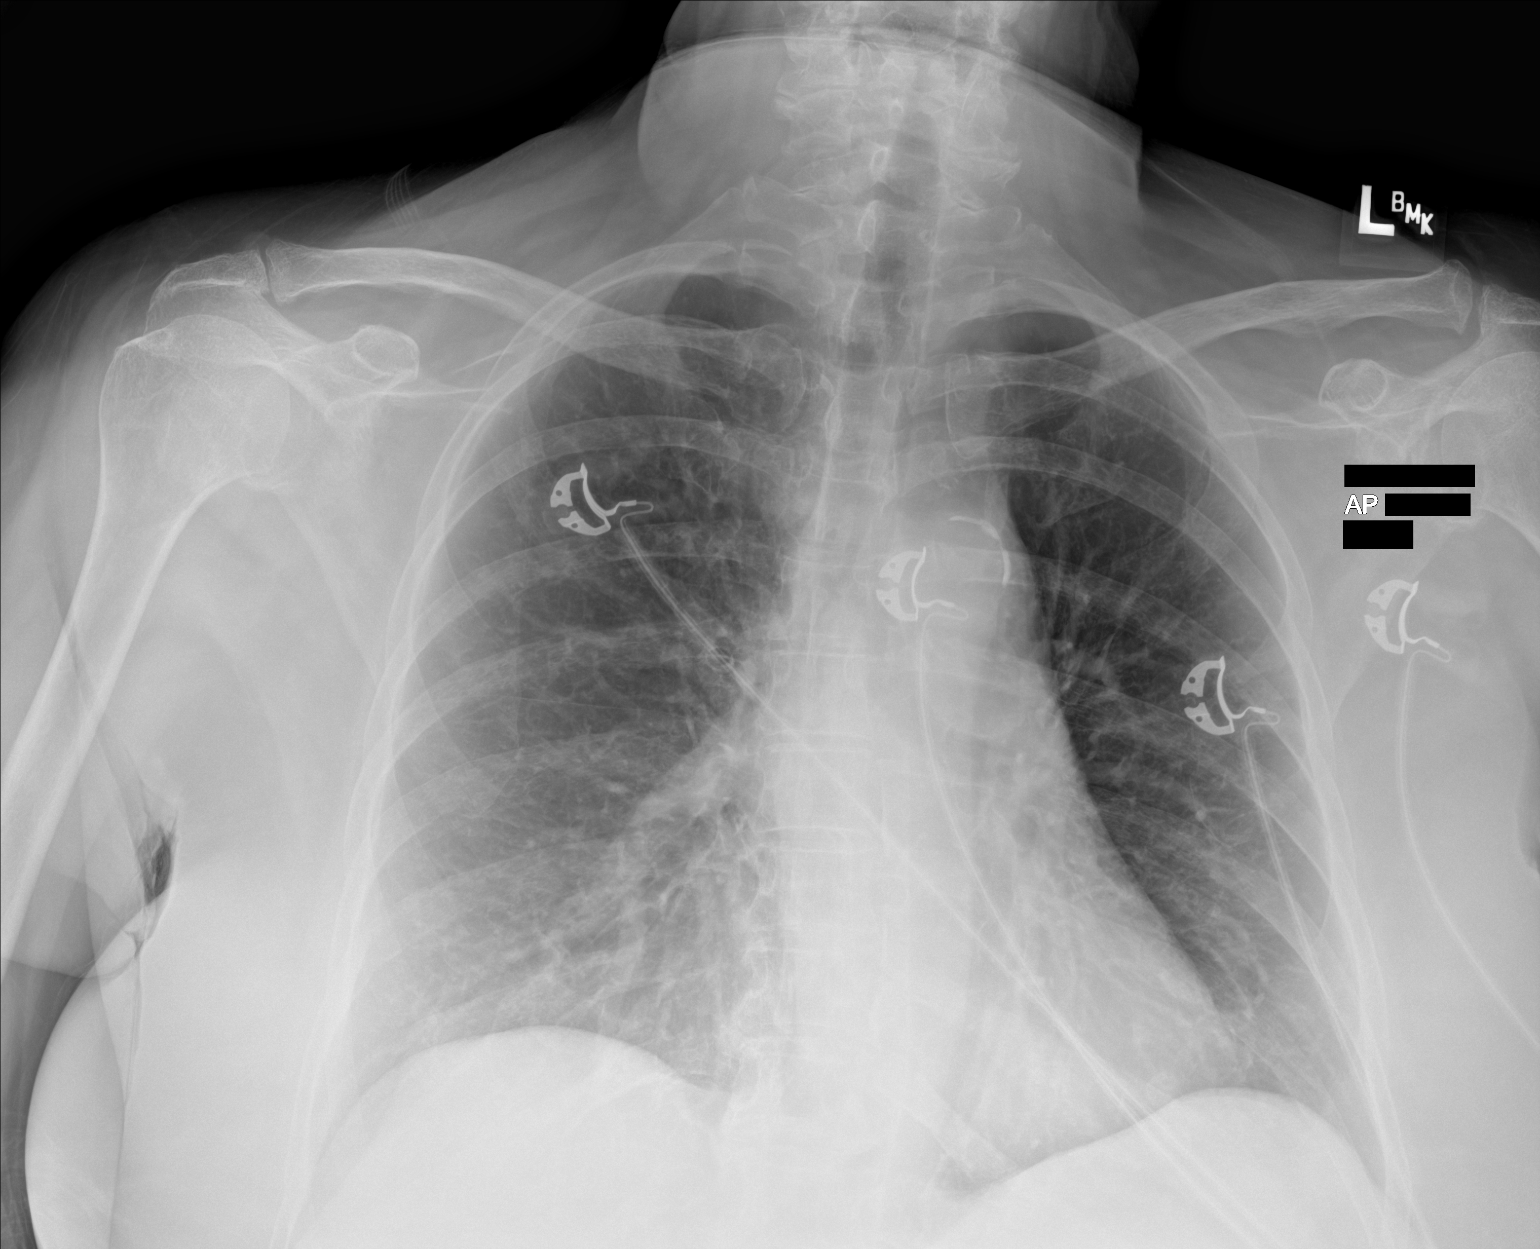

[1 of 1 positions shown; findings below may reference images not displayed]

FINDINGS: The heart size and mediastinal contours are within normal limits.
Both lungs are clear. The visualized skeletal structures are
unremarkable.
IMPRESSION: No active disease.

## 2020-11-22 ENCOUNTER — Other Ambulatory Visit: Payer: Self-pay | Admitting: Pulmonary Disease

## 2020-11-23 DIAGNOSIS — K559 Vascular disorder of intestine, unspecified: Secondary | ICD-10-CM | POA: Insufficient documentation

## 2020-11-28 ENCOUNTER — Other Ambulatory Visit: Payer: Self-pay

## 2020-11-28 ENCOUNTER — Inpatient Hospital Stay
Admission: EM | Admit: 2020-11-28 | Discharge: 2020-11-30 | DRG: 392 | Disposition: A | Discharge status: Home / Self Care | Payer: Medicare PPO | Attending: Internal Medicine | Admitting: Internal Medicine

## 2020-11-28 ENCOUNTER — Emergency Department: Payer: Medicare PPO

## 2020-11-28 ENCOUNTER — Encounter: Payer: Self-pay | Admitting: Internal Medicine

## 2020-11-28 DIAGNOSIS — G20 Parkinson's disease: Secondary | ICD-10-CM | POA: Diagnosis present

## 2020-11-28 DIAGNOSIS — K219 Gastro-esophageal reflux disease without esophagitis: Secondary | ICD-10-CM | POA: Diagnosis present

## 2020-11-28 DIAGNOSIS — K625 Hemorrhage of anus and rectum: Secondary | ICD-10-CM | POA: Diagnosis not present

## 2020-11-28 DIAGNOSIS — Z7982 Long term (current) use of aspirin: Secondary | ICD-10-CM

## 2020-11-28 DIAGNOSIS — E876 Hypokalemia: Secondary | ICD-10-CM | POA: Diagnosis present

## 2020-11-28 DIAGNOSIS — Z7951 Long term (current) use of inhaled steroids: Secondary | ICD-10-CM

## 2020-11-28 DIAGNOSIS — I4891 Unspecified atrial fibrillation: Secondary | ICD-10-CM | POA: Diagnosis present

## 2020-11-28 DIAGNOSIS — K52839 Microscopic colitis, unspecified: Secondary | ICD-10-CM | POA: Insufficient documentation

## 2020-11-28 DIAGNOSIS — K640 First degree hemorrhoids: Secondary | ICD-10-CM | POA: Diagnosis present

## 2020-11-28 DIAGNOSIS — K2970 Gastritis, unspecified, without bleeding: Secondary | ICD-10-CM | POA: Diagnosis present

## 2020-11-28 DIAGNOSIS — Z20822 Contact with and (suspected) exposure to covid-19: Secondary | ICD-10-CM | POA: Diagnosis present

## 2020-11-28 DIAGNOSIS — Z96652 Presence of left artificial knee joint: Secondary | ICD-10-CM | POA: Diagnosis present

## 2020-11-28 DIAGNOSIS — Z79899 Other long term (current) drug therapy: Secondary | ICD-10-CM

## 2020-11-28 DIAGNOSIS — Z8673 Personal history of transient ischemic attack (TIA), and cerebral infarction without residual deficits: Secondary | ICD-10-CM | POA: Diagnosis not present

## 2020-11-28 DIAGNOSIS — Z91013 Allergy to seafood: Secondary | ICD-10-CM

## 2020-11-28 DIAGNOSIS — Z8249 Family history of ischemic heart disease and other diseases of the circulatory system: Secondary | ICD-10-CM

## 2020-11-28 DIAGNOSIS — Z882 Allergy status to sulfonamides status: Secondary | ICD-10-CM

## 2020-11-28 DIAGNOSIS — K922 Gastrointestinal hemorrhage, unspecified: Secondary | ICD-10-CM | POA: Diagnosis present

## 2020-11-28 DIAGNOSIS — F418 Other specified anxiety disorders: Secondary | ICD-10-CM | POA: Diagnosis present

## 2020-11-28 DIAGNOSIS — E119 Type 2 diabetes mellitus without complications: Secondary | ICD-10-CM | POA: Diagnosis present

## 2020-11-28 DIAGNOSIS — N179 Acute kidney failure, unspecified: Secondary | ICD-10-CM | POA: Diagnosis not present

## 2020-11-28 DIAGNOSIS — K52831 Collagenous colitis: Secondary | ICD-10-CM | POA: Diagnosis present

## 2020-11-28 DIAGNOSIS — J449 Chronic obstructive pulmonary disease, unspecified: Secondary | ICD-10-CM | POA: Diagnosis present

## 2020-11-28 DIAGNOSIS — Z66 Do not resuscitate: Secondary | ICD-10-CM | POA: Diagnosis present

## 2020-11-28 DIAGNOSIS — Z83438 Family history of other disorder of lipoprotein metabolism and other lipidemia: Secondary | ICD-10-CM | POA: Diagnosis not present

## 2020-11-28 DIAGNOSIS — K529 Noninfective gastroenteritis and colitis, unspecified: Secondary | ICD-10-CM | POA: Insufficient documentation

## 2020-11-28 DIAGNOSIS — I639 Cerebral infarction, unspecified: Secondary | ICD-10-CM | POA: Diagnosis not present

## 2020-11-28 DIAGNOSIS — I10 Essential (primary) hypertension: Secondary | ICD-10-CM | POA: Diagnosis present

## 2020-11-28 DIAGNOSIS — F32A Depression, unspecified: Secondary | ICD-10-CM | POA: Diagnosis present

## 2020-11-28 DIAGNOSIS — E785 Hyperlipidemia, unspecified: Secondary | ICD-10-CM | POA: Diagnosis present

## 2020-11-28 DIAGNOSIS — Z79890 Hormone replacement therapy (postmenopausal): Secondary | ICD-10-CM

## 2020-11-28 DIAGNOSIS — Z9109 Other allergy status, other than to drugs and biological substances: Secondary | ICD-10-CM

## 2020-11-28 DIAGNOSIS — Z7952 Long term (current) use of systemic steroids: Secondary | ICD-10-CM

## 2020-11-28 DIAGNOSIS — K449 Diaphragmatic hernia without obstruction or gangrene: Secondary | ICD-10-CM | POA: Diagnosis present

## 2020-11-28 DIAGNOSIS — Z833 Family history of diabetes mellitus: Secondary | ICD-10-CM

## 2020-11-28 DIAGNOSIS — I480 Paroxysmal atrial fibrillation: Secondary | ICD-10-CM | POA: Diagnosis present

## 2020-11-28 DIAGNOSIS — Z91041 Radiographic dye allergy status: Secondary | ICD-10-CM

## 2020-11-28 LAB — CBC WITH DIFFERENTIAL/PLATELET
Abs Immature Granulocytes: 0.05 10*3/uL (ref 0.00–0.07)
Basophils Absolute: 0 10*3/uL (ref 0.0–0.1)
Basophils Relative: 0 %
Eosinophils Absolute: 0 10*3/uL (ref 0.0–0.5)
Eosinophils Relative: 0 %
HCT: 40.6 % (ref 36.0–46.0)
Hemoglobin: 13.2 g/dL (ref 12.0–15.0)
Immature Granulocytes: 1 %
Lymphocytes Relative: 6 %
Lymphs Abs: 0.7 10*3/uL (ref 0.7–4.0)
MCH: 30.6 pg (ref 26.0–34.0)
MCHC: 32.5 g/dL (ref 30.0–36.0)
MCV: 94.2 fL (ref 80.0–100.0)
Monocytes Absolute: 0.7 10*3/uL (ref 0.1–1.0)
Monocytes Relative: 7 %
Neutro Abs: 9 10*3/uL — ABNORMAL HIGH (ref 1.7–7.7)
Neutrophils Relative %: 86 %
Platelets: 194 10*3/uL (ref 150–400)
RBC: 4.31 MIL/uL (ref 3.87–5.11)
RDW: 14.6 % (ref 11.5–15.5)
WBC: 10.5 10*3/uL (ref 4.0–10.5)
nRBC: 0 % (ref 0.0–0.2)

## 2020-11-28 LAB — CBC
HCT: 37.7 % (ref 36.0–46.0)
HCT: 38.3 % (ref 36.0–46.0)
HCT: 36.3 % (ref 36.0–46.0)
Hemoglobin: 12.7 g/dL (ref 12.0–15.0)
Hemoglobin: 12.5 g/dL (ref 12.0–15.0)
Hemoglobin: 12 g/dL (ref 12.0–15.0)
MCH: 32.1 pg (ref 26.0–34.0)
MCH: 31 pg (ref 26.0–34.0)
MCH: 31.3 pg (ref 26.0–34.0)
MCHC: 33.7 g/dL (ref 30.0–36.0)
MCHC: 32.6 g/dL (ref 30.0–36.0)
MCHC: 33.1 g/dL (ref 30.0–36.0)
MCV: 95.2 fL (ref 80.0–100.0)
MCV: 95 fL (ref 80.0–100.0)
MCV: 94.5 fL (ref 80.0–100.0)
Platelets: 162 10*3/uL (ref 150–400)
Platelets: 142 10*3/uL — ABNORMAL LOW (ref 150–400)
Platelets: 151 10*3/uL (ref 150–400)
RBC: 3.96 MIL/uL (ref 3.87–5.11)
RBC: 4.03 MIL/uL (ref 3.87–5.11)
RBC: 3.84 MIL/uL — ABNORMAL LOW (ref 3.87–5.11)
RDW: 14.7 % (ref 11.5–15.5)
RDW: 14.8 % (ref 11.5–15.5)
RDW: 14.9 % (ref 11.5–15.5)
WBC: 9.8 10*3/uL (ref 4.0–10.5)
WBC: 9.8 10*3/uL (ref 4.0–10.5)
WBC: 9.8 10*3/uL (ref 4.0–10.5)
nRBC: 0 % (ref 0.0–0.2)
nRBC: 0 % (ref 0.0–0.2)
nRBC: 0 % (ref 0.0–0.2)

## 2020-11-28 LAB — GASTROINTESTINAL PANEL BY PCR, STOOL (REPLACES STOOL CULTURE)

## 2020-11-28 LAB — SAMPLE TO BLOOD BANK

## 2020-11-28 LAB — COMPREHENSIVE METABOLIC PANEL
ALT: 14 U/L (ref 0–44)
AST: 27 U/L (ref 15–41)
Albumin: 3.5 g/dL (ref 3.5–5.0)
Alkaline Phosphatase: 65 U/L (ref 38–126)
Anion gap: 10 (ref 5–15)
BUN: 21 mg/dL (ref 8–23)
CO2: 28 mmol/L (ref 22–32)
Calcium: 9.6 mg/dL (ref 8.9–10.3)
Chloride: 98 mmol/L (ref 98–111)
Creatinine, Ser: 1.17 mg/dL — ABNORMAL HIGH (ref 0.44–1.00)
GFR, Estimated: 50 mL/min — ABNORMAL LOW (ref >60)
Glucose, Bld: 169 mg/dL — ABNORMAL HIGH (ref 70–99)
Potassium: 3.9 mmol/L (ref 3.5–5.1)
Sodium: 136 mmol/L (ref 135–145)
Total Bilirubin: 0.9 mg/dL (ref 0.3–1.2)
Total Protein: 6.8 g/dL (ref 6.5–8.1)

## 2020-11-28 LAB — RESP PANEL BY RT-PCR (FLU A&B, COVID) ARPGX2
Influenza A by PCR: NEGATIVE
Influenza B by PCR: NEGATIVE
SARS Coronavirus 2 by RT PCR: NEGATIVE

## 2020-11-28 LAB — BRAIN NATRIURETIC PEPTIDE: B Natriuretic Peptide: 69.4 pg/mL (ref 0.0–100.0)

## 2020-11-28 LAB — PROTIME-INR
INR: 1 (ref 0.8–1.2)
Prothrombin Time: 13.1 seconds (ref 11.4–15.2)

## 2020-11-28 LAB — LIPASE, BLOOD: Lipase: 20 U/L (ref 11–51)

## 2020-11-28 LAB — APTT: aPTT: 25 seconds (ref 24–36)

## 2020-11-28 MED ORDER — ONDANSETRON HCL 4 MG/2ML IJ SOLN
4.0000 mg | Freq: Three times a day (TID) | INTRAMUSCULAR | Status: DC | PRN
  Administered 2020-11-28: 4 mg via INTRAVENOUS
  Filled 2020-11-28: qty 2

## 2020-11-28 MED ORDER — PEG 3350-KCL-NA BICARB-NACL 420 G PO SOLR
4000.0000 mL | Freq: Once | ORAL | Status: DC
  Filled 2020-11-28: qty 4000

## 2020-11-28 MED ORDER — DM-GUAIFENESIN ER 30-600 MG PO TB12: 1.0000 | ORAL_TABLET | Freq: Two times a day (BID) | ORAL | Status: DC | PRN

## 2020-11-28 MED ORDER — MOMETASONE FURO-FORMOTEROL FUM 200-5 MCG/ACT IN AERO
2.0000 | INHALATION_SPRAY | Freq: Two times a day (BID) | RESPIRATORY_TRACT | Status: DC
  Filled 2020-11-28: qty 8.8

## 2020-11-28 MED ORDER — DILTIAZEM HCL ER COATED BEADS 180 MG PO CP24
180.0000 mg | ORAL_CAPSULE | Freq: Every day | ORAL | Status: DC
  Administered 2020-11-29 – 2020-11-30 (×2): 180 mg via ORAL
  Filled 2020-11-28 (×2): qty 1

## 2020-11-28 MED ORDER — CARBOXYMETHYLCELLULOSE SODIUM 0.5 % OP SOLN: 1.0000 [drp] | Freq: Two times a day (BID) | OPHTHALMIC | Status: DC

## 2020-11-28 MED ORDER — ACETAMINOPHEN 325 MG PO TABS
650.0000 mg | ORAL_TABLET | Freq: Four times a day (QID) | ORAL | Status: DC | PRN
  Administered 2020-11-28 – 2020-11-29 (×2): 650 mg via ORAL
  Filled 2020-11-28 (×2): qty 2

## 2020-11-28 MED ORDER — ALPRAZOLAM 0.5 MG PO TABS
0.5000 mg | ORAL_TABLET | Freq: Every day | ORAL | Status: DC
  Administered 2020-11-28 – 2020-11-29 (×2): 0.5 mg via ORAL
  Filled 2020-11-28 (×2): qty 1

## 2020-11-28 MED ORDER — LORATADINE 10 MG PO TABS
10.0000 mg | ORAL_TABLET | Freq: Every day | ORAL | Status: DC
  Administered 2020-11-28 – 2020-11-29 (×2): 10 mg via ORAL
  Filled 2020-11-28 (×2): qty 1

## 2020-11-28 MED ORDER — PEG 3350-KCL-NA BICARB-NACL 420 G PO SOLR
4000.0000 mL | Freq: Once | ORAL | Status: AC
  Administered 2020-11-28: 4000 mL via ORAL
  Filled 2020-11-28: qty 4000

## 2020-11-28 MED ORDER — CARBIDOPA-LEVODOPA 25-100 MG PO TABS
2.0000 | ORAL_TABLET | Freq: Three times a day (TID) | ORAL | Status: DC
  Administered 2020-11-28 – 2020-11-30 (×5): 2 via ORAL
  Filled 2020-11-28 (×9): qty 2

## 2020-11-28 MED ORDER — ALBUTEROL SULFATE HFA 108 (90 BASE) MCG/ACT IN AERS
2.0000 | INHALATION_SPRAY | RESPIRATORY_TRACT | Status: DC | PRN
  Filled 2020-11-28: qty 6.7

## 2020-11-28 MED ORDER — POLYVINYL ALCOHOL 1.4 % OP SOLN
1.0000 [drp] | Freq: Two times a day (BID) | OPHTHALMIC | Status: DC
  Administered 2020-11-29 – 2020-11-30 (×3): 1 [drp] via OPHTHALMIC
  Filled 2020-11-28: qty 15

## 2020-11-28 MED ORDER — HYOSCYAMINE SULFATE 0.125 MG PO TABS
0.1250 mg | ORAL_TABLET | ORAL | Status: DC | PRN
  Filled 2020-11-28: qty 1

## 2020-11-28 MED ORDER — SODIUM CHLORIDE 0.9 % IV BOLUS
500.0000 mL | Freq: Once | INTRAVENOUS | Status: AC
  Administered 2020-11-28: 500 mL via INTRAVENOUS

## 2020-11-28 MED ORDER — PRAZOSIN HCL 1 MG PO CAPS
1.0000 mg | ORAL_CAPSULE | Freq: Every day | ORAL | Status: DC
  Administered 2020-11-28 – 2020-11-29 (×2): 1 mg via ORAL
  Filled 2020-11-28 (×4): qty 1

## 2020-11-28 MED ORDER — ROSUVASTATIN CALCIUM 10 MG PO TABS
40.0000 mg | ORAL_TABLET | Freq: Every day | ORAL | Status: DC
  Administered 2020-11-28 – 2020-11-29 (×2): 40 mg via ORAL
  Filled 2020-11-28 (×2): qty 4

## 2020-11-28 MED ORDER — HYDRALAZINE HCL 20 MG/ML IJ SOLN: 5.0000 mg | INTRAMUSCULAR | Status: DC | PRN

## 2020-11-28 MED ORDER — PIPERACILLIN-TAZOBACTAM 3.375 G IVPB
3.3750 g | Freq: Three times a day (TID) | INTRAVENOUS | Status: DC
  Administered 2020-11-28 – 2020-11-29 (×4): 3.375 g via INTRAVENOUS
  Filled 2020-11-28 (×4): qty 50

## 2020-11-28 MED ORDER — BUSPIRONE HCL 5 MG PO TABS
5.0000 mg | ORAL_TABLET | Freq: Three times a day (TID) | ORAL | Status: DC
  Administered 2020-11-28 – 2020-11-30 (×5): 5 mg via ORAL
  Filled 2020-11-28 (×9): qty 1

## 2020-11-28 MED ORDER — SERTRALINE HCL 50 MG PO TABS
150.0000 mg | ORAL_TABLET | Freq: Every day | ORAL | Status: DC
  Administered 2020-11-29 – 2020-11-30 (×2): 150 mg via ORAL
  Filled 2020-11-28 (×2): qty 3

## 2020-11-28 MED ORDER — SODIUM CHLORIDE 0.9 % IV SOLN: INTRAVENOUS | Status: DC

## 2020-11-28 MED ORDER — BUDESONIDE 3 MG PO CPEP
3.0000 mg | ORAL_CAPSULE | Freq: Every day | ORAL | Status: DC
  Administered 2020-11-29 – 2020-11-30 (×2): 3 mg via ORAL
  Filled 2020-11-28 (×2): qty 1

## 2020-11-28 MED ORDER — PANTOPRAZOLE SODIUM 40 MG IV SOLR
40.0000 mg | Freq: Two times a day (BID) | INTRAVENOUS | Status: DC
  Administered 2020-11-28: 40 mg via INTRAVENOUS

## 2020-11-28 MED ORDER — MEMANTINE HCL 5 MG PO TABS
10.0000 mg | ORAL_TABLET | Freq: Two times a day (BID) | ORAL | Status: DC
  Administered 2020-11-28 – 2020-11-30 (×3): 10 mg via ORAL
  Filled 2020-11-28 (×4): qty 2

## 2020-11-28 MED ORDER — METOPROLOL TARTRATE 50 MG PO TABS
50.0000 mg | ORAL_TABLET | Freq: Two times a day (BID) | ORAL | Status: DC
  Administered 2020-11-28 – 2020-11-30 (×4): 50 mg via ORAL
  Filled 2020-11-28 (×4): qty 1

## 2020-11-28 MED ORDER — PANTOPRAZOLE SODIUM 40 MG IV SOLR
40.0000 mg | Freq: Once | INTRAVENOUS | Status: DC
  Filled 2020-11-28: qty 40

## 2020-11-28 NOTE — H&P (Signed)
History and Physical    Caitlin Leonard UVO:[id number] DOB: 1946/08/18 DOA: 11/28/2020  Referring MD/NP/PA:   PCP: [place] Pink, MD   Patient coming from:  The patient is coming from home.  At baseline, pt is independent for most of ADL.        Chief Complaint: rectal bleeding and diarrhea  HPI: Caitlin Leonard is a 75 y.o. female with medical history significant of IBS, GIB, HTN, HLD, DM, COPD, stroke, A fib not on AC, OSA, depression with anxiety, collagenous colitis, Parkinson's disease, presents with rectal bleeding and diarrhea.   Pt states that she had constipation earlier this week, but since yesterday she started having diarrhea, which then turned into large volume bloody diarrhea.  She has had multiple episodes of bloody diarrhea since yesterday.  She also has lower abdominal pain, which is mild, sharp, nonradiating.  Denies fever or chills.  No nausea vomiting.  Patient does not have chest pain, shortness breath, cough, symptoms of UTI or unilateral weakness. She takes three advil PM almost every night   ED Course: pt was found to have hemoglobin 13.6 on 03/19/2020--> 13.2 -->12.5, WBC 10.5, INR 1.0, negative Covid PCR, mild AKI with creatinine 1.17, BUN 21, temperature normal, blood pressure 131/103, heart rate 70, RR 28, oxygen saturation 89% initially -->then 98% on RA. Pt is admitted to [place] bed as inpatient.  Dr. Haig Prophet for GI is consulted.   Review of Systems:   General: no fevers, chills, no body weight gain, has poor appetite HEENT: no blurry vision, hearing changes or sore throat Respiratory: no dyspnea, coughing, wheezing CV: no chest pain, no palpitations GI: no nausea, vomiting, has bloody diarrhea and abdominal pain, no constipation GU: no dysuria, burning on urination, increased urinary frequency, hematuria  Ext: no leg edema Neuro: no unilateral weakness, numbness, or tingling, no vision change or hearing loss Skin: no rash, no skin tear. MSK: No muscle  spasm, no deformity, no limitation of range of movement in spin Heme: No easy bruising.  Travel history: No recent long distant travel.  Allergy:  Allergies  Allergen Reactions  . Contrast Media [Iodinated Diagnostic Agents] Anaphylaxis  . Gadolinium Derivatives Anaphylaxis  . Gadoversetamide Anaphylaxis  . Shellfish Allergy Nausea And Vomiting and Other (See Comments)    Can eat shrimp but not oysters Dizziness,severe nausea and vomiting ( can eat shrimp CAN NOT EAT ORYSTERS) Other reaction(s): Other (See Comments) Dizziness,severe nausea and vomiting ( can eat shrimp CAN NOT EAT ORYSTERS)  Can eat shrimp but not oysters Dizziness,severe nausea and vomiting ( can eat shrimp CAN NOT EAT ORYSTERS)  Can eat shrimp but not oysters   . Sulfa Antibiotics Hives and Other (See Comments)    GI upset GI upset GI upset  GI upset GI upset GI upset Other reaction(s): Other (See Comments) GI upset GI upset  GI upset GI upset   . Hydroxychloroquine Sulfate Hives and Other (See Comments)    Severe hives, all over like chicken pox.  . Banana Nausea And Vomiting and Other (See Comments)    Raw bananas  . Limonene Other (See Comments)    GI upset GI upset   . Morphine And Related   . Nabumetone Other (See Comments)    Hypertension  . Otezla [Apremilast]   . Potassium-Containing Compounds Other (See Comments)    GI upset  . Prednisone Nausea And Vomiting  . Sulfasalazine Hives and Other (See Comments)    GI upset  . Sulfonamide Derivatives Hives  .  Metronidazole Rash  . Sulfamethoxazole Rash    Past Medical History:  Diagnosis Date  . Anemia   . Anxiety   . Arthritis    osteoarthritis  . Asthma    due to seasonal alllergies  . Atrial fibrillation ([place])   . Carpal tunnel syndrome   . Cataract   . Cervicalgia   . Collagenous colitis 2010  . COPD (chronic obstructive pulmonary disease) ([place])   . Depression    situational  . Diabetes mellitus without complication  ([place])   . Dysrhythmia   . GERD (gastroesophageal reflux disease)   . GI bleed   . Headache(784.0)    migraines; 2 x month  . Heart murmur    MVP ( symptomatic)  . History of IBS   . Hyperlipemia   . Hypertension    on medication x 10 years  . Migraine   . MVP (mitral valve prolapse)   . Shortness of breath    exertional  . Sinoatrial node dysfunction (HCC)   . Sleep apnea 2007   does not use CPAP since 40 # wt loss  . Stroke ([place])   . Syncopal episodes     Past Surgical History:  Procedure Laterality Date  . ABDOMINAL HYSTERECTOMY  1979  . BACK SURGERY    . BREAST CYST ASPIRATION Right 25 plus yrs ago  . CARDIAC CATHETERIZATION  2012   [facility]  . CHOLECYSTECTOMY  2011  . COLONOSCOPY WITH PROPOFOL N/A 03/25/2019   Procedure: COLONOSCOPY WITH PROPOFOL;  Surgeon: Lollie Sails, MD;  Location: [facility] ENDOSCOPY;  Service: Endoscopy;  Laterality: N/A;  . ESOPHAGOGASTRODUODENOSCOPY (EGD) WITH PROPOFOL N/A 03/25/2019   Procedure: ESOPHAGOGASTRODUODENOSCOPY (EGD) WITH PROPOFOL;  Surgeon: Lollie Sails, MD;  Location: [facility] ENDOSCOPY;  Service: Endoscopy;  Laterality: N/A;  . FLEXIBLE BRONCHOSCOPY N/A 10/10/2016   Procedure: FLEXIBLE BRONCHOSCOPY;  Surgeon: [name], MD;  Location: [facility] ORS;  Service: Pulmonary;  Laterality: N/A;  . FLEXIBLE BRONCHOSCOPY N/A 12/11/2017   Procedure: FLEXIBLE BRONCHOSCOPY;  Surgeon: [name], MD;  Location: [facility] ORS;  Service: Pulmonary;  Laterality: N/A;  . [place], 2012 x 2   bilateral  . LUMBAR LAMINECTOMY  [id number]  . NASAL SINUS SURGERY  1994  . TONSILLECTOMY    . TOTAL KNEE ARTHROPLASTY  12/22/2011   Procedure: TOTAL KNEE ARTHROPLASTY;  Surgeon: Rudean Haskell, MD;  Location: [place];  Service: Orthopedics;  Laterality: Left;    Social History:  reports that she has never smoked. She has never used smokeless tobacco. She reports current alcohol use of about 1.0 standard drink of  alcohol per week. She reports that she does not use drugs.  Family History:  Family History  Problem Relation Age of Onset  . Hypertension Mother   . Hyperlipidemia Mother   . Diabetes Mother   . Hypertension Father   . Hyperlipidemia Father   . Arrhythmia Father        A-Fib  . Diabetes Paternal Uncle   . Diabetes Paternal Grandfather   . Breast cancer Maternal Aunt        great in late 37's  . Anesthesia problems Neg Hx   . Colon cancer Neg Hx   . Stomach cancer Neg Hx   . Rectal cancer Neg Hx   . Liver cancer Neg Hx   . Esophageal cancer Neg Hx      Prior to Admission medications   Medication Sig Start  Date End Date Taking? Authorizing Milo Solana  albuterol (VENTOLIN HFA) 108 (90 Base) MCG/ACT inhaler Inhale 2 puffs into the lungs every 6 (six) hours as needed for wheezing or shortness of breath. 11/02/19   Tyler Pita, MD  ALPRAZolam Duanne Moron) 0.5 MG tablet Take 0.5 mg by mouth at bedtime.     Gerrick Ray, Historical, MD  aspirin EC 81 MG tablet Take 81 mg by mouth daily.    Susannah Carbin, Historical, MD  budesonide (ENTOCORT EC) 3 MG 24 hr capsule Take by mouth. 08/19/19   Cledith Kamiya, Historical, MD  budesonide-formoterol (SYMBICORT) 160-4.5 MCG/ACT inhaler Inhale 2 puffs into the lungs 2 (two) times daily for 1 day. 03/23/20 03/24/20  Tyler Pita, MD  carboxymethylcellulose (REFRESH PLUS) 0.5 % SOLN Place 1 drop into both eyes 2 (two) times daily.     Romin Divita, Historical, MD  cetirizine (ZYRTEC) 10 MG tablet TAKE 1 TABLET(10 MG) BY MOUTH AT BEDTIME 01/20/20   Tyler Pita, MD  diltiazem (DILACOR XR) 180 MG 24 hr capsule Take 180 mg by mouth daily.    Tahliyah Anagnos, Historical, MD  EPIPEN 2-PAK 0.3 MG/0.3ML SOAJ injection Inject 0.3 mg into the muscle once.     Benjamim Harnish, Historical, MD  hyoscyamine (LEVSIN) 0.125 MG tablet Take 0.125 mg by mouth every 4 (four) hours as needed.    Vilas Edgerly, Historical, MD  loperamide (IMODIUM) 2 MG capsule Take 1 capsule (2 mg total) by mouth  every 6 (six) hours as needed for diarrhea or loose stools. 05/04/19   Hillary Bow, MD  memantine (NAMENDA) 5 MG tablet Take 5 mg by mouth 2 (two) times daily. 03/13/19 03/12/20  Hoyte Ziebell, Historical, MD  metoprolol tartrate (LOPRESSOR) 50 MG tablet TAKE 1 TABLET(50 MG) BY MOUTH TWICE DAILY 11/24/18   [name], MD  omalizumab Arvid Right) 150 MG injection INJECT 375MG SUBCUTANEOUSLY EVERY 2 WEEKS (GIVEN AT MD  OFFICE, DISCARD UNUSED) 05/04/20   Tyler Pita, MD  omeprazole (PRILOSEC) 40 MG capsule TAKE 1 CAPSULE BY MOUTH DAILY AT [facility] TIME 05/30/19   [name], MD  ondansetron (ZOFRAN-ODT) 4 MG disintegrating tablet Take 1 tablet (4 mg total) by mouth every 8 (eight) hours as needed for nausea or vomiting. 04/27/19   Merlyn Lot, MD  prazosin (MINIPRESS) 1 MG capsule Take 1 mg by mouth at bedtime. 03/11/19 03/10/20  Leonel Mccollum, Historical, MD  rosuvastatin (CRESTOR) 40 MG tablet Take 1 tablet (40 mg total) by mouth daily. 11/11/16 04/30/19  Minna Merritts, MD  sertraline (ZOLOFT) 100 MG tablet Take 150 mg by mouth daily.     Zerick Prevette, Historical, MD    Physical Exam: Vitals:   11/28/20 0833 11/28/20 1100 11/28/20 1149 11/28/20 1611  BP: (!) 121/103 (!) 100/47 (!) 107/52 107/61  Pulse: 70 61 67 72  Resp: (!) _0 Temp:   97.9 F (36.6 C) (!) 97.5 F (36.4 C)  TempSrc:   Oral Oral  SpO2: (!) 89% 97% 94% 98%  Weight:      Height:       General: Not in acute distress HEENT:       Eyes: PERRL, EOMI, no scleral icterus.       ENT: No discharge from the ears and nose, no pharynx injection, no tonsillar enlargement.        Neck: No JVD, no bruit, no mass felt. Heme: No neck lymph node enlargement. Cardiac: S1/S2, RRR, No murmurs, No gallops or rubs. Respiratory: No rales, wheezing, rhonchi or rubs. GI:  Soft, nondistended, has tenderness in lower abdomen, no rebound pain, no organomegaly, BS present. GU: No hematuria Ext: No pitting leg edema bilaterally. 1+DP/PT pulse  bilaterally. Musculoskeletal: No joint deformities, No joint redness or warmth, no limitation of ROM in spin. Skin: No rashes.  Neuro: Alert, oriented X3, cranial nerves II-XII grossly intact, moves all extremities normally. ion to light touch intact. Brachial reflex 2+ bilaterally. Knee reflex 1+ bilaterally. Negative Babinski's sign. Normal finger to nose test. Psych: Patient is not psychotic, no suicidal or hemocidal ideation.  Labs on Admission: I have personally reviewed following labs and imaging studies  CBC: Recent Labs  Lab 11/28/20 0818 11/28/20 1218 11/28/20 1524  WBC 10.5 9.8 9.8  NEUTROABS 9.0*  --   --   HGB 13.2 12.7 12.5  HCT 40.6 37.7 38.3  MCV 94.2 95.2 95.0  PLT 194 162 161*   Basic Metabolic Panel: Recent Labs  Lab 11/28/20 0818  NA 136  K 3.9  CL 98  CO2 28  GLUCOSE 169*  BUN 21  CREATININE 1.17*  CALCIUM 9.6   GFR: Estimated Creatinine Clearance: 58.3 mL/min (A) (by C-G formula based on SCr of 1.17 mg/dL (H)). Liver Function Tests: Recent Labs  Lab 11/28/20 0818  AST 27  ALT 14  ALKPHOS 65  BILITOT 0.9  PROT 6.8  ALBUMIN 3.5   Recent Labs  Lab 11/28/20 0818  LIPASE 20   No results for input(s): AMMONIA in the last 168 hours. Coagulation Profile: Recent Labs  Lab 11/28/20 0818  INR 1.0   Cardiac Enzymes: No results for input(s): CKTOTAL, CKMB, CKMBINDEX, TROPONINI in the last 168 hours. BNP (last 3 results) No results for input(s): PROBNP in the last 8760 hours. HbA1C: No results for input(s): HGBA1C in the last 72 hours. CBG: No results for input(s): GLUCAP in the last 168 hours. Lipid Profile: No results for input(s): CHOL, HDL, LDLCALC, TRIG, CHOLHDL, LDLDIRECT in the last 72 hours. Thyroid Function Tests: No results for input(s): TSH, T4TOTAL, FREET4, T3FREE, THYROIDAB in the last 72 hours. Anemia Panel: No results for input(s): VITAMINB12, FOLATE, FERRITIN, TIBC, IRON, RETICCTPCT in the last 72 hours. Urine  analysis:    Component Value Date/Time   COLORURINE AMBER (A) 04/30/2019 0615   APPEARANCEUR HAZY (A) 04/30/2019 0615   LABSPEC 1.021 04/30/2019 0615   PHURINE 5.0 04/30/2019 0615   GLUCOSEU NEGATIVE 04/30/2019 0615   HGBUR NEGATIVE 04/30/2019 0615   BILIRUBINUR NEGATIVE 04/30/2019 0615   KETONESUR NEGATIVE 04/30/2019 0615   PROTEINUR 30 (A) 04/30/2019 0615   UROBILINOGEN 1.0 12/11/2011 1236   NITRITE NEGATIVE 04/30/2019 0615   LEUKOCYTESUR NEGATIVE 04/30/2019 0615   Sepsis Labs: _0 (procalcitonin:4,lacticidven:4) ) Recent Results (from the past 240 hour(s))  Resp Panel by RT-PCR (Flu A&B, Covid) Nasopharyngeal Swab     Status: None   Collection Time: 11/28/20  8:18 AM   Specimen: Nasopharyngeal Swab; Nasopharyngeal(NP) swabs in vial transport medium  Result Value Ref Range Status   SARS Coronavirus 2 by RT PCR NEGATIVE NEGATIVE Final    Comment: (NOTE) SARS-CoV-2 target nucleic acids are NOT DETECTED.  The SARS-CoV-2 RNA is generally detectable in upper respiratory specimens during the acute phase of infection. The lowest concentration of SARS-CoV-2 viral copies this assay can detect is 138 copies/mL. A negative result does not preclude SARS-Cov-2 infection and should not be used as the sole basis for treatment or other patient management decisions. A negative result may occur with  improper specimen collection/handling, submission of specimen other than nasopharyngeal  swab, presence of viral mutation(s) within the areas targeted by this assay, and inadequate number of viral copies(<138 copies/mL). A negative result must be combined with clinical observations, patient history, and epidemiological information. The expected result is Negative.  Fact Sheet for Patients:  EntrepreneurPulse.com.au  Fact Sheet for Healthcare Providers:  IncredibleEmployment.be  This test is no t yet approved or cleared by the [place] FDA and   has been authorized for detection and/or diagnosis of SARS-CoV-2 by FDA under an Emergency Use Authorization (EUA). This EUA will remain  in effect (meaning this test can be used) for the duration of the COVID-19 declaration under Section 564(b)(1) of the Act, 21 U.S.C.section 360bbb-3(b)(1), unless the authorization is terminated  or revoked sooner.       Influenza A by PCR NEGATIVE NEGATIVE Final   Influenza B by PCR NEGATIVE NEGATIVE Final    Comment: (NOTE) The Xpert Xpress SARS-CoV-2/FLU/RSV plus assay is intended as an aid in the diagnosis of influenza from Nasopharyngeal swab specimens and should not be used as a sole basis for treatment. Nasal washings and aspirates are unacceptable for Xpert Xpress SARS-CoV-2/FLU/RSV testing.  Fact Sheet for Patients: EntrepreneurPulse.com.au  Fact Sheet for Healthcare Providers: IncredibleEmployment.be  This test is not yet approved or cleared by the [place] FDA and has been authorized for detection and/or diagnosis of SARS-CoV-2 by FDA under an Emergency Use Authorization (EUA). This EUA will remain in effect (meaning this test can be used) for the duration of the COVID-19 declaration under Section 564(b)(1) of the Act, 21 U.S.C. section 360bbb-3(b)(1), unless the authorization is terminated or revoked.  Performed at [facility], [address] [place], [place] [address]   Gastrointestinal Panel by PCR , Stool     Status: None   Collection Time: 11/28/20 10:42 AM   Specimen: Stool  Result Value Ref Range Status   Campylobacter species NOT DETECTED NOT DETECTED Final   Plesimonas shigelloides NOT DETECTED NOT DETECTED Final   Salmonella species NOT DETECTED NOT DETECTED Final   Yersinia enterocolitica NOT DETECTED NOT DETECTED Final   Vibrio species NOT DETECTED NOT DETECTED Final   Vibrio cholerae NOT DETECTED NOT DETECTED Final   Enteroaggregative E coli (EAEC) NOT  DETECTED NOT DETECTED Final   Enteropathogenic E coli (EPEC) NOT DETECTED NOT DETECTED Final   Enterotoxigenic E coli (ETEC) NOT DETECTED NOT DETECTED Final   Shiga like toxin producing E coli (STEC) NOT DETECTED NOT DETECTED Final   Shigella/Enteroinvasive E coli (EIEC) NOT DETECTED NOT DETECTED Final   Cryptosporidium NOT DETECTED NOT DETECTED Final   Cyclospora cayetanensis NOT DETECTED NOT DETECTED Final   Entamoeba histolytica NOT DETECTED NOT DETECTED Final   Giardia lamblia NOT DETECTED NOT DETECTED Final   Adenovirus F40/41 NOT DETECTED NOT DETECTED Final   Astrovirus NOT DETECTED NOT DETECTED Final   Norovirus GI/GII NOT DETECTED NOT DETECTED Final   Rotavirus A NOT DETECTED NOT DETECTED Final   Sapovirus (I, II, IV, and V) NOT DETECTED NOT DETECTED Final    Comment: Performed at [facility], [address], [place], [place] [address]     Radiological Exams on Admission: CT ABDOMEN PELVIS WO CONTRAST  Result Date: 11/28/2020 CLINICAL DATA:  Acute epigastric abdominal pain.  Rectal bleeding. EXAM: CT ABDOMEN AND PELVIS WITHOUT CONTRAST TECHNIQUE: Multidetector CT imaging of the abdomen and pelvis was performed following the standard protocol without IV contrast. COMPARISON:  April 27, 2019. FINDINGS: Lower chest: No acute abnormality. Hepatobiliary: No focal liver abnormality  is seen. Status post cholecystectomy. No biliary dilatation. Pancreas: Unremarkable. No pancreatic ductal dilatation or surrounding inflammatory changes. Spleen: Normal in size without focal abnormality. Adrenals/Urinary Tract: Adrenal glands are unremarkable. Kidneys are normal, without renal calculi, focal lesion, or hydronephrosis. Bladder is unremarkable. Stomach/Bowel: The stomach appears normal. There is no evidence of bowel obstruction. Patient appears to be status post appendectomy. Wall thickening is seen involving the distal transverse, descending and proximal sigmoid colon concerning for  infectious or inflammatory colitis. Vascular/Lymphatic: Aortic atherosclerosis. No enlarged abdominal or pelvic lymph nodes. Reproductive: Status post hysterectomy. No adnexal masses. Other: No abdominal wall hernia or abnormality. No abdominopelvic ascites. Musculoskeletal: No acute or significant osseous findings. IMPRESSION: 1. Wall thickening is seen involving the distal transverse, descending and proximal sigmoid colon concerning for infectious or inflammatory colitis. 2. Aortic atherosclerosis. Aortic Atherosclerosis (ICD10-I70.0). Electronically Signed   By: Marijo Conception M.D.   On: 11/28/2020 09:03     EKG: I have personally reviewed.  Sinus rhythm, QTC 467, early R wave progression, nonspecific T wave change   Assessment/Plan Principal Problem:   Rectal bleeding Active Problems:   Essential hypertension   Collagenous colitis   Depression with anxiety   COPD (chronic obstructive pulmonary disease) (HCC)   Hyperlipemia   Atrial fibrillation (HCC)   Stroke (HCC)   AKI (acute kidney injury) ([place])   Parkinson's disease (HCC)   Rectal bleeding: Hgb 13.6 -->13.2 -->12.5. Dr. Haig Prophet is consulted --> plans to do  EGD/Colonoscopy. CT scan showed colitis.  Per Dr. Haig Prophet, will hold antibiotics since patient does not have fever or leukocytosis. - will admit to med-surg bed as inpt - IVF: 500L NS bolus, then at 75 mL/hr - Start IV pantoprazole 40 mg bid - Zofran IV for nausea - Avoid NSAIDs and SQ heparin - Maintain IV access (2 large bore IVs if possible). - Monitor closely and follow q6h cbc, transfuse as necessary, if Hgb<7.0 - LaB: INR, PTT and type screen -  Hold ASA - check C diff and GI path panel  Essential hypertension -IV hydralazine as needed -On Cardizem, metoprolol  History of Collagenous colitis: -continue Entocort -pt is getting q2week of Xolair injection  Depression with anxiety -continue home meds  COPD (chronic obstructive pulmonary disease) (HCC):  stable -Bronchodilators  Hyperlipemia -Crestor  Atrial fibrillation [facility]): Not taking anticoagulants. -Cardizem and metoprolol  Stroke (HCC) -Crestor  AKI (acute kidney injury) ([place]): Mild.  Creatinine 1.17, BUN 21 -Hold Lasix -IV fluid as above  Parkinson's disease ([place]) -Sinemet         DVT ppx: SCD Code Status: DNR per her husband who is POA Family Communication:  Yes, patient's husband by phone  Disposition Plan:  Anticipate discharge back to previous environment Consults called:  Dr. Haig Prophet of GI Admission status and Level of care: Med-Surg:    as inpt     Status is: Inpatient  Remains inpatient appropriate because:Inpatient level of care appropriate due to severity of illness   Dispo: The patient is from: Home              Anticipated d/c is to: Home              Anticipated d/c date is: 2 days              Patient currently is not medically stable to d/c.   Difficult to place patient No           Date of Service 11/28/2020    [facility]  [facility]   If 7PM-7AM, please contact night-coverage [email or web address] 11/28/2020, 6:19 PM

## 2020-11-28 NOTE — ED Triage Notes (Signed)
Reported via [facility] for rectal bleeding. Began as diarrhea yesterday morning, turned into blood approximately 10 pm last night. No acute distress.

## 2020-11-28 NOTE — Consult Note (Signed)
Consultation  Referring Provider:   Dr. Blaine Hamper Admit date: 11/28/2020 Consult date: 11/28/2020         Reason for Consultation:  Rectal bleeding          HPI:   Caitlin Leonard is a 75 y.o. lady with past medical history of COPD, hypertensions, mitral valve prolapse, and collagenous colitis who presents with history of diarrhea and rectal bleeding. Patient has long history of collagenous colitis and is currently taking budesonide 3 mg daily for it. On chart review when she is not on the budesonide she has issues with diarrhea but when she is on it she has issues with constipation. She follows at [facility]. She has been having more issues with constipation earlier this week but yesterday she had diarrhea which then turned into large volume hematochezia with lower abdominal pain. CT scan in the ED showed colitis in the left colon suspicious for infectious vs inflammatory. She takes three advil PM almost every night but denies any melena. She doesn't take any blood thinners. No family history of any GI malignancies or IBD.  Past Medical History:  Diagnosis Date  . Anemia   . Anxiety   . Arthritis    osteoarthritis  . Asthma    due to seasonal alllergies  . Atrial fibrillation ([place])   . Carpal tunnel syndrome   . Cataract   . Cervicalgia   . Collagenous colitis 2010  . COPD (chronic obstructive pulmonary disease) ([place])   . Depression    situational  . Diabetes mellitus without complication ([place])   . Dysrhythmia   . GERD (gastroesophageal reflux disease)   . GI bleed   . Headache(784.0)    migraines; 2 x month  . Heart murmur    MVP ( symptomatic)  . History of IBS   . Hyperlipemia   . Hypertension    on medication x 10 years  . Migraine   . MVP (mitral valve prolapse)   . Shortness of breath    exertional  . Sinoatrial node dysfunction (HCC)   . Sleep apnea 2007   does not use CPAP since 40 # wt loss  . Stroke ([place])   . Syncopal episodes     Past Surgical History:  Procedure  Laterality Date  . ABDOMINAL HYSTERECTOMY  1979  . BACK SURGERY    . BREAST CYST ASPIRATION Right 25 plus yrs ago  . CARDIAC CATHETERIZATION  2012   [facility]  . CHOLECYSTECTOMY  2011  . COLONOSCOPY WITH PROPOFOL N/A 03/25/2019   Procedure: COLONOSCOPY WITH PROPOFOL;  Surgeon: Lollie Sails, MD;  Location: [facility] ENDOSCOPY;  Service: Endoscopy;  Laterality: N/A;  . ESOPHAGOGASTRODUODENOSCOPY (EGD) WITH PROPOFOL N/A 03/25/2019   Procedure: ESOPHAGOGASTRODUODENOSCOPY (EGD) WITH PROPOFOL;  Surgeon: Lollie Sails, MD;  Location: [facility] ENDOSCOPY;  Service: Endoscopy;  Laterality: N/A;  . FLEXIBLE BRONCHOSCOPY N/A 10/10/2016   Procedure: FLEXIBLE BRONCHOSCOPY;  Surgeon: [name], MD;  Location: [facility] ORS;  Service: Pulmonary;  Laterality: N/A;  . FLEXIBLE BRONCHOSCOPY N/A 12/11/2017   Procedure: FLEXIBLE BRONCHOSCOPY;  Surgeon: [name], MD;  Location: [facility] ORS;  Service: Pulmonary;  Laterality: N/A;  . [place], 2012 x 2   bilateral  . LUMBAR LAMINECTOMY  [id number]  . NASAL SINUS SURGERY  1994  . TONSILLECTOMY    . TOTAL KNEE ARTHROPLASTY  12/22/2011   Procedure: TOTAL KNEE ARTHROPLASTY;  Surgeon: Rudean Haskell, MD;  Location: [facility];  Service: Orthopedics;  Laterality: Left;    Family History  Problem Relation Age of Onset  . Hypertension Mother   . Hyperlipidemia Mother   . Diabetes Mother   . Hypertension Father   . Hyperlipidemia Father   . Arrhythmia Father        A-Fib  . Diabetes Paternal Uncle   . Diabetes Paternal Grandfather   . Breast cancer Maternal Aunt        great in late 93's  . Anesthesia problems Neg Hx   . Colon cancer Neg Hx   . Stomach cancer Neg Hx   . Rectal cancer Neg Hx   . Liver cancer Neg Hx   . Esophageal cancer Neg Hx     Social History   Tobacco Use  . Smoking status: Never Smoker  . Smokeless tobacco: Never Used  Vaping Use  . Vaping Use: Never used  Substance Use Topics  . Alcohol  use: Yes    Alcohol/week: 1.0 standard drink    Types: 1 Glasses of wine per week    Comment: occasional wine - less than once a month  . Drug use: Never    Prior to Admission medications   Medication Sig Start Date End Date Taking? Authorizing Provider  ALPRAZolam Duanne Moron) 0.5 MG tablet Take 0.5 mg by mouth at bedtime.    Yes [provider]  aspirin EC 81 MG tablet Take 81 mg by mouth daily.   Yes [provider]  budesonide (ENTOCORT EC) 3 MG 24 hr capsule Take 3 mg by mouth daily. 03/29/20  Yes [provider]  busPIRone (BUSPAR) 5 MG tablet Take 5 mg by mouth 3 (three) times daily. 09/16/20  Yes [provider]  carbidopa-levodopa (SINEMET IR) 25-100 MG tablet Take 2 tablets by mouth 3 (three) times daily. 10/18/20 10/18/21 Yes [provider]  carboxymethylcellulose (REFRESH PLUS) 0.5 % SOLN Place 1 drop into both eyes 2 (two) times daily.   Yes [provider]  cetirizine (ZYRTEC) 10 MG tablet TAKE 1 TABLET(10 MG) BY MOUTH AT BEDTIME 01/20/20  Yes Tyler Pita, MD  diltiazem (CARDIZEM CD) 180 MG 24 hr capsule Take 180 mg by mouth daily. 10/01/20  Yes [provider]  furosemide (LASIX) 20 MG tablet Take 20 mg by mouth daily. 11/10/20  Yes [provider]  hyoscyamine (LEVSIN) 0.125 MG tablet Take 0.125 mg by mouth every 4 (four) hours as needed.   Yes [provider]  memantine (NAMENDA) 10 MG tablet Take 10 mg by mouth 2 (two) times daily. 10/10/20  Yes [provider]  metoprolol tartrate (LOPRESSOR) 50 MG tablet TAKE 1 TABLET(50 MG) BY MOUTH TWICE DAILY Patient taking differently: Take 50 mg by mouth 2 (two) times daily. 11/24/18  Yes [name], MD  omeprazole (PRILOSEC) 40 MG capsule TAKE 1 CAPSULE BY MOUTH DAILY AT [facility] TIME Patient taking differently: Take 40 mg by mouth daily. 05/30/19  Yes [name], MD  prazosin (MINIPRESS) 1 MG capsule Take 1 mg by mouth at bedtime. 03/11/19  11/28/20 Yes [provider]  rosuvastatin (CRESTOR) 40 MG tablet Take 1 tablet (40 mg total) by mouth daily. 11/11/16 04/30/19 Yes Gollan, Kathlene November, MD  sertraline (ZOLOFT) 100 MG tablet Take 150 mg by mouth daily.    Yes [provider]  albuterol (VENTOLIN HFA) 108 (90 Base) MCG/ACT inhaler Inhale 2 puffs into the lungs every 6 (six) hours as needed for wheezing or shortness of breath. 11/02/19  Tyler Pita, MD  budesonide (ENTOCORT EC) 3 MG 24 hr capsule Take by mouth. 08/19/19   [provider]  budesonide-formoterol (SYMBICORT) 160-4.5 MCG/ACT inhaler Inhale 2 puffs into the lungs 2 (two) times daily for 1 day. 03/23/20 03/24/20  Tyler Pita, MD  EPIPEN 2-PAK 0.3 MG/0.3ML SOAJ injection Inject 0.3 mg into the muscle once.     [provider]  loperamide (IMODIUM) 2 MG capsule Take 1 capsule (2 mg total) by mouth every 6 (six) hours as needed for diarrhea or loose stools. 05/04/19   Hillary Bow, MD  omalizumab Arvid Right) 150 MG injection INJECT 375MG SUBCUTANEOUSLY EVERY 2 WEEKS (GIVEN AT MD  OFFICE, DISCARD UNUSED) 05/04/20   Tyler Pita, MD  ondansetron (ZOFRAN-ODT) 4 MG disintegrating tablet Take 1 tablet (4 mg total) by mouth every 8 (eight) hours as needed for nausea or vomiting. Patient not taking: No sig reported 04/27/19   Merlyn Lot, MD  Vitamin D, Ergocalciferol, (DRISDOL) 1.25 MG (50000 UNIT) CAPS capsule Take 50,000 Units by mouth once a week. 11/02/20   [provider]    Current Facility-Administered Medications  Medication Dose Route Frequency Provider Last Rate Last Admin  . 0.9 %  sodium chloride infusion   Intravenous Continuous Ivor Costa, MD 75 mL/hr at 11/28/20 1050 New Bag at 11/28/20 1050  . acetaminophen (TYLENOL) tablet 650 mg  650 mg Oral Q6H PRN Ivor Costa, MD      . albuterol (VENTOLIN HFA) 108 (90 Base) MCG/ACT inhaler 2 puff  2 puff Inhalation Q4H PRN Ivor Costa, MD      .  dextromethorphan-guaiFENesin ([place] DM) 30-600 MG per 12 hr tablet 1 tablet  1 tablet Oral BID PRN Ivor Costa, MD      . hydrALAZINE (APRESOLINE) injection 5 mg  5 mg Intravenous Q2H PRN Ivor Costa, MD      . ondansetron [facility]) injection 4 mg  4 mg Intravenous Q8H PRN Ivor Costa, MD      . pantoprazole (PROTONIX) injection 40 mg  40 mg Intravenous Huntley Dec, MD   40 mg at 11/28/20 0946  . piperacillin-tazobactam (ZOSYN) IVPB 3.375 g  3.375 g Intravenous Q8H Dorothe Pea, RPH 12.5 mL/hr at 11/28/20 1051 3.375 g at 11/28/20 1051    Allergies as of 11/28/2020 - Review Complete 11/28/2020  Allergen Reaction Noted  . Contrast media [iodinated diagnostic agents] Anaphylaxis 12/05/2011  . Gadolinium derivatives Anaphylaxis 04/06/2014  . Gadoversetamide Anaphylaxis 04/06/2014  . Shellfish allergy Nausea And Vomiting and Other (See Comments) 05/11/2015  . Sulfa antibiotics Hives and Other (See Comments)   . Hydroxychloroquine sulfate Hives and Other (See Comments) 07/04/2010  . Banana Nausea And Vomiting and Other (See Comments) 05/11/2015  . Limonene Other (See Comments) 12/08/2017  . Morphine and related  03/24/2019  . Nabumetone Other (See Comments)   . Otezla [apremilast]  03/24/2019  . Potassium-containing compounds Other (See Comments) 12/08/2017  . Prednisone Nausea And Vomiting 12/15/2017  . Sulfasalazine Hives and Other (See Comments) 07/04/2010  . Sulfonamide derivatives Hives 07/04/2010  . Metronidazole Rash 07/04/2010  . Sulfamethoxazole Rash 03/24/2019     Review of Systems:    All systems reviewed and negative except where noted in HPI.  Review of Systems  Constitutional: Negative for chills and fever.  Respiratory: Positive for shortness of breath.   Cardiovascular: Negative for chest pain.  Gastrointestinal: Positive for abdominal pain, blood in stool, constipation and diarrhea. Negative for heartburn, nausea and vomiting.  Musculoskeletal: Positive  for back  pain and joint pain.  Skin: Negative for itching and rash.  Neurological: Negative for focal weakness.  Psychiatric/Behavioral: Negative for substance abuse.  All other systems reviewed and are negative.     Physical Exam:  Vital signs in last 24 hours: Temp:  [97.7 F (36.5 C)-97.9 F (36.6 C)] 97.9 F (36.6 C) (02/23 1149) Pulse Rate:  [61-73] 67 (02/23 1149) Resp:  [15-28] 18 (02/23 1149) BP: (100-121)/(47-103) 107/52 (02/23 1149) SpO2:  [89 %-97 %] 94 % (02/23 1149) Weight:  [332 kg] 113 kg (02/23 0824)   General:   Pleasant in NAD Head:  Normocephalic and atraumatic. Eyes:   No icterus.   Conjunctiva pink. Neck:  Supple; no masses felt Lungs: No respiratory distress Abdomen:   Flat, soft, nondistended Msk:  MAEW x4, No clubbing or cyanosis. Strength 5/5. Symmetrical without gross deformities. Neurologic:  No focal deficits Skin:  Warm, dry, pink without significant lesions or rashes. Psych:  Alert and cooperative. Normal affect.  LAB RESULTS: Recent Labs    11/28/20 0818  WBC 10.5  HGB 13.2  HCT 40.6  PLT 194   BMET Recent Labs    11/28/20 0818  NA 136  K 3.9  CL 98  CO2 28  GLUCOSE 169*  BUN 21  CREATININE 1.17*  CALCIUM 9.6   LFT Recent Labs    11/28/20 0818  PROT 6.8  ALBUMIN 3.5  AST 27  ALT 14  ALKPHOS 65  BILITOT 0.9   PT/INR Recent Labs    11/28/20 0818  LABPROT 13.1  INR 1.0    STUDIES: CT ABDOMEN PELVIS WO CONTRAST  Result Date: 11/28/2020 CLINICAL DATA:  Acute epigastric abdominal pain.  Rectal bleeding. EXAM: CT ABDOMEN AND PELVIS WITHOUT CONTRAST TECHNIQUE: Multidetector CT imaging of the abdomen and pelvis was performed following the standard protocol without IV contrast. COMPARISON:  April 27, 2019. FINDINGS: Lower chest: No acute abnormality. Hepatobiliary: No focal liver abnormality is seen. Status post cholecystectomy. No biliary dilatation. Pancreas: Unremarkable. No pancreatic ductal dilatation or surrounding  inflammatory changes. Spleen: Normal in size without focal abnormality. Adrenals/Urinary Tract: Adrenal glands are unremarkable. Kidneys are normal, without renal calculi, focal lesion, or hydronephrosis. Bladder is unremarkable. Stomach/Bowel: The stomach appears normal. There is no evidence of bowel obstruction. Patient appears to be status post appendectomy. Wall thickening is seen involving the distal transverse, descending and proximal sigmoid colon concerning for infectious or inflammatory colitis. Vascular/Lymphatic: Aortic atherosclerosis. No enlarged abdominal or pelvic lymph nodes. Reproductive: Status post hysterectomy. No adnexal masses. Other: No abdominal wall hernia or abnormality. No abdominopelvic ascites. Musculoskeletal: No acute or significant osseous findings. IMPRESSION: 1. Wall thickening is seen involving the distal transverse, descending and proximal sigmoid colon concerning for infectious or inflammatory colitis. 2. Aortic atherosclerosis. Aortic Atherosclerosis (ICD10-I70.0). Electronically Signed   By: Marijo Conception M.D.   On: 11/28/2020 09:03       Impression / Plan:   75 y/o lady with past medical history of COPD, hypertensions, mitral valve prolapse, and collagenous colitis who presents with history of diarrhea and rectal bleeding and abnormal imaging showing colitis. Differential includes ischemic, infectious, diverticular, and less likely to be upper GI source or inflammatory but regardless will need EGD/Colonoscopy for further evaluation once infection is ruled out  - once stool samples collected will plan on EGD/Colonoscopy so hopefully tomorrow - can place on clear liquids and NPO after midnight - continue IV protonix BID - maintain active type and screen - daily CBC's -  transfuse if hemoglobin < 7 - supportive care - likely would not start antibiotic unless stool infection is confirmed given hemodynamic stability  Please call with any questions or  concerns  Raylene Miyamoto MD, MPH [place]

## 2020-11-28 NOTE — ED Notes (Signed)
Patient's 02 sat began to drop with movement. Placed on 2L [place]

## 2020-11-28 NOTE — Consult Note (Signed)
Pharmacy Antibiotic Note  Caitlin Leonard is a 75 y.o. female admitted on 11/28/2020 with rectal bleeding/diarrhea. Patient with history significant for IBS-diarrhea and GIB. Pharmacy has been consulted for Zosyn dosing for intra-abdominal infection.   Plan: Zosyn 3.375g IV q8h (4 hour infusion).  Height: 5\' 9"  (175.3 cm) Weight: 113 kg (249 lb 1.9 oz) IBW/kg (Calculated) : 66.2  Temp (24hrs), Avg:97.7 F (36.5 C), Min:97.7 F (36.5 C), Max:97.7 F (36.5 C)  Recent Labs  Lab 11/28/20 0818  WBC 10.5  CREATININE 1.17*    Estimated Creatinine Clearance: 58.3 mL/min (A) (by C-G formula based on SCr of 1.17 mg/dL (H)).    Allergies  Allergen Reactions  . Contrast Media [Iodinated Diagnostic Agents] Anaphylaxis  . Gadolinium Derivatives Anaphylaxis  . Gadoversetamide Anaphylaxis  . Shellfish Allergy Nausea And Vomiting and Other (See Comments)    Can eat shrimp but not oysters Dizziness,severe nausea and vomiting ( can eat shrimp CAN NOT EAT ORYSTERS) Other reaction(s): Other (See Comments) Dizziness,severe nausea and vomiting ( can eat shrimp CAN NOT EAT ORYSTERS)  Can eat shrimp but not oysters Dizziness,severe nausea and vomiting ( can eat shrimp CAN NOT EAT ORYSTERS)  Can eat shrimp but not oysters   . Sulfa Antibiotics Hives and Other (See Comments)    GI upset GI upset GI upset  GI upset GI upset GI upset Other reaction(s): Other (See Comments) GI upset GI upset  GI upset GI upset   . Hydroxychloroquine Sulfate Hives and Other (See Comments)    Severe hives, all over like chicken pox.  . Banana Nausea And Vomiting and Other (See Comments)    Raw bananas  . Limonene Other (See Comments)    GI upset GI upset   . Morphine And Related   . Nabumetone Other (See Comments)    Hypertension  . Otezla [Apremilast]   . Potassium-Containing Compounds Other (See Comments)    GI upset  . Prednisone Nausea And Vomiting  . Sulfasalazine Hives and Other (See  Comments)    GI upset  . Sulfonamide Derivatives Hives  . Metronidazole Rash  . Sulfamethoxazole Rash    Antimicrobials this admission: 2/23 Zosyn >>    Dose adjustments this admission: n/a  Microbiology results: 2/23 BCx: sent/pending 2/23 GI PCR: sent   Thank you for allowing pharmacy to be a part of this patient's care.  Dorothe Pea, PharmD, BCPS Clinical Pharmacist  11/28/2020 9:53 AM

## 2020-11-28 NOTE — ED Provider Notes (Signed)
[facility] Emergency Department Provider Note  ____________________________________________  Time seen: Approximately 9:39 AM  I have reviewed the triage vital signs and the nursing notes.   HISTORY  Chief Complaint Rectal Bleeding    HPI Caitlin Leonard is a 75 y.o. female with a history of COPD atrial fibrillation GERD and previous GI bleed who comes ED complaining of rectal bleeding.  She reports that she has been having diarrhea for the past 2 days which appeared not black and not bloody, but then last night it turned into frank bloody output.  She reports having multiple episodes of bloody output overnight.  Does complain of some upper abdominal pain as well.  Abdominal pain is nonradiating, waxing and waning, no aggravating or alleviating factors.  Patient does note that she has been on prednisone recently and takes ibuprofen nearly every day.   Past Medical History:  Diagnosis Date  . Anemia   . Anxiety   . Arthritis    osteoarthritis  . Asthma    due to seasonal alllergies  . Atrial fibrillation ([place])   . Carpal tunnel syndrome   . Cataract   . Cervicalgia   . Collagenous colitis 2010  . COPD (chronic obstructive pulmonary disease) ([place])   . Depression    situational  . Diabetes mellitus without complication ([place])   . Dysrhythmia   . GERD (gastroesophageal reflux disease)   . GI bleed   . Headache(784.0)    migraines; 2 x month  . Heart murmur    MVP ( symptomatic)  . History of IBS   . Hyperlipemia   . Hypertension    on medication x 10 years  . Migraine   . MVP (mitral valve prolapse)   . Shortness of breath    exertional  . Sinoatrial node dysfunction (HCC)   . Sleep apnea 2007   does not use CPAP since 40 # wt loss  . Stroke ([place])   . Syncopal episodes      Patient Active Problem List   Diagnosis Date Noted  . Acute colitis 11/28/2020  . GI bleeding 11/28/2020  . Diarrhea 04/30/2019  . Septic shock ([place]) 04/30/2019   . Collagenous colitis   . Dehydration 04/07/2019  . Leg swelling 03/19/2016  . Essential hypertension 03/19/2016  . Obesity 09/10/2015  . Bronchitis, acute 04/11/2015  . OSA (obstructive sleep apnea) 10/18/2014  . Nodule of right lung 06/14/2014  . Dyspnea 06/13/2014  . Coronary artery calcification 04/17/2014  . Aneurysm, ascending aorta ([place]) 04/17/2014  . Chronic cough 02/24/2014  . Mold suspected exposure 02/24/2014     Past Surgical History:  Procedure Laterality Date  . ABDOMINAL HYSTERECTOMY  1979  . BACK SURGERY    . BREAST CYST ASPIRATION Right 25 plus yrs ago  . CARDIAC CATHETERIZATION  2012   [facility]  . CHOLECYSTECTOMY  2011  . COLONOSCOPY WITH PROPOFOL N/A 03/25/2019   Procedure: COLONOSCOPY WITH PROPOFOL;  Surgeon: Lollie Sails, MD;  Location: [facility] ENDOSCOPY;  Service: Endoscopy;  Laterality: N/A;  . ESOPHAGOGASTRODUODENOSCOPY (EGD) WITH PROPOFOL N/A 03/25/2019   Procedure: ESOPHAGOGASTRODUODENOSCOPY (EGD) WITH PROPOFOL;  Surgeon: Lollie Sails, MD;  Location: [facility] ENDOSCOPY;  Service: Endoscopy;  Laterality: N/A;  . FLEXIBLE BRONCHOSCOPY N/A 10/10/2016   Procedure: FLEXIBLE BRONCHOSCOPY;  Surgeon: [name], MD;  Location: [facility] ORS;  Service: Pulmonary;  Laterality: N/A;  . FLEXIBLE BRONCHOSCOPY N/A 12/11/2017   Procedure: FLEXIBLE BRONCHOSCOPY;  Surgeon: [name], MD;  Location: [facility] ORS;  Service: Pulmonary;  Laterality: N/A;  . [place], 2012 x 2   bilateral  . LUMBAR LAMINECTOMY  [id number]  . NASAL SINUS SURGERY  1994  . TONSILLECTOMY    . TOTAL KNEE ARTHROPLASTY  12/22/2011   Procedure: TOTAL KNEE ARTHROPLASTY;  Surgeon: Rudean Haskell, MD;  Location: [place];  Service: Orthopedics;  Laterality: Left;     Prior to Admission medications   Medication Sig Start Date End Date Taking? Authorizing Provider  albuterol (VENTOLIN HFA) 108 (90 Base) MCG/ACT inhaler Inhale 2 puffs into the lungs every 6  (six) hours as needed for wheezing or shortness of breath. 11/02/19   Tyler Pita, MD  ALPRAZolam Duanne Moron) 0.5 MG tablet Take 0.5 mg by mouth at bedtime.     [provider]  aspirin EC 81 MG tablet Take 81 mg by mouth daily.    [provider]  budesonide (ENTOCORT EC) 3 MG 24 hr capsule Take by mouth. 08/19/19   [provider]  budesonide-formoterol (SYMBICORT) 160-4.5 MCG/ACT inhaler Inhale 2 puffs into the lungs 2 (two) times daily for 1 day. 03/23/20 03/24/20  Tyler Pita, MD  carboxymethylcellulose (REFRESH PLUS) 0.5 % SOLN Place 1 drop into both eyes 2 (two) times daily.     [provider]  cetirizine (ZYRTEC) 10 MG tablet TAKE 1 TABLET(10 MG) BY MOUTH AT BEDTIME 01/20/20   Tyler Pita, MD  diltiazem (DILACOR XR) 180 MG 24 hr capsule Take 180 mg by mouth daily.    [provider]  EPIPEN 2-PAK 0.3 MG/0.3ML SOAJ injection Inject 0.3 mg into the muscle once.     [provider]  hyoscyamine (LEVSIN) 0.125 MG tablet Take 0.125 mg by mouth every 4 (four) hours as needed.    [provider]  loperamide (IMODIUM) 2 MG capsule Take 1 capsule (2 mg total) by mouth every 6 (six) hours as needed for diarrhea or loose stools. 05/04/19   Hillary Bow, MD  memantine (NAMENDA) 5 MG tablet Take 5 mg by mouth 2 (two) times daily. 03/13/19 03/12/20  [provider]  metoprolol tartrate (LOPRESSOR) 50 MG tablet TAKE 1 TABLET(50 MG) BY MOUTH TWICE DAILY 11/24/18   [name], MD  omalizumab Arvid Right) 150 MG injection INJECT 375MG SUBCUTANEOUSLY EVERY 2 WEEKS (GIVEN AT MD  OFFICE, DISCARD UNUSED) 05/04/20   Tyler Pita, MD  omeprazole (PRILOSEC) 40 MG capsule TAKE 1 CAPSULE BY MOUTH DAILY AT [facility] TIME 05/30/19   [name], MD  ondansetron (ZOFRAN-ODT) 4 MG disintegrating tablet Take 1 tablet (4 mg total) by mouth every 8 (eight) hours as needed for nausea or vomiting. 04/27/19   Merlyn Lot, MD  prazosin  (MINIPRESS) 1 MG capsule Take 1 mg by mouth at bedtime. 03/11/19 03/10/20  [provider]  rosuvastatin (CRESTOR) 40 MG tablet Take 1 tablet (40 mg total) by mouth daily. 11/11/16 04/30/19  Minna Merritts, MD  sertraline (ZOLOFT) 100 MG tablet Take 150 mg by mouth daily.     [provider]     Allergies Contrast media [iodinated diagnostic agents], Gadolinium derivatives, Gadoversetamide, Shellfish allergy, Sulfa antibiotics, Hydroxychloroquine sulfate, Banana, Limonene, Morphine and related, Nabumetone, Otezla [apremilast], Potassium-containing compounds, Prednisone, Sulfasalazine, Sulfonamide derivatives, Metronidazole, and Sulfamethoxazole   Family History  Problem Relation Age of Onset  . Hypertension Mother   . Hyperlipidemia Mother   . Diabetes Mother   . Hypertension Father   . Hyperlipidemia  Father   . Arrhythmia Father        A-Fib  . Diabetes Paternal Uncle   . Diabetes Paternal Grandfather   . Breast cancer Maternal Aunt        great in late 96's  . Anesthesia problems Neg Hx   . Colon cancer Neg Hx   . Stomach cancer Neg Hx   . Rectal cancer Neg Hx   . Liver cancer Neg Hx   . Esophageal cancer Neg Hx     Social History Social History   Tobacco Use  . Smoking status: Never Smoker  . Smokeless tobacco: Never Used  Vaping Use  . Vaping Use: Never used  Substance Use Topics  . Alcohol use: Yes    Alcohol/week: 1.0 standard drink    Types: 1 Glasses of wine per week    Comment: occasional wine - less than once a month  . Drug use: Never    Review of Systems  Constitutional:   No fever or chills.  ENT:   No sore throat. No rhinorrhea. Cardiovascular:   No chest pain or syncope. Respiratory:   No dyspnea or cough. Gastrointestinal: Positive as above for abdominal pain and rectal bleeding Musculoskeletal:   Negative for focal pain or swelling All other systems reviewed and are negative except as documented above in ROS and  HPI.  ____________________________________________   PHYSICAL EXAM:  VITAL SIGNS: ED Triage Vitals  Enc Vitals Group     BP 11/28/20 0823 (!) 106/91     Pulse Rate 11/28/20 0823 73     Resp 11/28/20 0823 15     Temp 11/28/20 0823 97.7 F (36.5 C)     Temp Source 11/28/20 0823 Oral     SpO2 11/28/20 0823 93 %     Weight 11/28/20 0824 249 lb 1.9 oz (113 kg)     Height 11/28/20 0824 _0  (1.753 m)     Head Circumference --      Peak Flow --      Pain Score 11/28/20 0823 0     Pain Loc --      Pain Edu? --      Excl. in [place]? --     Vital signs reviewed, nursing assessments reviewed.   Constitutional:   Alert and oriented. Non-toxic appearance. Eyes:   Conjunctivae are normal. EOMI. PERRL. ENT      Head:   Normocephalic and atraumatic.      Nose:   Wearing a mask.      Mouth/Throat:   Wearing a mask.      Neck:   No meningismus. Full ROM. Hematological/Lymphatic/Immunilogical:   No cervical lymphadenopathy. Cardiovascular:   RRR. Symmetric bilateral radial and DP pulses.  No murmurs. Cap refill less than 2 seconds. Respiratory:   Normal respiratory effort without tachypnea/retractions. Breath sounds are clear and equal bilaterally. No wheezes/rales/rhonchi. Gastrointestinal:   Soft with epigastric and left-sided abdominal tenderness. Non distended. There is no CVA tenderness.  No rebound, rigidity, or guarding.  Rectal exam performed with nurse at bedside, dark brown stool, Hemoccult positive  Musculoskeletal:   Normal range of motion in all extremities. No joint effusions.  No lower extremity tenderness.  No edema. Neurologic:   Normal speech and language.  Motor grossly intact. No acute focal neurologic deficits are appreciated.  Skin:    Skin is warm, dry and intact. No rash noted.  No petechiae, purpura, or bullae.  ____________________________________________    LABS (pertinent positives/negatives) (all labs ordered  are listed, but only abnormal results are  displayed) Labs Reviewed  COMPREHENSIVE METABOLIC PANEL - Abnormal; Notable for the following components:      Result Value   Glucose, Bld 169 (*)    Creatinine, Ser 1.17 (*)    GFR, Estimated 50 (*)    All other components within normal limits  CBC WITH DIFFERENTIAL/PLATELET - Abnormal; Notable for the following components:   Neutro Abs 9.0 (*)    All other components within normal limits  RESP PANEL BY RT-PCR (FLU A&B, COVID) ARPGX2  LIPASE, BLOOD  PROTIME-INR  SAMPLE TO BLOOD BANK   ____________________________________________   EKG  Interpreted by me Sinus rhythm rate of 75, normal axis and intervals.  Normal QRS ST segments and T waves.  ____________________________________________    RADIOLOGY  CT ABDOMEN PELVIS WO CONTRAST  Result Date: 11/28/2020 CLINICAL DATA:  Acute epigastric abdominal pain.  Rectal bleeding. EXAM: CT ABDOMEN AND PELVIS WITHOUT CONTRAST TECHNIQUE: Multidetector CT imaging of the abdomen and pelvis was performed following the standard protocol without IV contrast. COMPARISON:  April 27, 2019. FINDINGS: Lower chest: No acute abnormality. Hepatobiliary: No focal liver abnormality is seen. Status post cholecystectomy. No biliary dilatation. Pancreas: Unremarkable. No pancreatic ductal dilatation or surrounding inflammatory changes. Spleen: Normal in size without focal abnormality. Adrenals/Urinary Tract: Adrenal glands are unremarkable. Kidneys are normal, without renal calculi, focal lesion, or hydronephrosis. Bladder is unremarkable. Stomach/Bowel: The stomach appears normal. There is no evidence of bowel obstruction. Patient appears to be status post appendectomy. Wall thickening is seen involving the distal transverse, descending and proximal sigmoid colon concerning for infectious or inflammatory colitis. Vascular/Lymphatic: Aortic atherosclerosis. No enlarged abdominal or pelvic lymph nodes. Reproductive: Status post hysterectomy. No adnexal masses. Other:  No abdominal wall hernia or abnormality. No abdominopelvic ascites. Musculoskeletal: No acute or significant osseous findings. IMPRESSION: 1. Wall thickening is seen involving the distal transverse, descending and proximal sigmoid colon concerning for infectious or inflammatory colitis. 2. Aortic atherosclerosis. Aortic Atherosclerosis (ICD10-I70.0). Electronically Signed   By: Marijo Conception M.D.   On: 11/28/2020 09:03    ____________________________________________   PROCEDURES Procedures  ____________________________________________  DIFFERENTIAL DIAGNOSIS   Upper GI bleed, colitis, diverticulitis, pancreatitis  CLINICAL IMPRESSION / ASSESSMENT AND PLAN / ED COURSE  Medications ordered in the ED: Medications  0.9 %  sodium chloride infusion (has no administration in time range)  sodium chloride 0.9 % bolus 500 mL (0 mLs Intravenous Stopped 11/28/20 0919)    Pertinent labs & imaging results that were available during my care of the patient were reviewed by me and considered in my medical decision making (see chart for details).  Caitlin Leonard was evaluated in Emergency Department on 11/28/2020 for the symptoms described in the history of present illness. She was evaluated in the context of the global COVID-19 pandemic, which necessitated consideration that the patient might be at risk for infection with the SARS-CoV-2 virus that causes COVID-19. Institutional protocols and algorithms that pertain to the evaluation of patients at risk for COVID-19 are in a state of rapid change based on information released by regulatory bodies including the CDC and federal and state organizations. These policies and algorithms were followed during the patient's care in the ED.     Clinical Course as of 11/28/20 0939  Wed Nov 28, 2020  0928 Patient presents with bloody stool, lightheadedness with standing and fatigue. Vital signs unremarkable. She is Hemoccult positive. Not on blood thinners. I  suspect this is related to steroid  and NSAID use. Will give Protonix, IV fluids. Lab panel unremarkable. We will plan to hospitalize for further evaluation. [PS]    Clinical Course User Index [PS] Carrie Mew, MD    ----------------------------------------- 10:57 AM on 11/28/2020 -----------------------------------------  CT shows colitis.  Vital signs stable, hemoglobin stable.  Antibiotics ordered by hospitalist.   ____________________________________________   FINAL CLINICAL IMPRESSION(S) / ED DIAGNOSES    Final diagnoses:  Upper GI bleed     ED Discharge Orders    None      Portions of this note were generated with dragon dictation software. Dictation errors may occur despite best attempts at proofreading.   Carrie Mew, MD 11/28/20 1057

## 2020-11-28 NOTE — ED Notes (Signed)
Tech to transport patient to floor.

## 2020-11-29 ENCOUNTER — Encounter
Admission: EM | Disposition: A | Discharge status: Home / Self Care | Payer: Self-pay | Source: Home / Self Care | Attending: Internal Medicine

## 2020-11-29 ENCOUNTER — Inpatient Hospital Stay: Payer: Medicare PPO | Admitting: Anesthesiology

## 2020-11-29 ENCOUNTER — Encounter: Payer: Self-pay | Admitting: Internal Medicine

## 2020-11-29 ENCOUNTER — Other Ambulatory Visit: Payer: Self-pay

## 2020-11-29 DIAGNOSIS — K625 Hemorrhage of anus and rectum: Secondary | ICD-10-CM | POA: Diagnosis not present

## 2020-11-29 HISTORY — PX: ESOPHAGOGASTRODUODENOSCOPY (EGD) WITH PROPOFOL: SHX5813

## 2020-11-29 HISTORY — PX: COLONOSCOPY WITH PROPOFOL: SHX5780

## 2020-11-29 LAB — BASIC METABOLIC PANEL
Anion gap: 7 (ref 5–15)
BUN: 22 mg/dL (ref 8–23)
CO2: 25 mmol/L (ref 22–32)
Calcium: 8.7 mg/dL — ABNORMAL LOW (ref 8.9–10.3)
Chloride: 102 mmol/L (ref 98–111)
Creatinine, Ser: 1.02 mg/dL — ABNORMAL HIGH (ref 0.44–1.00)
GFR, Estimated: 58 mL/min — ABNORMAL LOW (ref >60)
Glucose, Bld: 119 mg/dL — ABNORMAL HIGH (ref 70–99)
Potassium: 3 mmol/L — ABNORMAL LOW (ref 3.5–5.1)
Sodium: 134 mmol/L — ABNORMAL LOW (ref 135–145)

## 2020-11-29 LAB — CALPROTECTIN, FECAL: Calprotectin, Fecal: 371 ug/g — ABNORMAL HIGH (ref 0–120)

## 2020-11-29 LAB — CBC
HCT: 32.7 % — ABNORMAL LOW (ref 36.0–46.0)
Hemoglobin: 10.8 g/dL — ABNORMAL LOW (ref 12.0–15.0)
MCH: 31.5 pg (ref 26.0–34.0)
MCHC: 33 g/dL (ref 30.0–36.0)
MCV: 95.3 fL (ref 80.0–100.0)
Platelets: 128 10*3/uL — ABNORMAL LOW (ref 150–400)
RBC: 3.43 MIL/uL — ABNORMAL LOW (ref 3.87–5.11)
RDW: 14.9 % (ref 11.5–15.5)
WBC: 10.2 10*3/uL (ref 4.0–10.5)
nRBC: 0 % (ref 0.0–0.2)

## 2020-11-29 LAB — GLUCOSE, CAPILLARY: Glucose-Capillary: 111 mg/dL — ABNORMAL HIGH (ref 70–99)

## 2020-11-29 SURGERY — ESOPHAGOGASTRODUODENOSCOPY (EGD) WITH PROPOFOL
Anesthesia: General

## 2020-11-29 MED ORDER — PROPOFOL 10 MG/ML IV BOLUS
INTRAVENOUS | Status: DC | PRN
Start: 2020-11-29 — End: 2020-11-29
  Administered 2020-11-29: 40 mg via INTRAVENOUS

## 2020-11-29 MED ORDER — PHENYLEPHRINE HCL (PRESSORS) 10 MG/ML IV SOLN
INTRAVENOUS | Status: DC | PRN
  Administered 2020-11-29 (×2): 100 ug via INTRAVENOUS
  Administered 2020-11-29: 200 ug via INTRAVENOUS
  Administered 2020-11-29: 100 ug via INTRAVENOUS

## 2020-11-29 MED ORDER — LIDOCAINE HCL (CARDIAC) PF 100 MG/5ML IV SOSY
PREFILLED_SYRINGE | INTRAVENOUS | Status: DC | PRN
  Administered 2020-11-29: 50 mg via INTRAVENOUS

## 2020-11-29 MED ORDER — PROPOFOL 10 MG/ML IV BOLUS
INTRAVENOUS | Status: AC
  Filled 2020-11-29: qty 20

## 2020-11-29 MED ORDER — POTASSIUM CHLORIDE 10 MEQ/100ML IV SOLN
10.0000 meq | INTRAVENOUS | Status: AC
  Administered 2020-11-29 (×6): 10 meq via INTRAVENOUS
  Filled 2020-11-29 (×5): qty 100

## 2020-11-29 MED ORDER — PROCHLORPERAZINE EDISYLATE 10 MG/2ML IJ SOLN
5.0000 mg | Freq: Once | INTRAMUSCULAR | Status: AC
  Administered 2020-11-29: 5 mg via INTRAVENOUS
  Filled 2020-11-29: qty 2

## 2020-11-29 MED ORDER — ONDANSETRON HCL 4 MG/2ML IJ SOLN: 4.0000 mg | Freq: Four times a day (QID) | INTRAMUSCULAR | Status: DC | PRN

## 2020-11-29 MED ORDER — PROPOFOL 500 MG/50ML IV EMUL
INTRAVENOUS | Status: DC | PRN
  Administered 2020-11-29: 75 ug/kg/min via INTRAVENOUS

## 2020-11-29 NOTE — Anesthesia Postprocedure Evaluation (Signed)
Anesthesia Post Note  Patient: Caitlin Leonard  Procedure(s) Performed: ESOPHAGOGASTRODUODENOSCOPY (EGD) WITH PROPOFOL (N/A ) COLONOSCOPY WITH PROPOFOL (N/A )  Patient location during evaluation: Phase II Anesthesia Type: General Level of consciousness: awake and alert, awake and oriented Pain management: pain level controlled Vital Signs Assessment: post-procedure vital signs reviewed and stable Respiratory status: spontaneous breathing, nonlabored ventilation and respiratory function stable Cardiovascular status: blood pressure returned to baseline and stable Postop Assessment: no apparent nausea or vomiting Anesthetic complications: no   No complications documented.   Last Vitals:  Vitals:   11/29/20 1343 11/29/20 1451  BP: (!) 138/58 (!) 118/50  Pulse: 79   Resp: 19   Temp: (!) 36.1 C 36.4 C  SpO2: 97%     Last Pain:  Vitals:   11/29/20 1451  TempSrc: Temporal  PainSc:                  Phill Mutter

## 2020-11-29 NOTE — Anesthesia Preprocedure Evaluation (Addendum)
Anesthesia Evaluation   Patient awake    Reviewed: Allergy & Precautions, H&P , NPO status , reviewed documented beta blocker date and time   Airway Mallampati: II  TM Distance: >3 FB Neck ROM: Full    Dental no notable dental hx.    Pulmonary shortness of breath, asthma , sleep apnea , COPD,    Pulmonary exam normal        Cardiovascular hypertension, + CAD  Normal cardiovascular exam+ dysrhythmias + Valvular Problems/Murmurs   04/2019 ECHO IMPRESSIONS    1. The left ventricle has normal systolic function, with an ejection  fraction of 55-60%. The cavity size was normal. Left ventricular diastolic  Doppler parameters are consistent with impaired relaxation.  2. The right ventricle has normal systolic function. The cavity was  normal. There is no increase in right ventricular wall thickness. Normal  RVSP  3. There is dilatation of the ascending aorta. Technician did not  measure. Known to be dilated 4 to 4.2 cm on CT scan, no change compared to  studies dating back to 2016.    Neuro/Psych  Headaches, PSYCHIATRIC DISORDERS Anxiety Depression  Neuromuscular disease CVA    GI/Hepatic GERD  ,  Endo/Other  diabetes  Renal/GU Renal disease     Musculoskeletal  (+) Arthritis ,   Abdominal   Peds  Hematology  (+) Blood dyscrasia, anemia ,   Anesthesia Other Findings Anemia Anxiety Arthritis osteoarthritis Asthma     Comment:  due to seasonal alllergies Atrial fibrillation (HCC) Carpal tunnel syndrome Cataract Cervicalgia 2010: Collagenous colitis COPD Depression     Comment:  situational Diabetes mellitus without complication  Dysrhythmia GERD GI bleed Headache(784.0)     Comment:  migraines; 2 x month Heart murmur     Comment:  MVP ( symptomatic) History of IBS Hyperlipemia Hypertension     Comment:  on medication x 10 years Migraine MVP (mitral valve prolapse) Shortness of breath      Comment:  exertional Cinoatrial node dysfunction ([place]) 2007: Sleep apnea     Comment:  does not use CPAP since 40 # wt loss Stroke [facility]) Syncopal episodes  Past Surgical History: 1979: ABDOMINAL HYSTERECTOMY No date: BACK SURGERY 25 plus yrs ago: BREAST CYST ASPIRATION; Right 2012: CARDIAC CATHETERIZATION     Comment:  [place] 2011: CHOLECYSTECTOMY 03/25/2019: COLONOSCOPY WITH PROPOFOL; N/A     Comment:  Procedure: COLONOSCOPY WITH PROPOFOL;  Surgeon:               Lollie Sails, MD;  Location: [facility] ENDOSCOPY;                Service: Endoscopy;  Laterality: N/A; 03/25/2019: ESOPHAGOGASTRODUODENOSCOPY (EGD) WITH PROPOFOL; N/A     Comment:  Procedure: ESOPHAGOGASTRODUODENOSCOPY (EGD) WITH               PROPOFOL;  Surgeon: Lollie Sails, MD;  Location:               [facility] ENDOSCOPY;  Service: Endoscopy;  Laterality: N/A; 10/10/2016: FLEXIBLE BRONCHOSCOPY; N/A     Comment:  Procedure: FLEXIBLE BRONCHOSCOPY;  Surgeon: [name], MD;  Location: [facility] ORS;  Service: Pulmonary;                Laterality: N/A; 12/11/2017: FLEXIBLE BRONCHOSCOPY; N/A     Comment:  Procedure: FLEXIBLE BRONCHOSCOPY;  Surgeon: Alva Garnet,  Zella Richer, MD;  Location: [facility] ORS;  Service: Pulmonary;                Laterality: N/A; No date: [place], 2012 x 2: KNEE ARTHROSCOPY     Comment:  bilateral [id number]: LUMBAR LAMINECTOMY 1994: NASAL SINUS SURGERY No date: TONSILLECTOMY 12/22/2011: TOTAL KNEE ARTHROPLASTY     Comment:  Procedure: TOTAL KNEE ARTHROPLASTY;  Surgeon: Rudean Haskell, MD;  Location: [place];  Service: Orthopedics;                Laterality: Left;  BMI    Body Mass Index: 36.79 kg/m      Reproductive/Obstetrics                          Anesthesia Physical Anesthesia Plan  ASA: III  Anesthesia Plan: General   Post-op Pain Management:    Induction: Intravenous  PONV Risk Score and Plan: 2 and  Treatment may vary due to age or medical condition, TIVA and Propofol infusion  Airway Management Planned: Nasal Cannula and Natural Airway  Additional Equipment:   Intra-op Plan:   Post-operative Plan:   Informed Consent: I have reviewed the patients History and Physical, chart, labs and discussed the procedure including the risks, benefits and alternatives for the proposed anesthesia with the patient or authorized representative who has indicated his/her understanding and acceptance.     Dental Advisory Given  Plan Discussed with: CRNA, Anesthesiologist and Surgeon  Anesthesia Plan Comments:        Anesthesia Quick Evaluation

## 2020-11-29 NOTE — Care Plan (Signed)
EGD/Colon performed. Colon with severe inflammation/colitis in area described in CT scan. Biopsied. Normal mucosa from rectum to mid sigmoid and normal mucosa from mid transverse to TI. Unusual distribution. Will need to follow up biopsies. Monitor hemoglobins daily. Will see again tomorrow to see how doing and come up with dispo plan.  Raylene Miyamoto MD, MPH [place]

## 2020-11-29 NOTE — Transfer of Care (Signed)
Immediate Anesthesia Transfer of Care Note  Patient: Caitlin Leonard  Procedure(s) Performed: ESOPHAGOGASTRODUODENOSCOPY (EGD) WITH PROPOFOL (N/A ) COLONOSCOPY WITH PROPOFOL (N/A )  Patient Location: PACU and Endoscopy Unit  Anesthesia Type:General  Level of Consciousness: awake, drowsy and patient cooperative  Airway & Oxygen Therapy: Patient Spontanous Breathing  Post-op Assessment: Report given to RN and Post -op Vital signs reviewed and stable  Post vital signs: Reviewed and stable  Last Vitals:  Vitals Value Taken Time  BP 118/50 11/29/20 1452  Temp 36.4 C 11/29/20 1451  Pulse 77 11/29/20 1452  Resp 29 11/29/20 1452  SpO2 93 % 11/29/20 1452  Vitals shown include unvalidated device data.  Last Pain:  Vitals:   11/29/20 1451  TempSrc: Temporal  PainSc:       Patients Stated Pain Goal: 2 (79/49/97 1820)  Complications: No complications documented.

## 2020-11-29 NOTE — Op Note (Signed)
[facility] Gastroenterology Patient Name: Caitlin Leonard Procedure Date: 11/29/2020 2:08 PM MRN: [id number] Account #: [id number] Date of Birth: [birth year] Admit Type: Inpatient Age: 75 Room: [facility] ENDO ROOM 3 Gender: Female Note Status: Finalized Procedure:             Upper GI endoscopy Indications:           Abnormal CT of the GI tract Providers:             Andrey Farmer MD, MD Medicines:             Monitored Anesthesia Care Complications:         No immediate complications. Estimated blood loss:                         Minimal. Procedure:             Pre-Anesthesia Assessment:                        - Prior to the procedure, a History and Physical was                         performed, and patient medications and allergies were                         reviewed. The patient is competent. The risks and                         benefits of the procedure and the sedation options and                         risks were discussed with the patient. All questions                         were answered and informed consent was obtained.                         Patient identification and proposed procedure were                         verified by the physician, the nurse, the anesthetist                         and the technician in the endoscopy suite. Mental                         Status Examination: alert and oriented. Airway                         Examination: normal oropharyngeal airway and neck                         mobility. Respiratory Examination: clear to                         auscultation. CV Examination: normal. Prophylactic                         Antibiotics: The patient does not require prophylactic  antibiotics. Prior Anticoagulants: The patient has                         taken no previous anticoagulant or antiplatelet                         agents. ASA Grade Assessment: III - A patient with                         severe  systemic disease. After reviewing the risks and                         benefits, the patient was deemed in satisfactory                         condition to undergo the procedure. The anesthesia                         plan was to use monitored anesthesia care (MAC).                         Immediately prior to administration of medications,                         the patient was re-assessed for adequacy to receive                         sedatives. The heart rate, respiratory rate, oxygen                         saturations, blood pressure, adequacy of pulmonary                         ventilation, and response to care were monitored                         throughout the procedure. The physical status of the                         patient was re-assessed after the procedure.                        After obtaining informed consent, the endoscope was                         passed under direct vision. Throughout the procedure,                         the patient's blood pressure, pulse, and oxygen                         saturations were monitored continuously. The Endoscope                         was introduced through the mouth, and advanced to the                         second part of duodenum. The upper GI endoscopy was  accomplished without difficulty. The patient tolerated                         the procedure well. Findings:      A small hiatal hernia was present.      Diffuse moderate inflammation characterized by erythema was found in the       gastric antrum. Biopsies were taken with a cold forceps for Helicobacter       pylori testing. Estimated blood loss was minimal.      The examined duodenum was normal. Impression:            - Small hiatal hernia.                        - Gastritis. Biopsied.                        - Normal examined duodenum. Recommendation:        - Discharge patient to home.                        - Resume previous diet.                         - Continue present medications.                        - Await pathology results.                        - Perform a colonoscopy today. Procedure Code(s):     --- Professional ---                        [phone], Esophagogastroduodenoscopy, flexible,                         transoral; with biopsy, single or multiple Diagnosis Code(s):     --- Professional ---                        K44.9, Diaphragmatic hernia without obstruction or                         gangrene                        K29.70, Gastritis, unspecified, without bleeding                        R93.3, Abnormal findings on diagnostic imaging of                         other parts of digestive tract CPT copyright 2019 American Medical Association. All rights reserved. The codes documented in this report are preliminary and upon coder review may  be revised to meet current compliance requirements. Andrey Farmer MD, MD 11/29/2020 2:50:47 PM Number of Addenda: 0 Note Initiated On: 11/29/2020 2:08 PM Estimated Blood Loss:  Estimated blood loss was minimal.      [facility]

## 2020-11-29 NOTE — Progress Notes (Signed)
[facility] NAME: Caitlin Leonard    MR#:  [id number]  DATE OF BIRTH:  19-Nov-1945  SUBJECTIVE:   Patient has history of colitis. Came in with significant diarrhea and rectal bleed. Currently working on her prep. Follows with Caitlin Leonard.I. REVIEW OF SYSTEMS:   Review of Systems  Constitutional: Negative for chills, fever and weight loss.  HENT: Negative for ear discharge, ear pain and nosebleeds.   Eyes: Negative for blurred vision, pain and discharge.  Respiratory: Negative for sputum production, shortness of breath, wheezing and stridor.   Cardiovascular: Negative for chest pain, palpitations, orthopnea and PND.  Gastrointestinal: Positive for blood in stool and diarrhea. Negative for abdominal pain, nausea and vomiting.  Genitourinary: Negative for frequency and urgency.  Musculoskeletal: Negative for back pain and joint pain.  Neurological: Positive for weakness. Negative for sensory change, speech change and focal weakness.  Psychiatric/Behavioral: Negative for depression and hallucinations. The patient is not nervous/anxious.    Tolerating Diet:npo Tolerating PT:   DRUG ALLERGIES:   Allergies  Allergen Reactions  . Contrast Media [Iodinated Diagnostic Agents] Anaphylaxis  . Gadolinium Derivatives Anaphylaxis  . Gadoversetamide Anaphylaxis  . Shellfish Allergy Nausea And Vomiting and Other (See Comments)    Can eat shrimp but not oysters Dizziness,severe nausea and vomiting ( can eat shrimp CAN NOT EAT ORYSTERS) Other reaction(s): Other (See Comments) Dizziness,severe nausea and vomiting ( can eat shrimp CAN NOT EAT ORYSTERS)  Can eat shrimp but not oysters Dizziness,severe nausea and vomiting ( can eat shrimp CAN NOT EAT ORYSTERS)  Can eat shrimp but not oysters   . Sulfa Antibiotics Hives and Other (See Comments)    GI upset GI upset GI upset  GI upset GI upset GI upset Other reaction(s): Other (See Comments) GI  upset GI upset  GI upset GI upset   . Hydroxychloroquine Sulfate Hives and Other (See Comments)    Severe hives, all over like chicken pox.  . Banana Nausea And Vomiting and Other (See Comments)    Raw bananas  . Limonene Other (See Comments)    GI upset GI upset   . Morphine And Related   . Nabumetone Other (See Comments)    Hypertension  . Otezla [Apremilast]   . Potassium-Containing Compounds Other (See Comments)    GI upset  . Prednisone Nausea And Vomiting  . Sulfasalazine Hives and Other (See Comments)    GI upset  . Sulfonamide Derivatives Hives  . Metronidazole Rash  . Sulfamethoxazole Rash    VITALS:  Blood pressure (!) 113/58, pulse 72, temperature 97.8 F (36.6 C), temperature source Oral, resp. rate 18, height 5\' 9"  (1.753 m), weight 113 kg, SpO2 97 %.  PHYSICAL EXAMINATION:   Physical Exam  GENERAL:  75 y.o.-year-old patient lying in the bed with no acute distress. obese LUNGS: Normal breath sounds bilaterally, no wheezing, rales, rhonchi. No use of accessory muscles of respiration.  CARDIOVASCULAR: S1, S2 normal. No murmurs, rubs, or gallops.  ABDOMEN: Soft, nontender, nondistended. Bowel sounds present. EXTREMITIES: No cyanosis, clubbing or edema b/l.    NEUROLOGIC: Cranial nerves II through XII are intact. No focal Motor or sensory deficits b/l.   PSYCHIATRIC:  patient is alert and oriented x 3.  SKIN: No obvious rash, lesion, or ulcer.   LABORATORY PANEL:  CBC Recent Labs  Lab 11/29/20 0358  WBC 10.2  HGB 10.8*  HCT 32.7*  PLT 128*    Chemistries  Recent Labs  Lab 11/28/20 0818 11/29/20 0358  NA 136 134*  K 3.9 3.0*  CL 98 102  CO2 28 25  GLUCOSE 169* 119*  BUN 21 22  CREATININE 1.17* 1.02*  CALCIUM 9.6 8.7*  AST 27  --   ALT 14  --   ALKPHOS 65  --   BILITOT 0.9  --    Cardiac Enzymes No results for input(s): TROPONINI in the last 168 hours. RADIOLOGY:  CT ABDOMEN PELVIS WO CONTRAST  Result Date: 11/28/2020 CLINICAL  DATA:  Acute epigastric abdominal pain.  Rectal bleeding. EXAM: CT ABDOMEN AND PELVIS WITHOUT CONTRAST TECHNIQUE: Multidetector CT imaging of the abdomen and pelvis was performed following the standard protocol without IV contrast. COMPARISON:  April 27, 2019. FINDINGS: Lower chest: No acute abnormality. Hepatobiliary: No focal liver abnormality is seen. Status post cholecystectomy. No biliary dilatation. Pancreas: Unremarkable. No pancreatic ductal dilatation or surrounding inflammatory changes. Spleen: Normal in size without focal abnormality. Adrenals/Urinary Tract: Adrenal glands are unremarkable. Kidneys are normal, without renal calculi, focal lesion, or hydronephrosis. Bladder is unremarkable. Stomach/Bowel: The stomach appears normal. There is no evidence of bowel obstruction. Patient appears to be status post appendectomy. Wall thickening is seen involving the distal transverse, descending and proximal sigmoid colon concerning for infectious or inflammatory colitis. Vascular/Lymphatic: Aortic atherosclerosis. No enlarged abdominal or pelvic lymph nodes. Reproductive: Status post hysterectomy. No adnexal masses. Other: No abdominal wall hernia or abnormality. No abdominopelvic ascites. Musculoskeletal: No acute or significant osseous findings. IMPRESSION: 1. Wall thickening is seen involving the distal transverse, descending and proximal sigmoid colon concerning for infectious or inflammatory colitis. 2. Aortic atherosclerosis. Aortic Atherosclerosis (ICD10-I70.0). Electronically Signed   By: Caitlin Conception M.D.   On: 11/28/2020 09:03   ASSESSMENT AND PLAN:   Caitlin Leonard is a 75 y.o. female with medical history significant of IBS, GIB, HTN, HLD, DM, COPD, stroke, A fib not on AC, OSA, depression with anxiety, collagenous colitis, Parkinson's disease, presents with rectal bleeding and diarrhea.  Pt states that she had constipation earlier this week, but since yesterday she started having diarrhea,  which then turned into large volume bloody diarrhea.  Rectal bleeding: Hgb 13.6 -->13.2 -->12.5--10.8 -- Dr. Haig Prophet is consulted --> plans to do  EGD/Colonoscopy. -- CT scan showed colitis.  Per Dr. Haig Prophet, will hold antibiotics since patient does not have fever or leukocytosis. --stool studies negative -- Zofran IV for nausea - Avoid NSAIDs and SQ heparin -  Hold ASA  Essential hypertension -IV hydralazine as needed -On Cardizem, metoprolol  History of Collagenous colitis: -continue Entocort -pt is getting q2week of Xolair injection--pt follows [facility]  Depression with anxiety -continue home meds  COPD (chronic obstructive pulmonary disease) ([place]): stable -Bronchodilators -wean to RA--no resp distress  Hyperlipemia -Crestor  Atrial fibrillation [facility]): Not taking anticoagulants. -Cardizem and metoprolol  Stroke (H[facility]) -Crestor  AKI (acute kidney injury) ([place]): Mild.  Creatinine 1.17, BUN 21 Hypokalemia -Hold Lasix, replace K --IV fluid as above --creat down to 1.02  Parkinson's disease ([place]) -Sinemet         DVT ppx: SCD Code Status: DNR per her husband who is POA Family Communication:  Yes, patient's husband by phone Disposition Plan:  Anticipate discharge back to previous environment Consults called:  Dr. Haig Prophet of GI Admission status and Level of care: Med-Surg: inpatient   Status is: Inpatient  Remains inpatient appropriate because: Inpatient level of care appropriate due to severity of illness   Dispo: The patient is from: Home  Anticipated d/c is to: Home  Anticipated d/c date is: 2 days  Patient currently is not medically stable to d/c.              Difficult to place patient No patient admitted with rectal bleed. Pending colonoscopy   TOTAL TIME TAKING CARE OF THIS PATIENT: *30* minutes.  >50% time spent on counselling and coordination of care  Note: This dictation was  prepared with Dragon dictation along with smaller phrase technology. Any transcriptional errors that result from this process are unintentional.  Fritzi Mandes M.D    [facility]   CC: Primary care physician; [place] Pink, MDPatient ID: Caitlin Leonard, female   DOB: 1946-01-26, 75 y.o.   MRN: [id number]

## 2020-11-29 NOTE — Op Note (Signed)
[facility] Gastroenterology Patient Name: Caitlin Leonard Procedure Date: 11/29/2020 2:07 PM MRN: [id number] Account #: [id number] Date of Birth: 20-Jul-1946 Admit Type: Inpatient Age: 75 Room: [facility] ENDO ROOM 3 Gender: Female Note Status: Finalized Procedure:             Colonoscopy Indications:           Abnormal CT of the GI tract Providers:             Andrey Farmer MD, MD Referring MD:          Irven Easterly. Kary Kos, MD (Referring MD) Medicines:             Monitored Anesthesia Care Complications:         No immediate complications. Estimated blood loss:                         Minimal. Procedure:             Pre-Anesthesia Assessment:                        - Prior to the procedure, a History and Physical was                         performed, and patient medications and allergies were                         reviewed. The patient is competent. The risks and                         benefits of the procedure and the sedation options and                         risks were discussed with the patient. All questions                         were answered and informed consent was obtained.                         Patient identification and proposed procedure were                         verified by the physician, the nurse, the anesthetist                         and the technician in the endoscopy suite. Mental                         Status Examination: alert and oriented. Airway                         Examination: normal oropharyngeal airway and neck                         mobility. Respiratory Examination: clear to                         auscultation. CV Examination: normal. Prophylactic  Antibiotics: The patient does not require prophylactic                         antibiotics. Prior Anticoagulants: The patient has                         taken no previous anticoagulant or antiplatelet                         agents. ASA Grade Assessment:  III - A patient with                         severe systemic disease. After reviewing the risks and                         benefits, the patient was deemed in satisfactory                         condition to undergo the procedure. The anesthesia                         plan was to use monitored anesthesia care (MAC).                         Immediately prior to administration of medications,                         the patient was re-assessed for adequacy to receive                         sedatives. The heart rate, respiratory rate, oxygen                         saturations, blood pressure, adequacy of pulmonary                         ventilation, and response to care were monitored                         throughout the procedure. The physical status of the                         patient was re-assessed after the procedure.                        After obtaining informed consent, the colonoscope was                         passed under direct vision. Throughout the procedure,                         the patient's blood pressure, pulse, and oxygen                         saturations were monitored continuously. The                         Colonoscope was introduced through the anus and  advanced to the the terminal ileum. The colonoscopy                         was performed without difficulty. The patient                         tolerated the procedure well. The quality of the bowel                         preparation was good. Findings:      The perianal and digital rectal examinations were normal.      The terminal ileum appeared normal.      A diffuse area of severely erythematous mucosa was found in the sigmoid       colon, in the descending colon, at the splenic flexure and in the distal       transverse colon. Biopsies were taken with a cold forceps for histology.       Estimated blood loss was minimal. The mucosa was normal from 30 cm to       the  rectum and normal from middle transverse to the TI. There was a       concerning area within the inflamed colon that was extremely friable       with abnormal tissue in the descending colon.. This was biopsied and a       tattoo was placed 2-3 cm distal to this area.      Internal hemorrhoids were found during retroflexion. The hemorrhoids       were Grade I (internal hemorrhoids that do not prolapse). Impression:            - The examined portion of the ileum was normal.                        - Erythematous mucosa in the sigmoid colon, in the                         descending colon, at the splenic flexure and in the                         distal transverse colon. Biopsied.                        - Internal hemorrhoids. Recommendation:        - Await pathology results.                        - Return patient to hospital ward for ongoing care.                        - Resume previous diet.                        - Continue present medications. Procedure Code(s):     --- Professional ---                        [id number], Colonoscopy, flexible; with biopsy, single or                         multiple Diagnosis Code(s):     ---  Professional ---                        K64.0, First degree hemorrhoids                        K63.89, Other specified diseases of intestine                        R93.3, Abnormal findings on diagnostic imaging of                         other parts of digestive tract CPT copyright 2019 American Medical Association. All rights reserved. The codes documented in this report are preliminary and upon coder review may  be revised to meet current compliance requirements. Andrey Farmer MD, MD 11/29/2020 3:02:11 PM Number of Addenda: 0 Note Initiated On: 11/29/2020 2:07 PM Scope Withdrawal Time: 0 hours 8 minutes 1 second  Total Procedure Duration: 0 hours 17 minutes 31 seconds  Estimated Blood Loss:  Estimated blood loss was minimal.      [facility]

## 2020-11-29 NOTE — Plan of Care (Signed)
Pt is alert and oriented x4. V/S stable. Placed on oxygen at 2lpm/[place]; oxygen sat dropped to 87% on RA. Pt has had several Bms. Oral prep 1/2 given as per Dr. Montine Circle. Complained of nausea; Zofran and compazine given.

## 2020-11-30 ENCOUNTER — Encounter: Payer: Self-pay | Admitting: Gastroenterology

## 2020-11-30 DIAGNOSIS — K625 Hemorrhage of anus and rectum: Secondary | ICD-10-CM | POA: Diagnosis not present

## 2020-11-30 LAB — GLUCOSE, CAPILLARY: Glucose-Capillary: 86 mg/dL (ref 70–99)

## 2020-11-30 LAB — CBC
HCT: 33.3 % — ABNORMAL LOW (ref 36.0–46.0)
Hemoglobin: 10.7 g/dL — ABNORMAL LOW (ref 12.0–15.0)
MCH: 31.1 pg (ref 26.0–34.0)
MCHC: 32.1 g/dL (ref 30.0–36.0)
MCV: 96.8 fL (ref 80.0–100.0)
Platelets: 131 10*3/uL — ABNORMAL LOW (ref 150–400)
RBC: 3.44 MIL/uL — ABNORMAL LOW (ref 3.87–5.11)
RDW: 14.7 % (ref 11.5–15.5)
WBC: 5.8 10*3/uL (ref 4.0–10.5)
nRBC: 0 % (ref 0.0–0.2)

## 2020-11-30 LAB — POTASSIUM: Potassium: 3.4 mmol/L — ABNORMAL LOW (ref 3.5–5.1)

## 2020-11-30 MED ORDER — POTASSIUM CHLORIDE CRYS ER 20 MEQ PO TBCR
40.0000 meq | EXTENDED_RELEASE_TABLET | Freq: Once | ORAL | Status: AC
  Administered 2020-11-30: 40 meq via ORAL
  Filled 2020-11-30: qty 2

## 2020-11-30 NOTE — Care Management Important Message (Signed)
Important Message  Patient Details  Name: Caitlin Leonard MRN: [id number] Date of Birth: July 16, 1946   Medicare Important Message Given:  Yes     Dannette Barbara 11/30/2020, 12:27 PM

## 2020-11-30 NOTE — Progress Notes (Signed)
Pt prepared for discharge home.  IV discontinued. Discharge instructions reviewed with pt and her husband.  Questions answered. Pt was transported off the unit via wheelchair by transport.

## 2020-11-30 NOTE — Discharge Instructions (Signed)
Follow-up [facility] as before. Your biopsy results will be followed up by G.[facility]. Dr. Haig Prophet.

## 2020-11-30 NOTE — Progress Notes (Signed)
PHARMACY - PHYSICIAN COMMUNICATION CRITICAL VALUE ALERT - BLOOD CULTURE IDENTIFICATION (BCID)  Caitlin Leonard is an 75 y.o. female who presented to [facility] on 11/28/2020 with a chief complaint of rectal bleeding and diarrhea  Assessment:  Patient with colitis and PMH of collagenous colitis.  Colonoscopy 2/24 revealed severe colitis.   2/23 Blood cultures with GPR in 1 of 4 bottles (AEROBIC bottle)  Name of physician (or Provider) Contacted: Dr Posey Pronto  Current antibiotics: none currently  Changes to prescribed antibiotics recommended:  Recommendations accepted by provider - likely contaminant without signs of infection so monitor off antibiotics  - If GPR found in another set or anaerobic bottle, consider adding ampicillin/sulbactam  No results found for this or any previous visit.  Doreene Eland, PharmD, BCPS.   Work Cell: [phone] 11/30/2020 9:04 AM

## 2020-11-30 NOTE — Discharge Summary (Signed)
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  SECONDARY DIAGNOSIS:   Past Medical History:  Diagnosis Date   Anemia    Anxiety    Arthritis    osteoarthritis   Asthma    due to seasonal alllergies   Atrial fibrillation ([place])    Carpal tunnel syndrome    Cataract    Cervicalgia    Collagenous colitis 2010   COPD (chronic obstructive pulmonary disease) (HCC)    Depression    situational   Diabetes mellitus without complication (HCC)    Dysrhythmia    GERD (gastroesophageal reflux disease)    GI bleed    Headache(784.0)    migraines; 2 x month   Heart murmur    MVP ( symptomatic)   History of IBS    Hyperlipemia    Hypertension    on medication x 10 years   Migraine    MVP (mitral valve prolapse)    Shortness of breath    exertional   Sinoatrial node dysfunction ([place])    Sleep apnea 2007   does not use CPAP since 40 # wt loss   Stroke [facility])    Syncopal episodes     HOSPITAL COURSE:   [place] a 75 y.o.femalewith medical history significant ofIBS, GIB, HTN, HLD, DM, COPD, stroke, A fib not on AC, OSA, depression with anxiety,collagenous colitis, Parkinson's disease,presents with rectal bleeding and diarrhea.  Pt states that shehad constipation earlier this week, but since yesterday she started having diarrhea,which then turned into large volumebloody diarrhea.  Rectal bleeding suspected due to inflammatory bowel disease/collagenase colitis --Hgb 13.6 -->13.2 -->12.5--10.8--10.7 -- Dr.  Haig Prophet is consulted -->plans to do EGD/Colonoscopy. -- CT scan showedcolitis. Per Dr. Haig Prophet, will hold antibiotics since patient does not have fever or leukocytosis. --stool studies negative -- Zofran IV for nausea - Avoid NSAIDs and SQ heparin - Hold ASA -EGD:- Small hiatal hernia. - Gastritis. Biopsied. - Normal examined duodenum. -colonoscopy: The examined portion of the ileum was normal. - Erythematous mucosa in the sigmoid colon, in the  descending colon, at the splenic flexure and in the  distal transverse colon. Biopsied. - Internal hemorrhoids. --2/25-- tolerating soft diet. No more rectal bleed. Hemoglobin stable. Dr. Haig Prophet recommends resuming budesonide and follow-up [facility] as before. Biopsy results will be followed up by G.I.  Essential hypertension -IV hydralazine as needed -On Cardizem, metoprolol  History ofCollagenous colitis: -continue Entocort -pt is getting q2week of Xolair injection--pt follows [facility]  Depression with anxiety -continue home meds  COPD (chronic obstructive pulmonary disease) ([place]): stable -Bronchodilators -wean to RA--no resp distress  Hyperlipemia -Crestor  Atrial fibrillation [facility]): Not taking anticoagulants. -Cardizem and metoprolol  Stroke (HCC) -Crestor  AKI (acute kidney injury) ([place]): Mild. Creatinine 1.17, BUN 21 Hypokalemia -Hold Lasix, replace K --IV fluid as above --creat down to 1.02  Parkinson's disease ([place]) -Sinemet     DVT ppx:SCD Code Status:DNR per her husband who is POA Family Communication:Yes, patient's husband by phone Disposition Plan: Anticipate discharge back to previous environment Consults called:Dr.Locklear of GI Admission status  and Level of care:Med-Surg: inpatient  Status is: Inpatient   Dispo: The patient is  from:Home Anticipated d/c is MW:NUUV Anticipated d/c date is: l today Patient currently ismedically stable to d/c. Difficult to place patient No CONSULTS OBTAINED:    DRUG ALLERGIES:   Allergies  Allergen Reactions   Contrast Media [Iodinated Diagnostic Agents] Anaphylaxis   Gadolinium Derivatives Anaphylaxis   Gadoversetamide Anaphylaxis   Shellfish Allergy Nausea And Vomiting and Other (See Comments)    Can eat shrimp but not oysters Dizziness,severe nausea and vomiting ( can eat shrimp CAN NOT EAT ORYSTERS) Other reaction(s): Other (See Comments) Dizziness,severe nausea and vomiting ( can eat shrimp CAN NOT EAT ORYSTERS)  Can eat shrimp but not oysters Dizziness,severe nausea and vomiting ( can eat shrimp CAN NOT EAT ORYSTERS)  Can eat shrimp but not oysters    Sulfa Antibiotics Hives and Other (See Comments)    GI upset GI upset GI upset  GI upset GI upset GI upset Other reaction(s): Other (See Comments) GI upset GI upset  GI upset GI upset    Hydroxychloroquine Sulfate Hives and Other (See Comments)    Severe hives, all over like chicken pox.   Banana Nausea And Vomiting and Other (See Comments)    Raw bananas   Limonene Other (See Comments)    GI upset GI upset    Morphine And Related    Nabumetone Other (See Comments)    Hypertension   Otezla [Apremilast]    Potassium-Containing Compounds Other (See Comments)    GI upset   Prednisone Nausea And Vomiting   Sulfasalazine Hives and Other (See Comments)    GI upset   Sulfonamide Derivatives Hives   Metronidazole Rash   Sulfamethoxazole Rash    DISCHARGE MEDICATIONS:   Allergies as of 11/30/2020      Reactions   Contrast Media [iodinated Diagnostic Agents] Anaphylaxis   Gadolinium Derivatives Anaphylaxis   Gadoversetamide Anaphylaxis   Shellfish Allergy Nausea And Vomiting, Other (See Comments)   Can eat shrimp but not  oysters Dizziness,severe nausea and vomiting ( can eat shrimp CAN NOT EAT ORYSTERS) Other reaction(s): Other (See Comments) Dizziness,severe nausea and vomiting ( can eat shrimp CAN NOT EAT ORYSTERS) Can eat shrimp but not oysters Dizziness,severe nausea and vomiting ( can eat shrimp CAN NOT EAT ORYSTERS) Can eat shrimp but not oysters   Sulfa Antibiotics Hives, Other (See Comments)   GI upset GI upset GI upset GI upset GI upset GI upset Other reaction(s): Other (See Comments) GI upset GI upset GI upset GI upset   Hydroxychloroquine Sulfate Hives, Other (See Comments)   Severe hives, all over like chicken pox.   Banana Nausea And Vomiting, Other (See Comments)   Raw bananas   Limonene Other (See Comments)   GI upset GI upset   Morphine And Related    Nabumetone Other (See Comments)   Hypertension   Otezla [apremilast]    Potassium-containing Compounds Other (See Comments)   GI upset   Prednisone Nausea And Vomiting   Sulfasalazine Hives, Other (See Comments)   GI upset   Sulfonamide Derivatives Hives   Metronidazole Rash   Sulfamethoxazole Rash      Medication List    STOP taking these medications   ondansetron 4 MG disintegrating tablet Commonly known as: ZOFRAN-ODT   Xolair 150 MG injection Generic drug: omalizumab     TAKE these medications   albuterol 108 (90 Base) MCG/ACT inhaler Commonly known as: VENTOLIN HFA Inhale 2 puffs into the  lungs every 6 (six) hours as needed for wheezing or shortness of breath.   ALPRAZolam 0.5 MG tablet Commonly known as: XANAX Take 0.5 mg by mouth at bedtime.   aspirin EC 81 MG tablet Take 81 mg by mouth daily.   budesonide 3 MG 24 hr capsule Commonly known as: ENTOCORT EC Take 3 mg by mouth daily. What changed: Another medication with the same name was removed. Continue taking this medication, and follow the directions you see here.   budesonide-formoterol 160-4.5 MCG/ACT inhaler Commonly known as:  Symbicort Inhale 2 puffs into the lungs 2 (two) times daily for 1 day.   busPIRone 5 MG tablet Commonly known as: BUSPAR Take 5 mg by mouth 3 (three) times daily.   carbidopa-levodopa 25-100 MG tablet Commonly known as: SINEMET IR Take 2 tablets by mouth 3 (three) times daily.   carboxymethylcellulose 0.5 % Soln Commonly known as: REFRESH PLUS Place 1 drop into both eyes 2 (two) times daily.   cetirizine 10 MG tablet Commonly known as: ZYRTEC TAKE 1 TABLET(10 MG) BY MOUTH AT BEDTIME   diltiazem 180 MG 24 hr capsule Commonly known as: CARDIZEM CD Take 180 mg by mouth daily.   EpiPen 2-Pak 0.3 mg/0.3 mL Soaj injection Generic drug: EPINEPHrine Inject 0.3 mg into the muscle once.   furosemide 20 MG tablet Commonly known as: LASIX Take 20 mg by mouth daily.   hyoscyamine 0.125 MG tablet Commonly known as: LEVSIN Take 0.125 mg by mouth every 4 (four) hours as needed.   loperamide 2 MG capsule Commonly known as: IMODIUM Take 1 capsule (2 mg total) by mouth every 6 (six) hours as needed for diarrhea or loose stools.   memantine 10 MG tablet Commonly known as: NAMENDA Take 10 mg by mouth 2 (two) times daily.   metoprolol tartrate 50 MG tablet Commonly known as: LOPRESSOR TAKE 1 TABLET(50 MG) BY MOUTH TWICE DAILY What changed: See the new instructions.   omeprazole 40 MG capsule Commonly known as: PRILOSEC TAKE 1 CAPSULE BY MOUTH DAILY AT DINNER TIME What changed: See the new instructions.   prazosin 1 MG capsule Commonly known as: MINIPRESS Take 1 mg by mouth at bedtime.   rosuvastatin 40 MG tablet Commonly known as: CRESTOR Take 1 tablet (40 mg total) by mouth daily.   sertraline 100 MG tablet Commonly known as: ZOLOFT Take 150 mg by mouth daily.   Vitamin D (Ergocalciferol) 1.25 MG (50000 UNIT) Caps capsule Commonly known as: DRISDOL Take 50,000 Units by mouth once a week.       If you experience worsening of your admission symptoms, develop shortness  of breath, life threatening emergency, suicidal or homicidal thoughts you must seek medical attention immediately by calling 911 or calling your MD immediately  if symptoms less severe.  You Must read complete instructions/literature along with all the possible adverse reactions/side effects for all the Medicines you take and that have been prescribed to you. Take any new Medicines after you have completely understood and accept all the possible adverse reactions/side effects.   Please note  You were cared for by a hospitalist during your hospital stay. If you have any questions about your discharge medications or the care you received while you were in the hospital after you are discharged, you can call the unit and asked to speak with the hospitalist on call if the hospitalist that took care of you is not available. Once you are discharged, your primary care physician will handle any further medical  issues. Please note that NO REFILLS for any discharge medications will be authorized once you are discharged, as it is imperative that you return to your primary care physician (or establish a relationship with a primary care physician if you do not have one) for your aftercare needs so that they can reassess your need for medications and monitor your lab values.   DATA REVIEW:   CBC  Recent Labs  Lab 11/30/20 0423  WBC 5.8  HGB 10.7*  HCT 33.3*  PLT 131*    Chemistries  Recent Labs  Lab 11/28/20 0818 11/29/20 0358 11/30/20 1033  NA 136 134*  --   K 3.9 3.0* 3.4*  CL 98 102  --   CO2 28 25  --   GLUCOSE 169* 119*  --   BUN 21 22  --   CREATININE 1.17* 1.02*  --   CALCIUM 9.6 8.7*  --   AST 27  --   --   ALT 14  --   --   ALKPHOS 65  --   --   BILITOT 0.9  --   --     Microbiology Results   Recent Results (from the past 240 hour(s))  Resp Panel by RT-PCR (Flu A&B, Covid) Nasopharyngeal Swab     Status: None   Collection Time: 11/28/20  8:18 AM   Specimen: Nasopharyngeal  Swab; Nasopharyngeal(NP) swabs in vial transport medium  Result Value Ref Range Status   SARS Coronavirus 2 by RT PCR NEGATIVE NEGATIVE Final    Comment: (NOTE) SARS-CoV-2 target nucleic acids are NOT DETECTED.  The SARS-CoV-2 RNA is generally detectable in upper respiratory specimens during the acute phase of infection. The lowest concentration of SARS-CoV-2 viral copies this assay can detect is 138 copies/mL. A negative result does not preclude SARS-Cov-2 infection and should not be used as the sole basis for treatment or other patient management decisions. A negative result may occur with  improper specimen collection/handling, submission of specimen other than nasopharyngeal swab, presence of viral mutation(s) within the areas targeted by this assay, and inadequate number of viral copies(<138 copies/mL). A negative result must be combined with clinical observations, patient history, and epidemiological information. The expected result is Negative.  Fact Sheet for Patients:  EntrepreneurPulse.com.au  Fact Sheet for Healthcare Providers:  IncredibleEmployment.be  This test is no t yet approved or cleared by the [place] FDA and  has been authorized for detection and/or diagnosis of SARS-CoV-2 by FDA under an Emergency Use Authorization (EUA). This EUA will remain  in effect (meaning this test can be used) for the duration of the COVID-19 declaration under Section 564(b)(1) of the Act, 21 U.S.C.section 360bbb-3(b)(1), unless the authorization is terminated  or revoked sooner.       Influenza A by PCR NEGATIVE NEGATIVE Final   Influenza B by PCR NEGATIVE NEGATIVE Final    Comment: (NOTE) The Xpert Xpress SARS-CoV-2/FLU/RSV plus assay is intended as an aid in the diagnosis of influenza from Nasopharyngeal swab specimens and should not be used as a sole basis for treatment. Nasal washings and aspirates are unacceptable for Xpert Xpress  SARS-CoV-2/FLU/RSV testing.  Fact Sheet for Patients: EntrepreneurPulse.com.au  Fact Sheet for Healthcare Providers: IncredibleEmployment.be  This test is not yet approved or cleared by the [place] FDA and has been authorized for detection and/or diagnosis of SARS-CoV-2 by FDA under an Emergency Use Authorization (EUA). This EUA will remain in effect (meaning this test can be used) for the duration of the  COVID-19 declaration under Section 564(b)(1) of the Act, 21 U.S.C. section 360bbb-3(b)(1), unless the authorization is terminated or revoked.  Performed at [facility], [address] [place], [place] [address]   CULTURE, BLOOD (ROUTINE X 2) w Reflex to ID Panel     Status: None (Preliminary result)   Collection Time: 11/28/20 10:42 AM   Specimen: BLOOD  Result Value Ref Range Status   Specimen Description BLOOD LEFT ANTECUBITAL  Final   Special Requests   Final    BOTTLES DRAWN AEROBIC AND ANAEROBIC Blood Culture adequate volume   Culture   Final    NO GROWTH 2 DAYS Performed at [facility], [address] [place], [place] [address]    Report Status PENDING  Incomplete  CULTURE, BLOOD (ROUTINE X 2) w Reflex to ID Panel     Status: None (Preliminary result)   Collection Time: 11/28/20 10:42 AM   Specimen: BLOOD  Result Value Ref Range Status   Specimen Description BLOOD RIGHT ANTECUBITAL  Final   Special Requests   Final    BOTTLES DRAWN AEROBIC AND ANAEROBIC Blood Culture adequate volume   Culture  Setup Time   Final    GRAM POSITIVE RODS AEROBIC BOTTLE ONLY CRITICAL RESULT CALLED TO, READ BACK BY AND VERIFIED WITH: SUSAN WATSON AT 6222 11/30/20 [place] Performed at [facility], [address] [place], [place] [address]    Culture GRAM POSITIVE RODS  Final   Report Status PENDING  Incomplete  Gastrointestinal Panel by PCR , Stool     Status: None   Collection Time: 11/28/20 10:42 AM   Specimen:  Stool  Result Value Ref Range Status   Campylobacter species NOT DETECTED NOT DETECTED Final   Plesimonas shigelloides NOT DETECTED NOT DETECTED Final   Salmonella species NOT DETECTED NOT DETECTED Final   Yersinia enterocolitica NOT DETECTED NOT DETECTED Final   Vibrio species NOT DETECTED NOT DETECTED Final   Vibrio cholerae NOT DETECTED NOT DETECTED Final   Enteroaggregative E coli (EAEC) NOT DETECTED NOT DETECTED Final   Enteropathogenic E coli (EPEC) NOT DETECTED NOT DETECTED Final   Enterotoxigenic E coli (ETEC) NOT DETECTED NOT DETECTED Final   Shiga like toxin producing E coli (STEC) NOT DETECTED NOT DETECTED Final   Shigella/Enteroinvasive E coli (EIEC) NOT DETECTED NOT DETECTED Final   Cryptosporidium NOT DETECTED NOT DETECTED Final   Cyclospora cayetanensis NOT DETECTED NOT DETECTED Final   Entamoeba histolytica NOT DETECTED NOT DETECTED Final   Giardia lamblia NOT DETECTED NOT DETECTED Final   Adenovirus F40/41 NOT DETECTED NOT DETECTED Final   Astrovirus NOT DETECTED NOT DETECTED Final   Norovirus GI/GII NOT DETECTED NOT DETECTED Final   Rotavirus A NOT DETECTED NOT DETECTED Final   Sapovirus (I, II, IV, and V) NOT DETECTED NOT DETECTED Final    Comment: Performed at [facility], [address] [place], [place] [address]  Calprotectin, Fecal     Status: Abnormal   Collection Time: 11/28/20 11:57 AM   Specimen: Stool  Result Value Ref Range Status   Calprotectin, Fecal 371 (H) 0 - 120 ug/g Final    Comment: (NOTE) Concentration     Interpretation   Follow-Up <16 - 50 ug/g     Normal           None >50 -120 ug/g     Borderline       Re-evaluate in 4-6 weeks    >120 ug/g     Abnormal  Repeat as clinically                                   indicated Performed At: [facility] [place], [place] [id number] Rush Farmer MD CH:[id number]     RADIOLOGY:  No results found.   CODE STATUS:     Code Status Orders  (From  admission, onward)         Start     Ordered   11/28/20 1557  Do not attempt resuscitation (DNR)  Continuous        11/28/20 1556        Code Status History    Date Active Date Inactive Code Status Order ID Comments User Context   11/28/2020 1307 11/28/2020 1556 Full Code [id number]  Ivor Costa, MD Inpatient   11/28/2020 1047 11/28/2020 1307 DNR [id number]  Ivor Costa, MD ED   04/30/2019 0801 05/05/2019 2023 DNR [id number]  Epifanio Lesches, MD ED   04/30/2019 0718 04/30/2019 0801 Full Code [id number]  Epifanio Lesches, MD ED   04/07/2019 0101 04/10/2019 1737 Full Code [id number]  Seals, Theo Dills, NP ED   Advance Care Planning Activity    Advance Directive Documentation   Flowsheet Row Most Recent Value  Type of Advance Directive Healthcare Power of Attorney  Pre-existing out of facility DNR order (yellow form or pink MOST form) --  "MOST" Form in Place? --       TOTAL TIME TAKING CARE OF THIS PATIENT: *35 minutes.    Fritzi Mandes M.D  [facility]    CC: Primary care physician; Maryland Pink, MD

## 2020-11-30 NOTE — Progress Notes (Signed)
[facility] NAME: Caitlin Leonard    MR#:  [id number]  DATE OF BIRTH:  September 15, 1946  SUBJECTIVE:   Patient has history of colitis.   Patient tolerating soft diet. No more rectal bleed. Had some runny output likely left over prep. No abdominal pain Follows with [facility] G.I. REVIEW OF SYSTEMS:   Review of Systems  Constitutional: Negative for chills, fever and weight loss.  HENT: Negative for ear discharge, ear pain and nosebleeds.   Eyes: Negative for blurred vision, pain and discharge.  Respiratory: Negative for sputum production, shortness of breath, wheezing and stridor.   Cardiovascular: Negative for chest pain, palpitations, orthopnea and PND.  Gastrointestinal: Positive for blood in stool and diarrhea. Negative for abdominal pain, nausea and vomiting.  Genitourinary: Negative for frequency and urgency.  Musculoskeletal: Negative for back pain and joint pain.  Neurological: Positive for weakness. Negative for sensory change, speech change and focal weakness.  Psychiatric/Behavioral: Negative for depression and hallucinations. The patient is not nervous/anxious.    Tolerating Diet:npo Tolerating PT:   DRUG ALLERGIES:   Allergies  Allergen Reactions  . Contrast Media [Iodinated Diagnostic Agents] Anaphylaxis  . Gadolinium Derivatives Anaphylaxis  . Gadoversetamide Anaphylaxis  . Shellfish Allergy Nausea And Vomiting and Other (See Comments)    Can eat shrimp but not oysters Dizziness,severe nausea and vomiting ( can eat shrimp CAN NOT EAT ORYSTERS) Other reaction(s): Other (See Comments) Dizziness,severe nausea and vomiting ( can eat shrimp CAN NOT EAT ORYSTERS)  Can eat shrimp but not oysters Dizziness,severe nausea and vomiting ( can eat shrimp CAN NOT EAT ORYSTERS)  Can eat shrimp but not oysters   . Sulfa Antibiotics Hives and Other (See Comments)    GI upset GI upset GI upset  GI upset GI upset GI upset Other  reaction(s): Other (See Comments) GI upset GI upset  GI upset GI upset   . Hydroxychloroquine Sulfate Hives and Other (See Comments)    Severe hives, all over like chicken pox.  . Banana Nausea And Vomiting and Other (See Comments)    Raw bananas  . Limonene Other (See Comments)    GI upset GI upset   . Morphine And Related   . Nabumetone Other (See Comments)    Hypertension  . Otezla [Apremilast]   . Potassium-Containing Compounds Other (See Comments)    GI upset  . Prednisone Nausea And Vomiting  . Sulfasalazine Hives and Other (See Comments)    GI upset  . Sulfonamide Derivatives Hives  . Metronidazole Rash  . Sulfamethoxazole Rash    VITALS:  Blood pressure (!) 113/45, pulse 73, temperature 97.8 F (36.6 C), temperature source Oral, resp. rate 20, height 5\' 9"  (1.753 m), weight 113 kg, SpO2 92 %.  PHYSICAL EXAMINATION:   Physical Exam  GENERAL:  75 y.o.-year-old patient lying in the bed with no acute distress. Morbid obese LUNGS: Normal breath sounds bilaterally, no wheezing, rales, rhonchi. No use of accessory muscles of respiration.  CARDIOVASCULAR: S1, S2 normal. No murmurs, rubs, or gallops.  ABDOMEN: Soft, nontender, nondistended. Bowel sounds present. EXTREMITIES: No cyanosis, clubbing or edema b/l.    NEUROLOGIC: Cranial nerves II through XII are intact. No focal Motor or sensory deficits b/l. Non focal PSYCHIATRIC:  patient is alert and oriented x 3.  SKIN: No obvious rash, lesion, or ulcer.   LABORATORY PANEL:  CBC Recent Labs  Lab 11/30/20 0423  WBC 5.8  HGB 10.7*  HCT 33.3*  PLT  [place]  Lab 11/28/20 0818 11/29/20 0358 11/30/20 1033  NA 136 134*  --   K 3.9 3.0* 3.4*  CL 98 102  --   CO2 28 25  --   GLUCOSE 169* 119*  --   BUN 21 22  --   CREATININE 1.17* 1.02*  --   CALCIUM 9.6 8.7*  --   AST 27  --   --   ALT 14  --   --   ALKPHOS 65  --   --   BILITOT 0.9  --   --    Cardiac Enzymes No results for  input(s): TROPONINI in the last 168 hours. RADIOLOGY:  No results found. ASSESSMENT AND PLAN:   Caitlin Leonard is a 75 y.o. female with medical history significant of IBS, GIB, HTN, HLD, DM, COPD, stroke, A fib not on AC, OSA, depression with anxiety, collagenous colitis, Parkinson's disease, presents with rectal bleeding and diarrhea.  Pt states that she had constipation earlier this week, but since yesterday she started having diarrhea, which then turned into large volume bloody diarrhea.  Rectal bleeding: Hgb 13.6 -->13.2 -->12.5--10.8--10.7 -- Dr. Haig Prophet is consulted --> plans to do  EGD/Colonoscopy. -- CT scan showed colitis.  Per Dr. Haig Prophet, will hold antibiotics since patient does not have fever or leukocytosis. --stool studies negative -- Zofran IV for nausea - Avoid NSAIDs and SQ heparin -  Hold ASA -EGD:- Small hiatal hernia.                        - Gastritis. Biopsied.                        - Normal examined duodenum. -colonoscopy: The examined portion of the ileum was normal.                        - Erythematous mucosa in the sigmoid colon, in the                         descending colon, at the splenic flexure and in the                         distal transverse colon. Biopsied.                        - Internal hemorrhoids. --2/25-- tolerating soft diet. No more rectal bleed. Hemoglobin stable. Awaiting G.I. recommendation  Essential hypertension -IV hydralazine as needed -On Cardizem, metoprolol  History of Collagenous colitis: -continue Entocort -pt is getting q2week of Xolair injection--pt follows [facility]  Depression with anxiety -continue home meds  COPD (chronic obstructive pulmonary disease) ([place]): stable -Bronchodilators -wean to RA--no resp distress  Hyperlipemia -Crestor  Atrial fibrillation [facility]): Not taking anticoagulants. -Cardizem and metoprolol  Stroke (HCC) -Crestor  AKI (acute kidney injury) ([place]): Mild.  Creatinine 1.17,  BUN 21 Hypokalemia -Hold Lasix, replace K --IV fluid as above --creat down to 1.02  Parkinson's disease ([place]) -Sinemet     DVT ppx: SCD Code Status: DNR per her husband who is POA Family Communication:  Yes, patient's husband by phone Disposition Plan:  Anticipate discharge back to previous environment Consults called:  Dr. Haig Prophet of GI Admission status and Level of care: Med-Surg: inpatient   Status is: Inpatient  Dispo: The patient is from: Home  Anticipated d/c is to: Home  Anticipated d/c date is: likely today pending final G.I. discharge disposition plan.  Patient currently ismedically stable to d/c.              Difficult to place patient No   TOTAL TIME TAKING CARE OF THIS PATIENT: *25* minutes.  >50% time spent on counselling and coordination of care  Note: This dictation was prepared with Dragon dictation along with smaller phrase technology. Any transcriptional errors that result from this process are unintentional.  Fritzi Mandes M.D    [facility]   CC: Primary care physician; [place] Pink, MDPatient ID: Caitlin Leonard, female   DOB: 1946-02-01, 75 y.o.   MRN: [id number]

## 2020-12-02 LAB — CULTURE, BLOOD (ROUTINE X 2): Special Requests: ADEQUATE

## 2020-12-03 LAB — CULTURE, BLOOD (ROUTINE X 2)
Culture: NO GROWTH
Special Requests: ADEQUATE

## 2020-12-03 LAB — SURGICAL PATHOLOGY

## 2020-12-20 DIAGNOSIS — J410 Simple chronic bronchitis: Secondary | ICD-10-CM | POA: Diagnosis not present

## 2020-12-20 DIAGNOSIS — K52831 Collagenous colitis: Secondary | ICD-10-CM | POA: Diagnosis not present

## 2020-12-20 DIAGNOSIS — E1159 Type 2 diabetes mellitus with other circulatory complications: Secondary | ICD-10-CM | POA: Diagnosis not present

## 2020-12-20 DIAGNOSIS — I959 Hypotension, unspecified: Secondary | ICD-10-CM | POA: Diagnosis not present

## 2020-12-20 DIAGNOSIS — G20 Parkinson's disease: Secondary | ICD-10-CM

## 2020-12-20 DIAGNOSIS — F39 Unspecified mood [affective] disorder: Secondary | ICD-10-CM

## 2020-12-20 DIAGNOSIS — I480 Paroxysmal atrial fibrillation: Secondary | ICD-10-CM

## 2020-12-20 DIAGNOSIS — I10 Essential (primary) hypertension: Secondary | ICD-10-CM

## 2020-12-25 DIAGNOSIS — I872 Venous insufficiency (chronic) (peripheral): Secondary | ICD-10-CM | POA: Diagnosis not present

## 2021-02-22 ENCOUNTER — Ambulatory Visit (INDEPENDENT_AMBULATORY_CARE_PROVIDER_SITE_OTHER): Payer: Medicare PPO | Admitting: Behavioral Health

## 2021-02-22 ENCOUNTER — Other Ambulatory Visit: Payer: Self-pay

## 2021-02-22 VITALS — BP 112/66 | HR 64 | Ht 69.0 in | Wt 262.0 lb

## 2021-02-22 DIAGNOSIS — F411 Generalized anxiety disorder: Secondary | ICD-10-CM

## 2021-02-22 DIAGNOSIS — F331 Major depressive disorder, recurrent, moderate: Secondary | ICD-10-CM

## 2021-02-22 MED ORDER — ALPRAZOLAM 0.5 MG PO TABS: 0.5000 mg | ORAL_TABLET | Freq: Every day | ORAL | 1 refills | Status: DC

## 2021-02-22 MED ORDER — SERTRALINE HCL 100 MG PO TABS: 200.0000 mg | ORAL_TABLET | Freq: Every day | ORAL | 3 refills | Status: DC

## 2021-02-22 MED ORDER — BUSPIRONE HCL 10 MG PO TABS: 10.0000 mg | ORAL_TABLET | Freq: Three times a day (TID) | ORAL | 3 refills | Status: DC

## 2021-02-24 ENCOUNTER — Encounter: Payer: Self-pay | Admitting: Behavioral Health
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
## 2021-02-28 DIAGNOSIS — R296 Repeated falls: Secondary | ICD-10-CM | POA: Insufficient documentation

## 2021-02-28 DIAGNOSIS — K580 Irritable bowel syndrome with diarrhea: Secondary | ICD-10-CM | POA: Insufficient documentation

## 2021-02-28 DIAGNOSIS — C4490 Unspecified malignant neoplasm of skin, unspecified: Secondary | ICD-10-CM | POA: Insufficient documentation

## 2021-04-05 ENCOUNTER — Ambulatory Visit: Payer: Medicare PPO | Admitting: Behavioral Health

## 2021-04-12 ENCOUNTER — Telehealth: Payer: Self-pay | Admitting: Pulmonary Disease

## 2021-04-12 NOTE — Telephone Encounter (Signed)
Spoke to patient's husband, Barry(DPR). Alvester Chou stated that [facility] pulmonary recommended six minute walk test here within the next week.   Dr. Patsey Berthold, please advise if okay to order? Thanks

## 2021-04-12 NOTE — Telephone Encounter (Signed)
We have not seen her in over a year.  I was concerned about hypersensitivity pneumonitis and this was explained to her on her last via phone visit.  Since we have not seen her in over a year [facility] can order the 6-minute walk through cardiopulmonary department at [facility].

## 2021-04-12 NOTE — Telephone Encounter (Signed)
Patient's spouse, Barry(DPR) is aware of recommendations and voiced his understanding.  Nothing further needed at this time.

## 2021-06-16 ENCOUNTER — Other Ambulatory Visit: Payer: Self-pay | Admitting: Behavioral Health

## 2021-06-16 DIAGNOSIS — F411 Generalized anxiety disorder: Secondary | ICD-10-CM

## 2021-06-16 DIAGNOSIS — F331 Major depressive disorder, recurrent, moderate: Secondary | ICD-10-CM

## 2021-06-17 NOTE — Telephone Encounter (Signed)
Please schedule appt

## 2021-06-18 NOTE — Telephone Encounter (Signed)
Pt is going to a different provider

## 2021-08-07 ENCOUNTER — Other Ambulatory Visit
Admission: RE | Admit: 2021-08-07 | Discharge: 2021-08-07 | Disposition: A | Discharge status: Home / Self Care | Payer: Medicare PPO | Attending: Internal Medicine | Admitting: Internal Medicine

## 2021-08-07 DIAGNOSIS — E860 Dehydration: Secondary | ICD-10-CM | POA: Diagnosis present

## 2021-08-07 LAB — BASIC METABOLIC PANEL
Anion gap: 8 (ref 5–15)
BUN: 14 mg/dL (ref 8–23)
CO2: 28 mmol/L (ref 22–32)
Calcium: 9.3 mg/dL (ref 8.9–10.3)
Chloride: 101 mmol/L (ref 98–111)
Creatinine, Ser: 0.99 mg/dL (ref 0.44–1.00)
GFR, Estimated: >60 mL/min (ref >60)
Glucose, Bld: 159 mg/dL — ABNORMAL HIGH (ref 70–99)
Potassium: 3.9 mmol/L (ref 3.5–5.1)
Sodium: 137 mmol/L (ref 135–145)

## 2021-09-17 ENCOUNTER — Other Ambulatory Visit: Payer: Self-pay | Admitting: Physical Medicine & Rehabilitation

## 2021-09-17 DIAGNOSIS — G8929 Other chronic pain: Secondary | ICD-10-CM

## 2021-10-02 ENCOUNTER — Other Ambulatory Visit: Payer: Self-pay

## 2021-10-02 ENCOUNTER — Ambulatory Visit
Admission: RE | Admit: 2021-10-02 | Discharge: 2021-10-02 | Disposition: A | Discharge status: Home / Self Care | Payer: Medicare PPO | Source: Ambulatory Visit | Attending: Physical Medicine & Rehabilitation | Admitting: Physical Medicine & Rehabilitation

## 2021-10-02 DIAGNOSIS — M5442 Lumbago with sciatica, left side: Secondary | ICD-10-CM | POA: Diagnosis present

## 2021-10-02 DIAGNOSIS — G8929 Other chronic pain: Secondary | ICD-10-CM | POA: Diagnosis present

## 2021-10-13 ENCOUNTER — Other Ambulatory Visit: Payer: Self-pay | Admitting: Behavioral Health

## 2021-10-13 DIAGNOSIS — F411 Generalized anxiety disorder: Secondary | ICD-10-CM

## 2021-10-13 DIAGNOSIS — F331 Major depressive disorder, recurrent, moderate: Secondary | ICD-10-CM

## 2021-10-14 NOTE — Telephone Encounter (Signed)
Please schedule appt

## 2021-10-18 NOTE — Telephone Encounter (Signed)
Patient not returning to office

## 2021-11-18 ENCOUNTER — Emergency Department: Payer: Medicare PPO

## 2021-11-18 ENCOUNTER — Other Ambulatory Visit: Payer: Self-pay

## 2021-11-18 ENCOUNTER — Observation Stay
Admission: EM | Admit: 2021-11-18 | Discharge: 2021-11-19 | Disposition: A | Discharge status: Home / Self Care | Payer: Medicare PPO | Attending: Emergency Medicine | Admitting: Emergency Medicine

## 2021-11-18 DIAGNOSIS — E119 Type 2 diabetes mellitus without complications: Secondary | ICD-10-CM | POA: Diagnosis not present

## 2021-11-18 DIAGNOSIS — J449 Chronic obstructive pulmonary disease, unspecified: Secondary | ICD-10-CM | POA: Insufficient documentation

## 2021-11-18 DIAGNOSIS — F418 Other specified anxiety disorders: Secondary | ICD-10-CM | POA: Diagnosis present

## 2021-11-18 DIAGNOSIS — R0602 Shortness of breath: Secondary | ICD-10-CM | POA: Diagnosis present

## 2021-11-18 DIAGNOSIS — F411 Generalized anxiety disorder: Secondary | ICD-10-CM

## 2021-11-18 DIAGNOSIS — R7989 Other specified abnormal findings of blood chemistry: Secondary | ICD-10-CM | POA: Diagnosis present

## 2021-11-18 DIAGNOSIS — F331 Major depressive disorder, recurrent, moderate: Secondary | ICD-10-CM

## 2021-11-18 DIAGNOSIS — Z7982 Long term (current) use of aspirin: Secondary | ICD-10-CM | POA: Insufficient documentation

## 2021-11-18 DIAGNOSIS — G20 Parkinson's disease: Secondary | ICD-10-CM | POA: Diagnosis not present

## 2021-11-18 DIAGNOSIS — I480 Paroxysmal atrial fibrillation: Secondary | ICD-10-CM | POA: Diagnosis present

## 2021-11-18 DIAGNOSIS — Z96652 Presence of left artificial knee joint: Secondary | ICD-10-CM | POA: Insufficient documentation

## 2021-11-18 DIAGNOSIS — R0989 Other specified symptoms and signs involving the circulatory and respiratory systems: Secondary | ICD-10-CM

## 2021-11-18 DIAGNOSIS — U071 COVID-19: Secondary | ICD-10-CM | POA: Diagnosis not present

## 2021-11-18 DIAGNOSIS — Z79899 Other long term (current) drug therapy: Secondary | ICD-10-CM | POA: Diagnosis not present

## 2021-11-18 DIAGNOSIS — J45909 Unspecified asthma, uncomplicated: Secondary | ICD-10-CM | POA: Diagnosis not present

## 2021-11-18 DIAGNOSIS — I4891 Unspecified atrial fibrillation: Secondary | ICD-10-CM | POA: Diagnosis present

## 2021-11-18 DIAGNOSIS — E785 Hyperlipidemia, unspecified: Secondary | ICD-10-CM | POA: Diagnosis present

## 2021-11-18 DIAGNOSIS — F32A Depression, unspecified: Secondary | ICD-10-CM | POA: Diagnosis present

## 2021-11-18 DIAGNOSIS — G4733 Obstructive sleep apnea (adult) (pediatric): Secondary | ICD-10-CM | POA: Diagnosis present

## 2021-11-18 DIAGNOSIS — I639 Cerebral infarction, unspecified: Secondary | ICD-10-CM | POA: Diagnosis present

## 2021-11-18 DIAGNOSIS — I10 Essential (primary) hypertension: Secondary | ICD-10-CM | POA: Insufficient documentation

## 2021-11-18 DIAGNOSIS — E669 Obesity, unspecified: Secondary | ICD-10-CM | POA: Diagnosis present

## 2021-11-18 LAB — BASIC METABOLIC PANEL
Anion gap: 9 (ref 5–15)
BUN: 19 mg/dL (ref 8–23)
CO2: 26 mmol/L (ref 22–32)
Calcium: 10 mg/dL (ref 8.9–10.3)
Chloride: 102 mmol/L (ref 98–111)
Creatinine, Ser: 0.81 mg/dL (ref 0.44–1.00)
GFR, Estimated: >60 mL/min (ref >60)
Glucose, Bld: 189 mg/dL — ABNORMAL HIGH (ref 70–99)
Potassium: 3.7 mmol/L (ref 3.5–5.1)
Sodium: 137 mmol/L (ref 135–145)

## 2021-11-18 LAB — HEPATIC FUNCTION PANEL
ALT: 12 U/L (ref 0–44)
AST: 23 U/L (ref 15–41)
Albumin: 3.9 g/dL (ref 3.5–5.0)
Alkaline Phosphatase: 67 U/L (ref 38–126)
Bilirubin, Direct: 0.2 mg/dL (ref 0.0–0.2)
Indirect Bilirubin: 0.9 mg/dL (ref 0.3–0.9)
Total Bilirubin: 1.1 mg/dL (ref 0.3–1.2)
Total Protein: 7.1 g/dL (ref 6.5–8.1)

## 2021-11-18 LAB — CBC
HCT: 45.3 % (ref 36.0–46.0)
Hemoglobin: 14.8 g/dL (ref 12.0–15.0)
MCH: 33 pg (ref 26.0–34.0)
MCHC: 32.7 g/dL (ref 30.0–36.0)
MCV: 100.9 fL — ABNORMAL HIGH (ref 80.0–100.0)
Platelets: 183 10*3/uL (ref 150–400)
RBC: 4.49 MIL/uL (ref 3.87–5.11)
RDW: 15.3 % (ref 11.5–15.5)
WBC: 6.5 10*3/uL (ref 4.0–10.5)
nRBC: 0 % (ref 0.0–0.2)

## 2021-11-18 LAB — MAGNESIUM: Magnesium: 2 mg/dL (ref 1.7–2.4)

## 2021-11-18 LAB — RESP PANEL BY RT-PCR (FLU A&B, COVID) ARPGX2
Influenza A by PCR: NEGATIVE
Influenza B by PCR: NEGATIVE
SARS Coronavirus 2 by RT PCR: POSITIVE — AB

## 2021-11-18 LAB — T4, FREE: Free T4: 0.86 ng/dL (ref 0.61–1.12)

## 2021-11-18 LAB — D-DIMER, QUANTITATIVE: D-Dimer, Quant: 0.66 ug/mL-FEU — ABNORMAL HIGH (ref 0.00–0.50)

## 2021-11-18 LAB — TROPONIN I (HIGH SENSITIVITY)
Troponin I (High Sensitivity): 14 ng/L (ref <18)
Troponin I (High Sensitivity): 11 ng/L (ref <18)

## 2021-11-18 LAB — TSH: TSH: 6.548 u[IU]/mL — ABNORMAL HIGH (ref 0.350–4.500)

## 2021-11-18 LAB — BRAIN NATRIURETIC PEPTIDE: B Natriuretic Peptide: 26.4 pg/mL (ref 0.0–100.0)

## 2021-11-18 MED ORDER — ACETAMINOPHEN 500 MG PO TABS: 1000.0000 mg | ORAL_TABLET | Freq: Four times a day (QID) | ORAL | Status: DC | PRN

## 2021-11-18 MED ORDER — ONDANSETRON HCL 4 MG/2ML IJ SOLN: 4.0000 mg | Freq: Four times a day (QID) | INTRAMUSCULAR | Status: DC | PRN

## 2021-11-18 MED ORDER — PROPRANOLOL HCL 20 MG PO TABS
20.0000 mg | ORAL_TABLET | Freq: Every day | ORAL | Status: DC
  Administered 2021-11-19 (×2): 20 mg via ORAL
  Filled 2021-11-18 (×2): qty 1

## 2021-11-18 MED ORDER — SERTRALINE HCL 50 MG PO TABS
50.0000 mg | ORAL_TABLET | Freq: Every day | ORAL | Status: DC
  Administered 2021-11-19: 50 mg via ORAL
  Filled 2021-11-18: qty 1

## 2021-11-18 MED ORDER — HEPARIN (PORCINE) 25000 UT/250ML-% IV SOLN
1250.0000 [IU]/h | INTRAVENOUS | Status: DC
  Administered 2021-11-18: 22:00:00 1450 [IU]/h via INTRAVENOUS
  Filled 2021-11-18: qty 250

## 2021-11-18 MED ORDER — LABETALOL HCL 5 MG/ML IV SOLN: 10.0000 mg | INTRAVENOUS | Status: DC | PRN

## 2021-11-18 MED ORDER — BUSPIRONE HCL 10 MG PO TABS
10.0000 mg | ORAL_TABLET | Freq: Two times a day (BID) | ORAL | Status: DC
  Administered 2021-11-19 (×2): 10 mg via ORAL
  Filled 2021-11-18 (×2): qty 1

## 2021-11-18 MED ORDER — HEPARIN BOLUS VIA INFUSION
5400.0000 [IU] | Freq: Once | INTRAVENOUS | Status: AC
  Administered 2021-11-18: 5400 [IU] via INTRAVENOUS
  Filled 2021-11-18: qty 5400

## 2021-11-18 MED ORDER — ALPRAZOLAM 0.5 MG PO TABS
0.5000 mg | ORAL_TABLET | Freq: Every day | ORAL | Status: DC
  Administered 2021-11-19: 0.5 mg via ORAL
  Filled 2021-11-18: qty 1

## 2021-11-18 MED ORDER — LACTATED RINGERS IV BOLUS
1000.0000 mL | Freq: Once | INTRAVENOUS | Status: AC
  Administered 2021-11-18: 1000 mL via INTRAVENOUS

## 2021-11-18 MED ORDER — BUDESONIDE 3 MG PO CPEP
9.0000 mg | ORAL_CAPSULE | Freq: Every day | ORAL | Status: DC
Start: 2021-11-18 — End: 2021-11-19
  Administered 2021-11-19 (×2): 9 mg via ORAL
  Filled 2021-11-18 (×3): qty 3

## 2021-11-18 MED ORDER — ACETAMINOPHEN 650 MG RE SUPP: 650.0000 mg | Freq: Four times a day (QID) | RECTAL | Status: DC | PRN

## 2021-11-18 MED ORDER — ROSUVASTATIN CALCIUM 20 MG PO TABS
40.0000 mg | ORAL_TABLET | Freq: Every day | ORAL | Status: DC
  Administered 2021-11-19 (×2): 40 mg via ORAL
  Filled 2021-11-18 (×3): qty 2

## 2021-11-18 MED ORDER — ALBUTEROL SULFATE (2.5 MG/3ML) 0.083% IN NEBU: 2.5000 mg | INHALATION_SOLUTION | Freq: Four times a day (QID) | RESPIRATORY_TRACT | Status: DC | PRN

## 2021-11-18 MED ORDER — MIRTAZAPINE 15 MG PO TABS
30.0000 mg | ORAL_TABLET | Freq: Every day | ORAL | Status: DC
  Administered 2021-11-19: 30 mg via ORAL
  Filled 2021-11-18: qty 2

## 2021-11-18 MED ORDER — ONDANSETRON HCL 4 MG PO TABS: 4.0000 mg | ORAL_TABLET | Freq: Four times a day (QID) | ORAL | Status: DC | PRN

## 2021-11-18 MED ORDER — MEMANTINE HCL 5 MG PO TABS
10.0000 mg | ORAL_TABLET | Freq: Two times a day (BID) | ORAL | Status: DC
  Administered 2021-11-19 (×2): 10 mg via ORAL
  Filled 2021-11-18 (×2): qty 2

## 2021-11-18 NOTE — Assessment & Plan Note (Signed)
-  With Tension headache, dizziness - Etiology work-up in progress - Patient had mild elevated D-dimer, 0.66 - Patient has history of anaphylactic reaction to IV contrast - VQ scan has been ordered by EDP - Nuclear medicine is not available at night - However I suspect that this is secondary to patient being taken off of propanolol on Tuesday, 11/12/2021 - Prior propanolol home dosing, 20 mg daily resumed, first dose on evening of admission - Admit to progressive cardiac, observation, telemetry

## 2021-11-18 NOTE — Assessment & Plan Note (Signed)
-   Continue heparin GTT - Continue V/Q order

## 2021-11-18 NOTE — ED Triage Notes (Signed)
Patient to ER via [facility] from [facility] independent living. Reports being taken off her propanolol on Tuesday at her pcp appointment.   Last night, patient started feeling dizzy, unsteady and is experiencing a 10/10 tension headache. Readings as high as 170/111. Also complaining of shortness of breath with exertion. Denies chest pain.

## 2021-11-18 NOTE — Hospital Course (Addendum)
[name] is a 76 year old female with history of anxiety, atrial fibrillation not on anticoagulation, history of stroke without residual deficits, irritable bowel disease, migraine headaches, history of intractable migraine with aura, Parkinson, chronic low back pain without sciatica, hyperlipidemia, who presents emergency department for chief concerns of shortness of breath worse with exertion.  Vitals in the emergency department show temperature of 98.3, respiration rate of 20 that increased to 32, heart rate of 136 and improved to 105, initial blood pressure 102/76, increased to 157/92, SPO2 94% on room air.  Labs in the emergency department showed serum sodium of 137, potassium 3.7, chloride 102, bicarb 26, BUN of 19, serum creatinine of 0.81, nonfasting blood glucose 189, GFR greater than 60.  WBC was 6.5, hemoglobin 14.8, platelets of 183.  BNP is 26.4.  High sensitive troponin was 14 and decreased to 11.  D-dimer was moderately elevated at 0.66.  TSH was 6.548.  And free T4 was within normal limits

## 2021-11-18 NOTE — Assessment & Plan Note (Addendum)
-   Resumed prior dosing of propranolol 20 mg daily - Labetalol 10 mg IV every 2 hours as needed for SBP greater than 170, 4 doses ordered

## 2021-11-18 NOTE — Assessment & Plan Note (Signed)
-   Resumed home rosuvastatin 40 mg daily

## 2021-11-18 NOTE — ED Provider Notes (Signed)
[facility] Provider Note    Event Date/Time   First MD Initiated Contact with Patient 11/18/21 1520     (approximate)   History   Headache and Hypertension   HPI  Caitlin Leonard is a 76 y.o. female with a history of chronic cough, obstructive sleep apnea, hypertension and recent illness with COVID that resolved less than a week ago who comes ED complaining of shortness of breath, worse with exertion for the past several days.  No cough.  No fever or chills.  She does have some lightheadedness with standing and notes that she has not been eating and drinking very well and feels very dehydrated.  Denies chest pain.  No lower extremity swelling or pain.  She also notes headache which been going on for the past several weeks related to her COVID illness along with some vision changes which are chronic and for which she is seeing a retina specialist.  No acute changes with these, no focal neurologic deficits or diplopia, no head trauma.  Recently had neuroimaging 3 weeks ago which was unremarkable.       Physical Exam   Triage Vital Signs: ED Triage Vitals  Enc Vitals Group     BP 11/18/21 1232 102/76     Pulse Rate 11/18/21 1232 (!) 136     Resp 11/18/21 1229 20     Temp 11/18/21 1229 98.3 F (36.8 C)     Temp Source 11/18/21 1229 Oral     SpO2 11/18/21 1232 94 %     Weight 11/18/21 1229 260 lb (117.9 kg)     Height 11/18/21 1229 5\' 8"  (1.727 m)     Head Circumference --      Peak Flow --      Pain Score 11/18/21 1229 10     Pain Loc --      Pain Edu? --      Excl. in [place]? --     Most recent vital signs: Vitals:   11/18/21 2000 11/18/21 2015  BP: (!) 157/92   Pulse: 98 (!) 103  Resp: (!) 28 (!) 25  Temp:    SpO2: 94% 94%     General: Awake, no distress.  CV:  Good peripheral perfusion.  Tachycardia heart rate 103. Resp:  Normal effort.  Tachypnea.  Lungs clear to auscultation bilaterally.  No wheezing Abd:  No distention.  Soft and  nontender. Other:  No lower extremity tenderness or calf swelling.  No inflammatory skin changes.   ED Results / Procedures / Treatments   Labs (all labs ordered are listed, but only abnormal results are displayed) Labs Reviewed  BASIC METABOLIC PANEL - Abnormal; Notable for the following components:      Result Value   Glucose, Bld 189 (*)    All other components within normal limits  CBC - Abnormal; Notable for the following components:   MCV 100.9 (*)    All other components within normal limits  TSH - Abnormal; Notable for the following components:   TSH 6.548 (*)    All other components within normal limits  D-DIMER, QUANTITATIVE - Abnormal; Notable for the following components:   D-Dimer, Quant 0.66 (*)    All other components within normal limits  T4, FREE  BRAIN NATRIURETIC PEPTIDE  HEPATIC FUNCTION PANEL  MAGNESIUM  PROTIME-INR  APTT  HEPARIN LEVEL (UNFRACTIONATED)  CBC  TROPONIN I (HIGH SENSITIVITY)  TROPONIN I (HIGH SENSITIVITY)     EKG  Interpreted by  me Sinus tachycardia rate 137.  Normal axis, normal intervals.  Normal QRS ST segments and T waves.   RADIOLOGY Chest x-ray viewed and interpreted by me, appears normal.  Radiology report reviewed    PROCEDURES:  Critical Care performed: No  .1-3 Lead EKG Interpretation Performed by: Carrie Mew, MD Authorized by: Carrie Mew, MD     Interpretation: abnormal     ECG rate:  103   ECG rate assessment: tachycardic     Rhythm: sinus rhythm     Ectopy: none     Conduction: normal   Comments:        .Critical Care Performed by: Carrie Mew, MD Authorized by: Carrie Mew, MD   Critical care provider statement:    Critical care time (minutes):  35   Critical care time was exclusive of:  Separately billable procedures and treating other patients   Critical care was necessary to treat or prevent imminent or life-threatening deterioration of the following conditions:   Circulatory failure and cardiac failure   Critical care was time spent personally by me on the following activities:  Development of treatment plan with patient or surrogate, discussions with consultants, evaluation of patient's response to treatment, examination of patient, obtaining history from patient or surrogate, ordering and performing treatments and interventions, ordering and review of laboratory studies, ordering and review of radiographic studies, pulse oximetry, re-evaluation of patient's condition and review of old charts   MEDICATIONS ORDERED IN ED: Medications  heparin bolus via infusion 5,400 Units (has no administration in time range)  heparin ADULT infusion 100 units/mL (25000 units/245mL) (has no administration in time range)  lactated ringers bolus 1,000 mL (0 mLs Intravenous Stopped 11/18/21 1800)     IMPRESSION / MDM / [place] / ED COURSE  I reviewed the triage vital signs and the nursing notes.                              Differential diagnosis includes, but is not limited to, idiopathic sinus tachycardia, dehydration, pulmonary embolism, post-COVID syndrome, hypothyroidism  The patient is on the cardiac monitor to evaluate for evidence of arrhythmia and/or significant heart rate changes.**}  Patient presents with tachypnea, tachycardia, shortness of breath after recent COVID illness.  This may be related to the discontinuation of her propranolol beta-blocker as well as clinically apparent dehydration.  However, she is at elevated risk for pulmonary embolism, so I will obtain a VQ scan given her anaphylactic reaction to IV contrast in the past.  We will give IV fluids in the meantime for hydration.  Clinical Course as of 11/18/21 2207  Mon Nov 18, 2021  2108 D-dimer elevated.  CT chest without contrast unremarkable, no explanation for her symptoms.  We will contact [facility] to attempt to coordinate VQ scan at [facility] [PS]  [address] nuclear  medicine tech has told our staff that they are closed for the night as well.  Unable to obtain V/Q tonight.  With current presentation, persistent tachycardia shortness of breath, highly suspicious for PE.  We will need to start on heparin now and admit.  Case discussed with radiology supervisor who advises that V/Q cannot be done tonight but can be done in the morning. [PS]    Clinical Course User Index [PS] Carrie Mew, MD     FINAL CLINICAL IMPRESSION(S) / ED DIAGNOSES   Final diagnoses:  SOB (shortness of breath)  Suspected pulmonary embolism  Rx / DC Orders   ED Discharge Orders     None        Note:  This document was prepared using Dragon voice recognition software and may include unintentional dictation errors.   Carrie Mew, MD 11/18/21 2208

## 2021-11-18 NOTE — H&P (Signed)
History and Physical   [name] XFG:[id number] DOB: 11-25-1945 DOA: 11/18/2021  PCP: Maryland Pink, MD  Outpatient Specialists: Dr. Loni Muse, GI provider Patient coming from: [facility] independent living via EMS  I have personally briefly reviewed patient's old medical records in [place].  Chief Concern: Dizzy, headache  HPI: [name] is a 76 year old female with history of anxiety, atrial fibrillation not on anticoagulation, history of stroke without residual deficits, irritable bowel disease, migraine headaches, history of intractable migraine with aura, Parkinson, chronic low back pain without sciatica, hyperlipidemia, who presents emergency department for chief concerns of shortness of breath worse with exertion.  Vitals in the emergency department show temperature of 98.3, respiration rate of 20 that increased to 32, heart rate of 136 and improved to 105, initial blood pressure 102/76, increased to 157/92, SPO2 94% on room air.  Labs in the emergency department showed serum sodium of 137, potassium 3.7, chloride 102, bicarb 26, BUN of 19, serum creatinine of 0.81, nonfasting blood glucose 189, GFR greater than 60.  WBC was 6.5, hemoglobin 14.8, platelets of 183.  BNP is 26.4.  High sensitive troponin was 14 and decreased to 11.  D-dimer was moderately elevated at 0.66.  TSH was 6.548.  And free T4 was within normal limits  At bedside, she was able to tell me her name, age, current calender year, current president, current month.   She reports coming to the hospital due to elevated BP of 180/160 and her rate was high but she was not able to remember what the rate was.   She endorses associated shortness of breath. She denies chest pain, abdominal pain, nausea, vomiting, diarrhea, dsyuria, hematuria. She endorses onset of migraine with aura.   She reports that at baseline she walks with a rollator walker.  Social history: She denies history of tobacco,  recreational drug use. She infrequently drinks etoh, one glass on Christmas. She is retired and formerly worked as a Pharmacist, hospital.   Vaccination history: She is vaccinated for covid 19, and influenza. She had covid in 2023 in January.   ROS: Constitutional: no weight change, no fever ENT/Mouth: no sore throat, no rhinorrhea Eyes: no eye pain, no vision changes Cardiovascular: no chest pain, + dyspnea,  no edema, no palpitations Respiratory: no cough, no sputum, no wheezing Gastrointestinal: no nausea, no vomiting, no diarrhea, no constipation Genitourinary: no urinary incontinence, no dysuria, no hematuria Musculoskeletal: no arthralgias, no myalgias Skin: no skin lesions, no pruritus, Neuro: + weakness, no loss of consciousness, no syncope, + dizziness Psych: no anxiety, no depression, + decrease appetite Heme/Lymph: no bruising, no bleeding  ED Course: Discussed with emergency medicine provider, patient requiring hospitalization for chief concerns of PE.  Assessment/Plan  Principal Problem:   Shortness of breath Active Problems:   OSA (obstructive sleep apnea)   Obesity   Essential hypertension   Depression with anxiety   COPD (chronic obstructive pulmonary disease) (HCC)   Hyperlipemia   Atrial fibrillation (HCC)   Stroke (HCC)   Parkinson's disease (HCC)   Positive D dimer   * Shortness of breath Assessment & Plan -With Tension headache, dizziness - Etiology work-up in progress - Patient had mild elevated D-dimer, 0.66 - Patient has history of anaphylactic reaction to IV contrast - VQ scan has been ordered by EDP - Nuclear medicine is not available at night - However I suspect that this is secondary to patient being taken off of propanolol on Tuesday, 11/12/2021 - Prior propanolol home dosing,  20 mg daily resumed, first dose on evening of admission - Admit to progressive cardiac, observation, telemetry  Essential hypertension Assessment & Plan - Resumed prior dosing of  propranolol 20 mg daily - Labetalol 10 mg IV every 2 hours as needed for SBP greater than 170, 4 doses ordered  Positive D dimer Assessment & Plan - Continue heparin GTT - Continue V/Q order  Hyperlipemia Assessment & Plan - Resumed home rosuvastatin 40 mg daily  Depression with anxiety Assessment & Plan - Resumed home buspirone 10 mg twice daily, sertraline 50 mg nightly - Mirtazapine 30 mg nightly, home alprazolam 0.5 mg p.o. daily nightly  Chart reviewed.   DVT prophylaxis: Heparin GTT Code Status: Full code Diet: Heart healthy Family Communication: No Disposition Plan: Pending clinical course Consults called: None at this time Admission status: Progressive cardiac, observation, telemetry  Past Medical History:  Diagnosis Date   Anemia    Anxiety    Arthritis    osteoarthritis   Asthma    due to seasonal alllergies   Atrial fibrillation ([place])    Carpal tunnel syndrome    Cataract    Cervicalgia    Collagenous colitis 2010   COPD (chronic obstructive pulmonary disease) (HCC)    Depression    situational   Diabetes mellitus without complication (HCC)    Dysrhythmia    GERD (gastroesophageal reflux disease)    GI bleed    Headache(784.0)    migraines; 2 x month   Heart murmur    MVP ( symptomatic)   History of IBS    Hyperlipemia    Hypertension    on medication x 10 years   Migraine    MVP (mitral valve prolapse)    Shortness of breath    exertional   Sinoatrial node dysfunction ([place])    Sleep apnea 2007   does not use CPAP since 40 # wt loss   Stroke [facility])    Syncopal episodes    Past Surgical History:  Procedure Laterality Date   ABDOMINAL HYSTERECTOMY  1979   BACK SURGERY     BREAST CYST ASPIRATION Right 25 plus yrs ago   CARDIAC CATHETERIZATION  2012   [place]  2011   COLONOSCOPY WITH PROPOFOL N/A 03/25/2019   Procedure: COLONOSCOPY WITH PROPOFOL;  Surgeon: Lollie Sails, MD;  Location[facility] ENDOSCOPY;  Service:  Endoscopy;  Laterality: N/A;   COLONOSCOPY WITH PROPOFOL N/A 11/29/2020   Procedure: COLONOSCOPY WITH PROPOFOL;  Surgeon: Lesly Rubenstein, MD;  Location: [facility] ENDOSCOPY;  Service: Endoscopy;  Laterality: N/A;   ESOPHAGOGASTRODUODENOSCOPY (EGD) WITH PROPOFOL N/A 03/25/2019   Procedure: ESOPHAGOGASTRODUODENOSCOPY (EGD) WITH PROPOFOL;  Surgeon: Lollie Sails, MD;  Location: [facility] ENDOSCOPY;  Service: Endoscopy;  Laterality: N/A;   ESOPHAGOGASTRODUODENOSCOPY (EGD) WITH PROPOFOL N/A 11/29/2020   Procedure: ESOPHAGOGASTRODUODENOSCOPY (EGD) WITH PROPOFOL;  Surgeon: Lesly Rubenstein, MD;  Location: [facility] ENDOSCOPY;  Service: Endoscopy;  Laterality: N/A;   FLEXIBLE BRONCHOSCOPY N/A 10/10/2016   Procedure: FLEXIBLE BRONCHOSCOPY;  Surgeon: [name], MD;  Location: [facility] ORS;  Service: Pulmonary;  Laterality: N/A;   FLEXIBLE BRONCHOSCOPY N/A 12/11/2017   Procedure: FLEXIBLE BRONCHOSCOPY;  Surgeon: [name], MD;  Location: [facility] ORS;  Service: Pulmonary;  Laterality: N/A;   JOINT REPLACEMENT     KNEE ARTHROSCOPY  1985, 1990, 2012 x 2   bilateral   LUMBAR LAMINECTOMY  1025,8527   NASAL SINUS SURGERY  1994   TONSILLECTOMY     TOTAL KNEE ARTHROPLASTY  12/22/2011  Procedure: TOTAL KNEE ARTHROPLASTY;  Surgeon: Rudean Haskell, MD;  Location: [place];  Service: Orthopedics;  Laterality: Left;   Social History:  reports that she has never smoked. She has never used smokeless tobacco. She reports current alcohol use of about 1.0 standard drink per week. She reports that she does not use drugs.  Allergies  Allergen Reactions   Contrast Media [Iodinated Contrast Media] Anaphylaxis   Gadolinium Derivatives Anaphylaxis   Gadoversetamide Anaphylaxis   Shellfish Allergy Nausea And Vomiting and Other (See Comments)    Can eat shrimp but not oysters Dizziness,severe nausea and vomiting ( can eat shrimp CAN NOT EAT ORYSTERS) Other reaction(s): Other (See Comments) Dizziness,severe nausea and vomiting  ( can eat shrimp CAN NOT EAT ORYSTERS)  Can eat shrimp but not oysters Dizziness,severe nausea and vomiting ( can eat shrimp CAN NOT EAT ORYSTERS)  Can eat shrimp but not oysters    Sulfa Antibiotics Hives and Other (See Comments)    GI upset GI upset GI upset  GI upset GI upset GI upset Other reaction(s): Other (See Comments) GI upset GI upset  GI upset GI upset    Hydroxychloroquine Sulfate Hives and Other (See Comments)    Severe hives, all over like chicken pox.   Banana Nausea And Vomiting and Other (See Comments)    Raw bananas   Limonene Other (See Comments)    GI upset GI upset    Morphine And Related    Nabumetone Other (See Comments)    Hypertension   Otezla [Apremilast]    Potassium-Containing Compounds Other (See Comments)    GI upset   Prednisone Nausea And Vomiting   Sulfasalazine Hives and Other (See Comments)    GI upset   Sulfonamide Derivatives Hives   Metronidazole Rash   Sulfamethoxazole Rash   Family History  Problem Relation Age of Onset   Hypertension Mother    Hyperlipidemia Mother    Diabetes Mother    Hypertension Father    Hyperlipidemia Father    Arrhythmia Father        A-Fib   Diabetes Paternal Uncle    Diabetes Paternal Grandfather    Breast cancer Maternal Aunt        great in late 32's   Anesthesia problems Neg Hx    Colon cancer Neg Hx    Stomach cancer Neg Hx    Rectal cancer Neg Hx    Liver cancer Neg Hx    Esophageal cancer Neg Hx    Family history: Family history reviewed and not pertinent.  Prior to Admission medications   Medication Sig Start Date End Date Taking? Authorizing Provider  ALPRAZolam Duanne Moron) 0.5 MG tablet Take 1 tablet (0.5 mg total) by mouth at bedtime. 02/22/21  Yes White, Aaron Edelman A, NP  budesonide (ENTOCORT EC) 3 MG 24 hr capsule Take 9 mg by mouth daily. 03/29/20  Yes [provider]  busPIRone (BUSPAR) 10 MG tablet Take 1 tablet (10 mg total) by mouth 3 (three) times daily. Patient  taking differently: Take 10 mg by mouth 2 (two) times daily. 02/22/21  Yes White, Aaron Edelman A, NP  carbidopa-levodopa (SINEMET IR) 25-100 MG tablet Take 2 tablets by mouth 3 (three) times daily. 10/18/20 11/18/21 Yes [provider]  carboxymethylcellulose (REFRESH PLUS) 0.5 % SOLN Place 1 drop into both eyes 2 (two) times daily.   Yes [provider]  cetirizine (ZYRTEC) 10 MG tablet TAKE 1 TABLET(10 MG) BY MOUTH AT BEDTIME 01/20/20  Yes Tyler Pita,  MD  memantine (NAMENDA) 10 MG tablet Take 10 mg by mouth 2 (two) times daily. 10/10/20  Yes [provider]  metoprolol tartrate (LOPRESSOR) 50 MG tablet TAKE 1 TABLET(50 MG) BY MOUTH TWICE DAILY Patient taking differently: Take 50 mg by mouth 2 (two) times daily. 11/24/18  Yes [name], MD  mirtazapine (REMERON) 30 MG tablet Take 30 mg by mouth at bedtime. 11/03/21  Yes [provider]  omeprazole (PRILOSEC) 40 MG capsule TAKE 1 CAPSULE BY MOUTH DAILY AT DINNER TIME Patient taking differently: Take 40 mg by mouth daily. 05/30/19  Yes [name], MD  rosuvastatin (CRESTOR) 40 MG tablet Take 1 tablet (40 mg total) by mouth daily. 11/11/16 11/18/21 Yes Gollan, Kathlene November, MD  sertraline (ZOLOFT) 100 MG tablet Take 2 tablets (200 mg total) by mouth daily. Patient taking differently: Take 50 mg by mouth at bedtime. 02/22/21  Yes White, Louanna Raw, NP  albuterol (VENTOLIN HFA) 108 (90 Base) MCG/ACT inhaler Inhale 2 puffs into the lungs every 6 (six) hours as needed for wheezing or shortness of breath. Patient not taking: Reported on 11/18/2021 11/02/19   Tyler Pita, MD  aspirin EC 81 MG tablet Take 81 mg by mouth daily. Patient not taking: Reported on 11/18/2021    [provider]  diltiazem (CARDIZEM CD) 180 MG 24 hr capsule Take 180 mg by mouth daily. 10/01/20   [provider]  EPIPEN 2-PAK 0.3 MG/0.3ML SOAJ injection Inject 0.3 mg into the muscle once.     [provider]  furosemide  (LASIX) 20 MG tablet Take 20 mg by mouth daily. Patient not taking: Reported on 11/18/2021 11/10/20   [provider]  hyoscyamine (LEVSIN) 0.125 MG tablet Take 0.125 mg by mouth every 4 (four) hours as needed.    [provider]  loperamide (IMODIUM) 2 MG capsule Take 1 capsule (2 mg total) by mouth every 6 (six) hours as needed for diarrhea or loose stools. 05/04/19   Sudini, Srikar, MD  NURTEC 75 MG TBDP Take 75 mg by mouth daily as needed for migraine. 11/16/21   [provider]  Vitamin D, Ergocalciferol, (DRISDOL) 1.25 MG (50000 UNIT) CAPS capsule Take 50,000 Units by mouth once a week. Patient not taking: Reported on 11/18/2021 11/02/20   [provider]   Physical Exam: Vitals:   11/18/21 2218 11/18/21 2245 11/18/21 2300 11/18/21 2338  BP: (!) 159/101  (!) 159/94 134/90  Pulse: (!) 102 (!) 107 (!) 104 100  Resp: (!) 33 (!) 30 (!) 31 18  Temp:    98.3 F (36.8 C)  TempSrc:      SpO2: 95% 93% 92% 94%  Weight:      Height:       Constitutional: appears age-appropriate, frail, NAD, calm, comfortable Eyes: PERRL, lids and conjunctivae normal ENMT: Mucous membranes are moist. Posterior pharynx clear of any exudate or lesions. Age-appropriate dentition. Hearing appropriate Neck: normal, supple, no masses, no thyromegaly Respiratory: clear to auscultation bilaterally, no wheezing, no crackles. Normal respiratory effort. No accessory muscle use.  Cardiovascular: Regular rate and rhythm, no murmurs / rubs / gallops. No extremity edema. 2+ pedal pulses. No carotid bruits.  Abdomen: Morbidly obese abdomen, no tenderness, no masses palpated, no hepatosplenomegaly. Bowel sounds positive.  Musculoskeletal: no clubbing / cyanosis. No joint deformity upper and lower extremities. Good ROM, no contractures, no atrophy. Normal muscle tone.  Skin: no rashes, lesions, ulcers. No induration Neurologic: Sensation intact. Strength 5/5 in all 4.  Psychiatric: Normal judgment  and insight. Alert and oriented x 3. Normal mood.   EKG: independently reviewed, showing   Chest x-ray on Admission: I personally reviewed and I agree with radiologist reading as below.  DG Chest 2 View  Result Date: 11/18/2021 CLINICAL DATA:  Patient to ER via [facility] from [facility] independent living. Reports being taken off her propanolol on Tuesday at her pcp appointment. Last night, patient started feeling dizzy, unsteady EXAM: CHEST - 2 VIEW COMPARISON:  04/30/2019 FINDINGS: Relatively low lung volumes. Coarse interstitial opacities at the lung bases. No confluent airspace disease. Heart size and mediastinal contours are within normal limits. Aortic Atherosclerosis (ICD10-170.0). No effusion.  No pneumothorax. Visualized bones unremarkable. IMPRESSION: Low volumes.  No acute cardiopulmonary disease. Electronically Signed   By: Lucrezia Europe M.D.   On: 11/18/2021 13:02   CT CHEST WO CONTRAST  Result Date: 11/18/2021 CLINICAL DATA:  Dizziness, headache, short of breath, dyspnea on exertion EXAM: CT CHEST WITHOUT CONTRAST TECHNIQUE: Multidetector CT imaging of the chest was performed following the standard protocol without IV contrast. RADIATION DOSE REDUCTION: This exam was performed according to the departmental dose-optimization program which includes automated exposure control, adjustment of the mA and/or kV according to patient size and/or use of iterative reconstruction technique. COMPARISON:  11/18/2021, 03/13/2020 FINDINGS: Cardiovascular: Limited unenhanced imaging of the heart and great vessels demonstrates no pericardial effusion. 4.1 cm ascending thoracic aortic aneurysm unchanged. Evaluation of the vascular lumen is limited without IV contrast. Stable atherosclerosis of the aorta and coronary vasculature. Mediastinum/Nodes: No enlarged mediastinal or axillary lymph nodes. Thyroid gland, trachea, and esophagus demonstrate no significant findings. Lungs/Pleura: Scattered ground-glass opacities  within the upper lobes are again noted, slightly improved since prior study, favor chronic bronchiolitis and small airway disease. Dependent hypoventilatory changes are seen within the lungs, with persistent basilar predominant subpleural scarring again noted. No effusion or pneumothorax. The central airways are patent. Upper Abdomen: No acute abnormality. Musculoskeletal: No acute or destructive bony lesions. Reconstructed images demonstrate no additional findings. IMPRESSION: 1. No acute intrathoracic process. Bibasilar hypoventilatory changes. 2. Continued basilar predominant interstitial lung disease, with slight progression of subpleural scarring in a pattern suggesting UIP. 3. Persistent but improved upper lobe predominant ground-glass opacities consistent with chronic bronchiolitis and small airway disease. 4. Stable 4.1 cm ascending thoracic aortic aneurysm. Recommend annual imaging followup by CTA or MRA. This recommendation follows 2010 ACCF/AHA/AATS/ACR/ASA/SCA/SCAI/SIR/STS/SVM Guidelines for the Diagnosis and Management of Patients with Thoracic Aortic Disease. Circulation. 2010; 121: W960-A540. Aortic aneurysm NOS (ICD10-I71.9) 5. Aortic Atherosclerosis (ICD10-I70.0). Coronary artery atherosclerosis. Electronically Signed   By: Randa Ngo M.D.   On: 11/18/2021 20:46    Labs on Admission: I have personally reviewed following labs  CBC: Recent Labs  Lab 11/18/21 1232  WBC 6.5  HGB 14.8  HCT 45.3  MCV 100.9*  PLT 981   Basic Metabolic Panel: Recent Labs  Lab 11/18/21 1232  NA 137  K 3.7  CL 102  CO2 26  GLUCOSE 189*  BUN 19  CREATININE 0.81  CALCIUM 10.0  MG 2.0   GFR: Estimated Creatinine Clearance: 83.5 mL/min (by C-G formula based on SCr of 0.81 mg/dL).  Liver Function Tests: Recent Labs  Lab 11/18/21 1232  AST 23  ALT 12  ALKPHOS 67  BILITOT 1.1  PROT 7.1  ALBUMIN 3.9   Thyroid Function Tests: Recent Labs    11/18/21 1232  TSH 6.548*  FREET4 0.86    Anemia Panel: No results for input(s): VITAMINB12, FOLATE,  FERRITIN, TIBC, IRON, RETICCTPCT in the last 72 hours.  Urine analysis:    Component Value Date/Time   COLORURINE AMBER (A) 04/30/2019 0615   APPEARANCEUR HAZY (A) 04/30/2019 0615   LABSPEC 1.021 04/30/2019 0615   PHURINE 5.0 04/30/2019 0615   GLUCOSEU NEGATIVE 04/30/2019 0615   HGBUR NEGATIVE 04/30/2019 0615   BILIRUBINUR NEGATIVE 04/30/2019 0615   KETONESUR NEGATIVE 04/30/2019 0615   PROTEINUR 30 (A) 04/30/2019 0615   UROBILINOGEN 1.0 12/11/2011 1236   NITRITE NEGATIVE 04/30/2019 0615   LEUKOCYTESUR NEGATIVE 04/30/2019 0615   Dr. Tobie Poet [facility]  If 7PM-7AM, please contact overnight-coverage provider If 7AM-7PM, please contact day coverage provider [email or web address]  11/18/2021, 11:47 PM

## 2021-11-18 NOTE — Consult Note (Signed)
ANTICOAGULATION CONSULT NOTE - Initial Consult  Pharmacy Consult for heparin Indication: pulmonary embolus  Allergies  Allergen Reactions   Contrast Media [Iodinated Contrast Media] Anaphylaxis   Gadolinium Derivatives Anaphylaxis   Gadoversetamide Anaphylaxis   Shellfish Allergy Nausea And Vomiting and Other (See Comments)    Can eat shrimp but not oysters Dizziness,severe nausea and vomiting ( can eat shrimp CAN NOT EAT ORYSTERS) Other reaction(s): Other (See Comments) Dizziness,severe nausea and vomiting ( can eat shrimp CAN NOT EAT ORYSTERS)  Can eat shrimp but not oysters Dizziness,severe nausea and vomiting ( can eat shrimp CAN NOT EAT ORYSTERS)  Can eat shrimp but not oysters    Sulfa Antibiotics Hives and Other (See Comments)    GI upset GI upset GI upset  GI upset GI upset GI upset Other reaction(s): Other (See Comments) GI upset GI upset  GI upset GI upset    Hydroxychloroquine Sulfate Hives and Other (See Comments)    Severe hives, all over like chicken pox.   Banana Nausea And Vomiting and Other (See Comments)    Raw bananas   Limonene Other (See Comments)    GI upset GI upset    Morphine And Related    Nabumetone Other (See Comments)    Hypertension   Otezla [Apremilast]    Potassium-Containing Compounds Other (See Comments)    GI upset   Prednisone Nausea And Vomiting   Sulfasalazine Hives and Other (See Comments)    GI upset   Sulfonamide Derivatives Hives   Metronidazole Rash   Sulfamethoxazole Rash    Patient Measurements: Height: 5\' 8"  (172.7 cm) Weight: 117.9 kg (260 lb) IBW/kg (Calculated) : 63.9 Heparin Dosing Weight: 91.3 kg  Vital Signs: Temp: 98.3 F (36.8 C) (02/13 1229) Temp Source: Oral (02/13 1229) BP: 157/92 (02/13 2000) Pulse Rate: 103 (02/13 2015)  Labs: Recent Labs    11/18/21 1232 11/18/21 1431  HGB 14.8  --   HCT 45.3  --   PLT 183  --   CREATININE 0.81  --   TROPONINIHS 14 11    Estimated Creatinine  Clearance: 83.5 mL/min (by C-G formula based on SCr of 0.81 mg/dL).   Medical History: Past Medical History:  Diagnosis Date   Anemia    Anxiety    Arthritis    osteoarthritis   Asthma    due to seasonal alllergies   Atrial fibrillation (HCC)    Carpal tunnel syndrome    Cataract    Cervicalgia    Collagenous colitis 2010   COPD (chronic obstructive pulmonary disease) (HCC)    Depression    situational   Diabetes mellitus without complication (HCC)    Dysrhythmia    GERD (gastroesophageal reflux disease)    GI bleed    Headache(784.0)    migraines; 2 x month   Heart murmur    MVP ( symptomatic)   History of IBS    Hyperlipemia    Hypertension    on medication x 10 years   Migraine    MVP (mitral valve prolapse)    Shortness of breath    exertional   Sinoatrial node dysfunction ([place])    Sleep apnea 2007   does not use CPAP since 40 # wt loss   Stroke ([place])    Syncopal episodes     Medications:  (Not in a hospital admission)  Scheduled:  Infusions:  PRN:  Anti-infectives (From admission, onward)    None       Assessment: Pharmacy consulted to start  heparin for a PE. No DOAC PTA. Baseline labs ordered.   Goal of Therapy:  Heparin level 0.3-0.7 units/ml Monitor platelets by anticoagulation protocol: Yes   Plan:  Give 5400 units bolus x 1 Start heparin infusion at 1450 units/hr Check anti-Xa level in 8 hours and daily while on heparin Continue to monitor H&H and platelets  Oswald Hillock, PharmD, BCPS 11/18/2021,9:49 PM

## 2021-11-18 NOTE — Assessment & Plan Note (Signed)
-   Resumed home buspirone 10 mg twice daily, sertraline 50 mg nightly - Mirtazapine 30 mg nightly, home alprazolam 0.5 mg p.o. daily nightly

## 2021-11-19 ENCOUNTER — Encounter: Payer: Self-pay | Admitting: Internal Medicine

## 2021-11-19 ENCOUNTER — Observation Stay: Payer: Medicare PPO

## 2021-11-19 DIAGNOSIS — R0602 Shortness of breath: Secondary | ICD-10-CM

## 2021-11-19 LAB — CBC
HCT: 39.9 % (ref 36.0–46.0)
Hemoglobin: 12.8 g/dL (ref 12.0–15.0)
MCH: 32.7 pg (ref 26.0–34.0)
MCHC: 32.1 g/dL (ref 30.0–36.0)
MCV: 102 fL — ABNORMAL HIGH (ref 80.0–100.0)
Platelets: 130 10*3/uL — ABNORMAL LOW (ref 150–400)
RBC: 3.91 MIL/uL (ref 3.87–5.11)
RDW: 15.7 % — ABNORMAL HIGH (ref 11.5–15.5)
WBC: 4.8 10*3/uL (ref 4.0–10.5)
nRBC: 0 % (ref 0.0–0.2)

## 2021-11-19 LAB — BASIC METABOLIC PANEL
Anion gap: 9 (ref 5–15)
BUN: 15 mg/dL (ref 8–23)
CO2: 24 mmol/L (ref 22–32)
Calcium: 9.3 mg/dL (ref 8.9–10.3)
Chloride: 107 mmol/L (ref 98–111)
Creatinine, Ser: 0.47 mg/dL (ref 0.44–1.00)
GFR, Estimated: >60 mL/min (ref >60)
Glucose, Bld: 134 mg/dL — ABNORMAL HIGH (ref 70–99)
Potassium: 3.1 mmol/L — ABNORMAL LOW (ref 3.5–5.1)
Sodium: 140 mmol/L (ref 135–145)

## 2021-11-19 LAB — PROTIME-INR
INR: 1.1 (ref 0.8–1.2)
Prothrombin Time: 13.8 seconds (ref 11.4–15.2)

## 2021-11-19 LAB — HEPARIN LEVEL (UNFRACTIONATED): Heparin Unfractionated: 0.98 IU/mL — ABNORMAL HIGH (ref 0.30–0.70)

## 2021-11-19 LAB — APTT: aPTT: >200 seconds (ref 24–36)

## 2021-11-19 MED ORDER — POTASSIUM CHLORIDE CRYS ER 20 MEQ PO TBCR: 40.0000 meq | EXTENDED_RELEASE_TABLET | Freq: Once | ORAL | Status: DC

## 2021-11-19 MED ORDER — SERTRALINE HCL 100 MG PO TABS: 50.0000 mg | ORAL_TABLET | Freq: Every day | ORAL | Status: DC

## 2021-11-19 MED ORDER — LISINOPRIL 5 MG PO TABS: 5.0000 mg | ORAL_TABLET | Freq: Every day | ORAL | Status: DC

## 2021-11-19 MED ORDER — BUSPIRONE HCL 10 MG PO TABS: 10.0000 mg | ORAL_TABLET | Freq: Two times a day (BID) | ORAL | Status: DC

## 2021-11-19 MED ORDER — TECHNETIUM TO 99M ALBUMIN AGGREGATED
4.0000 | Freq: Once | INTRAVENOUS | Status: AC | PRN
  Administered 2021-11-19: 4.58 via INTRAVENOUS

## 2021-11-19 MED ORDER — POTASSIUM CHLORIDE 20 MEQ PO PACK
40.0000 meq | PACK | Freq: Once | ORAL | Status: AC
  Administered 2021-11-19: 40 meq via ORAL
  Filled 2021-11-19: qty 2

## 2021-11-19 NOTE — Consult Note (Signed)
ANTICOAGULATION CONSULT NOTE - Initial Consult  Pharmacy Consult for heparin Indication: pulmonary embolus  Allergies  Allergen Reactions   Contrast Media [Iodinated Contrast Media] Anaphylaxis   Gadolinium Derivatives Anaphylaxis   Gadoversetamide Anaphylaxis   Shellfish Allergy Nausea And Vomiting and Other (See Comments)    Can eat shrimp but not oysters Dizziness,severe nausea and vomiting ( can eat shrimp CAN NOT EAT ORYSTERS) Other reaction(s): Other (See Comments) Dizziness,severe nausea and vomiting ( can eat shrimp CAN NOT EAT ORYSTERS)  Can eat shrimp but not oysters Dizziness,severe nausea and vomiting ( can eat shrimp CAN NOT EAT ORYSTERS)  Can eat shrimp but not oysters    Sulfa Antibiotics Hives and Other (See Comments)    GI upset GI upset GI upset  GI upset GI upset GI upset Other reaction(s): Other (See Comments) GI upset GI upset  GI upset GI upset    Hydroxychloroquine Sulfate Hives and Other (See Comments)    Severe hives, all over like chicken pox.   Banana Nausea And Vomiting and Other (See Comments)    Raw bananas   Limonene Other (See Comments)    GI upset GI upset    Morphine And Related    Nabumetone Other (See Comments)    Hypertension   Otezla [Apremilast]    Potassium-Containing Compounds Other (See Comments)    GI upset   Prednisone Nausea And Vomiting   Sulfasalazine Hives and Other (See Comments)    GI upset   Sulfonamide Derivatives Hives   Metronidazole Rash   Sulfamethoxazole Rash    Patient Measurements: Height: 5\' 8"  (172.7 cm) Weight: 117.9 kg (260 lb) IBW/kg (Calculated) : 63.9 Heparin Dosing Weight: 91.3 kg  Vital Signs: Temp: 98.2 F (36.8 C) (02/14 0439) Temp Source: Oral (02/14 0439) BP: 124/59 (02/14 0439) Pulse Rate: 93 (02/14 0439)  Labs: Recent Labs    11/18/21 1232 11/18/21 1431 11/18/21 2338 11/19/21 0553  HGB 14.8  --   --  12.8  HCT 45.3  --   --  39.9  PLT 183  --   --  130*  APTT  --    --  >200*  --   LABPROT  --   --  13.8  --   INR  --   --  1.1  --   HEPARINUNFRC  --   --   --  0.98*  CREATININE 0.81  --   --  0.47  TROPONINIHS 14 11  --   --      Estimated Creatinine Clearance: 84.5 mL/min (by C-G formula based on SCr of 0.47 mg/dL).   Medical History: Past Medical History:  Diagnosis Date   Anemia    Anxiety    Arthritis    osteoarthritis   Asthma    due to seasonal alllergies   Atrial fibrillation (HCC)    Carpal tunnel syndrome    Cataract    Cervicalgia    Collagenous colitis 2010   COPD (chronic obstructive pulmonary disease) (HCC)    Depression    situational   Diabetes mellitus without complication (HCC)    Dysrhythmia    GERD (gastroesophageal reflux disease)    GI bleed    Headache(784.0)    migraines; 2 x month   Heart murmur    MVP ( symptomatic)   History of IBS    Hyperlipemia    Hypertension    on medication x 10 years   Migraine    MVP (mitral valve prolapse)    Shortness  of breath    exertional   Sinoatrial node dysfunction (HCC)    Sleep apnea 2007   does not use CPAP since 40 # wt loss   Stroke [facility])    Syncopal episodes     Medications:  Medications Prior to Admission  Medication Sig Dispense Refill Last Dose   ALPRAZolam (XANAX) 0.5 MG tablet Take 1 tablet (0.5 mg total) by mouth at bedtime. 30 tablet 1 11/17/2021 at 2000   budesonide (ENTOCORT EC) 3 MG 24 hr capsule Take 9 mg by mouth daily.   11/17/2021 at 0800   busPIRone (BUSPAR) 10 MG tablet Take 1 tablet (10 mg total) by mouth 3 (three) times daily. (Patient taking differently: Take 10 mg by mouth 2 (two) times daily.) 90 tablet 3 11/17/2021   carbidopa-levodopa (SINEMET IR) 25-100 MG tablet Take 2 tablets by mouth 3 (three) times daily.   Past Week   carboxymethylcellulose (REFRESH PLUS) 0.5 % SOLN Place 1 drop into both eyes 2 (two) times daily.   11/17/2021   cetirizine (ZYRTEC) 10 MG tablet TAKE 1 TABLET(10 MG) BY MOUTH AT BEDTIME 30 tablet 2 11/17/2021 at  2000   memantine (NAMENDA) 10 MG tablet Take 10 mg by mouth 2 (two) times daily.   11/17/2021 at 2000   metoprolol tartrate (LOPRESSOR) 50 MG tablet TAKE 1 TABLET(50 MG) BY MOUTH TWICE DAILY (Patient taking differently: Take 50 mg by mouth 2 (two) times daily.) 60 tablet 11 11/17/2021 at 1700   mirtazapine (REMERON) 30 MG tablet Take 30 mg by mouth at bedtime.   11/17/2021 at 2000   omeprazole (PRILOSEC) 40 MG capsule TAKE 1 CAPSULE BY MOUTH DAILY AT DINNER TIME (Patient taking differently: Take 40 mg by mouth daily.) 180 capsule 0 11/17/2021 at 1700   rosuvastatin (CRESTOR) 40 MG tablet Take 1 tablet (40 mg total) by mouth daily. 90 tablet 3 11/17/2021 at 2000   sertraline (ZOLOFT) 100 MG tablet Take 2 tablets (200 mg total) by mouth daily. (Patient taking differently: Take 50 mg by mouth at bedtime.) 60 tablet 3 11/17/2021 at 2000   albuterol (VENTOLIN HFA) 108 (90 Base) MCG/ACT inhaler Inhale 2 puffs into the lungs every 6 (six) hours as needed for wheezing or shortness of breath. (Patient not taking: Reported on 11/18/2021) 18 g 6 Not Taking   aspirin EC 81 MG tablet Take 81 mg by mouth daily. (Patient not taking: Reported on 11/18/2021)   Not Taking   diltiazem (CARDIZEM CD) 180 MG 24 hr capsule Take 180 mg by mouth daily.      EPIPEN 2-PAK 0.3 MG/0.3ML SOAJ injection Inject 0.3 mg into the muscle once.    prn at prn   furosemide (LASIX) 20 MG tablet Take 20 mg by mouth daily. (Patient not taking: Reported on 11/18/2021)   Not Taking   hyoscyamine (LEVSIN) 0.125 MG tablet Take 0.125 mg by mouth every 4 (four) hours as needed.   prn at unknown   loperamide (IMODIUM) 2 MG capsule Take 1 capsule (2 mg total) by mouth every 6 (six) hours as needed for diarrhea or loose stools. 30 capsule 0 prn at unknown   NURTEC 75 MG TBDP Take 75 mg by mouth daily as needed for migraine.      Vitamin D, Ergocalciferol, (DRISDOL) 1.25 MG (50000 UNIT) CAPS capsule Take 50,000 Units by mouth once a week. (Patient not taking:  Reported on 11/18/2021)   Not Taking   Scheduled:  Infusions:  PRN:  Anti-infectives (From admission, onward)  None       Assessment: Pharmacy consulted to start heparin for a PE. No DOAC PTA. Baseline labs ordered.   Goal of Therapy:  Heparin level 0.3-0.7 units/ml Monitor platelets by anticoagulation protocol: Yes   Plan:  2/14: HL @ 0553 = 0.98, SUPRAtherapeutic Will decrease heparin drip to 1250 units/hr.  Will recheck HL 8 hrs after rate change.   Avalie Oconnor D, PharmD 11/19/2021,6:37 AM

## 2021-11-19 NOTE — Discharge Summary (Signed)
Physician Discharge Summary   Patient: Caitlin Leonard MRN: [id number] DOB: March 30, 1946  Admit date:     11/18/2021  Discharge date: 11/19/21  Discharge Physician: Lorella Nimrod   PCP: Maryland Pink, MD   Recommendations at discharge:  Please obtain CBC and BMP in 1 week Follow-up with primary care provider within a week  Discharge Diagnoses: Principal Problem:   Shortness of breath Active Problems:   OSA (obstructive sleep apnea)   Obesity   Essential hypertension   Depression with anxiety   COPD (chronic obstructive pulmonary disease) ([place])   Hyperlipemia   Atrial fibrillation (HCC)   Stroke ([place])   Parkinson's disease ([place])   Positive D dimer   Hospital Course: Caitlin Leonard is a 76 year old female with history of anxiety, atrial fibrillation not on anticoagulation, history of stroke without residual deficits, irritable bowel disease, migraine headaches, history of intractable migraine with aura, Parkinson, chronic low back pain without sciatica, hyperlipidemia, who presents emergency department for chief concerns of shortness of breath worse with exertion. She was hemodynamically stable, remained on room air.  Mildly elevated blood pressure. D-dimer was mildly positive at 0.66.  TSH at 6.548, mildly elevated with normal free T4. COVID-19 PCR was positive, CT value of 36.5 which makes it a old infection.  She was positive on 10/15/2021 and most likely a lingering positive test.  No need for quarantine or any further management at this time. CT chest without contrast was without any acute abnormality and read as 1. No acute intrathoracic process. Bibasilar hypoventilatory changes. 2. Continued basilar predominant interstitial lung disease, with slight progression of subpleural scarring in a pattern suggesting UIP. 3. Persistent but improved upper lobe predominant ground-glass opacities consistent with chronic bronchiolitis and small airway disease. 4. Stable 4.1 cm  ascending thoracic aortic aneurysm. Recommend annual imaging followup by CTA or MRA. This recommendation follows 2010 ACCF/AHA/AATS/ACR/ASA/SCA/SCAI/SIR/STS/SVM Guidelines for the Diagnosis and Management of Patients with Thoracic Aortic Disease. Circulation. 2010; 121: M384-Y659. Aortic aneurysm NOS (ICD10-I71.9) 5. Aortic Atherosclerosis (ICD10-I70.0). Coronary artery atherosclerosis.  VQ scan was obtained due to her history of anaphylaxis with contrast as we cannot do a CTA.  It was negative for a PE.  Patient was on 2 different beta-blockers.  She will continue with metoprolol and discontinue propranolol.  If needed she can be started on ACE inhibitor or ARB for better control of blood pressure.  She will need a close follow-up with PCP who can adjust her medications as needed.  She will continue with her home medications and follow-up with her providers.  Consultants: None Procedures performed: None Disposition: Home Diet recommendation:  Discharge Diet Orders (From admission, onward)     Start     Ordered   11/19/21 0000  Diet - low sodium heart healthy        11/19/21 1146           Cardiac diet  DISCHARGE MEDICATION: Allergies as of 11/19/2021       Reactions   Contrast Media [iodinated Contrast Media] Anaphylaxis   Gadolinium Derivatives Anaphylaxis   Gadoversetamide Anaphylaxis   Shellfish Allergy Nausea And Vomiting, Other (See Comments)   Can eat shrimp but not oysters Dizziness,severe nausea and vomiting ( can eat shrimp CAN NOT EAT ORYSTERS) Other reaction(s): Other (See Comments) Dizziness,severe nausea and vomiting ( can eat shrimp CAN NOT EAT ORYSTERS) Can eat shrimp but not oysters Dizziness,severe nausea and vomiting ( can eat shrimp CAN NOT EAT ORYSTERS) Can eat shrimp but not oysters  Sulfa Antibiotics Hives, Other (See Comments)   GI upset GI upset GI upset GI upset GI upset GI upset Other reaction(s): Other (See Comments) GI upset GI  upset GI upset GI upset   Hydroxychloroquine Sulfate Hives, Other (See Comments)   Severe hives, all over like chicken pox.   Banana Nausea And Vomiting, Other (See Comments)   Raw bananas   Limonene Other (See Comments)   GI upset GI upset   Morphine And Related    Nabumetone Other (See Comments)   Hypertension   Otezla [apremilast]    Potassium-containing Compounds Other (See Comments)   GI upset   Prednisone Nausea And Vomiting   Sulfasalazine Hives, Other (See Comments)   GI upset   Sulfonamide Derivatives Hives   Metronidazole Rash   Sulfamethoxazole Rash        Medication List     STOP taking these medications    aspirin EC 81 MG tablet   carbidopa-levodopa 25-100 MG tablet Commonly known as: SINEMET IR   furosemide 20 MG tablet Commonly known as: LASIX   Vitamin D (Ergocalciferol) 1.25 MG (50000 UNIT) Caps capsule Commonly known as: DRISDOL       TAKE these medications    albuterol 108 (90 Base) MCG/ACT inhaler Commonly known as: VENTOLIN HFA Inhale 2 puffs into the lungs every 6 (six) hours as needed for wheezing or shortness of breath.   ALPRAZolam 0.5 MG tablet Commonly known as: XANAX Take 1 tablet (0.5 mg total) by mouth at bedtime.   budesonide 3 MG 24 hr capsule Commonly known as: ENTOCORT EC Take 9 mg by mouth daily.   busPIRone 10 MG tablet Commonly known as: BUSPAR Take 1 tablet (10 mg total) by mouth 2 (two) times daily.   carboxymethylcellulose 0.5 % Soln Commonly known as: REFRESH PLUS Place 1 drop into both eyes 2 (two) times daily.   cetirizine 10 MG tablet Commonly known as: ZYRTEC TAKE 1 TABLET(10 MG) BY MOUTH AT BEDTIME   diltiazem 180 MG 24 hr capsule Commonly known as: CARDIZEM CD Take 180 mg by mouth daily.   EpiPen 2-Pak 0.3 mg/0.3 mL Soaj injection Generic drug: EPINEPHrine Inject 0.3 mg into the muscle once.   hyoscyamine 0.125 MG tablet Commonly known as: LEVSIN Take 0.125 mg by mouth every 4 (four) hours  as needed.   loperamide 2 MG capsule Commonly known as: IMODIUM Take 1 capsule (2 mg total) by mouth every 6 (six) hours as needed for diarrhea or loose stools.   memantine 10 MG tablet Commonly known as: NAMENDA Take 10 mg by mouth 2 (two) times daily.   metoprolol tartrate 50 MG tablet Commonly known as: LOPRESSOR TAKE 1 TABLET(50 MG) BY MOUTH TWICE DAILY What changed: See the new instructions.   mirtazapine 30 MG tablet Commonly known as: REMERON Take 30 mg by mouth at bedtime.   Nurtec 75 MG Tbdp Generic drug: Rimegepant Sulfate Take 75 mg by mouth daily as needed for migraine.   omeprazole 40 MG capsule Commonly known as: PRILOSEC TAKE 1 CAPSULE BY MOUTH DAILY AT DINNER TIME What changed: See the new instructions.   rosuvastatin 40 MG tablet Commonly known as: CRESTOR Take 1 tablet (40 mg total) by mouth daily.   sertraline 100 MG tablet Commonly known as: ZOLOFT Take 0.5 tablets (50 mg total) by mouth at bedtime.        Follow-up Information     Maryland Pink, MD. Schedule an appointment as soon as possible for a visit in  1 week(s).   Specialty: Family Medicine Contact information: [address] [place] [address] [phone]                 Discharge Exam: Danley Danker Weights   11/18/21 1229  Weight: 117.9 kg   General.  Well-developed elderly lady, in no acute distress. Pulmonary.  Lungs clear bilaterally, normal respiratory effort. CV.  Regular rate and rhythm, no JVD, rub or murmur. Abdomen.  Soft, nontender, nondistended, BS positive. CNS.  Alert and oriented x3.  No focal neurologic deficit. Extremities.  No edema, no cyanosis, pulses intact and symmetrical. Psychiatry.  Judgment and insight appears normal.   Condition at discharge: stable stable The results of significant diagnostics from this hospitalization (including imaging, microbiology, ancillary and laboratory) are listed below for reference.   Imaging  Studies: DG Chest 2 View  Result Date: 11/18/2021 CLINICAL DATA:  Patient to ER via [facility] from [facility] independent living. Reports being taken off her propanolol on Tuesday at her pcp appointment. Last night, patient started feeling dizzy, unsteady EXAM: CHEST - 2 VIEW COMPARISON:  04/30/2019 FINDINGS: Relatively low lung volumes. Coarse interstitial opacities at the lung bases. No confluent airspace disease. Heart size and mediastinal contours are within normal limits. Aortic Atherosclerosis (ICD10-170.0). No effusion.  No pneumothorax. Visualized bones unremarkable. IMPRESSION: Low volumes.  No acute cardiopulmonary disease. Electronically Signed   By: Lucrezia Europe M.D.   On: 11/18/2021 13:02   CT CHEST WO CONTRAST  Result Date: 11/18/2021 CLINICAL DATA:  Dizziness, headache, short of breath, dyspnea on exertion EXAM: CT CHEST WITHOUT CONTRAST TECHNIQUE: Multidetector CT imaging of the chest was performed following the standard protocol without IV contrast. RADIATION DOSE REDUCTION: This exam was performed according to the departmental dose-optimization program which includes automated exposure control, adjustment of the mA and/or kV according to patient size and/or use of iterative reconstruction technique. COMPARISON:  11/18/2021, 03/13/2020 FINDINGS: Cardiovascular: Limited unenhanced imaging of the heart and great vessels demonstrates no pericardial effusion. 4.1 cm ascending thoracic aortic aneurysm unchanged. Evaluation of the vascular lumen is limited without IV contrast. Stable atherosclerosis of the aorta and coronary vasculature. Mediastinum/Nodes: No enlarged mediastinal or axillary lymph nodes. Thyroid gland, trachea, and esophagus demonstrate no significant findings. Lungs/Pleura: Scattered ground-glass opacities within the upper lobes are again noted, slightly improved since prior study, favor chronic bronchiolitis and small airway disease. Dependent hypoventilatory changes are seen within  the lungs, with persistent basilar predominant subpleural scarring again noted. No effusion or pneumothorax. The central airways are patent. Upper Abdomen: No acute abnormality. Musculoskeletal: No acute or destructive bony lesions. Reconstructed images demonstrate no additional findings. IMPRESSION: 1. No acute intrathoracic process. Bibasilar hypoventilatory changes. 2. Continued basilar predominant interstitial lung disease, with slight progression of subpleural scarring in a pattern suggesting UIP. 3. Persistent but improved upper lobe predominant ground-glass opacities consistent with chronic bronchiolitis and small airway disease. 4. Stable 4.1 cm ascending thoracic aortic aneurysm. Recommend annual imaging followup by CTA or MRA. This recommendation follows 2010 ACCF/AHA/AATS/ACR/ASA/SCA/SCAI/SIR/STS/SVM Guidelines for the Diagnosis and Management of Patients with Thoracic Aortic Disease. Circulation. 2010; 121: A355-D322. Aortic aneurysm NOS (ICD10-I71.9) 5. Aortic Atherosclerosis (ICD10-I70.0). Coronary artery atherosclerosis. Electronically Signed   By: Randa Ngo M.D.   On: 11/18/2021 20:46   NM Pulmonary Perfusion  Result Date: 11/19/2021 CLINICAL DATA:  Inpatient. Pulmonary embolism (PE) suspected, high prob. COVID positive. COPD. Dyspnea. EXAM: NUCLEAR MEDICINE PERFUSION LUNG SCAN TECHNIQUE: Perfusion images were obtained in multiple projections after intravenous injection  of radiopharmaceutical. Ventilation scans intentionally deferred if perfusion scan and chest x-ray adequate for interpretation during COVID 19 epidemic. RADIOPHARMACEUTICALS:  4.6 mCi Tc-92m MAA IV COMPARISON:  Chest radiograph and chest CT from one day prior. FINDINGS: No significant perfusion defects in either lung. Perfusion scan matches the shape of the lungs on recent chest radiograph. IMPRESSION: No perfusion lung scan evidence of acute pulmonary embolism. Electronically Signed   By: Ilona Sorrel M.D.   On: 11/19/2021  08:30    Microbiology: Results for orders placed or performed during the hospital encounter of 11/18/21  Resp Panel by RT-PCR (Flu A&B, Covid) Nasopharyngeal Swab     Status: Abnormal   Collection Time: 11/18/21 10:36 PM   Specimen: Nasopharyngeal Swab; Nasopharyngeal(NP) swabs in vial transport medium  Result Value Ref Range Status   SARS Coronavirus 2 by RT PCR POSITIVE (A) NEGATIVE Final    Comment: (NOTE) SARS-CoV-2 target nucleic acids are DETECTED.  The SARS-CoV-2 RNA is generally detectable in upper respiratory specimens during the acute phase of infection. Positive results are indicative of the presence of the identified virus, but do not rule out bacterial infection or co-infection with other pathogens not detected by the test. Clinical correlation with patient history and other diagnostic information is necessary to determine patient infection status. The expected result is Negative.  Fact Sheet for Patients: EntrepreneurPulse.com.au  Fact Sheet for Healthcare Providers: IncredibleEmployment.be  This test is not yet approved or cleared by the [place] FDA and  has been authorized for detection and/or diagnosis of SARS-CoV-2 by FDA under an Emergency Use Authorization (EUA).  This EUA will remain in effect (meaning this test can be used) for the duration of  the COVID-19 declaration under Section 564(b)(1) of the A ct, 21 U.S.C. section 360bbb-3(b)(1), unless the authorization is terminated or revoked sooner.     Influenza A by PCR NEGATIVE NEGATIVE Final   Influenza B by PCR NEGATIVE NEGATIVE Final    Comment: (NOTE) The Xpert Xpress SARS-CoV-2/FLU/RSV plus assay is intended as an aid in the diagnosis of influenza from Nasopharyngeal swab specimens and should not be used as a sole basis for treatment. Nasal washings and aspirates are unacceptable for Xpert Xpress SARS-CoV-2/FLU/RSV testing.  Fact Sheet for  Patients: EntrepreneurPulse.com.au  Fact Sheet for Healthcare Providers: IncredibleEmployment.be  This test is not yet approved or cleared by the [place] FDA and has been authorized for detection and/or diagnosis of SARS-CoV-2 by FDA under an Emergency Use Authorization (EUA). This EUA will remain in effect (meaning this test can be used) for the duration of the COVID-19 declaration under Section 564(b)(1) of the Act, 21 U.S.C. section 360bbb-3(b)(1), unless the authorization is terminated or revoked.  Performed at [facility], [address] [place],  [address]     Labs: CBC: Recent Labs  Lab 11/18/21 1232 11/19/21 0553  WBC 6.5 4.8  HGB 14.8 12.8  HCT 45.3 39.9  MCV 100.9* 102.0*  PLT 183 144*   Basic Metabolic Panel: Recent Labs  Lab 11/18/21 1232 11/19/21 0553  NA 137 140  K 3.7 3.1*  CL 102 107  CO2 26 24  GLUCOSE 189* 134*  BUN 19 15  CREATININE 0.81 0.47  CALCIUM 10.0 9.3  MG 2.0  --    Liver Function Tests: Recent Labs  Lab 11/18/21 1232  AST 23  ALT 12  ALKPHOS 67  BILITOT 1.1  PROT 7.1  ALBUMIN 3.9   CBG: No results for input(s): GLUCAP in the last  168 hours.  Discharge time spent: greater than 30 minutes.  Signed: Lorella Nimrod, MD [facility] 11/19/2021

## 2021-12-19 DIAGNOSIS — I951 Orthostatic hypotension: Secondary | ICD-10-CM | POA: Insufficient documentation

## 2021-12-19 DIAGNOSIS — R2689 Other abnormalities of gait and mobility: Secondary | ICD-10-CM | POA: Insufficient documentation

## 2021-12-19 DIAGNOSIS — R4189 Other symptoms and signs involving cognitive functions and awareness: Secondary | ICD-10-CM | POA: Insufficient documentation

## 2021-12-23 DIAGNOSIS — I639 Cerebral infarction, unspecified: Secondary | ICD-10-CM | POA: Diagnosis present

## 2021-12-30 ENCOUNTER — Other Ambulatory Visit: Payer: Self-pay | Admitting: Family Medicine

## 2021-12-30 DIAGNOSIS — Z1231 Encounter for screening mammogram for malignant neoplasm of breast: Secondary | ICD-10-CM

## 2022-02-04 ENCOUNTER — Ambulatory Visit
Admission: RE | Admit: 2022-02-04 | Discharge: 2022-02-04 | Disposition: A | Discharge status: Home / Self Care | Payer: Medicare PPO | Source: Ambulatory Visit | Attending: Family Medicine | Admitting: Family Medicine

## 2022-02-04 DIAGNOSIS — Z1231 Encounter for screening mammogram for malignant neoplasm of breast: Secondary | ICD-10-CM | POA: Insufficient documentation

## 2022-03-26 ENCOUNTER — Emergency Department: Payer: Medicare PPO

## 2022-03-26 ENCOUNTER — Other Ambulatory Visit: Payer: Self-pay

## 2022-03-26 ENCOUNTER — Observation Stay
Admission: EM | Admit: 2022-03-26 | Discharge: 2022-03-28 | Disposition: A | Discharge status: Skilled Nursing Facility | Payer: Medicare PPO | Attending: Internal Medicine | Admitting: Internal Medicine

## 2022-03-26 DIAGNOSIS — F418 Other specified anxiety disorders: Secondary | ICD-10-CM | POA: Diagnosis present

## 2022-03-26 DIAGNOSIS — J449 Chronic obstructive pulmonary disease, unspecified: Secondary | ICD-10-CM | POA: Insufficient documentation

## 2022-03-26 DIAGNOSIS — W010XXA Fall on same level from slipping, tripping and stumbling without subsequent striking against object, initial encounter: Secondary | ICD-10-CM | POA: Insufficient documentation

## 2022-03-26 DIAGNOSIS — S92902A Unspecified fracture of left foot, initial encounter for closed fracture: Secondary | ICD-10-CM

## 2022-03-26 DIAGNOSIS — E119 Type 2 diabetes mellitus without complications: Secondary | ICD-10-CM | POA: Diagnosis not present

## 2022-03-26 DIAGNOSIS — Z96652 Presence of left artificial knee joint: Secondary | ICD-10-CM | POA: Insufficient documentation

## 2022-03-26 DIAGNOSIS — S92255A Nondisplaced fracture of navicular [scaphoid] of left foot, initial encounter for closed fracture: Principal | ICD-10-CM | POA: Insufficient documentation

## 2022-03-26 DIAGNOSIS — K52831 Collagenous colitis: Secondary | ICD-10-CM | POA: Diagnosis not present

## 2022-03-26 DIAGNOSIS — J45909 Unspecified asthma, uncomplicated: Secondary | ICD-10-CM | POA: Insufficient documentation

## 2022-03-26 DIAGNOSIS — Z79899 Other long term (current) drug therapy: Secondary | ICD-10-CM | POA: Diagnosis not present

## 2022-03-26 DIAGNOSIS — Z7982 Long term (current) use of aspirin: Secondary | ICD-10-CM | POA: Diagnosis not present

## 2022-03-26 DIAGNOSIS — G4733 Obstructive sleep apnea (adult) (pediatric): Secondary | ICD-10-CM | POA: Diagnosis not present

## 2022-03-26 DIAGNOSIS — S92252A Displaced fracture of navicular [scaphoid] of left foot, initial encounter for closed fracture: Secondary | ICD-10-CM | POA: Diagnosis not present

## 2022-03-26 DIAGNOSIS — K5900 Constipation, unspecified: Secondary | ICD-10-CM | POA: Diagnosis not present

## 2022-03-26 DIAGNOSIS — S8992XA Unspecified injury of left lower leg, initial encounter: Secondary | ICD-10-CM | POA: Diagnosis present

## 2022-03-26 DIAGNOSIS — I10 Essential (primary) hypertension: Secondary | ICD-10-CM | POA: Diagnosis not present

## 2022-03-26 DIAGNOSIS — F32A Depression, unspecified: Secondary | ICD-10-CM | POA: Diagnosis present

## 2022-03-26 DIAGNOSIS — I2510 Atherosclerotic heart disease of native coronary artery without angina pectoris: Secondary | ICD-10-CM | POA: Diagnosis present

## 2022-03-26 DIAGNOSIS — Z8673 Personal history of transient ischemic attack (TIA), and cerebral infarction without residual deficits: Secondary | ICD-10-CM | POA: Insufficient documentation

## 2022-03-26 DIAGNOSIS — I4891 Unspecified atrial fibrillation: Secondary | ICD-10-CM | POA: Diagnosis not present

## 2022-03-26 DIAGNOSIS — E669 Obesity, unspecified: Secondary | ICD-10-CM | POA: Diagnosis present

## 2022-03-26 LAB — CBC
HCT: 41.4 % (ref 36.0–46.0)
Hemoglobin: 13.1 g/dL (ref 12.0–15.0)
MCH: 31.7 pg (ref 26.0–34.0)
MCHC: 31.6 g/dL (ref 30.0–36.0)
MCV: 100.2 fL — ABNORMAL HIGH (ref 80.0–100.0)
Platelets: 78 10*3/uL — ABNORMAL LOW (ref 150–400)
RBC: 4.13 MIL/uL (ref 3.87–5.11)
RDW: 13 % (ref 11.5–15.5)
WBC: 5.9 10*3/uL (ref 4.0–10.5)
nRBC: 0 % (ref 0.0–0.2)

## 2022-03-26 LAB — CREATININE, SERUM
Creatinine, Ser: 0.63 mg/dL (ref 0.44–1.00)
GFR, Estimated: >60 mL/min (ref >60)

## 2022-03-26 MED ORDER — HYDROCODONE-ACETAMINOPHEN 5-325 MG PO TABS
1.0000 | ORAL_TABLET | Freq: Once | ORAL | Status: AC
  Administered 2022-03-26: 1 via ORAL
  Filled 2022-03-26: qty 1

## 2022-03-26 MED ORDER — ASPIRIN 81 MG PO CHEW
81.0000 mg | CHEWABLE_TABLET | Freq: Every day | ORAL | Status: DC
  Administered 2022-03-26 – 2022-03-28 (×3): 81 mg via ORAL
  Filled 2022-03-26 (×3): qty 1

## 2022-03-26 MED ORDER — ONDANSETRON HCL 4 MG PO TABS: 4.0000 mg | ORAL_TABLET | Freq: Four times a day (QID) | ORAL | Status: DC | PRN

## 2022-03-26 MED ORDER — MIRTAZAPINE 15 MG PO TABS
30.0000 mg | ORAL_TABLET | Freq: Every day | ORAL | Status: DC
  Administered 2022-03-26 – 2022-03-27 (×2): 30 mg via ORAL
  Filled 2022-03-26 (×2): qty 2

## 2022-03-26 MED ORDER — ACETAMINOPHEN 650 MG RE SUPP: 650.0000 mg | Freq: Four times a day (QID) | RECTAL | Status: DC | PRN

## 2022-03-26 MED ORDER — POLYETHYLENE GLYCOL 3350 17 G PO PACK: 17.0000 g | PACK | Freq: Every day | ORAL | Status: DC | PRN

## 2022-03-26 MED ORDER — SODIUM CHLORIDE 0.9% FLUSH
3.0000 mL | Freq: Two times a day (BID) | INTRAVENOUS | Status: DC
  Administered 2022-03-26 – 2022-03-28 (×4): 3 mL via INTRAVENOUS

## 2022-03-26 MED ORDER — SERTRALINE HCL 50 MG PO TABS
50.0000 mg | ORAL_TABLET | Freq: Every day | ORAL | Status: DC
  Administered 2022-03-26 – 2022-03-27 (×2): 50 mg via ORAL
  Filled 2022-03-26 (×2): qty 1

## 2022-03-26 MED ORDER — HYDROMORPHONE HCL 1 MG/ML IJ SOLN
0.5000 mg | INTRAMUSCULAR | Status: DC | PRN
  Administered 2022-03-26 – 2022-03-28 (×5): 0.5 mg via INTRAVENOUS
  Filled 2022-03-26: qty 0.5
  Filled 2022-03-26 (×2): qty 1
  Filled 2022-03-26: qty 0.5
  Filled 2022-03-26 (×2): qty 1

## 2022-03-26 MED ORDER — ENOXAPARIN SODIUM 60 MG/0.6ML IJ SOSY
0.5000 mg/kg | PREFILLED_SYRINGE | INTRAMUSCULAR | Status: DC
  Administered 2022-03-26 – 2022-03-27 (×2): 57.5 mg via SUBCUTANEOUS
  Filled 2022-03-26 (×2): qty 0.6

## 2022-03-26 MED ORDER — METOPROLOL TARTRATE 50 MG PO TABS
50.0000 mg | ORAL_TABLET | Freq: Two times a day (BID) | ORAL | Status: DC
Start: 2022-03-26 — End: 2022-03-28
  Administered 2022-03-26 – 2022-03-27 (×2): 50 mg via ORAL
  Filled 2022-03-26 (×4): qty 1

## 2022-03-26 MED ORDER — ACETAMINOPHEN 325 MG PO TABS
650.0000 mg | ORAL_TABLET | Freq: Four times a day (QID) | ORAL | Status: DC | PRN
  Administered 2022-03-26: 650 mg via ORAL
  Filled 2022-03-26: qty 2

## 2022-03-26 MED ORDER — ALPRAZOLAM 0.5 MG PO TABS
0.5000 mg | ORAL_TABLET | Freq: Every day | ORAL | Status: DC
  Administered 2022-03-26 – 2022-03-27 (×2): 0.5 mg via ORAL
  Filled 2022-03-26 (×2): qty 1

## 2022-03-26 MED ORDER — BUDESONIDE 3 MG PO CPEP
9.0000 mg | ORAL_CAPSULE | Freq: Every day | ORAL | Status: DC
  Administered 2022-03-27: 9 mg via ORAL
  Filled 2022-03-26 (×3): qty 3

## 2022-03-26 MED ORDER — POLYVINYL ALCOHOL 1.4 % OP SOLN
1.0000 [drp] | Freq: Two times a day (BID) | OPHTHALMIC | Status: DC
Start: 2022-03-27 — End: 2022-03-28
  Administered 2022-03-27: 1 [drp] via OPHTHALMIC
  Filled 2022-03-26: qty 15

## 2022-03-26 MED ORDER — ONDANSETRON HCL 4 MG/2ML IJ SOLN: 4.0000 mg | Freq: Four times a day (QID) | INTRAMUSCULAR | Status: DC | PRN

## 2022-03-26 MED ORDER — LORATADINE 10 MG PO TABS
10.0000 mg | ORAL_TABLET | Freq: Every day | ORAL | Status: DC
  Administered 2022-03-27 – 2022-03-28 (×2): 10 mg via ORAL
  Filled 2022-03-26 (×2): qty 1

## 2022-03-26 MED ORDER — ROSUVASTATIN CALCIUM 10 MG PO TABS
40.0000 mg | ORAL_TABLET | Freq: Every day | ORAL | Status: DC
  Administered 2022-03-26 – 2022-03-27 (×2): 40 mg via ORAL
  Filled 2022-03-26: qty 2
  Filled 2022-03-26 (×2): qty 4

## 2022-03-26 MED ORDER — HYDROCODONE-ACETAMINOPHEN 5-325 MG PO TABS
1.0000 | ORAL_TABLET | ORAL | Status: DC | PRN
  Administered 2022-03-27: 2 via ORAL
  Filled 2022-03-26: qty 1
  Filled 2022-03-26: qty 2

## 2022-03-26 MED ORDER — PANTOPRAZOLE SODIUM 40 MG PO TBEC
40.0000 mg | DELAYED_RELEASE_TABLET | Freq: Every day | ORAL | Status: DC
  Administered 2022-03-27 – 2022-03-28 (×2): 40 mg via ORAL
  Filled 2022-03-26 (×2): qty 1

## 2022-03-26 NOTE — H&P (Signed)
History and Physical    Patient: Caitlin Leonard DOB: 1945/11/07 DOA: 03/26/2022 DOS: the patient was seen and examined on 03/26/2022 PCP: Maryland Pink, MD  Patient coming from: ALF/ILF  Chief Complaint:  Chief Complaint  Patient presents with   Fall   HPI: Caitlin Leonard is a 76 y.o. female with medical history significant of hypertension, collagenous colitis, depression, GERD and CAD was brought to ED via EMS after having a mechanical fall 2 days ago when she tripped over a carpet resulted in laceration of right lower extremity which were bandaged and her left foot was caught under a piece of furniture.  Continued to have worsening left foot pain and unable to bear any weight.  Rest of the review of system was negative.  No recent illnesses or sick contacts.  ED course.  Hemodynamically stable.  Labs pretty much unremarkable except platelet of 78.  Left foot x-ray was without any significant abnormality.  CT left foot with Multiple small acute midfoot fractures as described above with evidence of Lisfranc ligament injury. Podiatry was consulted and there is no plan for any surgical intervention.  They advised splinting and nonweightbearing. Podiatry will see the patient tomorrow.  TRH was admitted for pain management.  Review of Systems: As mentioned in the history of present illness. All other systems reviewed and are negative. Past Medical History:  Diagnosis Date   Anemia    Anxiety    Arthritis    osteoarthritis   Asthma    due to seasonal alllergies   Atrial fibrillation (HCC)    Carpal tunnel syndrome    Cataract    Cervicalgia    Collagenous colitis 2010   COPD (chronic obstructive pulmonary disease) (HCC)    Depression    situational   Diabetes mellitus without complication (HCC)    Dysrhythmia    GERD (gastroesophageal reflux disease)    GI bleed    Headache(784.0)    migraines; 2 x month   Heart murmur    MVP ( symptomatic)   History of  IBS    Hyperlipemia    Hypertension    on medication x 10 years   Migraine    MVP (mitral valve prolapse)    Shortness of breath    exertional   Sinoatrial node dysfunction ([place])    Sleep apnea 2007   does not use CPAP since 40 # wt loss   Stroke [facility])    Syncopal episodes    Past Surgical History:  Procedure Laterality Date   ABDOMINAL HYSTERECTOMY  1979   BACK SURGERY     BREAST CYST ASPIRATION Right 25 plus yrs ago   CARDIAC CATHETERIZATION  2012   [place]  2011   COLONOSCOPY WITH PROPOFOL N/A 03/25/2019   Procedure: COLONOSCOPY WITH PROPOFOL;  Surgeon: Lollie Sails, MD;  Location: [facility] ENDOSCOPY;  Service: Endoscopy;  Laterality: N/A;   COLONOSCOPY WITH PROPOFOL N/A 11/29/2020   Procedure: COLONOSCOPY WITH PROPOFOL;  Surgeon: Lesly Rubenstein, MD;  Location: [facility] ENDOSCOPY;  Service: Endoscopy;  Laterality: N/A;   ESOPHAGOGASTRODUODENOSCOPY (EGD) WITH PROPOFOL N/A 03/25/2019   Procedure: ESOPHAGOGASTRODUODENOSCOPY (EGD) WITH PROPOFOL;  Surgeon: Lollie Sails, MD;  Location: [facility] ENDOSCOPY;  Service: Endoscopy;  Laterality: N/A;   ESOPHAGOGASTRODUODENOSCOPY (EGD) WITH PROPOFOL N/A 11/29/2020   Procedure: ESOPHAGOGASTRODUODENOSCOPY (EGD) WITH PROPOFOL;  Surgeon: Lesly Rubenstein, MD;  Location: [facility] ENDOSCOPY;  Service: Endoscopy;  Laterality: N/A;   FLEXIBLE BRONCHOSCOPY N/A 10/10/2016   Procedure: FLEXIBLE BRONCHOSCOPY;  Surgeon: [name], MD;  Location: [facility] ORS;  Service: Pulmonary;  Laterality: N/A;   FLEXIBLE BRONCHOSCOPY N/A 12/11/2017   Procedure: FLEXIBLE BRONCHOSCOPY;  Surgeon: [name], MD;  Location: [facility] ORS;  Service: Pulmonary;  Laterality: N/A;   JOINT REPLACEMENT     KNEE ARTHROSCOPY  1985, 1990, 2012 x 2   bilateral   LUMBAR LAMINECTOMY  4536,4680   NASAL SINUS SURGERY  1994   TONSILLECTOMY     TOTAL KNEE ARTHROPLASTY  12/22/2011   Procedure: TOTAL KNEE ARTHROPLASTY;  Surgeon: Rudean Haskell, MD;  Location: [place];   Service: Orthopedics;  Laterality: Left;   Social History:  reports that she has never smoked. She has never used smokeless tobacco. She reports current alcohol use of about 1.0 standard drink of alcohol per week. She reports that she does not use drugs.  Allergies  Allergen Reactions   Contrast Media [Iodinated Contrast Media] Anaphylaxis   Gadolinium Derivatives Anaphylaxis   Gadoversetamide Anaphylaxis   Shellfish Allergy Nausea And Vomiting and Other (See Comments)    Can eat shrimp but not oysters Dizziness,severe nausea and vomiting ( can eat shrimp CAN NOT EAT ORYSTERS) Other reaction(s): Other (See Comments) Dizziness,severe nausea and vomiting ( can eat shrimp CAN NOT EAT ORYSTERS)  Can eat shrimp but not oysters Dizziness,severe nausea and vomiting ( can eat shrimp CAN NOT EAT ORYSTERS)  Can eat shrimp but not oysters    Sulfa Antibiotics Hives and Other (See Comments)    GI upset GI upset GI upset  GI upset GI upset GI upset Other reaction(s): Other (See Comments) GI upset GI upset  GI upset GI upset    Hydroxychloroquine Sulfate Hives and Other (See Comments)    Severe hives, all over like chicken pox.   Banana Nausea And Vomiting and Other (See Comments)    Raw bananas   Limonene Other (See Comments)    GI upset GI upset    Morphine And Related    Nabumetone Other (See Comments)    Hypertension   Otezla [Apremilast]    Potassium-Containing Compounds Other (See Comments)    GI upset   Prednisone Nausea And Vomiting   Sulfasalazine Hives and Other (See Comments)    GI upset   Sulfonamide Derivatives Hives   Metronidazole Rash   Sulfamethoxazole Rash    Family History  Problem Relation Age of Onset   Hypertension Mother    Hyperlipidemia Mother    Diabetes Mother    Hypertension Father    Hyperlipidemia Father    Arrhythmia Father        A-Fib   Diabetes Paternal Uncle    Diabetes Paternal Grandfather    Breast cancer Maternal Aunt         great in late 35's   Anesthesia problems Neg Hx    Colon cancer Neg Hx    Stomach cancer Neg Hx    Rectal cancer Neg Hx    Liver cancer Neg Hx    Esophageal cancer Neg Hx     Prior to Admission medications   Medication Sig Start Date End Date Taking? Authorizing Provider  ALPRAZolam Duanne Moron) 0.5 MG tablet Take 1 tablet (0.5 mg total) by mouth at bedtime. 02/22/21  Yes Elwanda Brooklyn, NP  aspirin 81 MG chewable tablet Chew 81 mg by mouth daily.   Yes [provider]  budesonide (ENTOCORT EC) 3 MG 24 hr capsule Take 9 mg by mouth daily. 03/29/20  Yes [provider]  busPIRone (BUSPAR) 10 MG tablet Take 1 tablet (10 mg total) by mouth 2 (two) times daily. 11/19/21  Yes Lorella Nimrod, MD  carboxymethylcellulose (REFRESH PLUS) 0.5 % SOLN Place 1 drop into both eyes 2 (two) times daily.   Yes [provider]  cetirizine (ZYRTEC) 10 MG tablet TAKE 1 TABLET(10 MG) BY MOUTH AT BEDTIME 01/20/20  Yes Tyler Pita, MD  memantine (NAMENDA) 10 MG tablet Take 10 mg by mouth 2 (two) times daily. 10/10/20  Yes [provider]  metoprolol tartrate (LOPRESSOR) 50 MG tablet TAKE 1 TABLET(50 MG) BY MOUTH TWICE DAILY Patient taking differently: Take 50 mg by mouth 2 (two) times daily. 11/24/18  Yes [name], MD  mirtazapine (REMERON) 30 MG tablet Take 30 mg by mouth at bedtime. 11/03/21  Yes [provider]  omeprazole (PRILOSEC) 40 MG capsule TAKE 1 CAPSULE BY MOUTH DAILY AT DINNER TIME Patient taking differently: Take 40 mg by mouth daily. 05/30/19  Yes [name], MD  rosuvastatin (CRESTOR) 40 MG tablet Take 1 tablet (40 mg total) by mouth daily. 11/11/16 03/26/22 Yes Gollan, Kathlene November, MD  sertraline (ZOLOFT) 100 MG tablet Take 0.5 tablets (50 mg total) by mouth at bedtime. 11/19/21  Yes Lorella Nimrod, MD  albuterol (VENTOLIN HFA) 108 (90 Base) MCG/ACT inhaler Inhale 2 puffs into the lungs every 6 (six) hours as needed for wheezing or shortness of  breath. Patient not taking: Reported on 11/18/2021 11/02/19   Tyler Pita, MD  diltiazem (CARDIZEM CD) 180 MG 24 hr capsule Take 180 mg by mouth daily. Patient not taking: Reported on 03/26/2022 10/01/20   [provider]  EPIPEN 2-PAK 0.3 MG/0.3ML SOAJ injection Inject 0.3 mg into the muscle once.     [provider]  hyoscyamine (LEVSIN) 0.125 MG tablet Take 0.125 mg by mouth every 4 (four) hours as needed. Patient not taking: Reported on 03/26/2022    [provider]  loperamide (IMODIUM) 2 MG capsule Take 1 capsule (2 mg total) by mouth every 6 (six) hours as needed for diarrhea or loose stools. 05/04/19   Sudini, Srikar, MD  NURTEC 75 MG TBDP Take 75 mg by mouth daily as needed for migraine. 11/16/21   [provider]    Physical Exam: Vitals:   03/26/22 1524 03/26/22 1525 03/26/22 1526 03/26/22 1852  BP:    (!) 123/52  Pulse: 70 70 71 77  Resp:    18  Temp:    98.7 F (37.1 C)  TempSrc:      SpO2: (!) 89% 90% 93% 100%  Weight:      Height:        General: Vital signs reviewed.  Patient is well-developed and well-nourished, in no acute distress and cooperative with exam.  Head: Normocephalic and atraumatic. Eyes: EOMI, conjunctivae normal, no scleral icterus.  Neck: Supple, trachea midline, normal ROM,  Cardiovascular: RRR, S1 normal, S2 normal, Pulmonary/Chest: Clear to auscultation bilaterally, no wheezes, rales, or rhonchi. Abdominal: Soft, non-tender, non-distended, BS +,  Extremities: No lower extremity edema bilaterally,  pulses symmetric and intact bilaterally.  Left foot and ankle with splint. Neurological: A&O x3, Strength is normal and symmetric bilaterally, cranial nerve II-XII are grossly intact, no focal motor deficit, sensory intact to light touch bilaterally.  Psychiatric: Normal mood and affect.   Data Reviewed: Prior data reviewed.  Assessment and Plan: * Avulsion fracture of navicular bone of left foot Secondary to  fall.  Imaging with multiple fractures involving the  midfoot. Podiatry was consulted from ED, they will see the patient tomorrow. No surgery recommended at this time, splint was applied. -Continue with pain management -Follow-up podiatry recommendations -PT/OT evaluation  Obesity Estimated body mass index is 38.01 kg/m as calculated from the following:   Height as of this encounter: _0  (1.727 m).   Weight as of this encounter: 113.4 kg.   -This will complicate overall prognosis  Essential hypertension - Continue home dose of metoprolol  Collagenous colitis - Continue home dose of budesonide  Coronary artery calcification No acute concern. -Continue home aspirin and Crestor  Depression with anxiety - Continue home dose of Remeron and Zoloft  Advance Care Planning:   Code Status: Full Code   Consults: Podiatry  Family Communication:   Severity of Illness: The appropriate patient status for this patient is OBSERVATION. Observation status is judged to be reasonable and necessary in order to provide the required intensity of service to ensure the patient's safety. The patient's presenting symptoms, physical exam findings, and initial radiographic and laboratory data in the context of their medical condition is felt to place them at decreased risk for further clinical deterioration. Furthermore, it is anticipated that the patient will be medically stable for discharge from the hospital within 2 midnights of admission.   This record has been created using Systems analyst. Errors have been sought and corrected,but may not always be located. Such creation errors do not reflect on the standard of care.   Author: Lorella Nimrod, MD 03/26/2022 7:45 PM  For on call review [email or web address].

## 2022-03-26 NOTE — Assessment & Plan Note (Signed)
-   Continue home dose of metoprolol

## 2022-03-26 NOTE — Assessment & Plan Note (Signed)
-   Continue home dose of budesonide

## 2022-03-26 NOTE — Assessment & Plan Note (Signed)
Estimated body mass index is 38.01 kg/m as calculated from the following:   Height as of this encounter: '5\' 8"'$  (1.727 m).   Weight as of this encounter: 113.4 kg.   -This will complicate overall prognosis

## 2022-03-26 NOTE — ED Triage Notes (Signed)
Pt to ED from [facility] for mechanical fall 2 days ago with bruising to top and bottom of L foot. Pt states has laceration to R LE which is currently wrapped in gauze. Pt endorses 10/10 pain to L foot; L ankle is swollen compared to R but pt is able to move ankle and toes. Pt states unable to bear weight on L foot.  Last PO intake was last night. Uses walker at home. Hx a fib and stroke.

## 2022-03-26 NOTE — Assessment & Plan Note (Signed)
-   Continue home dose of Remeron and Zoloft

## 2022-03-26 NOTE — Assessment & Plan Note (Signed)
No acute concern. -Continue home aspirin and Crestor

## 2022-03-26 NOTE — ED Provider Notes (Signed)
[facility] Provider Note    Event Date/Time   First MD Initiated Contact with Patient 03/26/22 [phone]     (approximate)   History   Fall   HPI  Caitlin Leonard is a 76 y.o. female   presents to the ED from [facility] via EMS after mechanical fall 2 days ago when she tripped on a carpet.  Patient states that she had an injury to both lower extremities and EMS was called.  EMS put a dressing on her right lower extremity due to some bleeding.  Patient states that she has been unable to bear weight since that time and today is unable to manage the pain.  Patient denies any head injury or loss of consciousness.  Patient has history of diabetes, hypertension, CVA, mitral valve prolapse, depression, COPD, atrial fibs, sleep apnea, anxiety, and past history of GI bleed.      Physical Exam   Triage Vital Signs: ED Triage Vitals  Enc Vitals Group     BP      Pulse      Resp      Temp      Temp src      SpO2      Weight      Height      Head Circumference      Peak Flow      Pain Score      Pain Loc      Pain Edu?      Excl. in [place]?     Most recent vital signs: Vitals:   03/26/22 1525 03/26/22 1526  BP:    Pulse: 70 71  Resp:    Temp:    SpO2: 90% 93%     General: Awake, no distress.  Alert, talkative. CV:  Good peripheral perfusion.  Resp:  Normal effort.  Abd:  No distention.  Other:  On examination of the left lower extremity there is some tenderness on palpation of her left hip but no deformity and no abrasions are noted.  Minimal tenderness on palpation of the left knee and no effusion is appreciated.  No skin discoloration or abrasions are seen.  Patient does have tenderness on palpation of the tib-fib however bilateral malleolus and left foot with marked soft tissue edema and pain with palpation.   ED Results / Procedures / Treatments   Labs (all labs ordered are listed, but only abnormal results are displayed) Labs Reviewed  CBC   CREATININE, SERUM      RADIOLOGY X-rays of the left foot and ankle were reviewed by me and interpreted prior to radiology read as negative.  Radiology report negative for any acute fracture or dislocation in this area. Left hip x-ray with pelvis images were reviewed by me and interpreted prior to radiology report which was negative for acute bony injury.  Radiology report also was negative. CT tib-fib and foot without contrast was noted to have a Lisfranc injury with multiple fractures in her left foot.    PROCEDURES:  Critical Care performed:   Procedures   MEDICATIONS ORDERED IN ED: Medications  HYDROmorphone (DILAUDID) injection 0.5 mg (has no administration in time range)  enoxaparin (LOVENOX) injection 40 mg (has no administration in time range)  sodium chloride flush (NS) 0.9 % injection 3 mL (has no administration in time range)  acetaminophen (TYLENOL) tablet 650 mg (has no administration in time range)    Or  acetaminophen (TYLENOL) suppository 650 mg (has no administration  in time range)  HYDROcodone-acetaminophen (NORCO/VICODIN) 5-325 MG per tablet 1-2 tablet (has no administration in time range)  polyethylene glycol (MIRALAX / GLYCOLAX) packet 17 g (has no administration in time range)  ondansetron (ZOFRAN) tablet 4 mg (has no administration in time range)    Or  ondansetron (ZOFRAN) injection 4 mg (has no administration in time range)  ALPRAZolam (XANAX) tablet 0.5 mg (has no administration in time range)  budesonide (ENTOCORT EC) 24 hr capsule 9 mg (has no administration in time range)  carboxymethylcellulose (REFRESH PLUS) 0.5 % ophthalmic solution 1 drop (has no administration in time range)  loratadine (CLARITIN) tablet 10 mg (has no administration in time range)  metoprolol tartrate (LOPRESSOR) tablet 50 mg (has no administration in time range)  mirtazapine (REMERON) tablet 30 mg (has no administration in time range)  pantoprazole (PROTONIX) EC tablet 40 mg  (has no administration in time range)  rosuvastatin (CRESTOR) tablet 40 mg (has no administration in time range)  sertraline (ZOLOFT) tablet 50 mg (has no administration in time range)  HYDROcodone-acetaminophen (NORCO/VICODIN) 5-325 MG per tablet 1 tablet (1 tablet Oral Given 03/26/22 1208)     IMPRESSION / MDM / ASSESSMENT AND PLAN / ED COURSE  I reviewed the triage vital signs and the nursing notes.   Differential diagnosis includes, but is not limited to, fractured left ankle, fracture left foot, sprain left ankle, contusion left hip, hip fracture.  76 year old female presents to the ED with complaint of inability to bear weight since her fall 2 days ago in which she injured her left foot and ankle.  Patient generally walks with a walker and states that she fell secondary to tripping on a carpet.  No head injury or loss of consciousness heard during this event.  On exam patient is markedly tender to palpation of the left foot and ankle with moderate soft tissue edema.  X-rays of this area were reviewed and no acute fracture was noted on x-ray and radiology report agreed.  X-rays of the left hip and pelvis were negative for acute fracture.  Patient continued to have pain and with the marked amount of edema and her inability to bear weight I spoke with Dr. Charna Archer who is my attending doctor today and CT scan of the left tib-fib and left foot without contrast was ordered.  Multiple fractures of her left foot and Lisfranc ligament injury was noted on CT.  I spoke with Dr. Sharlotte Alamo who is on-call for podiatry who advised to place in a splint and gradually advance to a cam walker type boot.  Patient is to remain nonweightbearing for several days.  Consult for the hospitalist to admit this patient for pain control and also to work with physical therapy and possibly make arrangements for rehab at [facility].  Dr. Cleda Mccreedy will follow up with her in his office but states that he does not believe that this is a  surgical case at this time.      Patient's presentation is most consistent with acute complicated illness / injury requiring diagnostic workup.  FINAL CLINICAL IMPRESSION(S) / ED DIAGNOSES   Final diagnoses:  Multiple closed fractures of left foot, initial encounter     Rx / DC Orders   ED Discharge Orders     None        Note:  This document was prepared using Dragon voice recognition software and may include unintentional dictation errors.   Johnn Hai, PA-C 03/26/22 1539    Conni Slipper  F, MD 03/26/22 1553

## 2022-03-26 NOTE — Assessment & Plan Note (Signed)
Secondary to fall.  Imaging with multiple fractures involving the midfoot. Podiatry was consulted from ED, they will see the patient tomorrow. No surgery recommended at this time, splint was applied. -Continue with pain management -Follow-up podiatry recommendations -PT/OT evaluation

## 2022-03-26 NOTE — Plan of Care (Signed)

## 2022-03-27 DIAGNOSIS — I10 Essential (primary) hypertension: Secondary | ICD-10-CM

## 2022-03-27 DIAGNOSIS — F418 Other specified anxiety disorders: Secondary | ICD-10-CM

## 2022-03-27 DIAGNOSIS — K52831 Collagenous colitis: Secondary | ICD-10-CM | POA: Diagnosis not present

## 2022-03-27 DIAGNOSIS — S92252A Displaced fracture of navicular [scaphoid] of left foot, initial encounter for closed fracture: Secondary | ICD-10-CM | POA: Diagnosis not present

## 2022-03-27 DIAGNOSIS — I2510 Atherosclerotic heart disease of native coronary artery without angina pectoris: Secondary | ICD-10-CM

## 2022-03-27 DIAGNOSIS — I2584 Coronary atherosclerosis due to calcified coronary lesion: Secondary | ICD-10-CM

## 2022-03-27 LAB — CBC
HCT: 38.7 % (ref 36.0–46.0)
Hemoglobin: 12.4 g/dL (ref 12.0–15.0)
MCH: 31.8 pg (ref 26.0–34.0)
MCHC: 32 g/dL (ref 30.0–36.0)
MCV: 99.2 fL (ref 80.0–100.0)
Platelets: 150 10*3/uL (ref 150–400)
RBC: 3.9 MIL/uL (ref 3.87–5.11)
RDW: 13.5 % (ref 11.5–15.5)
WBC: 7 10*3/uL (ref 4.0–10.5)
nRBC: 0 % (ref 0.0–0.2)

## 2022-03-27 LAB — GLUCOSE, CAPILLARY: Glucose-Capillary: 111 mg/dL — ABNORMAL HIGH (ref 70–99)

## 2022-03-27 MED ORDER — SENNOSIDES-DOCUSATE SODIUM 8.6-50 MG PO TABS
1.0000 | ORAL_TABLET | Freq: Every day | ORAL | Status: DC
  Administered 2022-03-27: 1 via ORAL
  Filled 2022-03-27: qty 1

## 2022-03-27 MED ORDER — POLYETHYLENE GLYCOL 3350 17 G PO PACK
17.0000 g | PACK | Freq: Two times a day (BID) | ORAL | Status: DC
  Administered 2022-03-28: 17 g via ORAL
  Filled 2022-03-27 (×2): qty 1

## 2022-03-27 NOTE — Progress Notes (Signed)
Cross Cover Nurse informs me patient has DNR code preference and has prior facility documentation as such as well as living will.  Code status changed to full no code in epic

## 2022-03-27 NOTE — TOC Progression Note (Signed)
Transition of Care [facility]) - Progression Note    Patient Details  Name: Caitlin Leonard MRN: [id number] Date of Birth: 1946/07/10  Transition of Care [facility]) CM/SW [place], RN Phone Number: 03/27/2022, 1:51 PM  Clinical Narrative:     Ins auth started and pending, ref number [id number]  Expected Discharge Plan: [place] Barriers to Discharge: Continued Medical Work up, Ship broker  Expected Discharge Plan and Services Expected Discharge Plan: [place]   Discharge Planning Services: CM Consult   Living arrangements for the past 2 months: Apartment                                       Social Determinants of Health (SDOH) Interventions    Readmission Risk Interventions     No data to display

## 2022-03-27 NOTE — Evaluation (Signed)
Occupational Therapy Evaluation Patient Details Name: Caitlin Leonard MRN: [id number] DOB: [birth year] Today's Date: 03/27/2022   History of Present Illness 76 y.o. female with medical history significant of hypertension, collagenous colitis, depression, GERD and CAD was brought to ED via EMS 2 days after a fall when she tripped over a carpet, left foot was caught under a piece of furniture.  Continued to have worsening left foot pain.  Found to have L navicular avulsion fracture and likely Lisfranc injury.   Clinical Impression   Chart reviewed, RN cleared pt for participation in OT evaluation. PTA pt was a household amb with rollator, using BSC near bed, husband assisted with all IADL. At this time pt presents with deficits in strength, endurance, activity tolerance all affecting safe and optimal ADL completion. Bed mobility completed with CGA-MIN A, pt declines to attempt additional STS on this date after attempting earlier with PT. Seated grooming tasks completed with SET UP at edge of bed. Pt will benefit from additional STR to address functional deficits and to facilitate safe and optimal ADL completion. Pt is left in bed, NAD, all needs met. OT will continue to follow acutely.      Recommendations for follow up therapy are one component of a multi-disciplinary discharge planning process, led by the attending physician.  Recommendations may be updated based on patient status, additional functional criteria and insurance authorization.   Follow Up Recommendations  Skilled nursing-short term rehab (<3 hours/day)    Assistance Recommended at Discharge Frequent or constant Supervision/Assistance  Patient can return home with the following Two people to help with walking and/or transfers;Two people to help with bathing/dressing/bathroom    Functional Status Assessment  Patient has had a recent decline in their functional status and demonstrates the ability to make significant improvements in  function in a reasonable and predictable amount of time.  Equipment Recommendations  Other (comment) (per next venue of care)    Recommendations for Other Services       Precautions / Restrictions Precautions Precautions: Fall Restrictions Weight Bearing Restrictions: Yes LLE Weight Bearing: Non weight bearing Other Position/Activity Restrictions: per surgeon she could minimally bear weight through heel if absolutely necessary      Mobility Bed Mobility Overal bed mobility: Needs Assistance Bed Mobility: Supine to Sit, Sit to Supine     Supine to sit: Min guard, HOB elevated Sit to supine: HOB elevated, Min assist   General bed mobility comments: MIN A for management of LLE back into bed    Transfers                   General transfer comment: pt declined additional attempt STS on this date      Balance Overall balance assessment: Needs assistance Sitting-balance support: Bilateral upper extremity supported Sitting balance-Leahy Scale: Good                                     ADL either performed or assessed with clinical judgement   ADL Overall ADL's : Needs assistance/impaired Eating/Feeding: Sitting;Set up   Grooming: Sitting;Wash/dry face;Brushing hair;Set up Grooming Details (indicate cue type and reason): at edge of bed     Lower Body Bathing: Maximal assistance Lower Body Bathing Details (indicate cue type and reason): anticipated  Vision Patient Visual Report: Other (comment) (pt reports blurriness/double vision from previous stroke history)       Perception     Praxis      Pertinent Vitals/Pain Pain Assessment Pain Assessment: Faces Faces Pain Scale: Hurts little more Pain Location: L foot Pain Descriptors / Indicators: Grimacing Pain Intervention(s): Limited activity within patient's tolerance, Monitored during session, Repositioned     Hand Dominance     Extremity/Trunk  Assessment Upper Extremity Assessment Upper Extremity Assessment: Generalized weakness   Lower Extremity Assessment Lower Extremity Assessment: Generalized weakness   Cervical / Trunk Assessment Cervical / Trunk Assessment: Kyphotic   Communication Communication Communication: No difficulties   Cognition Arousal/Alertness: Awake/alert Behavior During Therapy: WFL for tasks assessed/performed Overall Cognitive Status: Within Functional Limits for tasks assessed                                       General Comments  Pt does not have the UE strength to effectively scoot in bed much less attempt standing safely    Exercises Other Exercises Other Exercises: edu re: role of OT, role of rehab, discharge recommendations, importance of oob mobility (sitting on EOB for meal times)   Shoulder Instructions      Home Living Family/patient expects to be discharged to:: Assisted living                             Home Equipment: Rollator (4 wheels);Shower seat;BSC/3in1   Additional Comments: pt lives in [place]      Prior Functioning/Environment               Mobility Comments: amb household distances with rollator ADLs Comments: dressing with assist, showers on shower chair with husband to assist with drying off; assist for IADL (husband assists with cooking/cleaning)        OT Problem List: Decreased strength;Decreased activity tolerance;Decreased knowledge of precautions      OT Treatment/Interventions: Self-care/ADL training;Therapeutic exercise;Patient/family education;DME and/or AE instruction;Therapeutic activities;Balance training    OT Goals(Current goals can be found in the care plan section) Acute Rehab OT Goals Patient Stated Goal: go to rehab OT Goal Formulation: With patient/family Time For Goal Achievement: 04/10/22 Potential to Achieve Goals: Good ADL Goals Pt Will Perform Lower Body Dressing: with set-up Pt Will  Transfer to Toilet: with mod assist;stand pivot transfer Pt Will Perform Toileting - Clothing Manipulation and hygiene: with mod assist  OT Frequency:      Co-evaluation              AM-PAC OT "6 Clicks" Daily Activity     Outcome Measure Help from another person eating meals?: None Help from another person taking care of personal grooming?: None Help from another person toileting, which includes using toliet, bedpan, or urinal?: A Lot Help from another person bathing (including washing, rinsing, drying)?: A Lot Help from another person to put on and taking off regular upper body clothing?: A Little Help from another person to put on and taking off regular lower body clothing?: A Lot 6 Click Score: 17   End of Session Nurse Communication: Mobility status  Activity Tolerance: Patient tolerated treatment well Patient left: in bed;with call bell/phone within reach;with bed alarm set;with family/visitor present  OT Visit Diagnosis: Muscle weakness (generalized) (M62.81)  Time: 1694-5038 OT Time Calculation (min): 23 min Charges:  OT General Charges $OT Visit: 1 Visit OT Evaluation $OT Eval Moderate Complexity: 1 Mod Shanon Payor, OTD OTR/L  03/27/22, 1:04 PM

## 2022-03-27 NOTE — Consult Note (Signed)
Reason for Consult: Multiple fractures with recent fall left foot. Referring Physician:Amin  Caitlin Leonard is an 76 y.o. female.  HPI: This is a 76 year old female with recent fall where she sustained a laceration on the right leg and caught her left foot under a piece of furniture.  Inability to bear any weight on the foot.  Presented to the emergency department where she was diagnosed with multiple fractures and probable Lisfranc injury throughout the midfoot.  Splinted and admitted for observation.  Past Medical History:  Diagnosis Date   Anemia    Anxiety    Arthritis    osteoarthritis   Asthma    due to seasonal alllergies   Atrial fibrillation (HCC)    Carpal tunnel syndrome    Cataract    Cervicalgia    Collagenous colitis 2010   COPD (chronic obstructive pulmonary disease) (HCC)    Depression    situational   Diabetes mellitus without complication (HCC)    Dysrhythmia    GERD (gastroesophageal reflux disease)    GI bleed    Headache(784.0)    migraines; 2 x month   Heart murmur    MVP ( symptomatic)   History of IBS    Hyperlipemia    Hypertension    on medication x 10 years   Migraine    MVP (mitral valve prolapse)    Shortness of breath    exertional   Sinoatrial node dysfunction ([place])    Sleep apnea 2007   does not use CPAP since 40 # wt loss   Stroke [facility])    Syncopal episodes     Past Surgical History:  Procedure Laterality Date   ABDOMINAL HYSTERECTOMY  1979   BACK SURGERY     BREAST CYST ASPIRATION Right 25 plus yrs ago   CARDIAC CATHETERIZATION  2012   [place]  2011   COLONOSCOPY WITH PROPOFOL N/A 03/25/2019   Procedure: COLONOSCOPY WITH PROPOFOL;  Surgeon: Lollie Sails, MD;  Location: [facility] ENDOSCOPY;  Service: Endoscopy;  Laterality: N/A;   COLONOSCOPY WITH PROPOFOL N/A 11/29/2020   Procedure: COLONOSCOPY WITH PROPOFOL;  Surgeon: Lesly Rubenstein, MD;  Location: [facility] ENDOSCOPY;  Service: Endoscopy;  Laterality: N/A;    ESOPHAGOGASTRODUODENOSCOPY (EGD) WITH PROPOFOL N/A 03/25/2019   Procedure: ESOPHAGOGASTRODUODENOSCOPY (EGD) WITH PROPOFOL;  Surgeon: Lollie Sails, MD;  Location: [facility] ENDOSCOPY;  Service: Endoscopy;  Laterality: N/A;   ESOPHAGOGASTRODUODENOSCOPY (EGD) WITH PROPOFOL N/A 11/29/2020   Procedure: ESOPHAGOGASTRODUODENOSCOPY (EGD) WITH PROPOFOL;  Surgeon: Lesly Rubenstein, MD;  Location: [facility] ENDOSCOPY;  Service: Endoscopy;  Laterality: N/A;   FLEXIBLE BRONCHOSCOPY N/A 10/10/2016   Procedure: FLEXIBLE BRONCHOSCOPY;  Surgeon: [name], MD;  Location: [facility] ORS;  Service: Pulmonary;  Laterality: N/A;   FLEXIBLE BRONCHOSCOPY N/A 12/11/2017   Procedure: FLEXIBLE BRONCHOSCOPY;  Surgeon: [name], MD;  Location: [facility] ORS;  Service: Pulmonary;  Laterality: N/A;   JOINT REPLACEMENT     KNEE ARTHROSCOPY  1985, 1990, 2012 x 2   bilateral   LUMBAR LAMINECTOMY  [id number]   NASAL SINUS SURGERY  1994   TONSILLECTOMY     TOTAL KNEE ARTHROPLASTY  12/22/2011   Procedure: TOTAL KNEE ARTHROPLASTY;  Surgeon: Rudean Haskell, MD;  Location: [place];  Service: Orthopedics;  Laterality: Left;    Family History  Problem Relation Age of Onset   Hypertension Mother    Hyperlipidemia Mother    Diabetes Mother    Hypertension Father    Hyperlipidemia Father  Arrhythmia Father        A-Fib   Diabetes Paternal Uncle    Diabetes Paternal Grandfather    Breast cancer Maternal Aunt        great in late 55's   Anesthesia problems Neg Hx    Colon cancer Neg Hx    Stomach cancer Neg Hx    Rectal cancer Neg Hx    Liver cancer Neg Hx    Esophageal cancer Neg Hx     Social History:  reports that she has never smoked. She has never used smokeless tobacco. She reports current alcohol use of about 1.0 standard drink of alcohol per week. She reports that she does not use drugs.  Allergies:  Allergies  Allergen Reactions   Contrast Media [Iodinated Contrast Media] Anaphylaxis   Gadolinium Derivatives  Anaphylaxis   Gadoversetamide Anaphylaxis   Shellfish Allergy Nausea And Vomiting and Other (See Comments)    Can eat shrimp but not oysters Dizziness,severe nausea and vomiting ( can eat shrimp CAN NOT EAT ORYSTERS) Other reaction(s): Other (See Comments) Dizziness,severe nausea and vomiting ( can eat shrimp CAN NOT EAT ORYSTERS)  Can eat shrimp but not oysters Dizziness,severe nausea and vomiting ( can eat shrimp CAN NOT EAT ORYSTERS)  Can eat shrimp but not oysters    Sulfa Antibiotics Hives and Other (See Comments)    GI upset GI upset GI upset  GI upset GI upset GI upset Other reaction(s): Other (See Comments) GI upset GI upset  GI upset GI upset    Hydroxychloroquine Sulfate Hives and Other (See Comments)    Severe hives, all over like chicken pox.   Banana Nausea And Vomiting and Other (See Comments)    Raw bananas   Limonene Other (See Comments)    GI upset GI upset    Morphine And Related    Nabumetone Other (See Comments)    Hypertension   Otezla [Apremilast]    Potassium-Containing Compounds Other (See Comments)    GI upset   Prednisone Nausea And Vomiting   Sulfasalazine Hives and Other (See Comments)    GI upset   Sulfonamide Derivatives Hives   Metronidazole Rash   Sulfamethoxazole Rash    Medications: Scheduled:  ALPRAZolam  0.5 mg Oral QHS   aspirin  81 mg Oral Daily   budesonide  9 mg Oral Daily   enoxaparin (LOVENOX) injection  0.5 mg/kg Subcutaneous Q24H   loratadine  10 mg Oral Daily   metoprolol tartrate  50 mg Oral BID   mirtazapine  30 mg Oral QHS   pantoprazole  40 mg Oral Daily   polyvinyl alcohol  1 drop Both Eyes BID   rosuvastatin  40 mg Oral Daily   sertraline  50 mg Oral QHS   sodium chloride flush  3 mL Intravenous Q12H    Results for orders placed or performed during the hospital encounter of 03/26/22 (from the past 48 hour(s))  CBC     Status: Abnormal   Collection Time: 03/26/22  3:08 PM  Result Value Ref Range    WBC 5.9 4.0 - 10.5 K/uL   RBC 4.13 3.87 - 5.11 MIL/uL   Hemoglobin 13.1 12.0 - 15.0 g/dL   HCT 41.4 36.0 - 46.0 %   MCV 100.2 (H) 80.0 - 100.0 fL   MCH 31.7 26.0 - 34.0 pg   MCHC 31.6 30.0 - 36.0 g/dL   RDW 13.0 11.5 - 15.5 %   Platelets 78 (L) 150 - 400 K/uL  Comment: Immature Platelet Fraction may be clinically indicated, consider ordering this additional test [id number] REPEATED TO VERIFY    nRBC 0.0 0.0 - 0.2 %    Comment: Performed at [facility], [address] [place], [place] [address]  Creatinine, serum     Status: None   Collection Time: 03/26/22  3:08 PM  Result Value Ref Range   Creatinine, Ser 0.63 0.44 - 1.00 mg/dL   GFR, Estimated >60 >60 mL/min    Comment: (NOTE) Calculated using the CKD-EPI Creatinine Equation (2021) Performed at [facility], [address] [place], [place] [address]   Glucose, capillary     Status: Abnormal   Collection Time: 03/27/22  7:36 AM  Result Value Ref Range   Glucose-Capillary 111 (H) 70 - 99 mg/dL    Comment: Glucose reference range applies only to samples taken after fasting for at least 8 hours.    CT FOOT LEFT WO CONTRAST  Result Date: 03/26/2022 CLINICAL DATA:  Fall.  Unable to bear weight. EXAM: CT OF THE LEFT FOOT WITHOUT CONTRAST TECHNIQUE: Multidetector CT imaging of the left foot was performed according to the standard protocol. Multiplanar CT image reconstructions were also generated. RADIATION DOSE REDUCTION: This exam was performed according to the departmental dose-optimization program which includes automated exposure control, adjustment of the mA and/or kV according to patient size and/or use of iterative reconstruction technique. COMPARISON:  Left foot x-rays from same day. FINDINGS: Bones/Joint/Cartilage Acute nondisplaced fracture of the medial cuneiform (series 6, image 45). Acute nondisplaced fracture of the third proximal metatarsal shaft (series 6, image 54). Small acute avulsion fracture  fragments between the medial cuneiform and base of the second metatarsal (series 4, image 74). Tiny acute avulsion fracture at the plantar base of the first metatarsal (series 4, image 77). Tiny acute avulsion fracture at the plantar base of the fourth metatarsal (series 4, image 83). Slight offset of the first TMT joint dorsally (series 6, image 43). No dislocation. Old healed fracture of the fifth metatarsal. Mild first MTP joint osteoarthritis. Osteopenia. No joint effusion. Ligaments Ligaments are suboptimally evaluated by CT. Muscles and Tendons Grossly intact.  Atrophy of the intrinsic foot muscles. Soft tissue Mild dorsal midfoot soft tissue swelling. No fluid collection or hematoma. No soft tissue mass. IMPRESSION: 1. Multiple small acute midfoot fractures as described above with evidence of Lisfranc ligament injury. Electronically Signed   By: Titus Dubin M.D.   On: 03/26/2022 13:44   CT TIBIA FIBULA LEFT WO CONTRAST  Result Date: 03/26/2022 CLINICAL DATA:  Fall.  Unable to bear weight. EXAM: CT OF THE LOWER LEFT EXTREMITY WITHOUT CONTRAST TECHNIQUE: Multidetector CT imaging of the left lower leg was performed according to the standard protocol. RADIATION DOSE REDUCTION: This exam was performed according to the departmental dose-optimization program which includes automated exposure control, adjustment of the mA and/or kV according to patient size and/or use of iterative reconstruction technique. COMPARISON:  Left tibia and fibula x-rays from same day. FINDINGS: Bones/Joint/Cartilage No acute fracture or dislocation. Old healed fracture of the proximal fibular diaphysis. Prior left total knee arthroplasty. No evidence of hardware failure or loosening. The left ankle is intact. No joint effusion. Ligaments Ligaments are suboptimally evaluated by CT. Muscles and Tendons Grossly intact. Soft tissue No fluid collection or hematoma.  No soft tissue mass. IMPRESSION: 1. No acute osseous abnormality. 2.  Old healed fracture of the proximal fibular diaphysis. 3. Prior left total knee arthroplasty without evidence of hardware complication. Electronically Signed  By: Titus Dubin M.D.   On: 03/26/2022 13:30   DG Hip Unilat W or Wo Pelvis 2-3 Views Left  Result Date: 03/26/2022 CLINICAL DATA:  Fall 2 days ago EXAM: DG HIP (WITH OR WITHOUT PELVIS) 2-3V LEFT COMPARISON:  None Available. FINDINGS: There is no evidence of hip fracture or dislocation. There is no evidence of arthropathy or other focal bone abnormality. IMPRESSION: Negative. Electronically Signed   By: Franchot Gallo M.D.   On: 03/26/2022 11:48   DG Tibia/Fibula Left  Result Date: 03/26/2022 CLINICAL DATA:  Fall 2 days ago.  Unable to bear weight. EXAM: LEFT TIBIA AND FIBULA - 2 VIEW COMPARISON:  Left knee 12/30/2014 FINDINGS: Left knee replacement in satisfactory position and alignment. Chronic fracture proximal left fibula. Negative for acute fracture. IMPRESSION: Negative for acute fracture. Electronically Signed   By: Franchot Gallo M.D.   On: 03/26/2022 11:47   DG Ankle Complete Left  Result Date: 03/26/2022 CLINICAL DATA:  Fall EXAM: LEFT ANKLE COMPLETE - 3 VIEW COMPARISON:  None Available. FINDINGS: There is no evidence of fracture, dislocation, or joint effusion. There is no evidence of arthropathy or other focal bone abnormality. Soft tissue edema of the anterior ankle. Vascular calcifications. IMPRESSION: No evidence of ankle fracture or dislocation. Electronically Signed   By: Yetta Glassman M.D.   On: 03/26/2022 10:08   DG Foot Complete Left  Result Date: 03/26/2022 CLINICAL DATA:  Status post fall.  Injury 2 days ago. EXAM: LEFT FOOT - COMPLETE 3+ VIEW COMPARISON:  None FINDINGS: There is mild diffuse osteopenia. Diffuse soft tissue swelling is identified. Remote healed fracture deformity is noted involving the distal aspect of the fifth metatarsal bone. No signs of acute fracture or dislocation. Mild degenerative change  noted at the first MTP joint. Second through fourth hammertoe deformities are identified. IMPRESSION: 1. No acute findings. 2. Remote healed fracture deformity involving the distal aspect of the fifth metatarsal bone. 3. Diffuse soft tissue swelling. Electronically Signed   By: Kerby Moors M.D.   On: 03/26/2022 10:07    Review of Systems  Constitutional:  Negative for chills and fever.  HENT:  Negative for sinus pain and sore throat.   Respiratory:  Negative for cough and shortness of breath.   Cardiovascular:  Negative for chest pain and palpitations.  Gastrointestinal:  Negative for nausea and vomiting.  Genitourinary:  Negative for frequency and urgency.  Musculoskeletal:        Pain in the left foot from recent fall with multiple fractures.  Skin:        Patient relates some swelling and bruising in her left foot with a laceration on her right leg from her recent fall.  Neurological:  Negative for numbness.  Psychiatric/Behavioral:  Negative for confusion. The patient is not nervous/anxious.    Blood pressure (!) 95/48, pulse 74, temperature (!) 97.1 F (36.2 C), resp. rate 19, height _0  (1.727 m), weight 113.4 kg, SpO2 91 %. Physical Exam Cardiovascular:     Comments: Splint intact on the left foot so pulses were not assessed. Musculoskeletal:     Comments: Splinting on the left lower extremity.  Some pain on palpation around the midfoot area.  Skin:    Comments: Splint and dressing intact on the left foot.  Was not removed.  Neurological:     Comments: Sensation appears to be grossly intact.     Assessment/Plan: Assessment: Multiple nondisplaced fractures left foot with probable Lisfranc injury.  Plan: Patient currently splinted.  Discussed continuing in the splint with nonweightbearing on the left lower extremity.  If needed she may be have touchdown weightbearing on the heel only for transfer using walker.  Plan for follow-up in 3 weeks for x-ray at which point we will  hopefully progress to a walking cam boot.  Durward Fortes 03/27/2022, 8:29 AM

## 2022-03-27 NOTE — TOC Progression Note (Signed)
Transition of Care [facility]) - Progression Note    Patient Details  Name: Caitlin Leonard MRN: [id number] Date of Birth: 24-Mar-1946  Transition of Care [facility]) CM/SW [place], RN Phone Number: 03/27/2022, 11:57 AM  Clinical Narrative:     Spoke with the patient and her husband at the bedside [facility] lives in independent living at [place] and plans to go to [place] [facility], I spoke with Seth Bake and confirmed Will need to get insurance approval  Expected Discharge Plan: [place] Barriers to Discharge: Continued Medical Work up, Orthoptist and Services Expected Discharge Plan: [place]   Discharge Planning Services: CM Consult   Living arrangements for the past 2 months: Apartment                                       Social Determinants of Health (SDOH) Interventions    Readmission Risk Interventions     No data to display

## 2022-03-27 NOTE — NC FL2 (Signed)
MEDICAID FL2 LEVEL OF CARE SCREENING TOOL     IDENTIFICATION  Patient Name: Caitlin Leonard Birthdate: 1945/11/18 Sex: female Admission Date (Current Location): 03/26/2022  [facility] and [place] Number:  Chiropodist and Address:  [facility], [address], [place], [place] [address]      Provider Number: [id number]  Attending Physician Name and Address:  Lynn Ito, MD  Relative Name and Phone Number:  Gery Pray, SPouse    Current Level of Care: Hospital Recommended Level of Care: Skilled Nursing Facility Prior Approval Number:    Date Approved/Denied:   PASRR Number: [id number]  Discharge Plan: SNF    Current Diagnoses: Patient Active Problem List   Diagnosis Date Noted   Avulsion fracture of navicular bone of left foot 03/26/2022   Parkinson's disease (HCC) 11/28/2020   Depression with anxiety    COPD (chronic obstructive pulmonary disease) (HCC)    Hyperlipemia    Atrial fibrillation (HCC)    Stroke (HCC)    Collagenous colitis    Essential hypertension 03/19/2016   Obesity 09/10/2015   OSA (obstructive sleep apnea) 10/18/2014   Nodule of right lung 06/14/2014   Dyspnea 06/13/2014   Coronary artery calcification 04/17/2014   Aneurysm, ascending aorta (HCC) 04/17/2014   Chronic cough 02/24/2014   Mold suspected exposure 02/24/2014    Orientation RESPIRATION BLADDER Height & Weight     Self, Time, Situation, Place  Normal Continent, External catheter Weight: 113.4 kg Height:  5\' 8"  (172.7 cm)  BEHAVIORAL SYMPTOMS/MOOD NEUROLOGICAL BOWEL NUTRITION STATUS      Continent Diet (See DC summary)  AMBULATORY STATUS COMMUNICATION OF NEEDS Skin   Extensive Assist (non wieght bearing left foot) Verbally Normal                       Personal Care Assistance Level of Assistance  Bathing, Feeding, Dressing Bathing Assistance: Limited assistance Feeding assistance: Independent Dressing Assistance: Limited  assistance     Functional Limitations Info             SPECIAL CARE FACTORS FREQUENCY  PT (By licensed PT), OT (By licensed OT)     PT Frequency: 5 times per week OT Frequency: 5 times per week            Contractures Contractures Info: Not present    Additional Factors Info  Code Status, Allergies Code Status Info: DNR Allergies Info: Contrast Media (Iodinated Contrast Media), Gadolinium Derivatives, Gadoversetamide, Shellfish Allergy, Sulfa Antibiotics, Hydroxychloroquine Sulfate, Banana, Limonene, Morphine And Related, Nabumetone, Otezla (Apremilast), Potassium-containing Compounds, Prednisone, Sulfasalazine, Sulfonamide Derivatives, Metronidazole, Sulfamethoxazole           Current Medications (03/27/2022):  This is the current hospital active medication list Current Facility-Administered Medications  Medication Dose Route Frequency Provider Last Rate Last Admin   acetaminophen (TYLENOL) tablet 650 mg  650 mg Oral Q6H PRN Arnetha Courser, MD   650 mg at 03/26/22 2005   Or   acetaminophen (TYLENOL) suppository 650 mg  650 mg Rectal Q6H PRN Arnetha Courser, MD       ALPRAZolam Prudy Feeler) tablet 0.5 mg  0.5 mg Oral QHS Arnetha Courser, MD   0.5 mg at 03/26/22 2136   aspirin chewable tablet 81 mg  81 mg Oral Daily Arnetha Courser, MD   81 mg at 03/27/22 1100   budesonide (ENTOCORT EC) 24 hr capsule 9 mg  9 mg Oral Daily Arnetha Courser, MD   9 mg at 03/27/22 1100  enoxaparin (LOVENOX) injection 57.5 mg  0.5 mg/kg Subcutaneous Q24H Arnetha Courser, MD   57.5 mg at 03/26/22 2136   HYDROcodone-acetaminophen (NORCO/VICODIN) 5-325 MG per tablet 1-2 tablet  1-2 tablet Oral Q4H PRN Arnetha Courser, MD       HYDROmorphone (DILAUDID) injection 0.5 mg  0.5 mg Intravenous Q4H PRN Arnetha Courser, MD   0.5 mg at 03/27/22 0414   loratadine (CLARITIN) tablet 10 mg  10 mg Oral Daily Arnetha Courser, MD   10 mg at 03/27/22 1100   metoprolol tartrate (LOPRESSOR) tablet 50 mg  50 mg Oral BID Arnetha Courser, MD    50 mg at 03/26/22 2136   mirtazapine (REMERON) tablet 30 mg  30 mg Oral QHS Arnetha Courser, MD   30 mg at 03/26/22 2136   ondansetron (ZOFRAN) tablet 4 mg  4 mg Oral Q6H PRN Arnetha Courser, MD       Or   ondansetron (ZOFRAN) injection 4 mg  4 mg Intravenous Q6H PRN Arnetha Courser, MD       pantoprazole (PROTONIX) EC tablet 40 mg  40 mg Oral Daily Arnetha Courser, MD   40 mg at 03/27/22 1100   polyethylene glycol (MIRALAX / GLYCOLAX) packet 17 g  17 g Oral Daily PRN Arnetha Courser, MD       polyvinyl alcohol (LIQUIFILM TEARS) 1.4 % ophthalmic solution 1 drop  1 drop Both Eyes BID Arnetha Courser, MD   1 drop at 03/27/22 1100   rosuvastatin (CRESTOR) tablet 40 mg  40 mg Oral Daily Arnetha Courser, MD   40 mg at 03/26/22 2136   sertraline (ZOLOFT) tablet 50 mg  50 mg Oral QHS Arnetha Courser, MD   50 mg at 03/26/22 2136   sodium chloride flush (NS) 0.9 % injection 3 mL  3 mL Intravenous Q12H Arnetha Courser, MD   3 mL at 03/27/22 1100     Discharge Medications: Please see discharge summary for a list of discharge medications.  Relevant Imaging Results:  Relevant Lab Results:   Additional Information SS# [id number]  Marlowe Sax, RN

## 2022-03-27 NOTE — Evaluation (Signed)
Physical Therapy Evaluation Patient Details Name: Caitlin Leonard MRN: [id number] DOB: 11-May-1946 Today's Date: 03/27/2022  History of Present Illness  76 y.o. female with medical history significant of hypertension, collagenous colitis, depression, GERD and CAD was brought to ED via EMS 2 days after a fall when she tripped over a carpet, left foot was caught under a piece of furniture.  Continued to have worsening left foot pain.  Found to have L navicular avulsion fracture and likely Lisfranc injury.  Clinical Impression  Pt pleasant and motivated to work with PT but, ultimately, she was very functionally limited and did not have the strength to side scoot EOB (neither direction with bed low and elevated).  Attempts to squat pivot to recliner (with arm rail dropped) and later to stand from elevated bed with +2 max assist were unsuccessful.  She understands that it is important to keep weight off the L and acknowledges that she will need STR as she is unsafe/unable to mobilize safely in the home.       Recommendations for follow up therapy are one component of a multi-disciplinary discharge planning process, led by the attending physician.  Recommendations may be updated based on patient status, additional functional criteria and insurance authorization.  Follow Up Recommendations Skilled nursing-short term rehab (<3 hours/day) Can patient physically be transported by private vehicle: No    Assistance Recommended at Discharge Frequent or constant Supervision/Assistance  Patient can return home with the following  Two people to help with walking and/or transfers;A lot of help with bathing/dressing/bathroom;Assistance with cooking/housework;Assist for transportation;Help with stairs or ramp for entrance    Equipment Recommendations Rolling walker (2 wheels)  Recommendations for Other Services       Functional Status Assessment Patient has had a recent decline in their functional status and  demonstrates the ability to make significant improvements in function in a reasonable and predictable amount of time.     Precautions / Restrictions Precautions Precautions: Fall Restrictions Weight Bearing Restrictions: Yes LLE Weight Bearing: Non weight bearing Other Position/Activity Restrictions: per surgeon she could minimally bear weight through heel if absolutely necessary      Mobility  Bed Mobility Overal bed mobility: Needs Assistance Bed Mobility: Supine to Sit, Sit to Supine     Supine to sit: Min guard Sit to supine: Min assist   General bed mobility comments: Pt was able to roll to side and, using bed rails, get herself up to sitting EOB.  Able to lift LEs nearly back into bed but light assist to clear EOB as well as direct assist to scoot to center and head of the bed    Transfers Overall transfer level: Needs assistance Equipment used: Rolling walker (2 wheels) Transfers: Sit to/from Stand Sit to Stand: Max assist, +2 physical assistance           General transfer comment: Pt showed good effort but simply was too weak to initiate upward movement and despite +2 max assist struggled with all aspects of getting to standing.  She appeared to show good effort keeping L foot NWBing but could not keep it off the floor, struggled to effectively use UEs to rise or hold walker and despite +2 max assist could not maintain standing safely.  Deferred further mobility.    Ambulation/Gait               General Gait Details: unable to effectively stand  Science writer  Modified Rankin (Stroke Patients Only)       Balance Overall balance assessment: Needs assistance Sitting-balance support: Bilateral upper extremity supported Sitting balance-Leahy Scale: Good     Standing balance support: Bilateral upper extremity supported Standing balance-Leahy Scale: Zero                               Pertinent Vitals/Pain  Pain Assessment Pain Assessment: Faces Faces Pain Scale: Hurts little more Pain Location: L foot, in splint    Home Living Family/patient expects to be discharged to:: Assisted living                   Additional Comments: pt lives in [place]    Prior Function               Mobility Comments: Uses 4WW, apparently TKA ~10 years ago never recovered all that well, has some history of recurrent falls though apparently only this one in the last 6 months       Hand Dominance        Extremity/Trunk Assessment   Upper Extremity Assessment Upper Extremity Assessment: Generalized weakness (Pt with reasonable b/l tricep strength with testing but functionally unable to lift her weight to scoot in bed, perform bed pushups)    Lower Extremity Assessment Lower Extremity Assessment: Generalized weakness       Communication   Communication: No difficulties  Cognition Arousal/Alertness: Awake/alert Behavior During Therapy: WFL for tasks assessed/performed Overall Cognitive Status: Within Functional Limits for tasks assessed                                          General Comments General comments (skin integrity, edema, etc.): Pt does not have the UE strength to effectively scoot in bed much less attempt standing safely    Exercises     Assessment/Plan    PT Assessment Patient needs continued PT services  PT Problem List Decreased strength;Decreased range of motion;Decreased activity tolerance;Decreased balance;Decreased mobility;Decreased cognition;Decreased knowledge of use of DME;Decreased safety awareness;Pain       PT Treatment Interventions DME instruction;Gait training;Functional mobility training;Therapeutic activities;Therapeutic exercise;Balance training;Neuromuscular re-education;Patient/family education    PT Goals (Current goals can be found in the Care Plan section)  Acute Rehab PT Goals Patient Stated Goal: get back to where  she can put weight on L foot and walk PT Goal Formulation: With patient Time For Goal Achievement: 04/09/22 Potential to Achieve Goals: Good    Frequency 7X/week     Co-evaluation               AM-PAC PT "6 Clicks" Mobility  Outcome Measure Help needed turning from your back to your side while in a flat bed without using bedrails?: A Little Help needed moving from lying on your back to sitting on the side of a flat bed without using bedrails?: A Little Help needed moving to and from a bed to a chair (including a wheelchair)?: Total Help needed standing up from a chair using your arms (e.g., wheelchair or bedside chair)?: Total Help needed to walk in hospital room?: Total Help needed climbing 3-5 steps with a railing? : Total 6 Click Score: 10    End of Session Equipment Utilized During Treatment: Gait belt Activity Tolerance: Patient limited by fatigue Patient left: with bed alarm set;with call  bell/phone within reach;with family/visitor present Nurse Communication: Mobility status PT Visit Diagnosis: Muscle weakness (generalized) (M62.81);Difficulty in walking, not elsewhere classified (R26.2)    Time: 3151-7616 PT Time Calculation (min) (ACUTE ONLY): 29 min   Charges:   PT Evaluation $PT Eval Low Complexity: 1 Low PT Treatments $Therapeutic Activity: 8-22 mins        Kreg Shropshire, DPT 03/27/2022, 12:48 PM

## 2022-03-27 NOTE — Progress Notes (Signed)
PROGRESS NOTE    Caitlin Leonard  HDQ:[id number] DOB: 30-Dec-1945 DOA: 03/26/2022 PCP: Maryland Pink, MD    Brief Narrative:  Caitlin Leonard is a 76 y.o. female with medical history significant of hypertension, collagenous colitis, depression, GERD and CAD was brought to ED via EMS after having a mechanical fall 2 days ago when she tripped over a carpet resulted in laceration of right lower extremity which were bandaged and her left foot was caught under a piece of furniture.  Continued to have worsening left foot pain and unable to bear any weight.  Hemodynamically stable.  Labs pretty much unremarkable except platelet of 78.  Left foot x-ray was without any significant abnormality.  CT left foot with Multiple small acute midfoot fractures as described above with evidence of Lisfranc ligament injury. Podiatry was consulted and there is no plan for any surgical intervention.  They advised splinting and nonweightbearing.  6/22 podiatry saw pt.    Consultants:  podiatry  Procedures:   Antimicrobials:      Subjective: Has pain. Unable to walk without placing weight on foot. C/o constipation  Objective: Vitals:   03/26/22 1526 03/26/22 1852 03/27/22 0405 03/27/22 0742  BP:  (!) 123/52 (!) 143/70 (!) 95/48  Pulse: 71 77 88 74  Resp:  18 19   Temp:  98.7 F (37.1 C) (!) 97.5 F (36.4 C) (!) 97.1 F (36.2 C)  TempSrc:      SpO2: 93% 100% 95% 91%  Weight:      Height:       No intake or output data in the 24 hours ending 03/27/22 0919 Filed Weights   03/26/22 0914  Weight: 113.4 kg    Examination: Calm, NAD Cta no w/r Reg s1/s2 no gallop Soft benign +bs No edema Aaoxox3  Mood and affect appropriate in current setting     Data Reviewed: I have personally reviewed following labs and imaging studies  CBC: Recent Labs  Lab 03/26/22 1508  WBC 5.9  HGB 13.1  HCT 41.4  MCV 100.2*  PLT 78*   Basic Metabolic Panel: Recent Labs  Lab 03/26/22 1508   CREATININE 0.63   GFR: Estimated Creatinine Clearance: 81.5 mL/min (by C-G formula based on SCr of 0.63 mg/dL). Liver Function Tests: No results for input(s): "AST", "ALT", "ALKPHOS", "BILITOT", "PROT", "ALBUMIN" in the last 168 hours. No results for input(s): "LIPASE", "AMYLASE" in the last 168 hours. No results for input(s): "AMMONIA" in the last 168 hours. Coagulation Profile: No results for input(s): "INR", "PROTIME" in the last 168 hours. Cardiac Enzymes: No results for input(s): "CKTOTAL", "CKMB", "CKMBINDEX", "TROPONINI" in the last 168 hours. BNP (last 3 results) No results for input(s): "PROBNP" in the last 8760 hours. HbA1C: No results for input(s): "HGBA1C" in the last 72 hours. CBG: Recent Labs  Lab 03/27/22 0736  GLUCAP 111*   Lipid Profile: No results for input(s): "CHOL", "HDL", "LDLCALC", "TRIG", "CHOLHDL", "LDLDIRECT" in the last 72 hours. Thyroid Function Tests: No results for input(s): "TSH", "T4TOTAL", "FREET4", "T3FREE", "THYROIDAB" in the last 72 hours. Anemia Panel: No results for input(s): "VITAMINB12", "FOLATE", "FERRITIN", "TIBC", "IRON", "RETICCTPCT" in the last 72 hours. Sepsis Labs: No results for input(s): "PROCALCITON", "LATICACIDVEN" in the last 168 hours.  No results found for this or any previous visit (from the past 240 hour(s)).       Radiology Studies: CT FOOT LEFT WO CONTRAST  Result Date: 03/26/2022 CLINICAL DATA:  Fall.  Unable to bear weight. EXAM: CT OF THE  LEFT FOOT WITHOUT CONTRAST TECHNIQUE: Multidetector CT imaging of the left foot was performed according to the standard protocol. Multiplanar CT image reconstructions were also generated. RADIATION DOSE REDUCTION: This exam was performed according to the departmental dose-optimization program which includes automated exposure control, adjustment of the mA and/or kV according to patient size and/or use of iterative reconstruction technique. COMPARISON:  Left foot x-rays from same  day. FINDINGS: Bones/Joint/Cartilage Acute nondisplaced fracture of the medial cuneiform (series 6, image 45). Acute nondisplaced fracture of the third proximal metatarsal shaft (series 6, image 54). Small acute avulsion fracture fragments between the medial cuneiform and base of the second metatarsal (series 4, image 74). Tiny acute avulsion fracture at the plantar base of the first metatarsal (series 4, image 77). Tiny acute avulsion fracture at the plantar base of the fourth metatarsal (series 4, image 83). Slight offset of the first TMT joint dorsally (series 6, image 43). No dislocation. Old healed fracture of the fifth metatarsal. Mild first MTP joint osteoarthritis. Osteopenia. No joint effusion. Ligaments Ligaments are suboptimally evaluated by CT. Muscles and Tendons Grossly intact.  Atrophy of the intrinsic foot muscles. Soft tissue Mild dorsal midfoot soft tissue swelling. No fluid collection or hematoma. No soft tissue mass. IMPRESSION: 1. Multiple small acute midfoot fractures as described above with evidence of Lisfranc ligament injury. Electronically Signed   By: Titus Dubin M.D.   On: 03/26/2022 13:44   CT TIBIA FIBULA LEFT WO CONTRAST  Result Date: 03/26/2022 CLINICAL DATA:  Fall.  Unable to bear weight. EXAM: CT OF THE LOWER LEFT EXTREMITY WITHOUT CONTRAST TECHNIQUE: Multidetector CT imaging of the left lower leg was performed according to the standard protocol. RADIATION DOSE REDUCTION: This exam was performed according to the departmental dose-optimization program which includes automated exposure control, adjustment of the mA and/or kV according to patient size and/or use of iterative reconstruction technique. COMPARISON:  Left tibia and fibula x-rays from same day. FINDINGS: Bones/Joint/Cartilage No acute fracture or dislocation. Old healed fracture of the proximal fibular diaphysis. Prior left total knee arthroplasty. No evidence of hardware failure or loosening. The left ankle is  intact. No joint effusion. Ligaments Ligaments are suboptimally evaluated by CT. Muscles and Tendons Grossly intact. Soft tissue No fluid collection or hematoma.  No soft tissue mass. IMPRESSION: 1. No acute osseous abnormality. 2. Old healed fracture of the proximal fibular diaphysis. 3. Prior left total knee arthroplasty without evidence of hardware complication. Electronically Signed   By: Titus Dubin M.D.   On: 03/26/2022 13:30   DG Hip Unilat W or Wo Pelvis 2-3 Views Left  Result Date: 03/26/2022 CLINICAL DATA:  Fall 2 days ago EXAM: DG HIP (WITH OR WITHOUT PELVIS) 2-3V LEFT COMPARISON:  None Available. FINDINGS: There is no evidence of hip fracture or dislocation. There is no evidence of arthropathy or other focal bone abnormality. IMPRESSION: Negative. Electronically Signed   By: Franchot Gallo M.D.   On: 03/26/2022 11:48   DG Tibia/Fibula Left  Result Date: 03/26/2022 CLINICAL DATA:  Fall 2 days ago.  Unable to bear weight. EXAM: LEFT TIBIA AND FIBULA - 2 VIEW COMPARISON:  Left knee 12/30/2014 FINDINGS: Left knee replacement in satisfactory position and alignment. Chronic fracture proximal left fibula. Negative for acute fracture. IMPRESSION: Negative for acute fracture. Electronically Signed   By: Franchot Gallo M.D.   On: 03/26/2022 11:47   DG Ankle Complete Left  Result Date: 03/26/2022 CLINICAL DATA:  Fall EXAM: LEFT ANKLE COMPLETE - 3 VIEW COMPARISON:  None  Available. FINDINGS: There is no evidence of fracture, dislocation, or joint effusion. There is no evidence of arthropathy or other focal bone abnormality. Soft tissue edema of the anterior ankle. Vascular calcifications. IMPRESSION: No evidence of ankle fracture or dislocation. Electronically Signed   By: Yetta Glassman M.D.   On: 03/26/2022 10:08   DG Foot Complete Left  Result Date: 03/26/2022 CLINICAL DATA:  Status post fall.  Injury 2 days ago. EXAM: LEFT FOOT - COMPLETE 3+ VIEW COMPARISON:  None FINDINGS: There is mild  diffuse osteopenia. Diffuse soft tissue swelling is identified. Remote healed fracture deformity is noted involving the distal aspect of the fifth metatarsal bone. No signs of acute fracture or dislocation. Mild degenerative change noted at the first MTP joint. Second through fourth hammertoe deformities are identified. IMPRESSION: 1. No acute findings. 2. Remote healed fracture deformity involving the distal aspect of the fifth metatarsal bone. 3. Diffuse soft tissue swelling. Electronically Signed   By: Kerby Moors M.D.   On: 03/26/2022 10:07        Scheduled Meds:  ALPRAZolam  0.5 mg Oral QHS   aspirin  81 mg Oral Daily   budesonide  9 mg Oral Daily   enoxaparin (LOVENOX) injection  0.5 mg/kg Subcutaneous Q24H   loratadine  10 mg Oral Daily   metoprolol tartrate  50 mg Oral BID   mirtazapine  30 mg Oral QHS   pantoprazole  40 mg Oral Daily   polyvinyl alcohol  1 drop Both Eyes BID   rosuvastatin  40 mg Oral Daily   sertraline  50 mg Oral QHS   sodium chloride flush  3 mL Intravenous Q12H   Continuous Infusions:  Assessment & Plan:   Principal Problem:   Avulsion fracture of navicular bone of left foot Active Problems:   Obesity   Essential hypertension   Collagenous colitis   Coronary artery calcification   Depression with anxiety   Avulsion fracture of navicular bone of left foot Secondary to fall.  Imaging with multiple fractures involving the midfoot. Podiatry consulted - pt splinted. Continue splint with nonweightbearing on the LLE.  Plan f/u 3 weeks for xray. Will ask podiatry about ortho boot for pt to walk on since feels has not much upper body strength to hold herself PT/OT rec. SNF   Obesity Estimated body mass index is 38.01 kg/m as calculated from the following:   Height as of this encounter: '5\' 8"'$  (1.727 m).   Weight as of this encounter: 113.4 kg.   4/23 will complicate overall px   Essential hypertension Stable , continue beta blk   Collagenous  colitis Continue budesonide   Constipation: Will add miralax as pt takes it daily at home Add sennokot too.    Coronary artery calcification No acute concern. -Continue home aspirin and Crestor   Depression with anxiety - Continue home dose of Remeron and Zoloft     DVT prophylaxis: lovenox Code Status:DNR Family Communication: husband at bedside Disposition Plan: SNF Status is: Observation The patient remains OBS appropriate and will d/c before 2 midnights.        LOS: 0 days   Time spent: 35 min    Nolberto Hanlon, MD [facility] Pager [phone]  If 7PM-7AM, please contact night-coverage 03/27/2022, 9:19 AM

## 2022-03-27 NOTE — Progress Notes (Signed)
   03/27/22 1255  Clinical Encounter Type  Visited With Patient  Visit Type Initial;Spiritual support;Social support  Spiritual Encounters  Spiritual Needs Prayer   Chaplain Sarrinah Gardin responded to consult request for prayer and support. Chaplain also contacted by pt's spouse, Alvester Chou.  Chaplain B offered compassionate, non-anxious presence. Chaplain B explored with pt what she is currently coping with and how that impacts issues already experienced (isolation, missing in-person worship, etc). Chaplain B invited reflection on how this connects with pt's faith and to explore how, based on what is most meaningful; to her, what ways to support her current needs (as well a in rehab in weeks ahead).  In addition to this meaning-making and reflection, Chaplai B encouraged pt to consider both practical steps that may alleviate issues (using facebook to reach out and connecting with church circle) to help encourage friends to visit. Also discussed role of grief (lost parents six years ago) and balancing needs of pt and spouse in rehab process.  Chaplain B offered prayer at pt request. Offered encouragement and let her know of ongoing spiritual support.

## 2022-03-27 NOTE — Plan of Care (Signed)
  Problem: Education: Goal: Knowledge of General Education information will improve Description: Including pain rating scale, medication(s)/side effects and non-pharmacologic comfort measures Outcome: Progressing   Problem: Clinical Measurements: Goal: Ability to maintain clinical measurements within normal limits will improve Outcome: Progressing   

## 2022-03-28 DIAGNOSIS — F418 Other specified anxiety disorders: Secondary | ICD-10-CM | POA: Diagnosis not present

## 2022-03-28 DIAGNOSIS — K52831 Collagenous colitis: Secondary | ICD-10-CM | POA: Diagnosis not present

## 2022-03-28 DIAGNOSIS — S92252A Displaced fracture of navicular [scaphoid] of left foot, initial encounter for closed fracture: Secondary | ICD-10-CM | POA: Diagnosis not present

## 2022-03-28 DIAGNOSIS — I2510 Atherosclerotic heart disease of native coronary artery without angina pectoris: Secondary | ICD-10-CM | POA: Diagnosis not present

## 2022-03-28 LAB — GLUCOSE, CAPILLARY: Glucose-Capillary: 125 mg/dL — ABNORMAL HIGH (ref 70–99)

## 2022-03-28 MED ORDER — LIDOCAINE 5 % EX PTCH
1.0000 | MEDICATED_PATCH | CUTANEOUS | Status: DC
  Administered 2022-03-28: 1 via TRANSDERMAL
  Filled 2022-03-28: qty 1

## 2022-03-28 MED ORDER — SENNOSIDES-DOCUSATE SODIUM 8.6-50 MG PO TABS: 1.0000 | ORAL_TABLET | Freq: Every day | ORAL | Status: DC

## 2022-03-28 MED ORDER — LIDOCAINE 5 % EX PTCH: 1.0000 | MEDICATED_PATCH | CUTANEOUS | 0 refills | Status: DC

## 2022-03-28 MED ORDER — ACETAMINOPHEN 325 MG PO TABS: 650.0000 mg | ORAL_TABLET | Freq: Four times a day (QID) | ORAL | Status: DC | PRN

## 2022-03-28 MED ORDER — HYDROCODONE-ACETAMINOPHEN 5-325 MG PO TABS: 1.0000 | ORAL_TABLET | ORAL | 0 refills | Status: AC | PRN

## 2022-03-28 MED ORDER — POLYETHYLENE GLYCOL 3350 17 G PO PACK: 17.0000 g | PACK | Freq: Two times a day (BID) | ORAL | 0 refills | Status: DC

## 2022-03-31 DIAGNOSIS — I480 Paroxysmal atrial fibrillation: Secondary | ICD-10-CM

## 2022-03-31 DIAGNOSIS — M48061 Spinal stenosis, lumbar region without neurogenic claudication: Secondary | ICD-10-CM | POA: Diagnosis not present

## 2022-03-31 DIAGNOSIS — S92252S Displaced fracture of navicular [scaphoid] of left foot, sequela: Secondary | ICD-10-CM | POA: Diagnosis not present

## 2022-03-31 DIAGNOSIS — J449 Chronic obstructive pulmonary disease, unspecified: Secondary | ICD-10-CM

## 2022-03-31 DIAGNOSIS — Z8673 Personal history of transient ischemic attack (TIA), and cerebral infarction without residual deficits: Secondary | ICD-10-CM | POA: Diagnosis not present

## 2022-03-31 DIAGNOSIS — E1159 Type 2 diabetes mellitus with other circulatory complications: Secondary | ICD-10-CM

## 2022-03-31 DIAGNOSIS — F39 Unspecified mood [affective] disorder: Secondary | ICD-10-CM | POA: Diagnosis not present

## 2022-03-31 DIAGNOSIS — I639 Cerebral infarction, unspecified: Secondary | ICD-10-CM

## 2022-04-03 LAB — BASIC METABOLIC PANEL
BUN: 18 (ref 4–21)
CO2: 29 — AB (ref 13–22)
Chloride: 94 — AB (ref 99–108)
Creatinine: 0.8 (ref 0.5–1.1)
Glucose: 133
Potassium: 4.4 mEq/L (ref 3.5–5.1)
Sodium: 128 — AB (ref 137–147)

## 2022-04-03 LAB — COMPREHENSIVE METABOLIC PANEL
Calcium: 9.5 (ref 8.7–10.7)
eGFR: 77

## 2022-04-10 LAB — CBC AND DIFFERENTIAL
HCT: 35 — AB (ref 36–46)
HCT: 35 — AB (ref 36–46)
Hemoglobin: 11.7 — AB (ref 12.0–16.0)
Hemoglobin: 11.7 — AB (ref 12.0–16.0)
Neutrophils Absolute: 2688
Neutrophils Absolute: 2688
Platelets: 149 10*3/uL — AB (ref 150–400)
Platelets: 149 10*3/uL — AB (ref 150–400)
WBC: 4
WBC: 4

## 2022-04-10 LAB — BASIC METABOLIC PANEL
BUN: 18 (ref 4–21)
BUN: 18 (ref 4–21)
CO2: 31 — AB (ref 13–22)
CO2: 31 — AB (ref 13–22)
Chloride: 97 — AB (ref 99–108)
Chloride: 97 — AB (ref 99–108)
Creatinine: 0.6 (ref 0.5–1.1)
Creatinine: 0.6 (ref <=1.1)
Glucose: 125
Glucose: 125
Potassium: 4.1 mEq/L (ref 3.5–5.1)
Potassium: 4.1 mEq/L (ref 3.5–5.1)
Sodium: 131 — AB (ref 137–147)
Sodium: 131 — AB (ref 137–147)

## 2022-04-10 LAB — COMPREHENSIVE METABOLIC PANEL
Calcium: 9.5 (ref 8.7–10.7)
Calcium: 9.5 (ref 8.7–10.7)
eGFR: 93

## 2022-04-10 LAB — CBC
RBC: 3.6 — AB (ref 3.87–5.11)
RBC: 3.6 — AB (ref 3.87–5.11)

## 2022-04-15 DIAGNOSIS — J449 Chronic obstructive pulmonary disease, unspecified: Secondary | ICD-10-CM

## 2022-04-15 DIAGNOSIS — M48061 Spinal stenosis, lumbar region without neurogenic claudication: Secondary | ICD-10-CM

## 2022-04-15 DIAGNOSIS — S92902B Unspecified fracture of left foot, initial encounter for open fracture: Secondary | ICD-10-CM

## 2022-04-15 DIAGNOSIS — I480 Paroxysmal atrial fibrillation: Secondary | ICD-10-CM

## 2022-04-15 DIAGNOSIS — E1159 Type 2 diabetes mellitus with other circulatory complications: Secondary | ICD-10-CM

## 2022-04-15 DIAGNOSIS — Z8673 Personal history of transient ischemic attack (TIA), and cerebral infarction without residual deficits: Secondary | ICD-10-CM

## 2022-04-15 DIAGNOSIS — F39 Unspecified mood [affective] disorder: Secondary | ICD-10-CM

## 2022-04-21 DIAGNOSIS — R609 Edema, unspecified: Secondary | ICD-10-CM

## 2022-05-07 DIAGNOSIS — J449 Chronic obstructive pulmonary disease, unspecified: Secondary | ICD-10-CM

## 2022-05-07 DIAGNOSIS — M48061 Spinal stenosis, lumbar region without neurogenic claudication: Secondary | ICD-10-CM

## 2022-05-07 DIAGNOSIS — M79671 Pain in right foot: Secondary | ICD-10-CM

## 2022-05-07 DIAGNOSIS — K5190 Ulcerative colitis, unspecified, without complications: Secondary | ICD-10-CM

## 2022-05-07 DIAGNOSIS — E1159 Type 2 diabetes mellitus with other circulatory complications: Secondary | ICD-10-CM

## 2022-05-07 DIAGNOSIS — F39 Unspecified mood [affective] disorder: Secondary | ICD-10-CM

## 2022-05-07 DIAGNOSIS — N3281 Overactive bladder: Secondary | ICD-10-CM

## 2022-05-07 DIAGNOSIS — Z8673 Personal history of transient ischemic attack (TIA), and cerebral infarction without residual deficits: Secondary | ICD-10-CM

## 2022-05-07 DIAGNOSIS — I4891 Unspecified atrial fibrillation: Secondary | ICD-10-CM

## 2022-05-07 DIAGNOSIS — K219 Gastro-esophageal reflux disease without esophagitis: Secondary | ICD-10-CM

## 2022-06-02 DIAGNOSIS — M4802 Spinal stenosis, cervical region: Secondary | ICD-10-CM | POA: Insufficient documentation

## 2022-06-02 DIAGNOSIS — G43109 Migraine with aura, not intractable, without status migrainosus: Secondary | ICD-10-CM | POA: Insufficient documentation

## 2022-06-18 ENCOUNTER — Emergency Department
Admission: EM | Admit: 2022-06-18 | Discharge: 2022-06-18 | Disposition: A | Discharge status: Home / Self Care | Payer: Medicare PPO | Attending: Emergency Medicine | Admitting: Emergency Medicine

## 2022-06-18 ENCOUNTER — Emergency Department: Payer: Medicare PPO

## 2022-06-18 ENCOUNTER — Other Ambulatory Visit: Payer: Self-pay

## 2022-06-18 DIAGNOSIS — S0990XA Unspecified injury of head, initial encounter: Secondary | ICD-10-CM | POA: Diagnosis not present

## 2022-06-18 DIAGNOSIS — M79605 Pain in left leg: Secondary | ICD-10-CM | POA: Insufficient documentation

## 2022-06-18 DIAGNOSIS — Y92410 Unspecified street and highway as the place of occurrence of the external cause: Secondary | ICD-10-CM | POA: Insufficient documentation

## 2022-06-18 DIAGNOSIS — S3992XA Unspecified injury of lower back, initial encounter: Secondary | ICD-10-CM | POA: Diagnosis present

## 2022-06-18 DIAGNOSIS — S32039A Unspecified fracture of third lumbar vertebra, initial encounter for closed fracture: Secondary | ICD-10-CM | POA: Insufficient documentation

## 2022-06-18 DIAGNOSIS — S32030A Wedge compression fracture of third lumbar vertebra, initial encounter for closed fracture: Secondary | ICD-10-CM

## 2022-06-18 DIAGNOSIS — M79604 Pain in right leg: Secondary | ICD-10-CM | POA: Diagnosis not present

## 2022-06-18 DIAGNOSIS — M25511 Pain in right shoulder: Secondary | ICD-10-CM | POA: Diagnosis not present

## 2022-06-18 DIAGNOSIS — M545 Low back pain, unspecified: Secondary | ICD-10-CM | POA: Diagnosis not present

## 2022-06-18 DIAGNOSIS — W19XXXA Unspecified fall, initial encounter: Secondary | ICD-10-CM

## 2022-06-18 MED ORDER — OXYCODONE-ACETAMINOPHEN 5-325 MG PO TABS
1.0000 | ORAL_TABLET | Freq: Once | ORAL | Status: AC
  Administered 2022-06-18: 1 via ORAL
  Filled 2022-06-18: qty 1

## 2022-06-18 MED ORDER — METHOCARBAMOL 500 MG PO TABS
500.0000 mg | ORAL_TABLET | Freq: Once | ORAL | Status: AC
  Administered 2022-06-18: 500 mg via ORAL
  Filled 2022-06-18: qty 1

## 2022-06-18 MED ORDER — METHOCARBAMOL 500 MG PO TABS: 500.0000 mg | ORAL_TABLET | Freq: Four times a day (QID) | ORAL | 0 refills | Status: DC

## 2022-06-18 MED ORDER — OXYCODONE-ACETAMINOPHEN 5-325 MG PO TABS: 1.0000 | ORAL_TABLET | Freq: Four times a day (QID) | ORAL | 0 refills | Status: DC | PRN

## 2022-06-18 NOTE — ED Triage Notes (Signed)
Pt coming [facility] for Fall last night. Pt fell while standing. PT fell onto back. Pt endorsing lower back pain. Pt stating hurts to sit up and cannot ambulate.

## 2022-06-18 NOTE — ED Triage Notes (Signed)
First Nurse Note;  Pt via [facility] from home. Pt had a fall yesterday. Pt c/o neck, back, and leg pain since yesterday, pt states the pain is worse today. Denies LOC. Denies thinners. Pt was able to ambulate yesterday. Pt is A&Ox4 and NAD  122/60 15 60 HR  94% on RA

## 2022-06-18 NOTE — ED Provider Notes (Signed)
[facility] Provider Note  Patient Contact: 3:24 PM (approximate)   History   Fall and Back Pain   HPI  Caitlin Leonard is a 76 y.o. female who presents the emergency department complaining of shoulder, back, bilateral leg pain.  Patient had a mechanical fall last night, falling backwards.  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  Has had increased pain in her lower back, running down both legs.  She recently had a fall back in June in which she broke her left ankle.  Denies pain to this joint, bilateral knees.  Does not believe that she hit her head or lose lost consciousness.  She is complaining of right shoulder pain, lower back pain with radicular symptoms.  No bowel or bladder dysfunction, saddle anesthesia or paresthesias.     Physical Exam   Triage Vital Signs: ED Triage Vitals  Enc Vitals Group     BP 06/18/22 1411 96/84     Pulse Rate 06/18/22 1411 70     Resp 06/18/22 1411 16     Temp 06/18/22 1411 (!) 97.5 F (36.4 C)     Temp Source 06/18/22 1411 Oral     SpO2 06/18/22 1411 92 %     Weight 06/18/22 1413 250 lb (113.4 kg)     Height --      Head Circumference --      Peak Flow --      Pain Score 06/18/22 1413 10     Pain Loc --      Pain Edu? --      Excl. in [place]? --     Most recent vital signs: Vitals:   06/18/22 1411  BP: 96/84  Pulse: 70  Resp: 16  Temp: (!) 97.5 F (36.4 C)  SpO2: 92%     General: Alert and in no acute distress. Head: No acute traumatic finding  Neck: No stridor. No cervical spine tenderness to palpation.  Cardiovascular:  Good peripheral perfusion Respiratory: Normal respiratory effort without tachypnea or retractions. Lungs CTAB. Good air entry to the bases with no decreased or absent breath sounds. Gastrointestinal: Bowel sounds 4 quadrants. Soft and nontender to palpation. No guarding or rigidity. No palpable masses. No distention.  Musculoskeletal: Full range of motion to all  extremities.  Visualization of the shoulder, reveals no obvious deformity.  No open wounds.  Tender diffusely about the joint space without palpable abnormality or deficit.  Visualization of the lumbar spine reveals no visible signs of trauma with abrasions, laceration or ecchymosis.  Diffuse tenderness running down the lumbar spine, bilateral's paraspinal region extending into the SI region.  No point specific tenderness or palpable abnormality.  Patient is still able to move both lower extremities, no shortening or rotation identified.  No significant tenderness over the femurs or hips.  Dorsalis pedis pulses sensation intact and equal bilateral lower extremities. Neurologic:  No gross focal neurologic deficits are appreciated.  Skin:   No rash noted Other:   ED Results / Procedures / Treatments   Labs (all labs ordered are listed, but only abnormal results are displayed) Labs Reviewed - No data to display   EKG     RADIOLOGY  I personally viewed, evaluated, and interpreted these images as part of my medical decision making, as well as reviewing the written report by the radiologist.  ED Provider Interpretation: CT scans of the head, neck were reassuring with regards to no evidence of trauma.  X-ray of the shoulder, bilateral hips is reassuring.  Patient has what appears to be a 20% height loss on the L3 vertebrae on the CT L-spine.  CT Lumbar Spine Wo Contrast  Result Date: 06/18/2022 CLINICAL DATA:  Back trauma, no prior imaging (Age >= 16y).  Fall. EXAM: CT LUMBAR SPINE WITHOUT CONTRAST TECHNIQUE: Multidetector CT imaging of the lumbar spine was performed without intravenous contrast administration. Multiplanar CT image reconstructions were also generated. RADIATION DOSE REDUCTION: This exam was performed according to the departmental dose-optimization program which includes automated exposure control, adjustment of the mA and/or kV according to patient size and/or use of iterative  reconstruction technique. COMPARISON:  Lumbar spine MRI 10/02/2021 FINDINGS: Segmentation: 5 lumbar type vertebrae. Alignment: Mild lumbar levoscoliosis.  No listhesis. Vertebrae: Acute L3 superior endplate compression fracture with 20% vertebral body height loss. No retropulsion or posterior element fracture. No suspicious osseous lesion. Paraspinal and other soft tissues: Abdominal aortic atherosclerosis. Colonic diverticulosis. Disc levels: Similar appearance of lumbar disc and facet degeneration compared to the prior MRI. Mild spinal stenosis at L3-4 and L4-5. Mild neural foraminal stenosis at L4-5 and L5-S1. Prior right laminectomies at L4-5 and L5-S1. IMPRESSION: 1. Acute L3 compression fracture with 20% height loss. 2. Lumbar disc and facet degeneration, similar to the 09/24/2021 MRI. 3. Aortic Atherosclerosis (ICD10-I70.0). Electronically Signed   By: Logan Bores M.D.   On: 06/18/2022 16:33   CT Head Wo Contrast  Result Date: 06/18/2022 CLINICAL DATA:  Polytrauma, blunt.  Fall last night. EXAM: CT HEAD WITHOUT CONTRAST CT CERVICAL SPINE WITHOUT CONTRAST TECHNIQUE: Multidetector CT imaging of the head and cervical spine was performed following the standard protocol without intravenous contrast. Multiplanar CT image reconstructions of the cervical spine were also generated. RADIATION DOSE REDUCTION: This exam was performed according to the departmental dose-optimization program which includes automated exposure control, adjustment of the mA and/or kV according to patient size and/or use of iterative reconstruction technique. COMPARISON:  Head MRI 12/31/2015. Maxillofacial CT 10/27/2017. Cervical spine CT 07/02/2011. FINDINGS: CT HEAD FINDINGS Brain: There is no evidence of an acute infarct, intracranial hemorrhage, mass, midline shift, or extra-axial fluid collection. A small chronic infarct in the right basal ganglia is unchanged from the prior MRI. A moderately large chronic right PCA infarct is new.  There is associated mild ex vacuo dilatation of the occipital horn of the right lateral ventricle. The ventricles are otherwise normal in size. Vascular: Calcified atherosclerosis at the skull base. No hyperdense vessel. Skull: No fracture or suspicious osseous lesion. Sinuses/Orbits: Visualized paranasal sinuses and mastoid air cells are clear. Bilateral cataract extraction. Other: None. CT CERVICAL SPINE FINDINGS Alignment: Straightening of the normal cervical lordosis with minimal anterolisthesis of C2 on C3, C3 on C4, C4 on C5, and C5 on C6, likely degenerative. Skull base and vertebrae: No acute fracture or suspicious osseous lesion. Moderate median C1-2 arthropathy. Soft tissues and spinal canal: No prevertebral fluid or swelling. No visible canal hematoma. Disc levels: Mild cervical spondylosis. Diffuse facet arthrosis, moderate to severe in the mid and upper cervical spine. Asymmetrically severe neural foraminal stenosis on the right at C3-4 and C5-6 due to uncovertebral and facet spurring. Upper chest: Negative. Other: Mild calcific atherosclerosis at the carotid bifurcations. IMPRESSION: 1. No evidence of acute intracranial abnormality. 2. 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 3. No acute cervical spine fracture. Electronically Signed   By: Logan Bores M.D.   On: 06/18/2022 16:26   CT Cervical Spine Wo Contrast  Result Date:  06/18/2022 CLINICAL DATA:  Polytrauma, blunt.  Fall last night. EXAM: CT HEAD WITHOUT CONTRAST CT CERVICAL SPINE WITHOUT CONTRAST TECHNIQUE: Multidetector CT imaging of the head and cervical spine was performed following the standard protocol without intravenous contrast. Multiplanar CT image reconstructions of the cervical spine were also generated. RADIATION DOSE REDUCTION: This exam was performed according to the departmental dose-optimization program which includes automated exposure control, adjustment of the mA and/or kV according to patient size and/or use of  iterative reconstruction technique. COMPARISON:  Head MRI 12/31/2015. Maxillofacial CT 10/27/2017. Cervical spine CT 07/02/2011. FINDINGS: CT HEAD FINDINGS Brain: There is no evidence of an acute infarct, intracranial hemorrhage, mass, midline shift, or extra-axial fluid collection. A small chronic infarct in the right basal ganglia is unchanged from the prior MRI. A moderately large chronic right PCA infarct is new. There is associated mild ex vacuo dilatation of the occipital horn of the right lateral ventricle. The ventricles are otherwise normal in size. Vascular: Calcified atherosclerosis at the skull base. No hyperdense vessel. Skull: No fracture or suspicious osseous lesion. Sinuses/Orbits: Visualized paranasal sinuses and mastoid air cells are clear. Bilateral cataract extraction. Other: None. CT CERVICAL SPINE FINDINGS Alignment: Straightening of the normal cervical lordosis with minimal anterolisthesis of C2 on C3, C3 on C4, C4 on C5, and C5 on C6, likely degenerative. Skull base and vertebrae: No acute fracture or suspicious osseous lesion. Moderate median C1-2 arthropathy. Soft tissues and spinal canal: No prevertebral fluid or swelling. No visible canal hematoma. Disc levels: Mild cervical spondylosis. Diffuse facet arthrosis, moderate to severe in the mid and upper cervical spine. Asymmetrically severe neural foraminal stenosis on the right at C3-4 and C5-6 due to uncovertebral and facet spurring. Upper chest: Negative. Other: Mild calcific atherosclerosis at the carotid bifurcations. IMPRESSION: 1. No evidence of acute intracranial abnormality. 2. 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 3. No acute cervical spine fracture. Electronically Signed   By: Logan Bores M.D.   On: 06/18/2022 16:26   DG Hip Unilat W or Wo Pelvis 2-3 Views Right  Result Date: 06/18/2022 CLINICAL DATA:  Fall with hip pain EXAM: DG HIP (WITH OR WITHOUT PELVIS) 2-3V RIGHT COMPARISON:  03/26/2022 FINDINGS: SI  joints are non widened. Pubic symphysis and rami appear intact. No definitive fracture or malalignment. IMPRESSION: No definite acute osseous abnormality Electronically Signed   By: Donavan Foil M.D.   On: 06/18/2022 16:13   DG Shoulder Right  Result Date: 06/18/2022 CLINICAL DATA:  Status post fall.  Right shoulder pain. EXAM: RIGHT SHOULDER - 2+ VIEW COMPARISON:  None Available. FINDINGS: There is no evidence of fracture or dislocation. Moderate degenerative changes noted at the acromioclavicular joint. Soft tissues are unremarkable. IMPRESSION: 1. No acute findings. 2. AC joint osteoarthritis. Electronically Signed   By: Kerby Moors M.D.   On: 06/18/2022 16:11   DG Hip Unilat W or Wo Pelvis 2-3 Views Left  Result Date: 06/18/2022 CLINICAL DATA:  Fall with hip pain EXAM: DG HIP (WITH OR WITHOUT PELVIS) 2-3V LEFT COMPARISON:  03/26/2022 FINDINGS: SI joints are non widened. Pubic symphysis and rami appear intact. No definitive fracture or malalignment. IMPRESSION: No definite acute osseous abnormality Electronically Signed   By: Donavan Foil M.D.   On: 06/18/2022 16:11    PROCEDURES:  Critical Care performed: No  Procedures   MEDICATIONS ORDERED IN ED: Medications  oxyCODONE-acetaminophen (PERCOCET/ROXICET) 5-325 MG per tablet 1 tablet (1 tablet Oral Given 06/18/22 1534)  methocarbamol (ROBAXIN) tablet 500 mg (500 mg  Oral Given 06/18/22 1800)     IMPRESSION / MDM / ASSESSMENT AND PLAN / ED COURSE  I reviewed the triage vital signs and the nursing notes.                              Differential diagnosis includes, but is not limited to, intracranial hemorrhage, skull fracture, shoulder fracture, shoulder dislocation, shoulder contusion, lumbar contusion, lumbar strain, compression fracture, hip fracture, hip dislocation  Patient's presentation is most consistent with acute presentation with potential threat to life or bodily function.   Patient's diagnosis is consistent with fall,  L3 compression fracture.  Patient presented to the emergency department after slipping and falling yesterday.  Patient did land on her back and side.  Complaining of low back pain, bilateral hip pain, shoulder pain.  Does not believe that she hit her head or lost consciousness.  Given the mechanism of injury, patient will have imaging performed of the head, neck, shoulder, lumbar spine and bilateral hips.  Imaging is reassuring with the exception of the lumbar spine which reveals a new compression fracture of approximately 20% height loss in the L3 vertebrae.  Patient has no concerning neuro deficits with no bowel or bladder incontinence, saddle anesthesia or paresthesias.  Patient was given options for admission for pain control versus going home with pain medication and brace.  Patient prefers to go home and she has a health network in place due to a previous ankle fracture.  Patient was discussed with on-call neurosurgeon, Dr. Izora Ribas who advises that an LSO brace, pain medication and outpatient follow-up would be acceptable for this patient..  I will prescribe Percocet for the patient for pain.  I will also prescribe muscle relaxer for the patient.  Patient is to follow-up with neurosurgery as discussed.  Patient is given ED precautions to return to the ED for any worsening or new symptoms.        FINAL CLINICAL IMPRESSION(S) / ED DIAGNOSES   Final diagnoses:  Fall, initial encounter  Closed compression fracture of L3 lumbar vertebra, initial encounter ([place])     Rx / DC Orders   ED Discharge Orders          Ordered    oxyCODONE-acetaminophen (PERCOCET/ROXICET) 5-325 MG tablet  Every 6 hours PRN        06/18/22 2046    methocarbamol (ROBAXIN) 500 MG tablet  4 times daily        06/18/22 2046             Note:  This document was prepared using Dragon voice recognition software and may include unintentional dictation errors.   Brynda Peon 06/18/22 2046     Harvest Dark, MD 06/18/22 2328

## 2022-06-18 NOTE — ED Triage Notes (Signed)
Pt took ibuprofen and oxy x2 with no relief.

## 2022-06-18 NOTE — ED Notes (Signed)
Purewick placed on patient. Husband at bedside.

## 2022-06-18 NOTE — ED Notes (Signed)
PT given call bell

## 2022-06-18 NOTE — ED Notes (Signed)
PT given phone to call husband

## 2022-06-23 ENCOUNTER — Telehealth: Payer: Self-pay | Admitting: Student

## 2022-06-23 ENCOUNTER — Ambulatory Visit: Payer: Self-pay | Admitting: Urology

## 2022-06-23 DIAGNOSIS — S32030D Wedge compression fracture of third lumbar vertebra, subsequent encounter for fracture with routine healing: Secondary | ICD-10-CM

## 2022-06-23 DIAGNOSIS — F411 Generalized anxiety disorder: Secondary | ICD-10-CM

## 2022-06-23 MED ORDER — ALPRAZOLAM 0.5 MG PO TABS: 0.5000 mg | ORAL_TABLET | Freq: Every day | ORAL | 1 refills | Status: DC

## 2022-06-23 MED ORDER — OXYCODONE-ACETAMINOPHEN 5-325 MG PO TABS: 1.0000 | ORAL_TABLET | ORAL | 0 refills | Status: DC | PRN

## 2022-06-23 NOTE — Telephone Encounter (Signed)
Patient is a new admission to healthcare center. Rx for medications to SNF pharmacy for percocet q4 hours in the setting of an acute fracture and xanax nightly for anxiety.

## 2022-06-25 ENCOUNTER — Encounter: Payer: Self-pay | Admitting: Student

## 2022-06-25 ENCOUNTER — Non-Acute Institutional Stay (SKILLED_NURSING_FACILITY): Payer: Medicare PPO | Admitting: Student

## 2022-06-25 DIAGNOSIS — R2689 Other abnormalities of gait and mobility: Secondary | ICD-10-CM

## 2022-06-25 DIAGNOSIS — M1990 Unspecified osteoarthritis, unspecified site: Secondary | ICD-10-CM

## 2022-06-25 DIAGNOSIS — I10 Essential (primary) hypertension: Secondary | ICD-10-CM

## 2022-06-25 DIAGNOSIS — K52831 Collagenous colitis: Secondary | ICD-10-CM

## 2022-06-25 DIAGNOSIS — R296 Repeated falls: Secondary | ICD-10-CM | POA: Diagnosis not present

## 2022-06-25 DIAGNOSIS — F331 Major depressive disorder, recurrent, moderate: Secondary | ICD-10-CM | POA: Diagnosis not present

## 2022-06-25 DIAGNOSIS — F324 Major depressive disorder, single episode, in partial remission: Secondary | ICD-10-CM

## 2022-06-25 DIAGNOSIS — F411 Generalized anxiety disorder: Secondary | ICD-10-CM | POA: Diagnosis not present

## 2022-06-25 DIAGNOSIS — S92252D Displaced fracture of navicular [scaphoid] of left foot, subsequent encounter for fracture with routine healing: Secondary | ICD-10-CM

## 2022-06-25 DIAGNOSIS — M8580 Other specified disorders of bone density and structure, unspecified site: Secondary | ICD-10-CM

## 2022-06-25 DIAGNOSIS — K559 Vascular disorder of intestine, unspecified: Secondary | ICD-10-CM

## 2022-06-25 DIAGNOSIS — S32030D Wedge compression fracture of third lumbar vertebra, subsequent encounter for fracture with routine healing: Secondary | ICD-10-CM | POA: Diagnosis not present

## 2022-06-25 DIAGNOSIS — I480 Paroxysmal atrial fibrillation: Secondary | ICD-10-CM

## 2022-06-25 DIAGNOSIS — I639 Cerebral infarction, unspecified: Secondary | ICD-10-CM

## 2022-06-25 DIAGNOSIS — K52839 Microscopic colitis, unspecified: Secondary | ICD-10-CM

## 2022-06-25 DIAGNOSIS — J410 Simple chronic bronchitis: Secondary | ICD-10-CM

## 2022-06-25 DIAGNOSIS — I6381 Other cerebral infarction due to occlusion or stenosis of small artery: Secondary | ICD-10-CM

## 2022-06-25 MED ORDER — OXYCODONE-ACETAMINOPHEN 5-325 MG PO TABS: 1.0000 | ORAL_TABLET | Freq: Three times a day (TID) | ORAL | 0 refills | Status: DC | PRN

## 2022-06-25 MED ORDER — SERTRALINE HCL 100 MG PO TABS: 100.0000 mg | ORAL_TABLET | Freq: Every day | ORAL | Status: AC

## 2022-06-25 NOTE — Progress Notes (Signed)
Provider:   Location:  [facility] Room Number: NO/109 Place of Service:  SNF (31)  PCP: Ria Clock, MD Patient Care Team: Ria Clock, MD as PCP - General (Internal Medicine) Minna Merritts, MD as Consulting Physician (Cardiology)  Extended Emergency Contact Information Primary Emergency Contact: Jaclyn Prime, [place] [address] Johnnette Litter of [place] Phone: [phone] Mobile Phone: [phone] Relation: Spouse  Code Status: DNR ( verbal) Goals of Care: Advanced Directive information    06/25/2022    9:19 AM  Advanced Directives  Does Patient Have a Medical Advance Directive? Yes  Type of Paramedic of [place];Living will  Does patient want to make changes to medical advance directive? No - Patient declined  Copy of [place] in Chart? No - copy requested      Chief Complaint  Patient presents with   New Admit To SNF    Patient is new admit to SNF    HPI: Patient is a 76 y.o. female seen today for admission to [facility] [place]   She fell in her dressing room flat on her back   She had an L3 compression fracture. Brother has a bad back and her mother has scoliosis. She is prone to falling. She fell and crushed her navicular bone and was here 6 weeks at that time. She had some nausea dn diarrhea yesterday. She feels that she needs th epain medicine about every 8 hours. She has not had more diarrhea today. She has a follow up appointment with neurosurgery. She sometimes looses her balance when she pulls her shirt up, etc. Sometimes she gets frustrated. She has bad eyesight due a stroke and they have neuroophthalmologist at [facility].   She walks with a walker at home. She has had numerous falls this year. She has had maybe 4 this year. Her husband does most of the ADLs. They cook together some, but her endurance is poor. She has therapy periodically 3d a week when she has the stamina. She  does not drive. Before this injury, she was showering by herself but her husband was nearby. Went to the bathroom independently. She manags her own medications.   She has been decreased on her pain medications before.  Her husband Alvester Chou is here for some history   Past Medical History:  Diagnosis Date   Anemia    Anxiety    Arthritis    osteoarthritis   Asthma    due to seasonal alllergies   Atrial fibrillation ([place])    Carpal tunnel syndrome    Cataract    Cervicalgia    Collagenous colitis 2010   COPD (chronic obstructive pulmonary disease) (HCC)    Depression    situational   Diabetes mellitus without complication (HCC)    Dysrhythmia    GERD (gastroesophageal reflux disease)    GI bleed    Headache(784.0)    migraines; 2 x month   Heart murmur    MVP ( symptomatic)   History of IBS    Hyperlipemia    Hypertension    on medication x 10 years   Migraine    MVP (mitral valve prolapse)    Shortness of breath    exertional   Sinoatrial node dysfunction ([place])    Sleep apnea 2007   does not use CPAP since 40 # wt loss   Stroke [facility])    Syncopal episodes    Past Surgical History:  Procedure Laterality Date   ABDOMINAL HYSTERECTOMY  1979   BACK SURGERY     BREAST CYST ASPIRATION Right 25 plus yrs ago   CARDIAC CATHETERIZATION  2012   [place]  2011   COLONOSCOPY WITH PROPOFOL N/A 03/25/2019   Procedure: COLONOSCOPY WITH PROPOFOL;  Surgeon: Lollie Sails, MD;  Location: [facility] ENDOSCOPY;  Service: Endoscopy;  Laterality: N/A;   COLONOSCOPY WITH PROPOFOL N/A 11/29/2020   Procedure: COLONOSCOPY WITH PROPOFOL;  Surgeon: Lesly Rubenstein, MD;  Location: [facility] ENDOSCOPY;  Service: Endoscopy;  Laterality: N/A;   ESOPHAGOGASTRODUODENOSCOPY (EGD) WITH PROPOFOL N/A 03/25/2019   Procedure: ESOPHAGOGASTRODUODENOSCOPY (EGD) WITH PROPOFOL;  Surgeon: Lollie Sails, MD;  Location: [facility] ENDOSCOPY;  Service: Endoscopy;  Laterality: N/A;    ESOPHAGOGASTRODUODENOSCOPY (EGD) WITH PROPOFOL N/A 11/29/2020   Procedure: ESOPHAGOGASTRODUODENOSCOPY (EGD) WITH PROPOFOL;  Surgeon: Lesly Rubenstein, MD;  Location: [facility] ENDOSCOPY;  Service: Endoscopy;  Laterality: N/A;   FLEXIBLE BRONCHOSCOPY N/A 10/10/2016   Procedure: FLEXIBLE BRONCHOSCOPY;  Surgeon: [name], MD;  Location: [facility] ORS;  Service: Pulmonary;  Laterality: N/A;   FLEXIBLE BRONCHOSCOPY N/A 12/11/2017   Procedure: FLEXIBLE BRONCHOSCOPY;  Surgeon: [name], MD;  Location: [facility] ORS;  Service: Pulmonary;  Laterality: N/A;   JOINT REPLACEMENT     KNEE ARTHROSCOPY  1985, 1990, 2012 x 2   bilateral   LUMBAR LAMINECTOMY  [id number]   NASAL SINUS SURGERY  1994   TONSILLECTOMY     TOTAL KNEE ARTHROPLASTY  12/22/2011   Procedure: TOTAL KNEE ARTHROPLASTY;  Surgeon: Rudean Haskell, MD;  Location: [place];  Service: Orthopedics;  Laterality: Left;    reports that she has never smoked. She has never used smokeless tobacco. She reports current alcohol use of about 1.0 standard drink of alcohol per week. She reports that she does not use drugs. Social History   Socioeconomic History   Marital status: Married    Spouse name: Not on file   Number of children: 2   Years of education: Not on file   Highest education level: Not on file  Occupational History   Occupation: retired Pharmacist, hospital  Tobacco Use   Smoking status: Never   Smokeless tobacco: Never  Vaping Use   Vaping Use: Never used  Substance and Sexual Activity   Alcohol use: Yes    Alcohol/week: 1.0 standard drink of alcohol    Types: 1 Glasses of wine per week    Comment: occasional wine - less than once a month   Drug use: Never   Sexual activity: Not Currently  Other Topics Concern   Not on file  Social History Narrative   Lives with husband in [place]. She is retired Education officer, museum. Attends church regular and has strong emotional, and spiritual support form family, and friends. Likes to read, crossword puzzles  and games.   Social Determinants of Health   Financial Resource Strain: Not on file  Food Insecurity: Not on file  Transportation Needs: Not on file  Physical Activity: Not on file  Stress: Not on file  Social Connections: Not on file  Intimate Partner Violence: Not on file    Functional Status Survey:    Family History  Problem Relation Age of Onset   Hypertension Mother    Hyperlipidemia Mother    Diabetes Mother    Hypertension Father    Hyperlipidemia Father    Arrhythmia Father        A-Fib   Diabetes Paternal Uncle    Diabetes  Paternal Grandfather    Breast cancer Maternal Aunt        great in late 70's   Anesthesia problems Neg Hx    Colon cancer Neg Hx    Stomach cancer Neg Hx    Rectal cancer Neg Hx    Liver cancer Neg Hx    Esophageal cancer Neg Hx     Health Maintenance  Topic Date Due   OPHTHALMOLOGY EXAM  12/04/2017   FOOT EXAM  12/09/2017   HEMOGLOBIN A1C  11/03/2019   COVID-19 Vaccine (6 - Moderna risk series) 08/17/2021   INFLUENZA VACCINE  05/06/2022   Diabetic kidney evaluation - Urine ACR  07/12/2022   Diabetic kidney evaluation - GFR measurement  03/27/2023   MAMMOGRAM  02/05/2024   TETANUS/TDAP  06/15/2024   COLONOSCOPY (Pts 45-52yr Insurance coverage will need to be confirmed)  11/29/2030   Pneumonia Vaccine 76 Years old  Completed   DEXA SCAN  Completed   Hepatitis C Screening  Completed   Zoster Vaccines- Shingrix  Completed   HPV VACCINES  Aged Out    Allergies  Allergen Reactions   Contrast Media [Iodinated Contrast Media] Anaphylaxis   Gadolinium Derivatives Anaphylaxis   Gadoversetamide Anaphylaxis   Shellfish Allergy Nausea And Vomiting and Other (See Comments)    Can eat shrimp but not oysters Dizziness,severe nausea and vomiting ( can eat shrimp CAN NOT EAT ORYSTERS) Other reaction(s): Other (See Comments) Dizziness,severe nausea and vomiting ( can eat shrimp CAN NOT EAT ORYSTERS)  Can eat shrimp but not  oysters Dizziness,severe nausea and vomiting ( can eat shrimp CAN NOT EAT ORYSTERS)  Can eat shrimp but not oysters    Sulfa Antibiotics Hives and Other (See Comments)    GI upset GI upset GI upset  GI upset GI upset GI upset Other reaction(s): Other (See Comments) GI upset GI upset  GI upset GI upset    Hydroxychloroquine Sulfate Hives and Other (See Comments)    Severe hives, all over like chicken pox.   Banana Nausea And Vomiting and Other (See Comments)    Raw bananas   Limonene Other (See Comments)    GI upset GI upset    Morphine And Related    Nabumetone Other (See Comments)    Hypertension   Otezla [Apremilast]    Potassium-Containing Compounds Other (See Comments)    GI upset   Prednisone Nausea And Vomiting   Sulfasalazine Hives and Other (See Comments)    GI upset   Sulfonamide Derivatives Hives   Metronidazole Rash   Sulfamethoxazole Rash    Outpatient Encounter Medications as of 06/25/2022  Medication Sig   ALPRAZolam (XANAX) 0.5 MG tablet Take 1 tablet (0.5 mg total) by mouth at bedtime.   aspirin 81 MG chewable tablet Chew 81 mg by mouth daily.   budesonide (ENTOCORT EC) 3 MG 24 hr capsule Take 9 mg by mouth daily.   busPIRone (BUSPAR) 10 MG tablet Take 1 tablet (10 mg total) by mouth 2 (two) times daily.   carboxymethylcellulose (REFRESH PLUS) 0.5 % SOLN Place 1 drop into both eyes 2 (two) times daily.   cetirizine (ZYRTEC) 10 MG tablet TAKE 1 TABLET(10 MG) BY MOUTH AT BEDTIME   EPIPEN 2-PAK 0.3 MG/0.3ML SOAJ injection Inject 0.3 mg into the muscle once.    NURTEC 75 MG TBDP Take 75 mg by mouth daily as needed for migraine.   polyethylene glycol (MIRALAX / GLYCOLAX) 17 g packet Take 17 g by mouth 2 (two) times daily.  rosuvastatin (CRESTOR) 40 MG tablet Take 40 mg by mouth daily.   [DISCONTINUED] acetaminophen (TYLENOL) 325 MG tablet Take 2 tablets (650 mg total) by mouth every 6 (six) hours as needed for mild pain (or Fever >/= 101).    [DISCONTINUED] lidocaine (LIDODERM) 5 % Place 1 patch onto the skin daily. Remove & Discard patch within 12 hours or as directed by MD   [DISCONTINUED] memantine (NAMENDA) 10 MG tablet Take 10 mg by mouth 2 (two) times daily.   [DISCONTINUED] methocarbamol (ROBAXIN) 500 MG tablet Take 1 tablet (500 mg total) by mouth 4 (four) times daily.   [DISCONTINUED] mirtazapine (REMERON) 30 MG tablet Take 30 mg by mouth at bedtime.   [DISCONTINUED] omeprazole (PRILOSEC) 40 MG capsule TAKE 1 CAPSULE BY MOUTH DAILY AT DINNER TIME   [DISCONTINUED] oxyCODONE-acetaminophen (PERCOCET/ROXICET) 5-325 MG tablet Take 1 tablet by mouth every 4 (four) hours as needed for up to 5 days for severe pain.   [DISCONTINUED] rosuvastatin (CRESTOR) 40 MG tablet Take 1 tablet (40 mg total) by mouth daily.   [DISCONTINUED] senna-docusate (SENOKOT-S) 8.6-50 MG tablet Take 1 tablet by mouth at bedtime.   [DISCONTINUED] sertraline (ZOLOFT) 100 MG tablet Take 0.5 tablets (50 mg total) by mouth at bedtime.   oxyCODONE-acetaminophen (PERCOCET/ROXICET) 5-325 MG tablet Take 1 tablet by mouth every 8 (eight) hours as needed for severe pain.   sertraline (ZOLOFT) 100 MG tablet Take 1 tablet (100 mg total) by mouth at bedtime.   No facility-administered encounter medications on file as of 06/25/2022.    Review of Systems  Constitutional:  Positive for activity change and appetite change. Negative for chills and fever.  Respiratory:  Negative for chest tightness and shortness of breath.   Cardiovascular:  Negative for chest pain.  Gastrointestinal:  Positive for nausea. Negative for abdominal distention and abdominal pain.  Genitourinary:  Negative for difficulty urinating.  Neurological:  Negative for dizziness.  Psychiatric/Behavioral:  Negative for agitation.     Vitals:   06/25/22 1025  BP: 120/73  Pulse: (!) 110  Resp: 20  Temp: (!) 97 F (36.1 C)  SpO2: 92%  Weight: 264 lb (119.7 kg)  Height: _0  (1.753 m)   Body mass  index is 38.99 kg/m. Physical Exam Constitutional:      Appearance: She is obese.  Cardiovascular:     Rate and Rhythm: Normal rate.     Pulses: Normal pulses.     Heart sounds: Normal heart sounds.  Pulmonary:     Effort: Pulmonary effort is normal.     Breath sounds: Normal breath sounds.  Abdominal:     General: Abdomen is flat. Bowel sounds are normal.     Palpations: Abdomen is soft.  Musculoskeletal:     Comments: 4/5 strength bilateral upper and lower extremities  Skin:    General: Skin is warm and dry.  Neurological:     Mental Status: She is alert and oriented to person, place, and time.  Psychiatric:        Mood and Affect: Mood normal.     Labs reviewed: Basic Metabolic Panel: Recent Labs    08/07/21 0829 11/18/21 1232 11/19/21 0553 03/26/22 1508  NA 137 137 140  --   K 3.9 3.7 3.1*  --   CL 101 102 107  --   CO2 _1 --   GLUCOSE 159* 189* 134*  --   BUN _2 --   CREATININE 0.99 0.81 0.47 0.63  CALCIUM  9.3 10.0 9.3  --   MG  --  2.0  --   --    Liver Function Tests: Recent Labs    11/18/21 1232  AST 23  ALT 12  ALKPHOS 67  BILITOT 1.1  PROT 7.1  ALBUMIN 3.9   No results for input(s): "LIPASE", "AMYLASE" in the last 8760 hours. No results for input(s): "AMMONIA" in the last 8760 hours. CBC: Recent Labs    11/19/21 0553 03/26/22 1508 03/27/22 0939  WBC 4.8 5.9 7.0  HGB 12.8 13.1 12.4  HCT 39.9 41.4 38.7  MCV 102.0* 100.2* 99.2  PLT 130* 78* 150   Cardiac Enzymes: No results for input(s): "CKTOTAL", "CKMB", "CKMBINDEX", "TROPONINI" in the last 8760 hours. BNP: Invalid input(s): "POCBNP" Lab Results  Component Value Date   HGBA1C 6.3 (H) 05/03/2019   Lab Results  Component Value Date   TSH 6.548 (H) 11/18/2021   No results found for: "VITAMINB12" No results found for: "FOLATE" No results found for: "IRON", "TIBC", "FERRITIN"  Imaging and Procedures obtained prior to SNF admission: CT Lumbar Spine Wo  Contrast  Result Date: 06/18/2022 CLINICAL DATA:  Back trauma, no prior imaging (Age >= 16y).  Fall. EXAM: CT LUMBAR SPINE WITHOUT CONTRAST TECHNIQUE: Multidetector CT imaging of the lumbar spine was performed without intravenous contrast administration. Multiplanar CT image reconstructions were also generated. RADIATION DOSE REDUCTION: This exam was performed according to the departmental dose-optimization program which includes automated exposure control, adjustment of the mA and/or kV according to patient size and/or use of iterative reconstruction technique. COMPARISON:  Lumbar spine MRI 10/02/2021 FINDINGS: Segmentation: 5 lumbar type vertebrae. Alignment: Mild lumbar levoscoliosis.  No listhesis. Vertebrae: Acute L3 superior endplate compression fracture with 20% vertebral body height loss. No retropulsion or posterior element fracture. No suspicious osseous lesion. Paraspinal and other soft tissues: Abdominal aortic atherosclerosis. Colonic diverticulosis. Disc levels: Similar appearance of lumbar disc and facet degeneration compared to the prior MRI. Mild spinal stenosis at L3-4 and L4-5. Mild neural foraminal stenosis at L4-5 and L5-S1. Prior right laminectomies at L4-5 and L5-S1. IMPRESSION: 1. Acute L3 compression fracture with 20% height loss. 2. Lumbar disc and facet degeneration, similar to the 09/24/2021 MRI. 3. Aortic Atherosclerosis (ICD10-I70.0). Electronically Signed   By: Logan Bores M.D.   On: 06/18/2022 16:33   CT Head Wo Contrast  Result Date: 06/18/2022 CLINICAL DATA:  Polytrauma, blunt.  Fall last night. EXAM: CT HEAD WITHOUT CONTRAST CT CERVICAL SPINE WITHOUT CONTRAST TECHNIQUE: Multidetector CT imaging of the head and cervical spine was performed following the standard protocol without intravenous contrast. Multiplanar CT image reconstructions of the cervical spine were also generated. RADIATION DOSE REDUCTION: This exam was performed according to the departmental dose-optimization  program which includes automated exposure control, adjustment of the mA and/or kV according to patient size and/or use of iterative reconstruction technique. COMPARISON:  Head MRI 12/31/2015. Maxillofacial CT 10/27/2017. Cervical spine CT 07/02/2011. FINDINGS: CT HEAD FINDINGS Brain: There is no evidence of an acute infarct, intracranial hemorrhage, mass, midline shift, or extra-axial fluid collection. A small chronic infarct in the right basal ganglia is unchanged from the prior MRI. A moderately large chronic right PCA infarct is new. There is associated mild ex vacuo dilatation of the occipital horn of the right lateral ventricle. The ventricles are otherwise normal in size. Vascular: Calcified atherosclerosis at the skull base. No hyperdense vessel. Skull: No fracture or suspicious osseous lesion. Sinuses/Orbits: Visualized paranasal sinuses and mastoid air cells are clear. Bilateral cataract extraction.  Other: None. CT CERVICAL SPINE FINDINGS Alignment: Straightening of the normal cervical lordosis with minimal anterolisthesis of C2 on C3, C3 on C4, C4 on C5, and C5 on C6, likely degenerative. Skull base and vertebrae: No acute fracture or suspicious osseous lesion. Moderate median C1-2 arthropathy. Soft tissues and spinal canal: No prevertebral fluid or swelling. No visible canal hematoma. Disc levels: Mild cervical spondylosis. Diffuse facet arthrosis, moderate to severe in the mid and upper cervical spine. Asymmetrically severe neural foraminal stenosis on the right at C3-4 and C5-6 due to uncovertebral and facet spurring. Upper chest: Negative. Other: Mild calcific atherosclerosis at the carotid bifurcations. IMPRESSION: 1. No evidence of acute intracranial abnormality. 2. 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 3. No acute cervical spine fracture. Electronically Signed   By: Logan Bores M.D.   On: 06/18/2022 16:26   CT Cervical Spine Wo Contrast  Result Date: 06/18/2022 CLINICAL DATA:   Polytrauma, blunt.  Fall last night. EXAM: CT HEAD WITHOUT CONTRAST CT CERVICAL SPINE WITHOUT CONTRAST TECHNIQUE: Multidetector CT imaging of the head and cervical spine was performed following the standard protocol without intravenous contrast. Multiplanar CT image reconstructions of the cervical spine were also generated. RADIATION DOSE REDUCTION: This exam was performed according to the departmental dose-optimization program which includes automated exposure control, adjustment of the mA and/or kV according to patient size and/or use of iterative reconstruction technique. COMPARISON:  Head MRI 12/31/2015. Maxillofacial CT 10/27/2017. Cervical spine CT 07/02/2011. FINDINGS: CT HEAD FINDINGS Brain: There is no evidence of an acute infarct, intracranial hemorrhage, mass, midline shift, or extra-axial fluid collection. A small chronic infarct in the right basal ganglia is unchanged from the prior MRI. A moderately large chronic right PCA infarct is new. There is associated mild ex vacuo dilatation of the occipital horn of the right lateral ventricle. The ventricles are otherwise normal in size. Vascular: Calcified atherosclerosis at the skull base. No hyperdense vessel. Skull: No fracture or suspicious osseous lesion. Sinuses/Orbits: Visualized paranasal sinuses and mastoid air cells are clear. Bilateral cataract extraction. Other: None. CT CERVICAL SPINE FINDINGS Alignment: Straightening of the normal cervical lordosis with minimal anterolisthesis of C2 on C3, C3 on C4, C4 on C5, and C5 on C6, likely degenerative. Skull base and vertebrae: No acute fracture or suspicious osseous lesion. Moderate median C1-2 arthropathy. Soft tissues and spinal canal: No prevertebral fluid or swelling. No visible canal hematoma. Disc levels: Mild cervical spondylosis. Diffuse facet arthrosis, moderate to severe in the mid and upper cervical spine. Asymmetrically severe neural foraminal stenosis on the right at C3-4 and C5-6 due to  uncovertebral and facet spurring. Upper chest: Negative. Other: Mild calcific atherosclerosis at the carotid bifurcations. IMPRESSION: 1. No evidence of acute intracranial abnormality. 2. 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 3. No acute cervical spine fracture. Electronically Signed   By: Logan Bores M.D.   On: 06/18/2022 16:26   DG Hip Unilat W or Wo Pelvis 2-3 Views Right  Result Date: 06/18/2022 CLINICAL DATA:  Fall with hip pain EXAM: DG HIP (WITH OR WITHOUT PELVIS) 2-3V RIGHT COMPARISON:  03/26/2022 FINDINGS: SI joints are non widened. Pubic symphysis and rami appear intact. No definitive fracture or malalignment. IMPRESSION: No definite acute osseous abnormality Electronically Signed   By: Donavan Foil M.D.   On: 06/18/2022 16:13   DG Shoulder Right  Result Date: 06/18/2022 CLINICAL DATA:  Status post fall.  Right shoulder pain. EXAM: RIGHT SHOULDER - 2+ VIEW COMPARISON:  None Available. FINDINGS: There  is no evidence of fracture or dislocation. Moderate degenerative changes noted at the acromioclavicular joint. Soft tissues are unremarkable. IMPRESSION: 1. No acute findings. 2. AC joint osteoarthritis. Electronically Signed   By: Kerby Moors M.D.   On: 06/18/2022 16:11   DG Hip Unilat W or Wo Pelvis 2-3 Views Left  Result Date: 06/18/2022 CLINICAL DATA:  Fall with hip pain EXAM: DG HIP (WITH OR WITHOUT PELVIS) 2-3V LEFT COMPARISON:  03/26/2022 FINDINGS: SI joints are non widened. Pubic symphysis and rami appear intact. No definitive fracture or malalignment. IMPRESSION: No definite acute osseous abnormality Electronically Signed   By: Donavan Foil M.D.   On: 06/18/2022 16:11    Assessment/Plan 1. Major depressive disorder, recurrent episode, moderate ([place]) Patient with history of depression she states her symptoms are well controlled on current medication regimen.  We will continue sertraline 100 mg daily.  2. Generalized anxiety disorder Patient with history of  anxiety  she states her symptoms are well controlled on current medication regimen.  We will continue sertraline 100 mg daily.  Discussed concern for Xanax nightly and increased risk of falls as well as confusion.  After discussion with husband and wife they will defer dose reduction at this time.  We will continue Xanax 1.5 mg nightly.  3. Compression fracture of L3 vertebra with routine healing, subsequent encounter Patient seen in emergency department for L3 compression fracture and had difficulty with pain control upon return home.  She states taking Percocet 5-325 mg every 8 hours for pain has been sufficient control for her we will reduce frequency of pain control to every 8 hours.  Patient previously was on long-term use of Percocet and states she was discontinued without issue.  We will plan on this being a short course during her therapy.  We will continue physical therapy and monitor for progress.  4. Recurrent falls Patient previously on numerous medications that could be contributing to falls.  Multiple notable history for abnormal gait.  She uses supportive devices for ambulation.  This is her fourth fall in the last calendar year.  Given numerous injuries concern for continuation of medications that are contributing to fall including but not limited to anxiety medication depression medication and pain meds.  5. Osteopenia determined by x-ray Patient has a history of osteopenia now with a pathologic fracture in the setting of a ground-level fall.  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  6. Ischemic stroke [facility]) Patient with history of ischemic stroke with minor deficits.  We will continue daily aspirin 81 mg daily and rosuvastatin 40 mg daily.  7. Simple chronic bronchitis ([place]) Patient without symptoms at this time not requiring any inhalers.  We  will continue cetirizine daily.  8. Ischemic colitis ([place]) Patient with history of ischemic colitis as well as collagenous colitis and irritable bowel syndrome.  She states she had 4 episodes of diarrhea yesterday.  She is MiraLAX as needed constipation.  We will continue to monitor for signs of infectious process however patient states her diarrhea has decreased over the last day.  She does continue to have some nausea we will continue to monitor.  No indication for antiemetic medication at this time.  9. Paroxysmal atrial fibrillation ([place]) Patient with history of paroxysmal A-fib that she is currently well controlled.  10. Lacunar stroke ([place]) Continue home aspirin and statin therapy as outlined above.  11. Primary hypertension Blood pressure well controlled without medications continue to monitor for elevated BP.  12. Major depressive disorder in partial remission, unspecified whether recurrent (HCC) Continue home sertraline 100 mg.  13. Arthritis Patient continues to have arthritic pain however is no longer taking muscle relaxers which she was praised for.  Continue scheduled Tylenol  14. Microscopic colitis, unspecified microscopic colitis type Patient with history of colitis not on current treatment continue to monitor for infectious process cute diarrhea for the last 24 hours.  15. Collagenous colitis Patient with history of colitis with treatment continue to monitor for infectious symptoms.  16. Closed avulsion fracture of navicular bone of left foot with routine healing, subsequent encounter Can experience navicular fracture and previous fall admitted to [facility] at that time therapy.  We will continue to monitor this injury is hindering current therapy regimen.  17. Multifactorial gait disorder Gait disorder likely most recent fall.  Patient's could be contributory however she has appropriate and added quit deprescribing here.  We will continue physical therapy as her  strength    Family/ staff Communication: Husband at bedside for majority of evaluation, nurses with medication changes  Labs/tests ordered: none  Tomasa Rand, MD, [place] [phone]

## 2022-06-27 ENCOUNTER — Non-Acute Institutional Stay (SKILLED_NURSING_FACILITY): Payer: Medicare PPO | Admitting: Student

## 2022-06-27 ENCOUNTER — Encounter: Payer: Self-pay | Admitting: Student

## 2022-06-27 DIAGNOSIS — S32030D Wedge compression fracture of third lumbar vertebra, subsequent encounter for fracture with routine healing: Secondary | ICD-10-CM | POA: Diagnosis not present

## 2022-06-27 DIAGNOSIS — J410 Simple chronic bronchitis: Secondary | ICD-10-CM

## 2022-06-27 DIAGNOSIS — S32030A Wedge compression fracture of third lumbar vertebra, initial encounter for closed fracture: Secondary | ICD-10-CM | POA: Insufficient documentation

## 2022-06-27 MED ORDER — ALBUTEROL SULFATE (2.5 MG/3ML) 0.083% IN NEBU: 2.5000 mg | INHALATION_SOLUTION | Freq: Four times a day (QID) | RESPIRATORY_TRACT | 1 refills | Status: DC | PRN

## 2022-06-27 NOTE — Progress Notes (Signed)
Location:   [place] Room Number: 053 Place of Service:  SNF [phone]) Provider:  Dewayne Shorter, MD  Ria Clock, MD  Patient Care Team: Ria Clock, MD as PCP - General (Internal Medicine) Minna Merritts, MD as Consulting Physician (Cardiology)  Extended Emergency Contact Information Primary Emergency Contact: Jaclyn Prime, [place] [address] [address]itter of [place] Phone: [phone] Mobile Phone: [phone] Relation: Spouse  Code Status:  DNR Goals of care: Advanced Directive information    06/27/2022   10:00 AM  Advanced Directives  Does Patient Have a Medical Advance Directive? Yes  Type of Advance Directive Out of facility DNR (pink MOST or yellow form)     Chief Complaint  Patient presents with   Acute Visit    Pain control    HPI:  Pt is a 76 y.o. female seen today for an acute visit for concern for oxygen requirement overnight and inadequate pain control.  Discussed with patient and husband at bedside concern for inconsistent medications taken at home versus what was discussed on Wednesday.  Patient apologized for missed communicating, she has been taking mirtazapine 30 mg nightly for insomnia.  She has not been taking memantine.  Her pain was not well controlled on the every 8 hour pain regimen, however she is much more comfortable with every 6 hours oxycodone as needed.  Patient states with regards to her breathing she has changes in breathing at home for which she uses a humidifier and air purifier.  She has had numerous allergy testing which have all come back negative.  Her pulmonologist has decreased her budesonide dose from 10 mg down to the current dose of 3 mg due to hyperglycemia.  She has tried ipratropium she has tried albuterol both without success in the past.  She has tried numerous other inhalers.  She is amenable to trialing ipratropium albuterol treatment while she is here with [place] for  wheezing so she can adequately participate in therapy.  Discussed possible need for x-ray patient states she does not think that that is what is happening at this time.   Past Medical History:  Diagnosis Date   Anemia    Anxiety    Arthritis    osteoarthritis   Asthma    due to seasonal alllergies   Atrial fibrillation (HCC)    Carpal tunnel syndrome    Cataract    Cervicalgia    Collagenous colitis 2010   COPD (chronic obstructive pulmonary disease) (HCC)    Depression    situational   Diabetes mellitus without complication (HCC)    Dysrhythmia    GERD (gastroesophageal reflux disease)    GI bleed    Headache(784.0)    migraines; 2 x month   Heart murmur    MVP ( symptomatic)   History of IBS    Hyperlipemia    Hypertension    on medication x 10 years   Migraine    MVP (mitral valve prolapse)    Shortness of breath    exertional   Sinoatrial node dysfunction ([place])    Sleep apnea 2007   does not use CPAP since 40 # wt loss   Stroke [facility])    Syncopal episodes    Past Surgical History:  Procedure Laterality Date   ABDOMINAL HYSTERECTOMY  1979   BACK SURGERY     BREAST CYST ASPIRATION Right 25 plus yrs ago   CARDIAC CATHETERIZATION  2012  [place]   CHOLECYSTECTOMY  2011   COLONOSCOPY WITH PROPOFOL N/A 03/25/2019   Procedure: COLONOSCOPY WITH PROPOFOL;  Surgeon: Lollie Sails, MD;  Location: [facility] ENDOSCOPY;  Service: Endoscopy;  Laterality: N/A;   COLONOSCOPY WITH PROPOFOL N/A 11/29/2020   Procedure: COLONOSCOPY WITH PROPOFOL;  Surgeon: Lesly Rubenstein, MD;  Location: [facility] ENDOSCOPY;  Service: Endoscopy;  Laterality: N/A;   ESOPHAGOGASTRODUODENOSCOPY (EGD) WITH PROPOFOL N/A 03/25/2019   Procedure: ESOPHAGOGASTRODUODENOSCOPY (EGD) WITH PROPOFOL;  Surgeon: Lollie Sails, MD;  Location[facility] ENDOSCOPY;  Service: Endoscopy;  Laterality: N/A;   ESOPHAGOGASTRODUODENOSCOPY (EGD) WITH PROPOFOL N/A 11/29/2020   Procedure: ESOPHAGOGASTRODUODENOSCOPY (EGD) WITH  PROPOFOL;  Surgeon: Lesly Rubenstein, MD;  Location: [facility] ENDOSCOPY;  Service: Endoscopy;  Laterality: N/A;   FLEXIBLE BRONCHOSCOPY N/A 10/10/2016   Procedure: FLEXIBLE BRONCHOSCOPY;  Surgeon: [name], MD;  Location: [facility] ORS;  Service: Pulmonary;  Laterality: N/A;   FLEXIBLE BRONCHOSCOPY N/A 12/11/2017   Procedure: FLEXIBLE BRONCHOSCOPY;  Surgeon: [name], MD;  Location: [facility] ORS;  Service: Pulmonary;  Laterality: N/A;   JOINT REPLACEMENT     KNEE ARTHROSCOPY  1985, 1990, 2012 x 2   bilateral   LUMBAR LAMINECTOMY  [id number]   NASAL SINUS SURGERY  1994   TONSILLECTOMY     TOTAL KNEE ARTHROPLASTY  12/22/2011   Procedure: TOTAL KNEE ARTHROPLASTY;  Surgeon: Rudean Haskell, MD;  Location: [place];  Service: Orthopedics;  Laterality: Left;    Allergies  Allergen Reactions   Contrast Media [Iodinated Contrast Media] Anaphylaxis   Gadolinium Derivatives Anaphylaxis   Gadoversetamide Anaphylaxis   Shellfish Allergy Nausea And Vomiting and Other (See Comments)    Can eat shrimp but not oysters Dizziness,severe nausea and vomiting ( can eat shrimp CAN NOT EAT ORYSTERS) Other reaction(s): Other (See Comments) Dizziness,severe nausea and vomiting ( can eat shrimp CAN NOT EAT ORYSTERS)  Can eat shrimp but not oysters Dizziness,severe nausea and vomiting ( can eat shrimp CAN NOT EAT ORYSTERS)  Can eat shrimp but not oysters    Sulfa Antibiotics Hives and Other (See Comments)    GI upset GI upset GI upset  GI upset GI upset GI upset Other reaction(s): Other (See Comments) GI upset GI upset  GI upset GI upset    Hydroxychloroquine Sulfate Hives and Other (See Comments)    Severe hives, all over like chicken pox.   Banana Nausea And Vomiting and Other (See Comments)    Raw bananas   Limonene Other (See Comments)    GI upset GI upset    Morphine And Related    Nabumetone Other (See Comments)    Hypertension   Otezla [Apremilast]    Potassium-Containing Compounds  Other (See Comments)    GI upset   Prednisone Nausea And Vomiting   Sulfasalazine Hives and Other (See Comments)    GI upset   Sulfonamide Derivatives Hives   Metronidazole Rash   Sulfamethoxazole Rash    Allergies as of 06/27/2022       Reactions   Contrast Media [iodinated Contrast Media] Anaphylaxis   Gadolinium Derivatives Anaphylaxis   Gadoversetamide Anaphylaxis   Shellfish Allergy Nausea And Vomiting, Other (See Comments)   Can eat shrimp but not oysters Dizziness,severe nausea and vomiting ( can eat shrimp CAN NOT EAT ORYSTERS) Other reaction(s): Other (See Comments) Dizziness,severe nausea and vomiting ( can eat shrimp CAN NOT EAT ORYSTERS) Can eat shrimp but not oysters Dizziness,severe nausea and vomiting ( can eat shrimp CAN NOT EAT ORYSTERS) Can eat shrimp  but not oysters   Sulfa Antibiotics Hives, Other (See Comments)   _0  GI upset Other reaction(s): Other (See Comments) GI upset GI upset GI upset GI upset   Hydroxychloroquine Sulfate Hives, Other (See Comments)   Severe hives, all over like chicken pox.   Banana Nausea And Vomiting, Other (See Comments)   Raw bananas   Limonene Other (See Comments)   GI upset GI upset   Morphine And Related    Nabumetone Other (See Comments)   Hypertension   Otezla [apremilast]    Potassium-containing Compounds Other (See Comments)   GI upset   Prednisone Nausea And Vomiting   Sulfasalazine Hives, Other (See Comments)   GI upset   Sulfonamide Derivatives Hives   Metronidazole Rash   Sulfamethoxazole Rash        Medication List        Accurate as of June 27, 2022 10:02 AM. If you have any questions, ask your nurse or doctor.          STOP taking these medications    carboxymethylcellulose 0.5 % Soln Commonly known as: REFRESH PLUS Stopped by: Dewayne Shorter, MD   EpiPen 2-Pak 0.3 mg/0.3 mL Soaj injection Generic drug: EPINEPHrine Stopped by:  Dewayne Shorter, MD       TAKE these medications    ALPRAZolam 0.5 MG tablet Commonly known as: XANAX Take 0.5 mg by mouth at bedtime. What changed: Another medication with the same name was removed. Continue taking this medication, and follow the directions you see here. Changed by: Dewayne Shorter, MD   aspirin 81 MG chewable tablet Chew 81 mg by mouth daily.   budesonide 3 MG 24 hr capsule Commonly known as: ENTOCORT EC Take 9 mg by mouth daily.   busPIRone 10 MG tablet Commonly known as: BUSPAR Take 1 tablet (10 mg total) by mouth 2 (two) times daily.   cetirizine 10 MG tablet Commonly known as: ZYRTEC TAKE 1 TABLET(10 MG) BY MOUTH AT BEDTIME   memantine 10 MG tablet Commonly known as: NAMENDA Take 10 mg by mouth at bedtime.   mirtazapine 15 MG tablet Commonly known as: REMERON Take 15 mg by mouth at bedtime.   Nurtec 75 MG Tbdp Generic drug: Rimegepant Sulfate Take 75 mg by mouth daily as needed for migraine.   oxyCODONE-acetaminophen 5-325 MG tablet Commonly known as: PERCOCET/ROXICET Take 1 tablet by mouth every 8 (eight) hours as needed for severe pain. What changed: when to take this   polyethylene glycol 17 g packet Commonly known as: MIRALAX / GLYCOLAX Take 17 g by mouth 2 (two) times daily.   rosuvastatin 40 MG tablet Commonly known as: CRESTOR Take 40 mg by mouth daily.   sertraline 100 MG tablet Commonly known as: ZOLOFT Take 1 tablet (100 mg total) by mouth at bedtime.        Review of Systems  Immunization History  Administered Date(s) Administered   Fluad Quad(high Dose 65+) 06/24/2019   Influenza Split 07/06/2013, 06/07/2014   Influenza, High Dose Seasonal PF 07/06/2016, 06/03/2017, 06/10/2018   Influenza,inj,Quad PF,6+ Mos 06/07/2015   Influenza-Unspecified 05/24/2015, 07/11/2016, 06/03/2017, 06/24/2019, 07/24/2020   Moderna Covid-19 Vaccine Bivalent Booster 34yr & up 06/22/2021   Moderna Sars-Covid-2 Vaccination 10/18/2019,  11/15/2019, 08/21/2020, 02/21/2021   PNEUMOCOCCAL CONJUGATE-20 05/15/2022   Pneumococcal Conjugate-13 05/06/2014   Pneumococcal Polysaccharide-23 10/06/2008   Td 10/06/2006   Td (Adult), 2 Lf Tetanus Toxid, Preservative Free 10/06/2006   Tdap 06/15/2014  Zoster Recombinat (Shingrix) 05/25/2018, 11/12/2018   Zoster, Live 10/06/2008   Pertinent  Health Maintenance Due  Topic Date Due   OPHTHALMOLOGY EXAM  12/04/2017   FOOT EXAM  12/09/2017   HEMOGLOBIN A1C  11/03/2019   INFLUENZA VACCINE  05/06/2022   MAMMOGRAM  02/05/2024   COLONOSCOPY (Pts 45-64yr Insurance coverage will need to be confirmed)  11/29/2030   DEXA SCAN  Completed      03/27/2022    8:00 AM 03/27/2022    8:00 PM 03/28/2022    8:00 AM 06/18/2022    2:14 PM 06/25/2022    9:20 AM  Fall Risk  Patient Fall Risk Level High fall risk High fall risk High fall risk Moderate fall risk Moderate fall risk  Patient at Risk for Falls Due to     History of fall(s)  Fall risk Follow up     Falls evaluation completed   Functional Status Survey:    Vitals:   06/27/22 0927  BP: (!) 104/56  Pulse: 96  Resp: 20  Temp: 98.1 F (36.7 C)  SpO2: 95%  Weight: 264 lb 3.2 oz (119.8 kg)  Height: _0  (1.753 m)   Body mass index is 39.02 kg/m. Physical Exam Constitutional:      Appearance: She is obese.  Cardiovascular:     Rate and Rhythm: Normal rate.     Pulses: Normal pulses.     Heart sounds: Normal heart sounds.  Pulmonary:     Comments: Patient able to speak in complete sentences breathing comfortably on 2 L nasal cannula.  And expiratory wheezes present. Abdominal:     General: Bowel sounds are normal.     Palpations: Abdomen is soft.  Neurological:     Mental Status: She is alert and oriented to person, place, and time.  Psychiatric:        Mood and Affect: Mood normal.     Labs reviewed: Recent Labs    08/07/21 0829 11/18/21 1232 11/19/21 0553 03/26/22 1508  NA 137 137 140  --   K 3.9 3.7 3.1*  --    CL 101 102 107  --   CO2 _1 --   GLUCOSE 159* 189* 134*  --   BUN _2 --   CREATININE 0.99 0.81 0.47 0.63  CALCIUM 9.3 10.0 9.3  --   MG  --  2.0  --   --    Recent Labs    11/18/21 1232  AST 23  ALT 12  ALKPHOS 67  BILITOT 1.1  PROT 7.1  ALBUMIN 3.9   Recent Labs    11/19/21 0553 03/26/22 1508 03/27/22 0939  WBC 4.8 5.9 7.0  HGB 12.8 13.1 12.4  HCT 39.9 41.4 38.7  MCV 102.0* 100.2* 99.2  PLT 130* 78* 150   Lab Results  Component Value Date   TSH 6.548 (H) 11/18/2021   Lab Results  Component Value Date   HGBA1C 6.3 (H) 05/03/2019   No results found for: "CHOL", "HDL", "LDLCALC", "LDLDIRECT", "TRIG", "CHOLHDL"  Significant Diagnostic Results in last 30 days:  CT Lumbar Spine Wo Contrast  Result Date: 06/18/2022 CLINICAL DATA:  Back trauma, no prior imaging (Age >= 16y).  Fall. EXAM: CT LUMBAR SPINE WITHOUT CONTRAST TECHNIQUE: Multidetector CT imaging of the lumbar spine was performed without intravenous contrast administration. Multiplanar CT image reconstructions were also generated. RADIATION DOSE REDUCTION: This exam was performed according to the departmental dose-optimization program which includes automated exposure control, adjustment  of the mA and/or kV according to patient size and/or use of iterative reconstruction technique. COMPARISON:  Lumbar spine MRI 10/02/2021 FINDINGS: Segmentation: 5 lumbar type vertebrae. Alignment: Mild lumbar levoscoliosis.  No listhesis. Vertebrae: Acute L3 superior endplate compression fracture with 20% vertebral body height loss. No retropulsion or posterior element fracture. No suspicious osseous lesion. Paraspinal and other soft tissues: Abdominal aortic atherosclerosis. Colonic diverticulosis. Disc levels: Similar appearance of lumbar disc and facet degeneration compared to the prior MRI. Mild spinal stenosis at L3-4 and L4-5. Mild neural foraminal stenosis at L4-5 and L5-S1. Prior right laminectomies at L4-5 and  L5-S1. IMPRESSION: 1. Acute L3 compression fracture with 20% height loss. 2. Lumbar disc and facet degeneration, similar to the 09/24/2021 MRI. 3. Aortic Atherosclerosis (ICD10-I70.0). Electronically Signed   By: Logan Bores M.D.   On: 06/18/2022 16:33   CT Head Wo Contrast  Result Date: 06/18/2022 CLINICAL DATA:  Polytrauma, blunt.  Fall last night. EXAM: CT HEAD WITHOUT CONTRAST CT CERVICAL SPINE WITHOUT CONTRAST TECHNIQUE: Multidetector CT imaging of the head and cervical spine was performed following the standard protocol without intravenous contrast. Multiplanar CT image reconstructions of the cervical spine were also generated. RADIATION DOSE REDUCTION: This exam was performed according to the departmental dose-optimization program which includes automated exposure control, adjustment of the mA and/or kV according to patient size and/or use of iterative reconstruction technique. COMPARISON:  Head MRI 12/31/2015. Maxillofacial CT 10/27/2017. Cervical spine CT 07/02/2011. FINDINGS: CT HEAD FINDINGS Brain: There is no evidence of an acute infarct, intracranial hemorrhage, mass, midline shift, or extra-axial fluid collection. A small chronic infarct in the right basal ganglia is unchanged from the prior MRI. A moderately large chronic right PCA infarct is new. There is associated mild ex vacuo dilatation of the occipital horn of the right lateral ventricle. The ventricles are otherwise normal in size. Vascular: Calcified atherosclerosis at the skull base. No hyperdense vessel. Skull: No fracture or suspicious osseous lesion. Sinuses/Orbits: Visualized paranasal sinuses and mastoid air cells are clear. Bilateral cataract extraction. Other: None. CT CERVICAL SPINE FINDINGS Alignment: Straightening of the normal cervical lordosis with minimal anterolisthesis of C2 on C3, C3 on C4, C4 on C5, and C5 on C6, likely degenerative. Skull base and vertebrae: No acute fracture or suspicious osseous lesion. Moderate  median C1-2 arthropathy. Soft tissues and spinal canal: No prevertebral fluid or swelling. No visible canal hematoma. Disc levels: Mild cervical spondylosis. Diffuse facet arthrosis, moderate to severe in the mid and upper cervical spine. Asymmetrically severe neural foraminal stenosis on the right at C3-4 and C5-6 due to uncovertebral and facet spurring. Upper chest: Negative. Other: Mild calcific atherosclerosis at the carotid bifurcations. IMPRESSION: 1. No evidence of acute intracranial abnormality. 2. 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 3. No acute cervical spine fracture. Electronically Signed   By: Logan Bores M.D.   On: 06/18/2022 16:26   CT Cervical Spine Wo Contrast  Result Date: 06/18/2022 CLINICAL DATA:  Polytrauma, blunt.  Fall last night. EXAM: CT HEAD WITHOUT CONTRAST CT CERVICAL SPINE WITHOUT CONTRAST TECHNIQUE: Multidetector CT imaging of the head and cervical spine was performed following the standard protocol without intravenous contrast. Multiplanar CT image reconstructions of the cervical spine were also generated. RADIATION DOSE REDUCTION: This exam was performed according to the departmental dose-optimization program which includes automated exposure control, adjustment of the mA and/or kV according to patient size and/or use of iterative reconstruction technique. COMPARISON:  Head MRI 12/31/2015. Maxillofacial CT 10/27/2017. Cervical spine CT 07/02/2011.  FINDINGS: CT HEAD FINDINGS Brain: There is no evidence of an acute infarct, intracranial hemorrhage, mass, midline shift, or extra-axial fluid collection. A small chronic infarct in the right basal ganglia is unchanged from the prior MRI. A moderately large chronic right PCA infarct is new. There is associated mild ex vacuo dilatation of the occipital horn of the right lateral ventricle. The ventricles are otherwise normal in size. Vascular: Calcified atherosclerosis at the skull base. No hyperdense vessel. Skull: No  fracture or suspicious osseous lesion. Sinuses/Orbits: Visualized paranasal sinuses and mastoid air cells are clear. Bilateral cataract extraction. Other: None. CT CERVICAL SPINE FINDINGS Alignment: Straightening of the normal cervical lordosis with minimal anterolisthesis of C2 on C3, C3 on C4, C4 on C5, and C5 on C6, likely degenerative. Skull base and vertebrae: No acute fracture or suspicious osseous lesion. Moderate median C1-2 arthropathy. Soft tissues and spinal canal: No prevertebral fluid or swelling. No visible canal hematoma. Disc levels: Mild cervical spondylosis. Diffuse facet arthrosis, moderate to severe in the mid and upper cervical spine. Asymmetrically severe neural foraminal stenosis on the right at C3-4 and C5-6 due to uncovertebral and facet spurring. Upper chest: Negative. Other: Mild calcific atherosclerosis at the carotid bifurcations. IMPRESSION: 1. No evidence of acute intracranial abnormality. 2. 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 3. No acute cervical spine fracture. Electronically Signed   By: Logan Bores M.D.   On: 06/18/2022 16:26   DG Hip Unilat W or Wo Pelvis 2-3 Views Right  Result Date: 06/18/2022 CLINICAL DATA:  Fall with hip pain EXAM: DG HIP (WITH OR WITHOUT PELVIS) 2-3V RIGHT COMPARISON:  03/26/2022 FINDINGS: SI joints are non widened. Pubic symphysis and rami appear intact. No definitive fracture or malalignment. IMPRESSION: No definite acute osseous abnormality Electronically Signed   By: Donavan Foil M.D.   On: 06/18/2022 16:13   DG Shoulder Right  Result Date: 06/18/2022 CLINICAL DATA:  Status post fall.  Right shoulder pain. EXAM: RIGHT SHOULDER - 2+ VIEW COMPARISON:  None Available. FINDINGS: There is no evidence of fracture or dislocation. Moderate degenerative changes noted at the acromioclavicular joint. Soft tissues are unremarkable. IMPRESSION: 1. No acute findings. 2. AC joint osteoarthritis. Electronically Signed   By: Kerby Moors M.D.    On: 06/18/2022 16:11   DG Hip Unilat W or Wo Pelvis 2-3 Views Left  Result Date: 06/18/2022 CLINICAL DATA:  Fall with hip pain EXAM: DG HIP (WITH OR WITHOUT PELVIS) 2-3V LEFT COMPARISON:  03/26/2022 FINDINGS: SI joints are non widened. Pubic symphysis and rami appear intact. No definitive fracture or malalignment. IMPRESSION: No definite acute osseous abnormality Electronically Signed   By: Donavan Foil M.D.   On: 06/18/2022 16:11    Assessment/Plan 1. Simple chronic bronchitis ([place]) Patient became symptomatic of her chronic bronchitis overnight requiring 2 L nasal cannula.  Patient wheezing on exam today.  We will continue budesonide 3 mg daily.  We will add ipratropium 2.5 mg albuterol 3 mg every 6 hours as needed for wheezing.  Patient remains on 2 L nasal cannula.  She also has a history of OSA and does not wear CPAP per patient preference.  Patient declined a chest x-ray today to evaluate for pneumonia, however low suspicion given no fever or crackles on exam.  2. Closed compression fracture of L3 lumbar vertebra with routine healing, subsequent encounter Patient is here for therapy for her lumbar fractures noted on 9/13.  Pain well controlled with oxycodone 5 mg every 6 hours..  We  will continue to monitor for symptoms.   Family/ staff Communication: Has been very updated at bedside and the nurses also updated. Labs/tests ordered:   None  Tomasa Rand, MD, [facility] [phone]

## 2022-07-01 ENCOUNTER — Non-Acute Institutional Stay (SKILLED_NURSING_FACILITY): Payer: Medicare PPO | Admitting: Nurse Practitioner

## 2022-07-01 ENCOUNTER — Encounter: Payer: Self-pay | Admitting: Nurse Practitioner

## 2022-07-01 DIAGNOSIS — I480 Paroxysmal atrial fibrillation: Secondary | ICD-10-CM

## 2022-07-01 DIAGNOSIS — J40 Bronchitis, not specified as acute or chronic: Secondary | ICD-10-CM | POA: Diagnosis not present

## 2022-07-01 DIAGNOSIS — S32030D Wedge compression fracture of third lumbar vertebra, subsequent encounter for fracture with routine healing: Secondary | ICD-10-CM | POA: Diagnosis not present

## 2022-07-01 NOTE — Progress Notes (Signed)
Location:  Other [facility]) Nursing Home Room Number: 119-A Place of Service:  SNF [phone]) Provider:  Carlos American. Dewaine Oats, NP   Patient Care Team: Dewayne Shorter, MD as PCP - General (Family Medicine) Minna Merritts, MD as Consulting Physician (Cardiology)  Extended Emergency Contact Information Primary Emergency Contact: Jaclyn Prime, [place] [address] Johnnette Litter of [place] Phone: [phone] Mobile Phone: [phone] Relation: Spouse  Code Status:  DNR Goals of care: Advanced Directive information    07/01/2022    2:59 PM  Advanced Directives  Does Patient Have a Medical Advance Directive? Yes  Type of Advance Directive Out of facility DNR (pink MOST or yellow form)  Does patient want to make changes to medical advance directive? No - Patient declined     Chief Complaint  Patient presents with   Acute Visit    Not feeling well     HPI:  Pt is a 76 y.o. female seen today for an acute visit for not feeling well. Nursing reports she has been more fatigued and less engaged in her usual activities recently.  Dr Unk Lightning saw her on 9/22 for acute bronchitis and pt reports symptoms have worsen since she was seen. States last night she woke up feeling her chest being tight.  Increase in shortness of breath and O2 dropped but improved this morning. She was placed on O2 at 2L to maintain.  No fever or chills noted.   Reports she was previously on metoprolol but after her last ED visit the medication was no longer on her list however she was taking at home. Therapy noted increase in HR was minimal activity. She has been followed and undergoing workup by cardiology due to a fib.   She has had multiple falls and continues  to work with PT. Husband was helping her a lot at home.   Past Medical History:  Diagnosis Date   Anemia    Anxiety    Arthritis    osteoarthritis   Asthma    due to seasonal alllergies   Atrial fibrillation (HCC)    Carpal tunnel  syndrome    Cataract    Cervicalgia    Collagenous colitis 2010   COPD (chronic obstructive pulmonary disease) (HCC)    Depression    situational   Diabetes mellitus without complication (HCC)    Dysrhythmia    GERD (gastroesophageal reflux disease)    GI bleed    Headache(784.0)    migraines; 2 x month   Heart murmur    MVP ( symptomatic)   History of IBS    Hyperlipemia    Hypertension    on medication x 10 years   Migraine    MVP (mitral valve prolapse)    Shortness of breath    exertional   Sinoatrial node dysfunction ([place])    Sleep apnea 2007   does not use CPAP since 40 # wt loss   Stroke [facility])    Syncopal episodes    Past Surgical History:  Procedure Laterality Date   ABDOMINAL HYSTERECTOMY  1979   BACK SURGERY     BREAST CYST ASPIRATION Right 25 plus yrs ago   CARDIAC CATHETERIZATION  2012   [place]  2011   COLONOSCOPY WITH PROPOFOL N/A 03/25/2019   Procedure: COLONOSCOPY WITH PROPOFOL;  Surgeon: Lollie Sails, MD;  Location: [facility] ENDOSCOPY;  Service: Endoscopy;  Laterality: N/A;   COLONOSCOPY WITH PROPOFOL N/A 11/29/2020  Procedure: COLONOSCOPY WITH PROPOFOL;  Surgeon: Lesly Rubenstein, MD;  Location: [facility] ENDOSCOPY;  Service: Endoscopy;  Laterality: N/A;   ESOPHAGOGASTRODUODENOSCOPY (EGD) WITH PROPOFOL N/A 03/25/2019   Procedure: ESOPHAGOGASTRODUODENOSCOPY (EGD) WITH PROPOFOL;  Surgeon: Lollie Sails, MD;  Location: [facility] ENDOSCOPY;  Service: Endoscopy;  Laterality: N/A;   ESOPHAGOGASTRODUODENOSCOPY (EGD) WITH PROPOFOL N/A 11/29/2020   Procedure: ESOPHAGOGASTRODUODENOSCOPY (EGD) WITH PROPOFOL;  Surgeon: Lesly Rubenstein, MD;  Location: [facility] ENDOSCOPY;  Service: Endoscopy;  Laterality: N/A;   FLEXIBLE BRONCHOSCOPY N/A 10/10/2016   Procedure: FLEXIBLE BRONCHOSCOPY;  Surgeon: [name], MD;  Location: [facility] ORS;  Service: Pulmonary;  Laterality: N/A;   FLEXIBLE BRONCHOSCOPY N/A 12/11/2017   Procedure: FLEXIBLE BRONCHOSCOPY;  Surgeon:  [name], MD;  Location: [facility] ORS;  Service: Pulmonary;  Laterality: N/A;   JOINT REPLACEMENT     KNEE ARTHROSCOPY  1985, 1990, 2012 x 2   bilateral   LUMBAR LAMINECTOMY  6213,[id number]   NASAL SINUS SURGERY  1994   TONSILLECTOMY     TOTAL KNEE ARTHROPLASTY  12/22/2011   Procedure: TOTAL KNEE ARTHROPLASTY;  Surgeon: Rudean Haskell, MD;  Location: [facility];  Service: Orthopedics;  Laterality: Left;    Allergies  Allergen Reactions   Contrast Media [Iodinated Contrast Media] Anaphylaxis   Gadolinium Derivatives Anaphylaxis   Gadoversetamide Anaphylaxis   Shellfish Allergy Nausea And Vomiting and Other (See Comments)    Can eat shrimp but not oysters Dizziness,severe nausea and vomiting ( can eat shrimp CAN NOT EAT ORYSTERS) Other reaction(s): Other (See Comments) Dizziness,severe nausea and vomiting ( can eat shrimp CAN NOT EAT ORYSTERS)  Can eat shrimp but not oysters Dizziness,severe nausea and vomiting ( can eat shrimp CAN NOT EAT ORYSTERS)  Can eat shrimp but not oysters    Sulfa Antibiotics Hives and Other (See Comments)    GI upset GI upset GI upset  GI upset GI upset GI upset Other reaction(s): Other (See Comments) GI upset GI upset  GI upset GI upset    Hydroxychloroquine Sulfate Hives and Other (See Comments)    Severe hives, all over like chicken pox.   Banana Nausea And Vomiting and Other (See Comments)    Raw bananas   Limonene Other (See Comments)    GI upset GI upset    Morphine And Related    Nabumetone Other (See Comments)    Hypertension   Otezla [Apremilast]    Potassium-Containing Compounds Other (See Comments)    GI upset   Prednisone Nausea And Vomiting   Sulfasalazine Hives and Other (See Comments)    GI upset   Sulfonamide Derivatives Hives   Metronidazole Rash   Sulfamethoxazole Rash    Outpatient Encounter Medications as of 07/01/2022  Medication Sig   ALPRAZolam (XANAX) 0.5 MG tablet Take 0.5 mg by mouth at bedtime.   aspirin  81 MG chewable tablet Chew 81 mg by mouth daily.   bismuth subsalicylate (PEPTO BISMOL) 262 MG/15ML suspension Take 30 mLs by mouth every 4 (four) hours as needed for diarrhea or loose stools.   budesonide (ENTOCORT EC) 3 MG 24 hr capsule Take 9 mg by mouth daily.   busPIRone (BUSPAR) 10 MG tablet Take 1 tablet (10 mg total) by mouth 2 (two) times daily.   Carboxymethylcellulose Sod PF 0.5 % SOLN Apply 1 drop to eye 2 (two) times daily. Both eyes   cetirizine (ZYRTEC) 10 MG tablet TAKE 1 TABLET(10 MG) BY MOUTH AT BEDTIME   dextromethorphan-guaiFENesin (MUCINEX DM) 30-600 MG 12hr tablet Take  1 tablet by mouth 2 (two) times daily.   doxycycline (VIBRA-TABS) 100 MG tablet Take 100 mg by mouth 2 (two) times daily.   ipratropium-albuterol (DUONEB) 0.5-2.5 (3) MG/3ML SOLN Take 3 mLs by nebulization every 6 (six) hours as needed (For wheezing).   memantine (NAMENDA) 10 MG tablet Take 10 mg by mouth 2 (two) times daily.   metoprolol tartrate (LOPRESSOR) 25 MG tablet Take 25 mg by mouth 2 (two) times daily.   mirtazapine (REMERON) 30 MG tablet Take 30 mg by mouth at bedtime.   NURTEC 75 MG TBDP Take 75 mg by mouth daily as needed for migraine.   oxyCODONE-acetaminophen (PERCOCET/ROXICET) 5-325 MG tablet Take 1 tablet by mouth every 6 (six) hours as needed for severe pain.   polyethylene glycol (MIRALAX / GLYCOLAX) 17 g packet Take 17 g by mouth 2 (two) times daily.   rosuvastatin (CRESTOR) 40 MG tablet Take 40 mg by mouth daily.   sertraline (ZOLOFT) 100 MG tablet Take 1 tablet (100 mg total) by mouth at bedtime.   [DISCONTINUED] albuterol (PROVENTIL) (2.5 MG/3ML) 0.083% nebulizer solution Take 3 mLs (2.5 mg total) by nebulization every 6 (six) hours as needed for wheezing or shortness of breath.   [DISCONTINUED] mirtazapine (REMERON) 15 MG tablet Take 30 mg by mouth at bedtime.   [DISCONTINUED] oxyCODONE-acetaminophen (PERCOCET/ROXICET) 5-325 MG tablet Take 1 tablet by mouth every 8 (eight) hours as  needed for severe pain.   No facility-administered encounter medications on file as of 07/01/2022.    Review of Systems  Constitutional:  Positive for activity change and fatigue. Negative for appetite change, chills, fever and unexpected weight change.  HENT:  Negative for congestion and hearing loss.   Eyes: Negative.   Respiratory:  Positive for cough, chest tightness, shortness of breath and wheezing.   Cardiovascular:  Positive for palpitations. Negative for chest pain and leg swelling.  Gastrointestinal:  Negative for abdominal pain, constipation and diarrhea.  Genitourinary:  Negative for difficulty urinating and dysuria.  Musculoskeletal:  Positive for arthralgias.  Skin:  Negative for color change and wound.  Neurological:  Positive for weakness. Negative for dizziness.  Psychiatric/Behavioral:  Negative for agitation, behavioral problems and confusion.     Immunization History  Administered Date(s) Administered   Fluad Quad(high Dose 65+) 06/24/2019   Influenza Split 07/06/2013, 06/07/2014   Influenza, High Dose Seasonal PF 07/06/2016, 06/03/2017, 06/10/2018   Influenza,inj,Quad PF,6+ Mos 06/07/2015   Influenza-Unspecified 05/24/2015, 07/11/2016, 06/03/2017, 06/24/2019, 07/24/2020   Moderna Covid-19 Vaccine Bivalent Booster 33yr & up 06/22/2021   Moderna Sars-Covid-2 Vaccination 10/18/2019, 11/15/2019, 08/21/2020, 02/21/2021   PNEUMOCOCCAL CONJUGATE-20 05/15/2022   Pneumococcal Conjugate-13 05/06/2014   Pneumococcal Polysaccharide-23 10/06/2008   Td 10/06/2006   Td (Adult), 2 Lf Tetanus Toxid, Preservative Free 10/06/2006   Tdap 06/15/2014   Zoster Recombinat (Shingrix) 05/25/2018, 11/12/2018   Zoster, Live 10/06/2008   Pertinent  Health Maintenance Due  Topic Date Due   OPHTHALMOLOGY EXAM  12/04/2017   FOOT EXAM  12/09/2017   HEMOGLOBIN A1C  11/03/2019   INFLUENZA VACCINE  05/06/2022   MAMMOGRAM  02/05/2024   COLONOSCOPY (Pts 45-485yrInsurance coverage will  need to be confirmed)  11/29/2030   DEXA SCAN  Completed      03/27/2022    8:00 AM 03/27/2022    8:00 PM 03/28/2022    8:00 AM 06/18/2022    2:14 PM 06/25/2022    9:20 AM  Fall Risk  Patient Fall Risk Level High fall risk High fall risk High  fall risk Moderate fall risk Moderate fall risk  Patient at Risk for Falls Due to     History of fall(s)  Fall risk Follow up     Falls evaluation completed   Functional Status Survey:    Vitals:   07/01/22 1444  BP: 120/76  Pulse: 82  Resp: 16  Temp: (!) 96.1 F (35.6 C)  SpO2: 93%  Weight: 264 lb (119.7 kg)  Height: _0  (1.753 m)   Body mass index is 38.99 kg/m. Physical Exam Constitutional:      General: She is not in acute distress.    Appearance: She is well-developed. She is not diaphoretic.  HENT:     Head: Normocephalic and atraumatic.     Mouth/Throat:     Pharynx: No oropharyngeal exudate.  Eyes:     Conjunctiva/sclera: Conjunctivae normal.     Pupils: Pupils are equal, round, and reactive to light.  Cardiovascular:     Rate and Rhythm: Normal rate and regular rhythm.     Heart sounds: Normal heart sounds.  Pulmonary:     Effort: Pulmonary effort is normal.     Breath sounds: Wheezing (throughout) present.  Abdominal:     General: Bowel sounds are normal.     Palpations: Abdomen is soft.  Musculoskeletal:     Cervical back: Normal range of motion and neck supple.     Right lower leg: No edema.     Left lower leg: No edema.  Skin:    General: Skin is warm and dry.  Neurological:     Mental Status: She is alert.  Psychiatric:        Mood and Affect: Mood normal.     Labs reviewed: Recent Labs    08/07/21 0829 11/18/21 1232 11/19/21 0553 03/26/22 1508 04/03/22 0000 04/10/22 0000  NA 137 137 140  --  128* 131*  131*  K 3.9 3.7 3.1*  --  4.4 4.1  4.1  CL 101 102 107  --  94* 97*  97*  CO2 _1 --  29* 31*  31*  GLUCOSE 159* 189* 134*  --   --   --   BUN _2 --  _3 CREATININE  0.99 0.81 0.47 0.63 0.8 0.6  0.6  CALCIUM 9.3 10.0 9.3  --  9.5 9.5  9.5  MG  --  2.0  --   --   --   --    Recent Labs    11/18/21 1232  AST 23  ALT 12  ALKPHOS 67  BILITOT 1.1  PROT 7.1  ALBUMIN 3.9   Recent Labs    11/19/21 0553 03/26/22 1508 03/27/22 0939 04/10/22 0000  WBC 4.8 5.9 7.0 4.0  4.0  NEUTROABS  --   --   --  2,688.00  2,688.00  HGB 12.8 13.1 12.4 11.7*  11.7*  HCT 39.9 41.4 38.7 35*  35*  MCV 102.0* 100.2* 99.2  --   PLT 130* 78* 150 149*  149*   Lab Results  Component Value Date   TSH 6.548 (H) 11/18/2021   Lab Results  Component Value Date   HGBA1C 6.3 (H) 05/03/2019   No results found for: "CHOL", "HDL", "LDLCALC", "LDLDIRECT", "TRIG", "CHOLHDL"  Significant Diagnostic Results in last 30 days:  CT Lumbar Spine Wo Contrast  Result Date: 06/18/2022 CLINICAL DATA:  Back trauma, no prior imaging (Age >= 16y).  Fall. EXAM: CT LUMBAR SPINE WITHOUT CONTRAST  TECHNIQUE: Multidetector CT imaging of the lumbar spine was performed without intravenous contrast administration. Multiplanar CT image reconstructions were also generated. RADIATION DOSE REDUCTION: This exam was performed according to the departmental dose-optimization program which includes automated exposure control, adjustment of the mA and/or kV according to patient size and/or use of iterative reconstruction technique. COMPARISON:  Lumbar spine MRI 10/02/2021 FINDINGS: Segmentation: 5 lumbar type vertebrae. Alignment: Mild lumbar levoscoliosis.  No listhesis. Vertebrae: Acute L3 superior endplate compression fracture with 20% vertebral body height loss. No retropulsion or posterior element fracture. No suspicious osseous lesion. Paraspinal and other soft tissues: Abdominal aortic atherosclerosis. Colonic diverticulosis. Disc levels: Similar appearance of lumbar disc and facet degeneration compared to the prior MRI. Mild spinal stenosis at L3-4 and L4-5. Mild neural foraminal stenosis at L4-5 and  L5-S1. Prior right laminectomies at L4-5 and L5-S1. IMPRESSION: 1. Acute L3 compression fracture with 20% height loss. 2. Lumbar disc and facet degeneration, similar to the 09/24/2021 MRI. 3. Aortic Atherosclerosis (ICD10-I70.0). Electronically Signed   By: Logan Bores M.D.   On: 06/18/2022 16:33   CT Head Wo Contrast  Result Date: 06/18/2022 CLINICAL DATA:  Polytrauma, blunt.  Fall last night. EXAM: CT HEAD WITHOUT CONTRAST CT CERVICAL SPINE WITHOUT CONTRAST TECHNIQUE: Multidetector CT imaging of the head and cervical spine was performed following the standard protocol without intravenous contrast. Multiplanar CT image reconstructions of the cervical spine were also generated. RADIATION DOSE REDUCTION: This exam was performed according to the departmental dose-optimization program which includes automated exposure control, adjustment of the mA and/or kV according to patient size and/or use of iterative reconstruction technique. COMPARISON:  Head MRI 12/31/2015. Maxillofacial CT 10/27/2017. Cervical spine CT 07/02/2011. FINDINGS: CT HEAD FINDINGS Brain: There is no evidence of an acute infarct, intracranial hemorrhage, mass, midline shift, or extra-axial fluid collection. A small chronic infarct in the right basal ganglia is unchanged from the prior MRI. A moderately large chronic right PCA infarct is new. There is associated mild ex vacuo dilatation of the occipital horn of the right lateral ventricle. The ventricles are otherwise normal in size. Vascular: Calcified atherosclerosis at the skull base. No hyperdense vessel. Skull: No fracture or suspicious osseous lesion. Sinuses/Orbits: Visualized paranasal sinuses and mastoid air cells are clear. Bilateral cataract extraction. Other: None. CT CERVICAL SPINE FINDINGS Alignment: Straightening of the normal cervical lordosis with minimal anterolisthesis of C2 on C3, C3 on C4, C4 on C5, and C5 on C6, likely degenerative. Skull base and vertebrae: No acute fracture  or suspicious osseous lesion. Moderate median C1-2 arthropathy. Soft tissues and spinal canal: No prevertebral fluid or swelling. No visible canal hematoma. Disc levels: Mild cervical spondylosis. Diffuse facet arthrosis, moderate to severe in the mid and upper cervical spine. Asymmetrically severe neural foraminal stenosis on the right at C3-4 and C5-6 due to uncovertebral and facet spurring. Upper chest: Negative. Other: Mild calcific atherosclerosis at the carotid bifurcations. IMPRESSION: 1. No evidence of acute intracranial abnormality. 2. 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 3. No acute cervical spine fracture. Electronically Signed   By: Logan Bores M.D.   On: 06/18/2022 16:26   CT Cervical Spine Wo Contrast  Result Date: 06/18/2022 CLINICAL DATA:  Polytrauma, blunt.  Fall last night. EXAM: CT HEAD WITHOUT CONTRAST CT CERVICAL SPINE WITHOUT CONTRAST TECHNIQUE: Multidetector CT imaging of the head and cervical spine was performed following the standard protocol without intravenous contrast. Multiplanar CT image reconstructions of the cervical spine were also generated. RADIATION DOSE REDUCTION: This exam was  performed according to the departmental dose-optimization program which includes automated exposure control, adjustment of the mA and/or kV according to patient size and/or use of iterative reconstruction technique. COMPARISON:  Head MRI 12/31/2015. Maxillofacial CT 10/27/2017. Cervical spine CT 07/02/2011. FINDINGS: CT HEAD FINDINGS Brain: There is no evidence of an acute infarct, intracranial hemorrhage, mass, midline shift, or extra-axial fluid collection. A small chronic infarct in the right basal ganglia is unchanged from the prior MRI. A moderately large chronic right PCA infarct is new. There is associated mild ex vacuo dilatation of the occipital horn of the right lateral ventricle. The ventricles are otherwise normal in size. Vascular: Calcified atherosclerosis at the skull  base. No hyperdense vessel. Skull: No fracture or suspicious osseous lesion. Sinuses/Orbits: Visualized paranasal sinuses and mastoid air cells are clear. Bilateral cataract extraction. Other: None. CT CERVICAL SPINE FINDINGS Alignment: Straightening of the normal cervical lordosis with minimal anterolisthesis of C2 on C3, C3 on C4, C4 on C5, and C5 on C6, likely degenerative. Skull base and vertebrae: No acute fracture or suspicious osseous lesion. Moderate median C1-2 arthropathy. Soft tissues and spinal canal: No prevertebral fluid or swelling. No visible canal hematoma. Disc levels: Mild cervical spondylosis. Diffuse facet arthrosis, moderate to severe in the mid and upper cervical spine. Asymmetrically severe neural foraminal stenosis on the right at C3-4 and C5-6 due to uncovertebral and facet spurring. Upper chest: Negative. Other: Mild calcific atherosclerosis at the carotid bifurcations. IMPRESSION: 1. No evidence of acute intracranial abnormality. 2. 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 3. No acute cervical spine fracture. Electronically Signed   By: Logan Bores M.D.   On: 06/18/2022 16:26   DG Hip Unilat W or Wo Pelvis 2-3 Views Right  Result Date: 06/18/2022 CLINICAL DATA:  Fall with hip pain EXAM: DG HIP (WITH OR WITHOUT PELVIS) 2-3V RIGHT COMPARISON:  03/26/2022 FINDINGS: SI joints are non widened. Pubic symphysis and rami appear intact. No definitive fracture or malalignment. IMPRESSION: No definite acute osseous abnormality Electronically Signed   By: Donavan Foil M.D.   On: 06/18/2022 16:13   DG Shoulder Right  Result Date: 06/18/2022 CLINICAL DATA:  Status post fall.  Right shoulder pain. EXAM: RIGHT SHOULDER - 2+ VIEW COMPARISON:  None Available. FINDINGS: There is no evidence of fracture or dislocation. Moderate degenerative changes noted at the acromioclavicular joint. Soft tissues are unremarkable. IMPRESSION: 1. No acute findings. 2. AC joint osteoarthritis.  Electronically Signed   By: Kerby Moors M.D.   On: 06/18/2022 16:11   DG Hip Unilat W or Wo Pelvis 2-3 Views Left  Result Date: 06/18/2022 CLINICAL DATA:  Fall with hip pain EXAM: DG HIP (WITH OR WITHOUT PELVIS) 2-3V LEFT COMPARISON:  03/26/2022 FINDINGS: SI joints are non widened. Pubic symphysis and rami appear intact. No definitive fracture or malalignment. IMPRESSION: No definite acute osseous abnormality Electronically Signed   By: Donavan Foil M.D.   On: 06/18/2022 16:11    Assessment/Plan 1. Bronchitis -will get chest xray to rule out pneumonia -doxycycline 100 mg PO BID x 7 days - mucinex DM PO Bid with full glass water for 7 days -to scheduled duonebs q 6 hours while awake x 5days -will update cbc and cmp   2. Paroxysmal atrial fibrillation (HCC) Appears she was previously taking metoprolol 75 mg BID, will restart metoprolol 25 mg BID at this time and staff to monitor bp and HR  3. Closed compression fracture of L3 lumbar vertebra with routine healing, subsequent encounter Pain somewhat  controlled, states laying in bed helps with pain control. Educated on need to be getting out of bed, deep breathing to avoid complications from bronchitis.  -continues to work with PT  Janett Billow K. Meyer, [facility] [phone]

## 2022-07-10 ENCOUNTER — Ambulatory Visit: Payer: Self-pay | Admitting: Orthopedic Surgery

## 2022-07-21 ENCOUNTER — Other Ambulatory Visit: Payer: Self-pay

## 2022-07-21 DIAGNOSIS — S32030A Wedge compression fracture of third lumbar vertebra, initial encounter for closed fracture: Secondary | ICD-10-CM

## 2022-08-07 NOTE — Progress Notes (Signed)
Referring Physician:  Darletta Moll, PA-C [place],  [place] [address]  Primary Physician:  Ria Clock, MD  History of Present Illness: 08/07/2022 Caitlin Leonard was seen in ED on 06/18/22 with back pain s/p fall on 06/17/22. Found to have L3 compression fracture. ED spoke with Dr. Izora Ribas who advised LSO brace and outpatient follow up.   She states she has been wearing LSO, but did not wear it into the office. She was in SNF and now is in HHPT 3 days a week. Does not think brace helps with pain.   She continues with constant LBP and intermittent bilateral leg pain posterior to her feet (can be right or left leg, usually not both). No numbness or tingling. She has weakness in both legs. History of chronic back and leg pain x years. She feels like her back pain is getting worse.   Given percocet and robaxin from the ED. Told to only take 1 percocet a day and this is not helping.   Conservative measures:  Physical therapy: she is in HHPT  Multimodal medical therapy including regular antiinflammatories: robaxin, percocet  Injections: No recent epidural steroid injections  Past Surgery: lumbar laminectomy x 2 years ago  The symptoms are causing a significant impact on the patient's life.   Review of Systems:  A 10 point review of systems is negative, except for the pertinent positives and negatives detailed in the HPI.  Past Medical History: Past Medical History:  Diagnosis Date   Anemia    Anxiety    Arthritis    osteoarthritis   Asthma    due to seasonal alllergies   Atrial fibrillation (HCC)    Carpal tunnel syndrome    Cataract    Cervicalgia    Collagenous colitis 2010   COPD (chronic obstructive pulmonary disease) (HCC)    Depression    situational   Diabetes mellitus without complication (HCC)    Dysrhythmia    GERD (gastroesophageal reflux disease)    GI bleed    Headache(784.0)    migraines; 2 x month   Heart murmur    MVP (  symptomatic)   History of IBS    Hyperlipemia    Hypertension    on medication x 10 years   Migraine    MVP (mitral valve prolapse)    Shortness of breath    exertional   Sinoatrial node dysfunction ([place])    Sleep apnea 2007   does not use CPAP since 40 # wt loss   Stroke [facility])    Syncopal episodes     Past Surgical History: Past Surgical History:  Procedure Laterality Date   ABDOMINAL HYSTERECTOMY  1979   BACK SURGERY     BREAST CYST ASPIRATION Right 25 plus yrs ago   CARDIAC CATHETERIZATION  2012   [place]  2011   COLONOSCOPY WITH PROPOFOL N/A 03/25/2019   Procedure: COLONOSCOPY WITH PROPOFOL;  Surgeon: Lollie Sails, MD;  Location: [facility] ENDOSCOPY;  Service: Endoscopy;  Laterality: N/A;   COLONOSCOPY WITH PROPOFOL N/A 11/29/2020   Procedure: COLONOSCOPY WITH PROPOFOL;  Surgeon: Lesly Rubenstein, MD;  Location: [facility] ENDOSCOPY;  Service: Endoscopy;  Laterality: N/A;   ESOPHAGOGASTRODUODENOSCOPY (EGD) WITH PROPOFOL N/A 03/25/2019   Procedure: ESOPHAGOGASTRODUODENOSCOPY (EGD) WITH PROPOFOL;  Surgeon: Lollie Sails, MD;  Location[facility] ENDOSCOPY;  Service: Endoscopy;  Laterality: N/A;   ESOPHAGOGASTRODUODENOSCOPY (EGD) WITH PROPOFOL N/A 11/29/2020   Procedure: ESOPHAGOGASTRODUODENOSCOPY (EGD) WITH PROPOFOL;  Surgeon: Haig Prophet,  Hilton Cork, MD;  Location: [facility] ENDOSCOPY;  Service: Endoscopy;  Laterality: N/A;   FLEXIBLE BRONCHOSCOPY N/A 10/10/2016   Procedure: FLEXIBLE BRONCHOSCOPY;  Surgeon: [name], MD;  Location: [facility] ORS;  Service: Pulmonary;  Laterality: N/A;   FLEXIBLE BRONCHOSCOPY N/A 12/11/2017   Procedure: FLEXIBLE BRONCHOSCOPY;  Surgeon: [name], MD;  Location: [facility] ORS;  Service: Pulmonary;  Laterality: N/A;   JOINT REPLACEMENT     KNEE ARTHROSCOPY  1985, 1990, 2012 x 2   bilateral   LUMBAR LAMINECTOMY  [id number]   NASAL SINUS SURGERY  1994   TONSILLECTOMY     TOTAL KNEE ARTHROPLASTY  12/22/2011   Procedure: TOTAL KNEE ARTHROPLASTY;   Surgeon: Rudean Haskell, MD;  Location: [place];  Service: Orthopedics;  Laterality: Left;    Allergies: Allergies as of 08/08/2022 - Review Complete 07/01/2022  Allergen Reaction Noted   Contrast media [iodinated contrast media] Anaphylaxis 12/05/2011   Gadolinium derivatives Anaphylaxis 04/06/2014   Gadoversetamide Anaphylaxis 04/06/2014   Shellfish allergy Nausea And Vomiting and Other (See Comments) 05/11/2015   Sulfa antibiotics Hives and Other (See Comments)    Hydroxychloroquine sulfate Hives and Other (See Comments) 07/04/2010   Banana Nausea And Vomiting and Other (See Comments) 05/11/2015   Limonene Other (See Comments) 12/08/2017   Morphine and related  03/24/2019   Nabumetone Other (See Comments)    Otezla [apremilast]  03/24/2019   Potassium-containing compounds Other (See Comments) 12/08/2017   Prednisone Nausea And Vomiting 12/15/2017   Sulfasalazine Hives and Other (See Comments) 07/04/2010   Sulfonamide derivatives Hives 07/04/2010   Metronidazole Rash 07/04/2010   Sulfamethoxazole Rash 03/24/2019    Medications: Outpatient Encounter Medications as of 08/08/2022  Medication Sig   ALPRAZolam (XANAX) 0.5 MG tablet Take 0.5 mg by mouth at bedtime.   aspirin 81 MG chewable tablet Chew 81 mg by mouth daily.   bismuth subsalicylate (PEPTO BISMOL) 262 MG/15ML suspension Take 30 mLs by mouth every 4 (four) hours as needed for diarrhea or loose stools.   budesonide (ENTOCORT EC) 3 MG 24 hr capsule Take 9 mg by mouth daily.   busPIRone (BUSPAR) 10 MG tablet Take 1 tablet (10 mg total) by mouth 2 (two) times daily.   Carboxymethylcellulose Sod PF 0.5 % SOLN Apply 1 drop to eye 2 (two) times daily. Both eyes   cetirizine (ZYRTEC) 10 MG tablet TAKE 1 TABLET(10 MG) BY MOUTH AT BEDTIME   dextromethorphan-guaiFENesin (MUCINEX DM) 30-600 MG 12hr tablet Take 1 tablet by mouth 2 (two) times daily.   doxycycline (VIBRA-TABS) 100 MG tablet Take 100 mg by mouth 2 (two) times daily.    ipratropium-albuterol (DUONEB) 0.5-2.5 (3) MG/3ML SOLN Take 3 mLs by nebulization every 6 (six) hours as needed (For wheezing).   memantine (NAMENDA) 10 MG tablet Take 10 mg by mouth 2 (two) times daily.   metoprolol tartrate (LOPRESSOR) 25 MG tablet Take 25 mg by mouth 2 (two) times daily.   mirtazapine (REMERON) 30 MG tablet Take 30 mg by mouth at bedtime.   NURTEC 75 MG TBDP Take 75 mg by mouth daily as needed for migraine.   oxyCODONE-acetaminophen (PERCOCET/ROXICET) 5-325 MG tablet Take 1 tablet by mouth every 6 (six) hours as needed for severe pain.   polyethylene glycol (MIRALAX / GLYCOLAX) 17 g packet Take 17 g by mouth 2 (two) times daily.   rosuvastatin (CRESTOR) 40 MG tablet Take 40 mg by mouth daily.   sertraline (ZOLOFT) 100 MG tablet Take 1 tablet (100 mg total) by  mouth at bedtime.   No facility-administered encounter medications on file as of 08/08/2022.    Social History: Social History   Tobacco Use   Smoking status: Never   Smokeless tobacco: Never  Vaping Use   Vaping Use: Never used  Substance Use Topics   Alcohol use: Yes    Alcohol/week: 1.0 standard drink of alcohol    Types: 1 Glasses of wine per week    Comment: occasional wine - less than once a month   Drug use: Never    Family Medical History: Family History  Problem Relation Age of Onset   Hypertension Mother    Hyperlipidemia Mother    Diabetes Mother    Hypertension Father    Hyperlipidemia Father    Arrhythmia Father        A-Fib   Diabetes Paternal Uncle    Diabetes Paternal Grandfather    Breast cancer Maternal Aunt        great in late 84's   Anesthesia problems Neg Hx    Colon cancer Neg Hx    Stomach cancer Neg Hx    Rectal cancer Neg Hx    Liver cancer Neg Hx    Esophageal cancer Neg Hx     Physical Examination: There were no vitals filed for this visit.  General: Patient is well developed, well nourished, calm, collected, and in no apparent distress. Attention to examination  is appropriate.  Respiratory: Patient is breathing without any difficulty.   NEUROLOGICAL:     Awake, alert, oriented to person, place, and time.  Speech is clear and fluent. Fund of knowledge is appropriate.   Cranial Nerves: Pupils equal round and reactive to light.  Facial tone is symmetric.  Facial sensation is symmetric.  Limited ROM of lumbar spine with pain.   She has tenderness over mid lumbar region.   No abnormal lesions on exposed skin.   Strength: Side Biceps Triceps Deltoid Interossei Grip Wrist Ext. Wrist Flex.  R _0 L _1 Side Iliopsoas Quads Hamstring PF DF EHL  R _2 L _3 Reflexes are 2+ and symmetric at the biceps, triceps, brachioradialis, patella and achilles.   Hoffman's is absent.  Clonus is not present.   Bilateral upper and lower extremity sensation is intact to light touch.     She ambulates slowly with a walker.   Medical Decision Making  Imaging: CT of lumbar spine dated 06/18/22:  FINDINGS: Segmentation: 5 lumbar type vertebrae.   Alignment: Mild lumbar levoscoliosis.  No listhesis.   Vertebrae: Acute L3 superior endplate compression fracture with 20% vertebral body height loss. No retropulsion or posterior element fracture. No suspicious osseous lesion.   Paraspinal and other soft tissues: Abdominal aortic atherosclerosis. Colonic diverticulosis.   Disc levels: Similar appearance of lumbar disc and facet degeneration compared to the prior MRI. Mild spinal stenosis at L3-4 and L4-5. Mild neural foraminal stenosis at L4-5 and L5-S1. Prior right laminectomies at L4-5 and L5-S1.   IMPRESSION: 1. Acute L3 compression fracture with 20% height loss. 2. Lumbar disc and facet degeneration, similar to the 09/24/2021 MRI. 3. Aortic Atherosclerosis (ICD10-I70.0).     Electronically Signed   By: Logan Bores M.D.   On: 06/18/2022 16:33    I have personally reviewed the images and agree with the  above interpretation.  Assessment and Plan: Ms.  Ruland is a pleasant 76 y.o. female with history of chronic back and leg pain. Lumbar laminectomy x 2 years ago.   She had fall on 06/17/12 and has constant LBP and intermittent bilateral leg pain posterior to her feet (can be right or left leg, usually not both). Does not feel like pain is improving.   CT from ED on 06/18/22 showed L3 compression fracture. Also with underlying lumbar spondylosis and stenosis.   Treatment options discussed with patient and following plan made:   - MRI of lumbar spine to further evaluate compression fracture at L3. No improvement in last 6 weeks.  - Discussed kyphoplasty procedure and she is interested in this.  - Increase percocet to tid prn severe pain. New prescription given. Reviewed dosing and side effects. PMP reviewed and is appropriate.  - Will plan phone review of MRI results and probable referral to IR for possible kyphoplasty.   I spent a total of 45 minutes in face-to-face and non-face-to-face activities related to this patient's care toda including review of outside records, review of imaging, review of symptoms, physical exam, discussion of differential diagnosis, discussion of treatment options, and documentation.   Thank you for involving me in the care of this patient.   Geronimo Boot PA-C Dept. of Neurosurgery

## 2022-08-08 ENCOUNTER — Ambulatory Visit: Payer: Medicare PPO | Admitting: Orthopedic Surgery

## 2022-08-08 ENCOUNTER — Encounter: Payer: Self-pay | Admitting: Orthopedic Surgery

## 2022-08-08 VITALS — BP 130/82 | Ht 69.0 in | Wt 264.0 lb

## 2022-08-08 DIAGNOSIS — W19XXXA Unspecified fall, initial encounter: Secondary | ICD-10-CM

## 2022-08-08 DIAGNOSIS — S32030A Wedge compression fracture of third lumbar vertebra, initial encounter for closed fracture: Secondary | ICD-10-CM | POA: Diagnosis not present

## 2022-08-08 MED ORDER — OXYCODONE HCL 5 MG PO TABS: 5.0000 mg | ORAL_TABLET | Freq: Three times a day (TID) | ORAL | 0 refills | Status: AC | PRN

## 2022-08-08 NOTE — Patient Instructions (Addendum)
It was so nice to see you today, I am sorry that you are hurting so much.   You have a compression fracture (broken bone) at L3 and this is likely causing your pain. I want to get an MRI of your lower back to look into this further. They will call you to schedule this at [facility] once it is approved.   I also sent a refill of oxycodone to help with severe pain. Use only as needed and be careful, this can make you sleepy and constipated.   Continue to wear the back brace when you are up and walking. No bending, twisting, or lifting.   Will set up phone visit to review MRI once it is done.   Please do not hesitate to call if you have any questions or concerns. You can also message me in [place].   If you have not heard back about any of the tests/procedures in the next week, please call the office so we can help you get these things scheduled.   Geronimo Boot PA-C [phone]

## 2022-08-25 ENCOUNTER — Ambulatory Visit
Admission: RE | Admit: 2022-08-25 | Discharge: 2022-08-25 | Disposition: A | Discharge status: Home / Self Care | Payer: Medicare PPO | Source: Ambulatory Visit | Attending: Orthopedic Surgery | Admitting: Orthopedic Surgery

## 2022-08-25 DIAGNOSIS — S32030A Wedge compression fracture of third lumbar vertebra, initial encounter for closed fracture: Secondary | ICD-10-CM | POA: Diagnosis present

## 2022-09-01 ENCOUNTER — Encounter: Payer: Self-pay | Admitting: Orthopedic Surgery

## 2022-09-01 ENCOUNTER — Ambulatory Visit (INDEPENDENT_AMBULATORY_CARE_PROVIDER_SITE_OTHER): Payer: Medicare PPO | Admitting: Orthopedic Surgery

## 2022-09-01 DIAGNOSIS — M48061 Spinal stenosis, lumbar region without neurogenic claudication: Secondary | ICD-10-CM | POA: Diagnosis not present

## 2022-09-01 DIAGNOSIS — S32030D Wedge compression fracture of third lumbar vertebra, subsequent encounter for fracture with routine healing: Secondary | ICD-10-CM | POA: Diagnosis not present

## 2022-09-01 MED ORDER — OXYCODONE HCL 5 MG PO TABS: 5.0000 mg | ORAL_TABLET | Freq: Three times a day (TID) | ORAL | 0 refills | Status: AC | PRN

## 2022-09-01 NOTE — Progress Notes (Signed)
Telephone Visit- Progress Note: Referring Physician:  No referring provider defined for this encounter.  Primary Physician:  Ria Clock, MD  This visit was performed via telephone.  Patient location: home Provider location: office  I spent a total of 20 minutes non-face-to-face activities for this visit on the date of this encounter including review of current clinical condition and response to treatment.   Chief Complaint:  review lumbar MRI   History of Present Illness: Caitlin Leonard is a 76 y.o. female who was last seen by me on 08/08/22 for constant LBP and intermittent bilateral leg pain posterior to her feet (can be right or left leg, usually not both) that started after fall on 06/17/12. Does not feel like pain is improving.   Telephone visit scheduled to review her lumbar MRI. She was to increase percocet to tid prn at her last visit as well.   Primary complaint continues to be severe LBP that is not improving. This pain started after her fall and is different from her chronic back/leg pain. She has intermittent leg pain posterior to her feet. She ambulates with a rollator. She is taking prn percocet and needs a refill.    Conservative measures:  Physical therapy: she is in HHPT  Multimodal medical therapy including regular antiinflammatories: robaxin, percocet  Injections: Bilateral L5-S1 TF ESI 10/10/21 [facility])- was lost to follow up. She states it helped with her shoulder pain but not her leg pain.    Past Surgery: lumbar surgery in 1974 and 1986.   Exam: No exam done as this was a telephone encounter.     Imaging: Lumbar MRI dated 08/25/22:  FINDINGS: Segmentation:  Standard.   Alignment:  Physiologic.   Vertebrae: Subacute L3 vertebral body compression fracture with approximately 20% height loss and marrow edema along the superior endplate. No other fracture. No discitis or osteomyelitis. No aggressive osseous lesion.   Conus medullaris and cauda  equina: Conus extends to the T12-L1 level. Conus and cauda equina appear normal.   Paraspinal and other soft tissues: No acute paraspinal abnormality.   Disc levels:   Disc spaces: Degenerative disease with disc height loss at L4-5. Disc desiccation at L2-3, L3-4.   T12-L1: No significant disc bulge. No neural foraminal stenosis. No central canal stenosis. Mild bilateral facet arthropathy.   L1-L2: No significant disc bulge. No neural foraminal stenosis. No central canal stenosis. Mild bilateral facet arthropathy.   L2-L3: Mild broad-based disc bulge. Severe bilateral facet arthropathy with ligamentum flavum infolding. Moderate-severe spinal stenosis. Mild bilateral foraminal stenosis.   L3-L4: Mild broad-based disc bulge. Severe bilateral facet arthropathy. Moderate spinal stenosis. No foraminal stenosis.   L4-L5: Broad-based disc bulge with a central disc extrusion. Mild bilateral facet arthropathy. Right subarticular recess stenosis. No spinal stenosis.   L5-S1: No disc protrusion. Moderate left foraminal stenosis. No right foraminal stenosis. Severe bilateral facet arthropathy.   IMPRESSION: 1. Stable subacute L3 vertebral body compression fracture with approximately 20% height loss and marrow edema along the superior endplate. 2. At L2-3 there is a mild broad-based disc bulge. Severe bilateral facet arthropathy with ligamentum flavum infolding. Moderate-severe spinal stenosis. Mild bilateral foraminal stenosis. 3. At L3-4 there is a mild broad-based disc bulge. Severe bilateral facet arthropathy. Moderate spinal stenosis. 4. At L4-5 there is a broad-based disc bulge with a central disc extrusion. Mild bilateral facet arthropathy. Right subarticular recess stenosis. 5. At L5-S1 there is moderate left foraminal stenosis. No right foraminal stenosis. Severe bilateral facet arthropathy.  Electronically Signed   By: Kathreen Devoid M.D.   On: 08/26/2022 08:59  I have  personally reviewed the images and agree with the above interpretation.  Assessment and Plan: [name] is a pleasant 76 y.o. female with primary complaint of severe LBP that is not improving. This pain started after her fall on 06/17/22 and is different from her chronic back/leg pain. She also has intermittent leg pain posterior to her feet- does not have this pain daily.   MRI shows stable subacute L3 vertebral body compression fracture with approximately 20% height loss and marrow edema along the superior endplate. This is likely cause of her severe LBP.   She also has known diffuse lumbar spondylosis with moderate/severe central stenosis L2-L3, moderate central stenosis L3-L4, and severe DDD L4-L5. Leg pain is likely multifactorial.   History of lumbar surgery in 1974 and 1986.   Treatment options discussed with patient and following plan made:   - Reviewed lumbar MRI results with patient and discussed referral to IR for kyphoplasty. She would like to proceed with this.  - We discussed that hopefully kyphoplasty will help with severe LBP she's had since fall, but I did not think it would help with her chronic back/leg symptoms.  - Okay to continue on prn percocet. Reviewed dosing and side effects. She takes xanax at night and is aware to not take this and percocet together- should be separated by 6-8 hours. PMP reviewed and is appropriate. Refill given.   Geronimo Boot PA-C Neurosurgery

## 2022-09-03 ENCOUNTER — Other Ambulatory Visit: Payer: Self-pay | Admitting: Orthopedic Surgery

## 2022-09-03 DIAGNOSIS — S32030D Wedge compression fracture of third lumbar vertebra, subsequent encounter for fracture with routine healing: Secondary | ICD-10-CM

## 2022-09-04 ENCOUNTER — Ambulatory Visit
Admission: RE | Admit: 2022-09-04 | Discharge: 2022-09-04 | Disposition: A | Discharge status: Home / Self Care | Payer: Medicare PPO | Source: Ambulatory Visit | Attending: Orthopedic Surgery | Admitting: Orthopedic Surgery

## 2022-09-04 DIAGNOSIS — S32030D Wedge compression fracture of third lumbar vertebra, subsequent encounter for fracture with routine healing: Secondary | ICD-10-CM

## 2022-09-04 HISTORY — PX: IR RADIOLOGIST EVAL & MGMT: IMG5224

## 2022-09-04 NOTE — Consult Note (Addendum)
Chief Complaint: Patient was seen in consultation today for lumbar spine fracture  Referring Physician(s): Luna,Stacy  History of Present Illness: Caitlin Leonard is a 76 y.o. female with history of fall  approximately 2 months ago and persistent low back pain.  MRI lumbar spine was performed on 08/25/22 which demonstrated acute/subacute L3 compression fracture with approximately 20% height loss and no retropulsion.  She describes her pain and constant and severe, 9-10/10.  She has been taking oxycodone 2-3x per day, decreased from a few weeks ago, which only improves her pain to an 8/10.  Her pain has her wheelchair bound and affects most activities of daily living, with a Nogal score of 18/24.  During our consult, she constantly shifts in her wheelchair due to severe pain.  She was seen by Neurosurgery on 11/27 and referred graciously for consideration of kyphoplasty.  She has history of COPD and chronic shortness of breath, on 1 L home O2 as needed if shortness of breath worsens.  She usually feels better after short sessions of oxygen supplementation.    Past Medical History:  Diagnosis Date   Anemia    Anxiety    Arthritis    osteoarthritis   Asthma    due to seasonal alllergies   Atrial fibrillation (HCC)    Carpal tunnel syndrome    Cataract    Cervicalgia    Collagenous colitis 2010   COPD (chronic obstructive pulmonary disease) (HCC)    Depression    situational   Diabetes mellitus without complication (HCC)    Dysrhythmia    GERD (gastroesophageal reflux disease)    GI bleed    Headache(784.0)    migraines; 2 x month   Heart murmur    MVP ( symptomatic)   History of IBS    Hyperlipemia    Hypertension    on medication x 10 years   Migraine    MVP (mitral valve prolapse)    Shortness of breath    exertional   Sinoatrial node dysfunction ([place])    Sleep apnea 2007   does not use CPAP since 40 # wt loss   Stroke [facility])    Syncopal episodes      Past Surgical History:  Procedure Laterality Date   ABDOMINAL HYSTERECTOMY  1979   BACK SURGERY     BREAST CYST ASPIRATION Right 25 plus yrs ago   CARDIAC CATHETERIZATION  2012   [place]  2011   COLONOSCOPY WITH PROPOFOL N/A 03/25/2019   Procedure: COLONOSCOPY WITH PROPOFOL;  Surgeon: Lollie Sails, MD;  Location: [facility] ENDOSCOPY;  Service: Endoscopy;  Laterality: N/A;   COLONOSCOPY WITH PROPOFOL N/A 11/29/2020   Procedure: COLONOSCOPY WITH PROPOFOL;  Surgeon: Lesly Rubenstein, MD;  Location: [facility] ENDOSCOPY;  Service: Endoscopy;  Laterality: N/A;   ESOPHAGOGASTRODUODENOSCOPY (EGD) WITH PROPOFOL N/A 03/25/2019   Procedure: ESOPHAGOGASTRODUODENOSCOPY (EGD) WITH PROPOFOL;  Surgeon: Lollie Sails, MD;  Location: [facility] ENDOSCOPY;  Service: Endoscopy;  Laterality: N/A;   ESOPHAGOGASTRODUODENOSCOPY (EGD) WITH PROPOFOL N/A 11/29/2020   Procedure: ESOPHAGOGASTRODUODENOSCOPY (EGD) WITH PROPOFOL;  Surgeon: Lesly Rubenstein, MD;  Location: [facility] ENDOSCOPY;  Service: Endoscopy;  Laterality: N/A;   FLEXIBLE BRONCHOSCOPY N/A 10/10/2016   Procedure: FLEXIBLE BRONCHOSCOPY;  Surgeon: [name], MD;  Location: [facility] ORS;  Service: Pulmonary;  Laterality: N/A;   FLEXIBLE BRONCHOSCOPY N/A 12/11/2017   Procedure: FLEXIBLE BRONCHOSCOPY;  Surgeon: [name], MD;  Location: [facility] ORS;  Service: Pulmonary;  Laterality: N/A;   JOINT  REPLACEMENT     KNEE ARTHROSCOPY  1985, 1990, 2012 x 2   bilateral   LUMBAR LAMINECTOMY  [id number],9242   NASAL SINUS SURGERY  1994   TONSILLECTOMY     TOTAL KNEE ARTHROPLASTY  12/22/2011   Procedure: TOTAL KNEE ARTHROPLASTY;  Surgeon: Rudean Haskell, MD;  Location: [place];  Service: Orthopedics;  Laterality: Left;    Allergies: Contrast media [iodinated contrast media], Gadolinium derivatives, Gadoversetamide, Shellfish allergy, Sulfa antibiotics, Hydroxychloroquine sulfate, Banana, Limonene, Morphine and related, Nabumetone, Otezla [apremilast],  Potassium-containing compounds, Prednisone, Sulfasalazine, Sulfonamide derivatives, Metronidazole, and Sulfamethoxazole  Medications: Prior to Admission medications   Medication Sig Start Date End Date Taking? Authorizing Provider  ALPRAZolam Duanne Moron) 0.5 MG tablet Take 0.5 mg by mouth at bedtime.   Yes [provider]  aspirin 81 MG chewable tablet Chew 81 mg by mouth daily.   Yes [provider]  budesonide (ENTOCORT EC) 3 MG 24 hr capsule Take 9 mg by mouth daily. 03/29/20  Yes [provider]  busPIRone (BUSPAR) 10 MG tablet Take 1 tablet (10 mg total) by mouth 2 (two) times daily. Patient taking differently: Take 10 mg by mouth 3 (three) times daily. 11/19/21  Yes Lorella Nimrod, MD  cetirizine (ZYRTEC) 10 MG tablet TAKE 1 TABLET(10 MG) BY MOUTH AT BEDTIME 01/20/20  Yes Tyler Pita, MD  memantine (NAMENDA) 10 MG tablet Take 10 mg by mouth 2 (two) times daily.   Yes [provider]  metoprolol tartrate (LOPRESSOR) 25 MG tablet Take 25 mg by mouth 2 (two) times daily.   Yes [provider]  mirtazapine (REMERON) 30 MG tablet Take 30 mg by mouth at bedtime.   Yes [provider]  NURTEC 75 MG TBDP Take 75 mg by mouth daily as needed for migraine. 11/16/21  Yes [provider]  oxyCODONE (ROXICODONE) 5 MG immediate release tablet Take 1 tablet (5 mg total) by mouth every 8 (eight) hours as needed for severe pain. 09/01/22  Yes Geronimo Boot, PA-C  rosuvastatin (CRESTOR) 40 MG tablet Take 40 mg by mouth daily.   Yes [provider]  sertraline (ZOLOFT) 100 MG tablet Take 1 tablet (100 mg total) by mouth at bedtime. 06/25/22  Yes Beamer, Jordan Hawks, MD  doxycycline (VIBRA-TABS) 100 MG tablet Take 100 mg by mouth 2 (two) times daily.    [provider]  polyethylene glycol (MIRALAX / GLYCOLAX) 17 g packet Take 17 g by mouth 2 (two) times daily. 03/28/22   Nolberto Hanlon, MD     Family History  Problem Relation Age of Onset    Hypertension Mother    Hyperlipidemia Mother    Diabetes Mother    Hypertension Father    Hyperlipidemia Father    Arrhythmia Father        A-Fib   Diabetes Paternal Uncle    Diabetes Paternal Grandfather    Breast cancer Maternal Aunt        great in late 25's   Anesthesia problems Neg Hx    Colon cancer Neg Hx    Stomach cancer Neg Hx    Rectal cancer Neg Hx    Liver cancer Neg Hx    Esophageal cancer Neg Hx     Social History   Socioeconomic History   Marital status: Married    Spouse name: Not on file   Number of children: 2   Years of education: Not on file   Highest education level: Not on file  Occupational History  Occupation: retired Pharmacist, hospital  Tobacco Use   Smoking status: Never   Smokeless tobacco: Never  Vaping Use   Vaping Use: Never used  Substance and Sexual Activity   Alcohol use: Yes    Alcohol/week: 1.0 standard drink of alcohol    Types: 1 Glasses of wine per week    Comment: occasional wine - less than once a month   Drug use: Never   Sexual activity: Not Currently  Other Topics Concern   Not on file  Social History Narrative   Lives with husband in [place]. She is retired Education officer, museum. Attends church regular and has strong emotional, and spiritual support form family, and friends. Likes to read, crossword puzzles and games.   Social Determinants of Health   Financial Resource Strain: Not on file  Food Insecurity: Not on file  Transportation Needs: Not on file  Physical Activity: Not on file  Stress: Not on file  Social Connections: Not on file    Review of Systems: A 12 point ROS discussed and pertinent positives are indicated in the HPI above.  All other systems are negative.  Vital Signs: There were no vitals taken for this visit.  Physical Exam HENT:     Head: Normocephalic.     Mouth/Throat:     Mouth: Mucous membranes are moist.  Cardiovascular:     Rate and Rhythm: Normal rate and regular rhythm.     Heart sounds:  Normal heart sounds.  Pulmonary:     Effort: No respiratory distress.     Breath sounds: No wheezing.     Comments: Mild tachypnea. Abdominal:     General: There is no distension.  Musculoskeletal:     Comments: Tender to palpation in midline lower back in region of mid lumbar spine.  Skin:    General: Skin is warm and dry.  Neurological:     Mental Status: She is alert and oriented to person, place, and time.     Imaging: MR Lumbar Spine 08/25/22   Labs:  CBC: Recent Labs    11/19/21 0553 03/26/22 1508 03/27/22 0939 04/10/22 0000  WBC 4.8 5.9 7.0 4.0  4.0  HGB 12.8 13.1 12.4 11.7*  11.7*  HCT 39.9 41.4 38.7 35*  35*  PLT 130* 78* 150 149*  149*    COAGS: Recent Labs    11/18/21 2338  INR 1.1  APTT >200*    BMP: Recent Labs    11/18/21 1232 11/19/21 0553 03/26/22 1508 04/03/22 0000 04/10/22 0000  NA 137 140  --  128* 131*  131*  K 3.7 3.1*  --  4.4 4.1  4.1  CL 102 107  --  94* 97*  97*  CO2 26 24  --  29* 31*  31*  GLUCOSE 189* 134*  --   --   --   BUN 19 15  --  _0 CALCIUM 10.0 9.3  --  9.5 9.5  9.5  CREATININE 0.81 0.47 0.63 0.8 0.6  0.6  GFRNONAA >60 >60 >60  --   --     LIVER FUNCTION TESTS: Recent Labs    11/18/21 1232  BILITOT 1.1  AST 23  ALT 12  ALKPHOS 67  PROT 7.1  ALBUMIN 3.9    TUMOR MARKERS: No results for input(s): "AFPTM", "CEA", "CA199", "CHROMGRNA" in the last 8760 hours.  Assessment & Plan:   Patient has suffered subacute osteoporotic/ traumatic fracture of the L3 vertebra.   History and exam  have demonstrated the following:  Acute/Subacute fracture by imaging dated 08/25/22, Pain on exam concordant with level of fracture, Failure of conservative therapy and pain refractory to narcotic pain mediation, and Significant disability on the [place] with 18/24 positive symptoms, reflecting significant impact/impairment of (ADLs)   ICD-10-CM Codes that Support Medical  Necessity ([id number])  M80.08XA    Age-related osteoporosis with current pathological fracture, vertebra(e), initial encounter for fracture and S32.030A    Wedge compression fracture of third lumbar vertebra, initial encounter for closed fracture    Plan:  L3 vertebral body augmentation with balloon kyphoplasty  Post-procedure disposition: outpatient, [facility]  Medication holds: ASA 81 mg for 3-5 days.  I have asked her to start holding this now in case she is able to be scheduled by early next week. The patient has suffered a fracture of the L3 vertebral body. It is recommended that patients aged 92 years or older be evaluated for possible testing or treatment of osteoporosis. A copy of this consult report is sent to the patient's referring physician.  Advanced Care Plan: The patient did not want to provide an [place] at the time of this visit     Total time spent on today's visit was over  40 Minutes, including both face-to-face time and non face-to-face time, personally spent on review of chart (including labs and relevant imaging), discussing further workup and treatment options, referral to specialist if needed, reviewing outside records if pertinent, answering patient questions, and coordinating care regarding lumbar compression fracture as well as management strategy.    Electronically Signed: Suzette Battiest, MD 09/04/2022, 4:13 PM

## 2022-09-10 ENCOUNTER — Telehealth: Payer: Self-pay

## 2022-09-10 MED ORDER — DIPHENHYDRAMINE HCL 50 MG PO TABS: ORAL_TABLET | ORAL | 0 refills | Status: DC

## 2022-09-10 MED ORDER — PREDNISONE 50 MG PO TABS: ORAL_TABLET | ORAL | 0 refills | Status: DC

## 2022-09-10 NOTE — Telephone Encounter (Signed)
Phone call to patient to review instructions for 13 hr prep for CT contrast in Kyphoplasty balloon on 09/11/22 at 1000. Prescription called into [place]. Pt aware and verbalized understanding of instructions. Prescription: Prednisone and '50mg'$  BenadrylPt to take 50 mg of prednisone on 09/10/22 at 9:00 pm, 50 mg of prednisone on 09/11/22 @ 3:00 am, and 50 mg of prednisone on 09/11/22 @ 9:00 am. Pt is also to take 50 mg of benadryl on 09/11/22 @ 9:00 am. Please call [phone] with any questions.

## 2022-09-10 NOTE — Discharge Instructions (Signed)
Kyphoplasty Post Procedure Discharge Instructions  May resume a regular diet and any medications that you routinely take (including pain medications). However, if you are taking Aspirin or an anticoagulant/blood thinner you will be told when you can resume taking these by the healthcare provider. No driving day of procedure. The day of your procedure take it easy. You may use an ice pack as needed to injection sites on back.  Ice to back 30 minutes on and 30 minutes off, as needed. May remove bandaids tomorrow after taking a shower. Replace daily with a clean bandaid until healed.  Do not lift anything heavier than a milk jug for 1-2 weeks or determined by your physician.  Follow up with your physician in 2 weeks.    Please contact our office at [phone] for the following symptoms or if you have any questions:  Fever greater than 100 degrees Increased swelling, pain, or redness at injection site. Increased back and/or leg pain New numbness or change in symptoms from before the procedure.    Thank you for visiting [facility].  May resume aspirin immediately after procedure. 

## 2022-09-11 ENCOUNTER — Telehealth: Payer: Self-pay

## 2022-09-11 ENCOUNTER — Ambulatory Visit
Admission: RE | Admit: 2022-09-11 | Discharge: 2022-09-11 | Disposition: A | Discharge status: Home / Self Care | Payer: Medicare PPO | Source: Ambulatory Visit | Attending: Orthopedic Surgery | Admitting: Orthopedic Surgery

## 2022-09-11 DIAGNOSIS — S32030D Wedge compression fracture of third lumbar vertebra, subsequent encounter for fracture with routine healing: Secondary | ICD-10-CM

## 2022-09-11 HISTORY — PX: IR KYPHO LUMBAR INC FX REDUCE BONE BX UNI/BIL CANNULATION INC/IMAGING: IMG5519

## 2022-09-11 MED ORDER — MIDAZOLAM HCL 2 MG/2ML IJ SOLN
1.0000 mg | INTRAMUSCULAR | Status: DC | PRN
  Administered 2022-09-11 (×3): 1 mg via INTRAVENOUS

## 2022-09-11 MED ORDER — ACETAMINOPHEN 10 MG/ML IV SOLN
1000.0000 mg | Freq: Once | INTRAVENOUS | Status: AC
  Administered 2022-09-11: 1000 mg via INTRAVENOUS

## 2022-09-11 MED ORDER — SODIUM CHLORIDE 0.9 % IV SOLN: INTRAVENOUS | Status: DC

## 2022-09-11 MED ORDER — FENTANYL CITRATE PF 50 MCG/ML IJ SOSY
25.0000 ug | PREFILLED_SYRINGE | INTRAMUSCULAR | Status: DC | PRN
  Administered 2022-09-11 (×2): 50 ug via INTRAVENOUS

## 2022-09-11 MED ORDER — CEFAZOLIN IN SODIUM CHLORIDE 3-0.9 GM/100ML-% IV SOLN
3.0000 g | INTRAVENOUS | Status: AC
  Administered 2022-09-11: 3 g via INTRAVENOUS

## 2022-09-11 NOTE — Telephone Encounter (Signed)
-----   Message from Peggyann Shoals sent at 09/10/2022 12:04 PM EST ----- Regarding: kypho Contact: [phone] Patient's husband Alvester Chou is calling and asking for Stacy's help in getting pt's kyphoplasty approved. Juliann Pulse from [facility] told patient that [facility] has declined approval for kypho.  They are requesting additional notes from [facility] of her bone density scan.  Is there anything that Marzetta Board can do to excalate the process. Can you call Juliann Pulse and see what the hold up maybe? Juliann Pulse is at [phone].

## 2022-09-11 NOTE — Telephone Encounter (Signed)
Thoughts? Do you want me to reach out to [facility]?

## 2022-09-11 NOTE — Telephone Encounter (Signed)
Kyphoplasty was done today so I'm assuming it was approved.

## 2022-09-11 NOTE — Discharge Instructions (Signed)
Kyphoplasty Post Procedure Discharge Instructions  May resume a regular diet and any medications that you routinely take (including pain medications). However, if you are taking Aspirin or an anticoagulant/blood thinner you will be told when you can resume taking these by the healthcare provider. No driving day of procedure. The day of your procedure take it easy. You may use an ice pack as needed to injection sites on back.  Ice to back 30 minutes on and 30 minutes off, as needed. May remove bandaids tomorrow after taking a shower. Replace daily with a clean bandaid until healed.  Do not lift anything heavier than a milk jug for 1-2 weeks or determined by your physician.  Follow up with your physician in 2 weeks.    Please contact our office at [phone] for the following symptoms or if you have any questions:  Fever greater than 100 degrees Increased swelling, pain, or redness at injection site. Increased back and/or leg pain New numbness or change in symptoms from before the procedure.    Thank you for visiting [facility].  May resume aspirin immediately after procedure. 

## 2022-09-11 NOTE — Progress Notes (Signed)
Pt back in nursing recovery area. Pt still drowsy from procedure but will wake up when spoken to. Pt follows commands, talks in complete sentences and has no complaints at this time. Pt will remain in nursing station until discharge.  ?

## 2022-10-14 ENCOUNTER — Other Ambulatory Visit: Payer: Self-pay | Admitting: Student

## 2022-10-14 DIAGNOSIS — H9121 Sudden idiopathic hearing loss, right ear: Secondary | ICD-10-CM

## 2022-10-21 ENCOUNTER — Other Ambulatory Visit: Payer: Self-pay | Admitting: Interventional Radiology

## 2022-10-21 DIAGNOSIS — S32030A Wedge compression fracture of third lumbar vertebra, initial encounter for closed fracture: Secondary | ICD-10-CM

## 2022-10-21 NOTE — Progress Notes (Signed)
Phone call to pt to follow up from her kyphoplasty on 09/11/22. Pt reports her pain is completely gone post procedure but is still having "a little soreness". Pt reports she is able to move around a little better. Pt denies any signs of infection, redness at the site, draining or fever. Pt has no complaints at this time and will be scheduled for a telephone follow up with Dr. Denna Haggard in a couple weeks. Pt advised to call back if anything were to change or any concerns arise and we will arrange an in person appointment. Pt verbalized understanding.

## 2022-10-28 ENCOUNTER — Other Ambulatory Visit: Payer: Self-pay | Admitting: Student

## 2022-10-28 DIAGNOSIS — H9121 Sudden idiopathic hearing loss, right ear: Secondary | ICD-10-CM

## 2022-11-04 ENCOUNTER — Ambulatory Visit
Admission: RE | Admit: 2022-11-04 | Discharge: 2022-11-04 | Disposition: A | Discharge status: Home / Self Care | Payer: Medicare PPO | Source: Ambulatory Visit | Attending: Interventional Radiology | Admitting: Interventional Radiology

## 2022-11-04 ENCOUNTER — Telehealth: Payer: Medicare PPO

## 2022-11-04 DIAGNOSIS — S32030A Wedge compression fracture of third lumbar vertebra, initial encounter for closed fracture: Secondary | ICD-10-CM

## 2022-11-04 HISTORY — PX: IR RADIOLOGIST EVAL & MGMT: IMG5224

## 2022-11-04 NOTE — Progress Notes (Signed)
IR progress note   I spoke with patient today for routine follow-up after L3 kyphoplasty on September 11, 2022.  The patient reports markedly improved pain in her lower back, and she is satisfied with the pain relief that the procedure has provided.  She reports that the pain relief occurred approximately 2 days after the procedure.  She is no longer taking any prescription pain medication which he was taking before the procedure.    The patient may continue to follow-up with me as needed.    Albin Felling, MD  Vascular and Interventional Radiology 11/04/2022 4:35 PM

## 2023-01-29 ENCOUNTER — Encounter: Payer: Self-pay | Admitting: Psychiatry

## 2023-11-11 ENCOUNTER — Telehealth: Payer: Self-pay

## 2023-11-11 NOTE — Telephone Encounter (Signed)
Patient states she has had a problem with her oxygen concentrator and portable oxygen. Patient has gotten a bill for 4,000 for oxygen equipment with [facility] [employer]. Patient is making an appointment   Fax number for [employer] is [phone]

## 2023-11-12 NOTE — Telephone Encounter (Signed)
Will follow up at visit.

## 2023-11-12 NOTE — Telephone Encounter (Signed)
 The patient called with a nurse and I informed them that you were not her PCP. They stated that her PCP retired and that you saw the patient in the hospital before, and she lives at [facility] .

## 2023-11-19 ENCOUNTER — Ambulatory Visit: Payer: Medicare PPO | Admitting: Nurse Practitioner

## 2023-11-19 ENCOUNTER — Encounter: Payer: Self-pay | Admitting: Nurse Practitioner

## 2023-11-19 VITALS — BP 136/82 | HR 71 | Temp 97.3°F | Ht 69.0 in | Wt 237.0 lb

## 2023-11-19 DIAGNOSIS — J410 Simple chronic bronchitis: Secondary | ICD-10-CM | POA: Diagnosis not present

## 2023-11-19 NOTE — Progress Notes (Signed)
Careteam: Patient Care Team: Tawnya Crook, MD as PCP - General (Internal Medicine) Antonieta Iba, MD as Consulting Physician (Cardiology) PLACE OF SERVICE:  [facility]   Advanced Directive information    Allergies  Allergen Reactions   Contrast Media [Iodinated Contrast Media] Anaphylaxis   Gadolinium Derivatives Anaphylaxis   Gadoversetamide Anaphylaxis   Shellfish Allergy Nausea And Vomiting and Other (See Comments)    Can eat shrimp but not oysters Dizziness,severe nausea and vomiting ( can eat shrimp CAN NOT EAT ORYSTERS) Other reaction(s): Other (See Comments) Dizziness,severe nausea and vomiting ( can eat shrimp CAN NOT EAT ORYSTERS)  Can eat shrimp but not oysters Dizziness,severe nausea and vomiting ( can eat shrimp CAN NOT EAT ORYSTERS)  Can eat shrimp but not oysters    Sulfa Antibiotics Hives and Other (See Comments)    GI upset GI upset GI upset  GI upset GI upset GI upset Other reaction(s): Other (See Comments) GI upset GI upset  GI upset GI upset    Hydroxychloroquine Sulfate Hives and Other (See Comments)    Severe hives, all over like chicken pox.   Banana Nausea And Vomiting and Other (See Comments)    Raw bananas   Limonene Other (See Comments)    GI upset GI upset    Morphine And Codeine    Nabumetone Other (See Comments)    Hypertension   Otezla [Apremilast]    Potassium-Containing Compounds Other (See Comments)    GI upset   Prednisone Nausea And Vomiting   Sulfasalazine Hives and Other (See Comments)    GI upset   Sulfonamide Derivatives Hives   Metronidazole Rash   Sulfamethoxazole Rash    Chief Complaint  Patient presents with   Oxygen Concentrator Concerns    Patient has been on Oxygen since 2023 and has stopped using. Patient is wanting a Discontinue letter written to [employer] to Stop using so she doesn't get charged for it.      HPI: Patient is a 78 y.o. female seen in today  at [facility]   Discussed the use of AI scribe software for clinical note transcription with the patient, who gave verbal consent to proceed.  History of Present Illness   Caitlin Leonard is a 78 year old female who presents to discuss oxygen use. Her PCP is in [place] and she plans to continue to go there for her primary care needs.   She was discharged from [facility] acute rehab in September 2023 (she was seen by Dr Sydnee Cabal and myself during this stay) with oxygen therapy prescribed. She has used the oxygen occasionally since then but has not used it in the last month as she has gradually weaned herself off it. Her current oxygen saturation is 99%, and it stays at 98% most of the time.  She has a history of chronic shortness of breath and bronchitis. She has been seen by a pulmonologist intermittently over the last two years but has not needed to see one recently. She experiences shortness of breath during physical therapy, but it resolves quickly.  She is seeking to discontinue the oxygen as she no longer needs it and has been receiving monthly bills for it. She wants the oxygen tank to be collected as she is not using it anymore.      Review of Systems:  Review of Systems  Constitutional:  Negative for chills, fever and malaise/fatigue.  Respiratory:  Positive for shortness of breath (stable). Negative for  cough.     Past Medical History:  Diagnosis Date   Anemia    Anxiety    Arthritis    osteoarthritis   Asthma    due to seasonal alllergies   Atrial fibrillation (HCC)    Carpal tunnel syndrome    Cataract    Cervicalgia    Collagenous colitis 2010   COPD (chronic obstructive pulmonary disease) (HCC)    Depression    situational   Diabetes mellitus without complication (HCC)    Dysrhythmia    GERD (gastroesophageal reflux disease)    GI bleed    Headache(784.0)    migraines; 2 x month   Heart murmur    MVP ( symptomatic)   History of IBS    Hyperlipemia    Hypertension    on  medication x 10 years   Migraine    MVP (mitral valve prolapse)    Shortness of breath    exertional   Sinoatrial node dysfunction (HCC)    Sleep apnea 2007   does not use CPAP since 40 # wt loss   Stroke [facility])    Syncopal episodes    Past Surgical History:  Procedure Laterality Date   ABDOMINAL HYSTERECTOMY  1979   BACK SURGERY     BREAST CYST ASPIRATION Right 25 plus yrs ago   CARDIAC CATHETERIZATION  2012   [facility]   CHOLECYSTECTOMY  2011   COLONOSCOPY WITH PROPOFOL N/A 03/25/2019   Procedure: COLONOSCOPY WITH PROPOFOL;  Surgeon: Christena Deem, MD;  Location: [facility] ENDOSCOPY;  Service: Endoscopy;  Laterality: N/A;   COLONOSCOPY WITH PROPOFOL N/A 11/29/2020   Procedure: COLONOSCOPY WITH PROPOFOL;  Surgeon: Regis Bill, MD;  Location: [facility] ENDOSCOPY;  Service: Endoscopy;  Laterality: N/A;   ESOPHAGOGASTRODUODENOSCOPY (EGD) WITH PROPOFOL N/A 03/25/2019   Procedure: ESOPHAGOGASTRODUODENOSCOPY (EGD) WITH PROPOFOL;  Surgeon: Christena Deem, MD;  Location: [facility] ENDOSCOPY;  Service: Endoscopy;  Laterality: N/A;   ESOPHAGOGASTRODUODENOSCOPY (EGD) WITH PROPOFOL N/A 11/29/2020   Procedure: ESOPHAGOGASTRODUODENOSCOPY (EGD) WITH PROPOFOL;  Surgeon: Regis Bill, MD;  Location: [facility] ENDOSCOPY;  Service: Endoscopy;  Laterality: N/A;   FLEXIBLE BRONCHOSCOPY N/A 10/10/2016   Procedure: FLEXIBLE BRONCHOSCOPY;  Surgeon: Merwyn Katos, MD;  Location: [facility] ORS;  Service: Pulmonary;  Laterality: N/A;   FLEXIBLE BRONCHOSCOPY N/A 12/11/2017   Procedure: FLEXIBLE BRONCHOSCOPY;  Surgeon: Merwyn Katos, MD;  Location: [facility] ORS;  Service: Pulmonary;  Laterality: N/A;   IR KYPHO LUMBAR INC FX REDUCE BONE BX UNI/BIL CANNULATION INC/IMAGING  09/11/2022   IR RADIOLOGIST EVAL & MGMT  09/04/2022   IR RADIOLOGIST EVAL & MGMT  11/04/2022   JOINT REPLACEMENT     KNEE ARTHROSCOPY  1985, 1990, 2012 x 2   bilateral   LUMBAR LAMINECTOMY  1191,4782   NASAL SINUS SURGERY  1994   TONSILLECTOMY      TOTAL KNEE ARTHROPLASTY  12/22/2011   Procedure: TOTAL KNEE ARTHROPLASTY;  Surgeon: Raymon Mutton, MD;  Location: [facility] OR;  Service: Orthopedics;  Laterality: Left;   Social History:   reports that she has never smoked. She has never used smokeless tobacco. She reports current alcohol use of about 1.0 standard drink of alcohol per week. She reports that she does not use drugs.  Family History  Problem Relation Age of Onset   Hypertension Mother    Hyperlipidemia Mother    Diabetes Mother    Hypertension Father    Hyperlipidemia Father    Arrhythmia Father  A-Fib   Diabetes Paternal Uncle    Diabetes Paternal Grandfather    Breast cancer Maternal Aunt        great in late 70's   Anesthesia problems Neg Hx    Colon cancer Neg Hx    Stomach cancer Neg Hx    Rectal cancer Neg Hx    Liver cancer Neg Hx    Esophageal cancer Neg Hx     Medications: Patient's Medications  New Prescriptions   No medications on file  Previous Medications   ALPRAZOLAM (XANAX) 0.5 MG TABLET    Take 0.5 mg by mouth at bedtime.   ASPIRIN 81 MG CHEWABLE TABLET    Chew 81 mg by mouth daily.   BUDESONIDE (ENTOCORT EC) 3 MG 24 HR CAPSULE    Take 9 mg by mouth daily.   CETIRIZINE (ZYRTEC) 10 MG TABLET    TAKE 1 TABLET(10 MG) BY MOUTH AT BEDTIME   MIRTAZAPINE (REMERON) 30 MG TABLET    Take 30 mg by mouth at bedtime.   NURTEC 75 MG TBDP    Take 75 mg by mouth daily as needed for migraine.   OXYCODONE (ROXICODONE) 5 MG IMMEDIATE RELEASE TABLET    Take 1 tablet (5 mg total) by mouth every 8 (eight) hours as needed for severe pain.   ROSUVASTATIN (CRESTOR) 40 MG TABLET    Take 40 mg by mouth daily.   SERTRALINE (ZOLOFT) 100 MG TABLET    Take 1 tablet (100 mg total) by mouth at bedtime.  Modified Medications   No medications on file  Discontinued Medications   BUSPIRONE (BUSPAR) 10 MG TABLET    Take 1 tablet (10 mg total) by mouth 2 (two) times daily.   DIPHENHYDRAMINE (BENADRYL) 50 MG TABLET    Pt is also  to take 50 mg of benadryl on 09/11/22 @ 9:00 am. Please call [phone] with any questions.   DOXYCYCLINE (VIBRA-TABS) 100 MG TABLET    Take 100 mg by mouth 2 (two) times daily.   MEMANTINE (NAMENDA) 10 MG TABLET    Take 10 mg by mouth 2 (two) times daily.   METOPROLOL TARTRATE (LOPRESSOR) 25 MG TABLET    Take 25 mg by mouth 2 (two) times daily.   POLYETHYLENE GLYCOL (MIRALAX / GLYCOLAX) 17 G PACKET    Take 17 g by mouth 2 (two) times daily.   PREDNISONE (DELTASONE) 50 MG TABLET    Pt to take 50 mg of prednisone on 09/10/22 at 9:00 pm, 50 mg of prednisone on 09/11/22 @ 3:00 am, and 50 mg of prednisone on 09/11/22 @ 9:00 am. Pt is also to take 50 mg of benadryl on 09/11/22 @ 9:00 am. Please call [phone] with any questions.    Physical Exam:  Vitals:   11/19/23 1026  BP: 136/82  Pulse: 71  Temp: (!) 97.3 F (36.3 C)  SpO2: 99%  Weight: 237 lb (107.5 kg)  Height: 5\' 9"  (1.753 m)   Body mass index is 35 kg/m. Wt Readings from Last 3 Encounters:  11/19/23 237 lb (107.5 kg)  08/08/22 264 lb (119.7 kg)  07/01/22 264 lb (119.7 kg)    Physical Exam Constitutional:      Appearance: Normal appearance.  Cardiovascular:     Rate and Rhythm: Normal rate.  Pulmonary:     Effort: Pulmonary effort is normal.     Breath sounds: Normal breath sounds. No wheezing or rales.  Neurological:     Mental Status: She is alert. Mental status  is at baseline.  Psychiatric:        Mood and Affect: Mood normal.     Labs reviewed: Basic Metabolic Panel: No results for input(s): "NA", "K", "CL", "CO2", "GLUCOSE", "BUN", "CREATININE", "CALCIUM", "MG", "PHOS", "TSH" in the last 8760 hours. Liver Function Tests: No results for input(s): "AST", "ALT", "ALKPHOS", "BILITOT", "PROT", "ALBUMIN" in the last 8760 hours. No results for input(s): "LIPASE", "AMYLASE" in the last 8760 hours. No results for input(s): "AMMONIA" in the last 8760 hours. CBC: No results for input(s): "WBC", "NEUTROABS", "HGB",  "HCT", "MCV", "PLT" in the last 8760 hours. Lipid Panel: No results for input(s): "CHOL", "HDL", "LDLCALC", "TRIG", "CHOLHDL", "LDLDIRECT" in the last 8760 hours. TSH: No results for input(s): "TSH" in the last 8760 hours. A1C: Lab Results  Component Value Date   HGBA1C 6.3 (H) 05/03/2019     Assessment/Plan  Chronic Bronchitis Patient has been weaned off oxygen therapy over the past few months and has not used it in the last month. Oxygen saturation is 99% at rest and she only experiences shortness of breath during physical therapy or increase physical activity.  -Discontinue home oxygen therapy as it is no longer needed. -Write a letter to the oxygen supply company to discontinue service and pick up the oxygen tank. -Advise patient to follow up with the ongoing prescriber for further documentation if needed.     Janene Harvey. Biagio Borg  [facility] [phone]
